# Patient Record
Sex: Male | Born: 1935 | Race: White | Hispanic: No | Smoking: Former smoker
Health system: Southern US, Community
[De-identification: ages and names within clinical notes are randomized; demographics above are authoritative.]

## PROBLEM LIST (undated history)

## (undated) DIAGNOSIS — H409 Unspecified glaucoma: Secondary | ICD-10-CM

## (undated) DIAGNOSIS — N189 Chronic kidney disease, unspecified: Secondary | ICD-10-CM

## (undated) DIAGNOSIS — E119 Type 2 diabetes mellitus without complications: Secondary | ICD-10-CM

## (undated) DIAGNOSIS — I639 Cerebral infarction, unspecified: Secondary | ICD-10-CM

## (undated) DIAGNOSIS — H5440 Blindness, one eye, unspecified eye: Secondary | ICD-10-CM

## (undated) DIAGNOSIS — G629 Polyneuropathy, unspecified: Secondary | ICD-10-CM

## (undated) DIAGNOSIS — H269 Unspecified cataract: Secondary | ICD-10-CM

## (undated) DIAGNOSIS — I10 Essential (primary) hypertension: Secondary | ICD-10-CM

## (undated) DIAGNOSIS — C642 Malignant neoplasm of left kidney, except renal pelvis: Secondary | ICD-10-CM

## (undated) DIAGNOSIS — M1990 Unspecified osteoarthritis, unspecified site: Secondary | ICD-10-CM

## (undated) DIAGNOSIS — K5900 Constipation, unspecified: Secondary | ICD-10-CM

## (undated) HISTORY — PX: THYROID SURGERY: SHX805

## (undated) HISTORY — PX: CHOLECYSTECTOMY: SHX55

## (undated) HISTORY — PX: TONSILLECTOMY: SUR1361

---

## 1981-01-27 DIAGNOSIS — C73 Malignant neoplasm of thyroid gland: Secondary | ICD-10-CM | POA: Insufficient documentation

## 1998-09-11 DIAGNOSIS — E7800 Pure hypercholesterolemia, unspecified: Secondary | ICD-10-CM | POA: Insufficient documentation

## 1999-01-28 DIAGNOSIS — K635 Polyp of colon: Secondary | ICD-10-CM | POA: Insufficient documentation

## 2003-07-19 DIAGNOSIS — E1141 Type 2 diabetes mellitus with diabetic mononeuropathy: Secondary | ICD-10-CM | POA: Insufficient documentation

## 2005-07-09 DIAGNOSIS — E291 Testicular hypofunction: Secondary | ICD-10-CM | POA: Insufficient documentation

## 2006-06-17 DIAGNOSIS — E559 Vitamin D deficiency, unspecified: Secondary | ICD-10-CM | POA: Insufficient documentation

## 2011-07-29 DIAGNOSIS — H269 Unspecified cataract: Secondary | ICD-10-CM | POA: Insufficient documentation

## 2011-07-29 DIAGNOSIS — H401190 Primary open-angle glaucoma, unspecified eye, stage unspecified: Secondary | ICD-10-CM | POA: Insufficient documentation

## 2015-07-28 DIAGNOSIS — D099 Carcinoma in situ, unspecified: Secondary | ICD-10-CM | POA: Insufficient documentation

## 2016-03-21 DIAGNOSIS — I5020 Unspecified systolic (congestive) heart failure: Secondary | ICD-10-CM | POA: Insufficient documentation

## 2018-07-22 DIAGNOSIS — N400 Enlarged prostate without lower urinary tract symptoms: Secondary | ICD-10-CM | POA: Insufficient documentation

## 2018-07-22 DIAGNOSIS — N2889 Other specified disorders of kidney and ureter: Secondary | ICD-10-CM | POA: Insufficient documentation

## 2018-07-22 DIAGNOSIS — I129 Hypertensive chronic kidney disease with stage 1 through stage 4 chronic kidney disease, or unspecified chronic kidney disease: Secondary | ICD-10-CM | POA: Insufficient documentation

## 2019-08-12 ENCOUNTER — Inpatient Hospital Stay (HOSPITAL_COMMUNITY)
Admission: EM | Admit: 2019-08-12 | Discharge: 2019-08-22 | DRG: 493 | Disposition: A | Discharge status: Skilled Nursing Facility | Payer: Medicare Other | Attending: Internal Medicine | Admitting: Internal Medicine

## 2019-08-12 ENCOUNTER — Encounter (HOSPITAL_COMMUNITY): Payer: Self-pay | Admitting: Anesthesiology

## 2019-08-12 ENCOUNTER — Emergency Department (HOSPITAL_COMMUNITY): Payer: Medicare Other

## 2019-08-12 ENCOUNTER — Encounter (HOSPITAL_COMMUNITY): Payer: Self-pay

## 2019-08-12 ENCOUNTER — Encounter (HOSPITAL_COMMUNITY)
Admission: EM | Disposition: A | Discharge status: Skilled Nursing Facility | Payer: Self-pay | Source: Home / Self Care | Attending: Internal Medicine

## 2019-08-12 ENCOUNTER — Other Ambulatory Visit: Payer: Self-pay

## 2019-08-12 ENCOUNTER — Inpatient Hospital Stay (HOSPITAL_COMMUNITY): Payer: Medicare Other

## 2019-08-12 DIAGNOSIS — E1121 Type 2 diabetes mellitus with diabetic nephropathy: Secondary | ICD-10-CM | POA: Diagnosis not present

## 2019-08-12 DIAGNOSIS — S82891A Other fracture of right lower leg, initial encounter for closed fracture: Secondary | ICD-10-CM | POA: Diagnosis not present

## 2019-08-12 DIAGNOSIS — Z85528 Personal history of other malignant neoplasm of kidney: Secondary | ICD-10-CM | POA: Diagnosis not present

## 2019-08-12 DIAGNOSIS — H409 Unspecified glaucoma: Secondary | ICD-10-CM | POA: Diagnosis present

## 2019-08-12 DIAGNOSIS — E1129 Type 2 diabetes mellitus with other diabetic kidney complication: Secondary | ICD-10-CM | POA: Diagnosis present

## 2019-08-12 DIAGNOSIS — S82841A Displaced bimalleolar fracture of right lower leg, initial encounter for closed fracture: Secondary | ICD-10-CM | POA: Diagnosis present

## 2019-08-12 DIAGNOSIS — Z833 Family history of diabetes mellitus: Secondary | ICD-10-CM | POA: Diagnosis not present

## 2019-08-12 DIAGNOSIS — Z20822 Contact with and (suspected) exposure to covid-19: Secondary | ICD-10-CM | POA: Diagnosis present

## 2019-08-12 DIAGNOSIS — Z794 Long term (current) use of insulin: Secondary | ICD-10-CM

## 2019-08-12 DIAGNOSIS — E0841 Diabetes mellitus due to underlying condition with diabetic mononeuropathy: Secondary | ICD-10-CM | POA: Diagnosis not present

## 2019-08-12 DIAGNOSIS — Z01818 Encounter for other preprocedural examination: Secondary | ICD-10-CM | POA: Diagnosis present

## 2019-08-12 DIAGNOSIS — D649 Anemia, unspecified: Secondary | ICD-10-CM | POA: Diagnosis present

## 2019-08-12 DIAGNOSIS — E1140 Type 2 diabetes mellitus with diabetic neuropathy, unspecified: Secondary | ICD-10-CM | POA: Diagnosis present

## 2019-08-12 DIAGNOSIS — I129 Hypertensive chronic kidney disease with stage 1 through stage 4 chronic kidney disease, or unspecified chronic kidney disease: Secondary | ICD-10-CM | POA: Diagnosis present

## 2019-08-12 DIAGNOSIS — E11649 Type 2 diabetes mellitus with hypoglycemia without coma: Secondary | ICD-10-CM | POA: Diagnosis not present

## 2019-08-12 DIAGNOSIS — Z87891 Personal history of nicotine dependence: Secondary | ICD-10-CM

## 2019-08-12 DIAGNOSIS — E1122 Type 2 diabetes mellitus with diabetic chronic kidney disease: Secondary | ICD-10-CM | POA: Diagnosis present

## 2019-08-12 DIAGNOSIS — Z8673 Personal history of transient ischemic attack (TIA), and cerebral infarction without residual deficits: Secondary | ICD-10-CM

## 2019-08-12 DIAGNOSIS — S82899A Other fracture of unspecified lower leg, initial encounter for closed fracture: Secondary | ICD-10-CM | POA: Diagnosis present

## 2019-08-12 DIAGNOSIS — N184 Chronic kidney disease, stage 4 (severe): Secondary | ICD-10-CM | POA: Diagnosis present

## 2019-08-12 DIAGNOSIS — I10 Essential (primary) hypertension: Secondary | ICD-10-CM | POA: Diagnosis not present

## 2019-08-12 DIAGNOSIS — R001 Bradycardia, unspecified: Secondary | ICD-10-CM | POA: Diagnosis present

## 2019-08-12 DIAGNOSIS — Y92009 Unspecified place in unspecified non-institutional (private) residence as the place of occurrence of the external cause: Secondary | ICD-10-CM | POA: Diagnosis not present

## 2019-08-12 DIAGNOSIS — W010XXA Fall on same level from slipping, tripping and stumbling without subsequent striking against object, initial encounter: Secondary | ICD-10-CM | POA: Diagnosis present

## 2019-08-12 DIAGNOSIS — E1165 Type 2 diabetes mellitus with hyperglycemia: Secondary | ICD-10-CM | POA: Diagnosis present

## 2019-08-12 DIAGNOSIS — K5900 Constipation, unspecified: Secondary | ICD-10-CM | POA: Diagnosis present

## 2019-08-12 DIAGNOSIS — Z79899 Other long term (current) drug therapy: Secondary | ICD-10-CM

## 2019-08-12 HISTORY — DX: Essential (primary) hypertension: I10

## 2019-08-12 HISTORY — DX: Unspecified glaucoma: H40.9

## 2019-08-12 HISTORY — DX: Constipation, unspecified: K59.00

## 2019-08-12 HISTORY — DX: Cerebral infarction, unspecified: I63.9

## 2019-08-12 HISTORY — DX: Blindness, one eye, unspecified eye: H54.40

## 2019-08-12 HISTORY — DX: Unspecified cataract: H26.9

## 2019-08-12 HISTORY — DX: Malignant neoplasm of left kidney, except renal pelvis: C64.2

## 2019-08-12 HISTORY — DX: Chronic kidney disease, unspecified: N18.9

## 2019-08-12 HISTORY — DX: Unspecified osteoarthritis, unspecified site: M19.90

## 2019-08-12 HISTORY — DX: Type 2 diabetes mellitus without complications: E11.9

## 2019-08-12 HISTORY — DX: Polyneuropathy, unspecified: G62.9

## 2019-08-12 LAB — SARS CORONAVIRUS 2 BY RT PCR (HOSPITAL ORDER, PERFORMED IN ~~LOC~~ HOSPITAL LAB): SARS Coronavirus 2: NEGATIVE

## 2019-08-12 LAB — BASIC METABOLIC PANEL
Anion gap: 8 (ref 5–15)
BUN: 43 mg/dL — ABNORMAL HIGH (ref 8–23)
CO2: 22 mmol/L (ref 22–32)
Calcium: 9.3 mg/dL (ref 8.9–10.3)
Chloride: 107 mmol/L (ref 98–111)
Creatinine, Ser: 1.95 mg/dL — ABNORMAL HIGH (ref 0.61–1.24)
GFR calc Af Amer: 34 mL/min — ABNORMAL LOW (ref >60)
GFR calc non Af Amer: 29 mL/min — ABNORMAL LOW (ref >60)
Glucose, Bld: 200 mg/dL — ABNORMAL HIGH (ref 70–99)
Potassium: 4.7 mmol/L (ref 3.5–5.1)
Sodium: 137 mmol/L (ref 135–145)

## 2019-08-12 LAB — CBC
HCT: 35.6 % — ABNORMAL LOW (ref 39.0–52.0)
Hemoglobin: 12.5 g/dL — ABNORMAL LOW (ref 13.0–17.0)
MCH: 32 pg (ref 26.0–34.0)
MCHC: 35.1 g/dL (ref 30.0–36.0)
MCV: 91 fL (ref 80.0–100.0)
Platelets: 189 10*3/uL (ref 150–400)
RBC: 3.91 MIL/uL — ABNORMAL LOW (ref 4.22–5.81)
RDW: 14.1 % (ref 11.5–15.5)
WBC: 11.2 10*3/uL — ABNORMAL HIGH (ref 4.0–10.5)
nRBC: 0 % (ref 0.0–0.2)

## 2019-08-12 LAB — GLUCOSE, CAPILLARY
Glucose-Capillary: 57 mg/dL — ABNORMAL LOW (ref 70–99)
Glucose-Capillary: 122 mg/dL — ABNORMAL HIGH (ref 70–99)
Glucose-Capillary: 192 mg/dL — ABNORMAL HIGH (ref 70–99)

## 2019-08-12 LAB — CBG MONITORING, ED
Glucose-Capillary: 122 mg/dL — ABNORMAL HIGH (ref 70–99)
Glucose-Capillary: 84 mg/dL (ref 70–99)

## 2019-08-12 IMAGING — DX DG CHEST 1V PORT
2 series · 2 of 2 positions shown · non-contrast
Comparison: None.

CLINICAL DATA: Preop ankle surgery

EXAM:
PORTABLE CHEST 1 VIEW

[chest ap (1 of 2)]
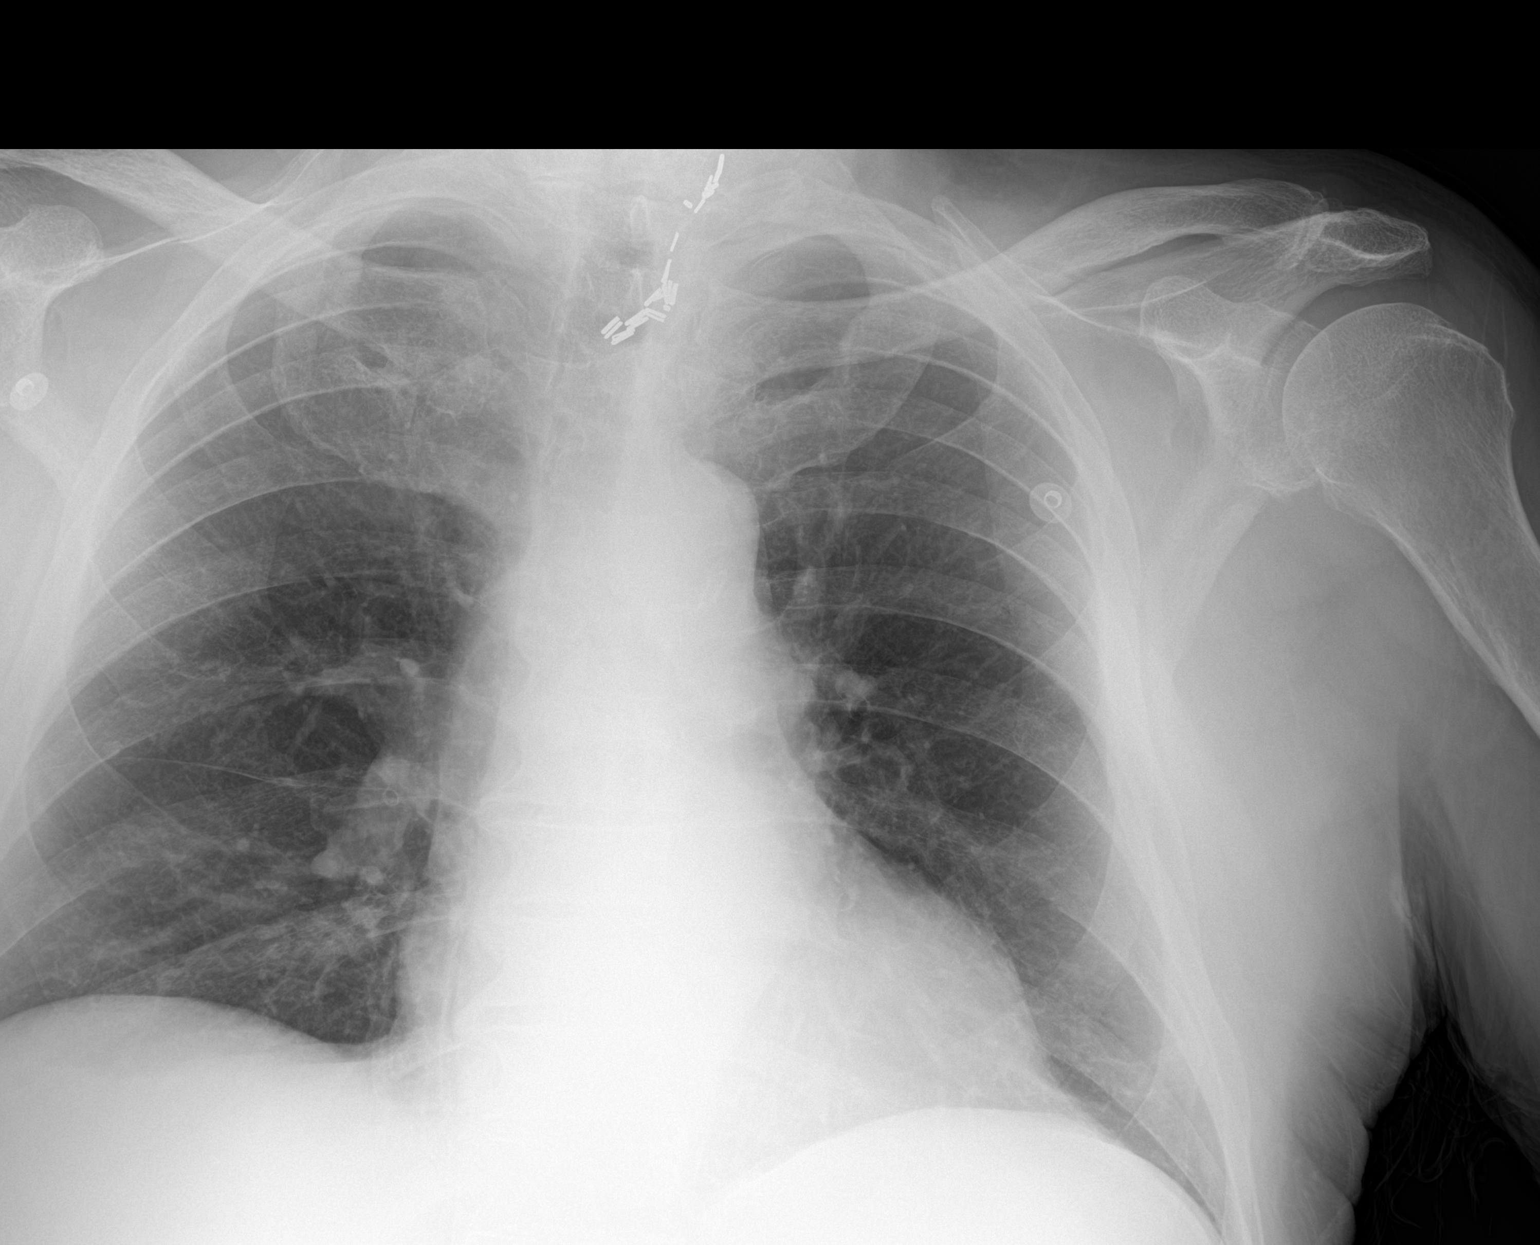

[chest ap (2 of 2)]
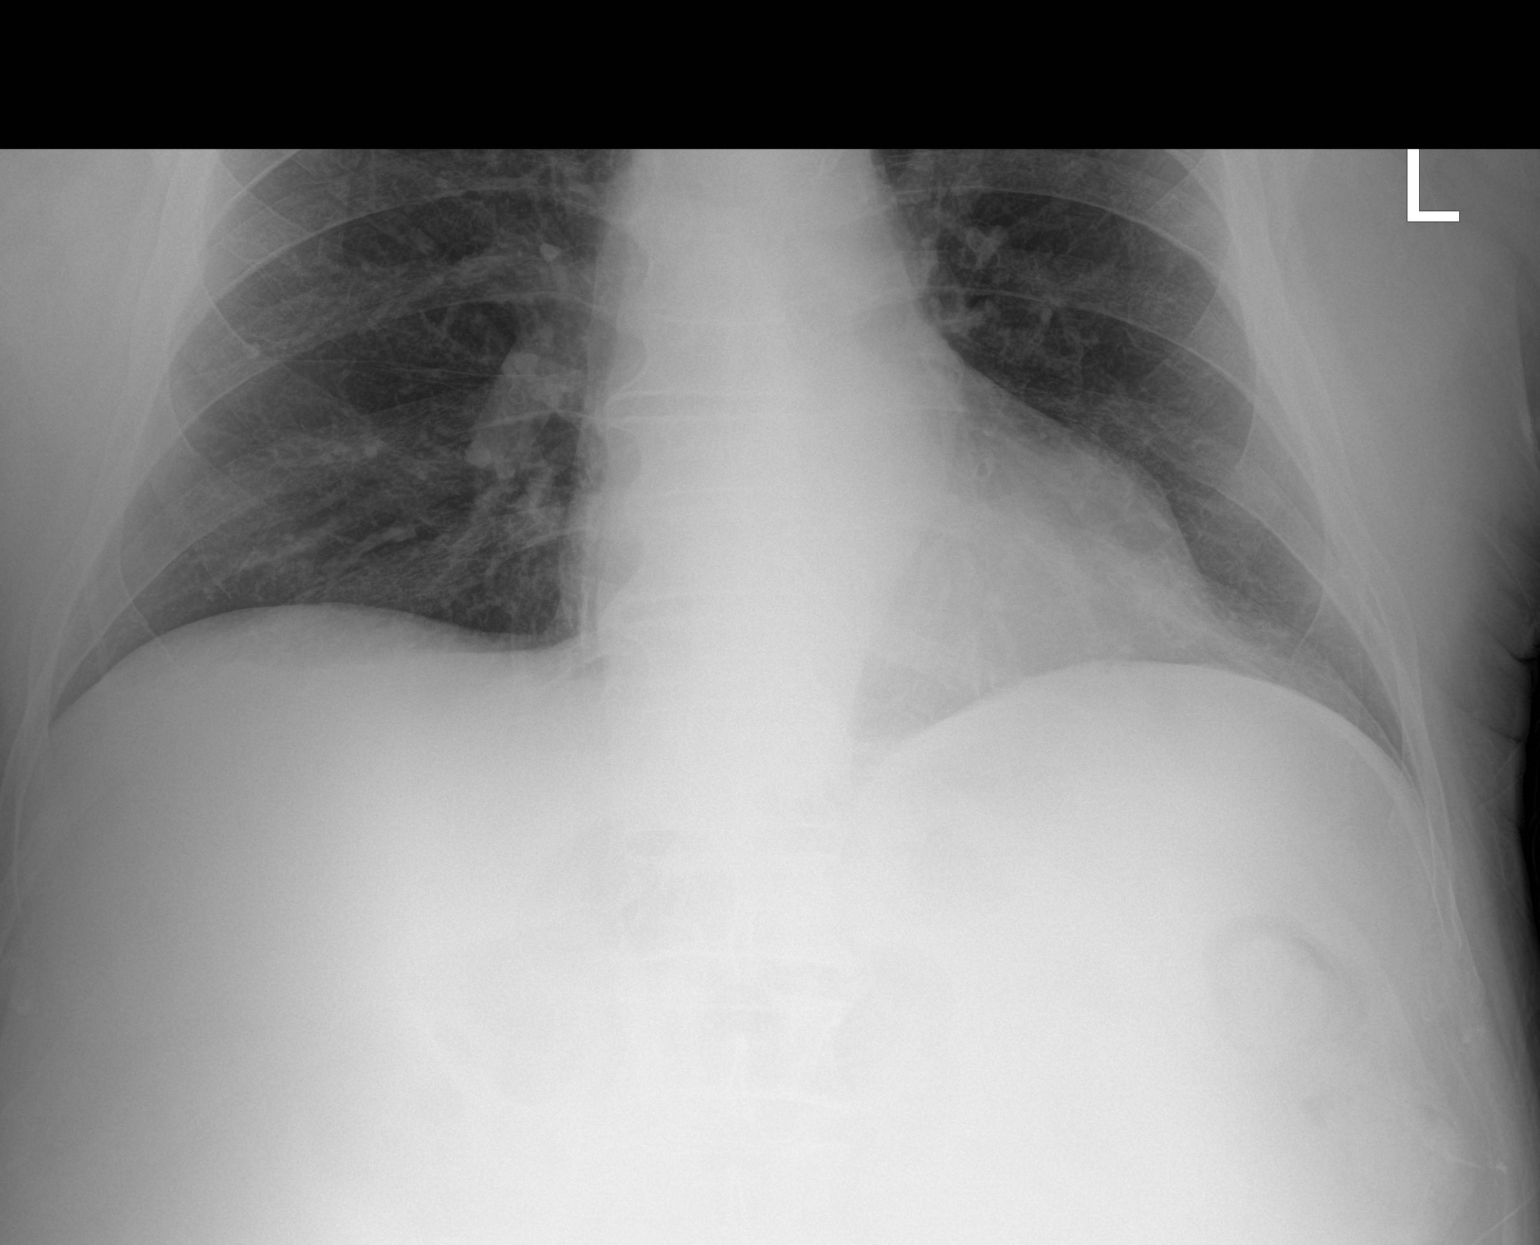

[2 of 2 positions shown; findings below may reference images not displayed]

FINDINGS: Mild left basilar scarring/atelectasis. Lungs are otherwise clear.
No pleural effusion or pneumothorax.

Heart is normal in size.

Surgical clips along the left neck.
IMPRESSION: No evidence of acute cardiopulmonary disease.

## 2019-08-12 IMAGING — CR DG ANKLE COMPLETE 3+V*R*
3 series · 3 of 3 positions shown · non-contrast
Comparison: None.

CLINICAL DATA: [AGE] male status post fall with pain.

EXAM:
RIGHT ANKLE - COMPLETE 3+ VIEW

[x ankle ap right]
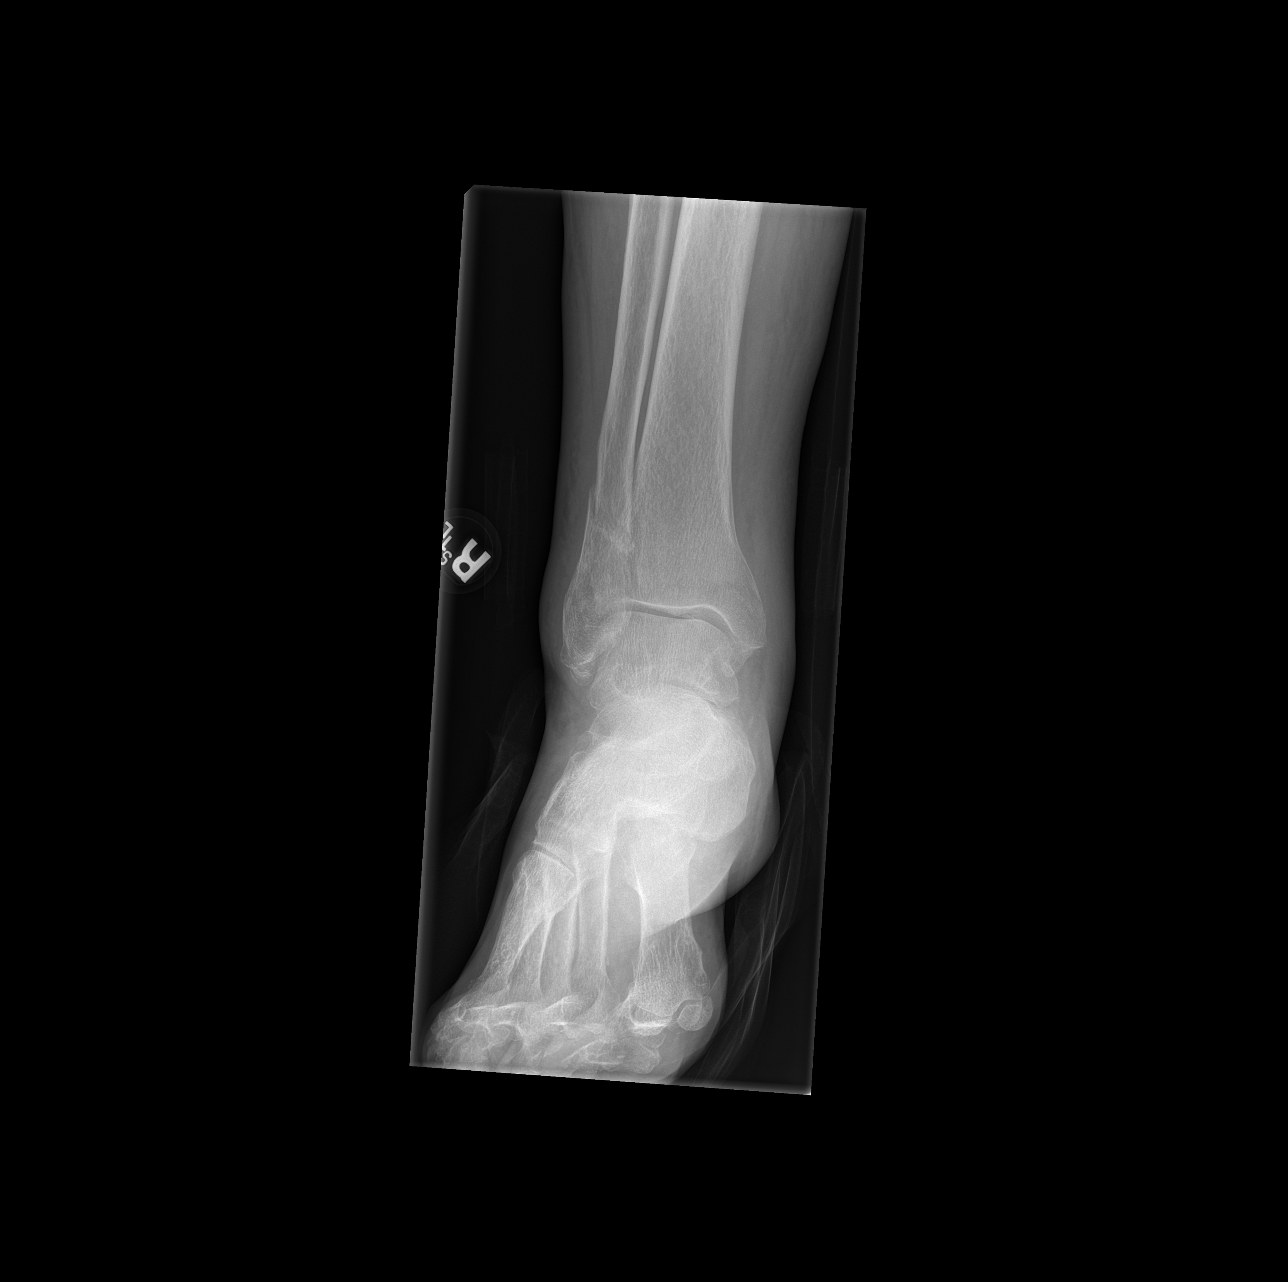

[x ankle obl right]
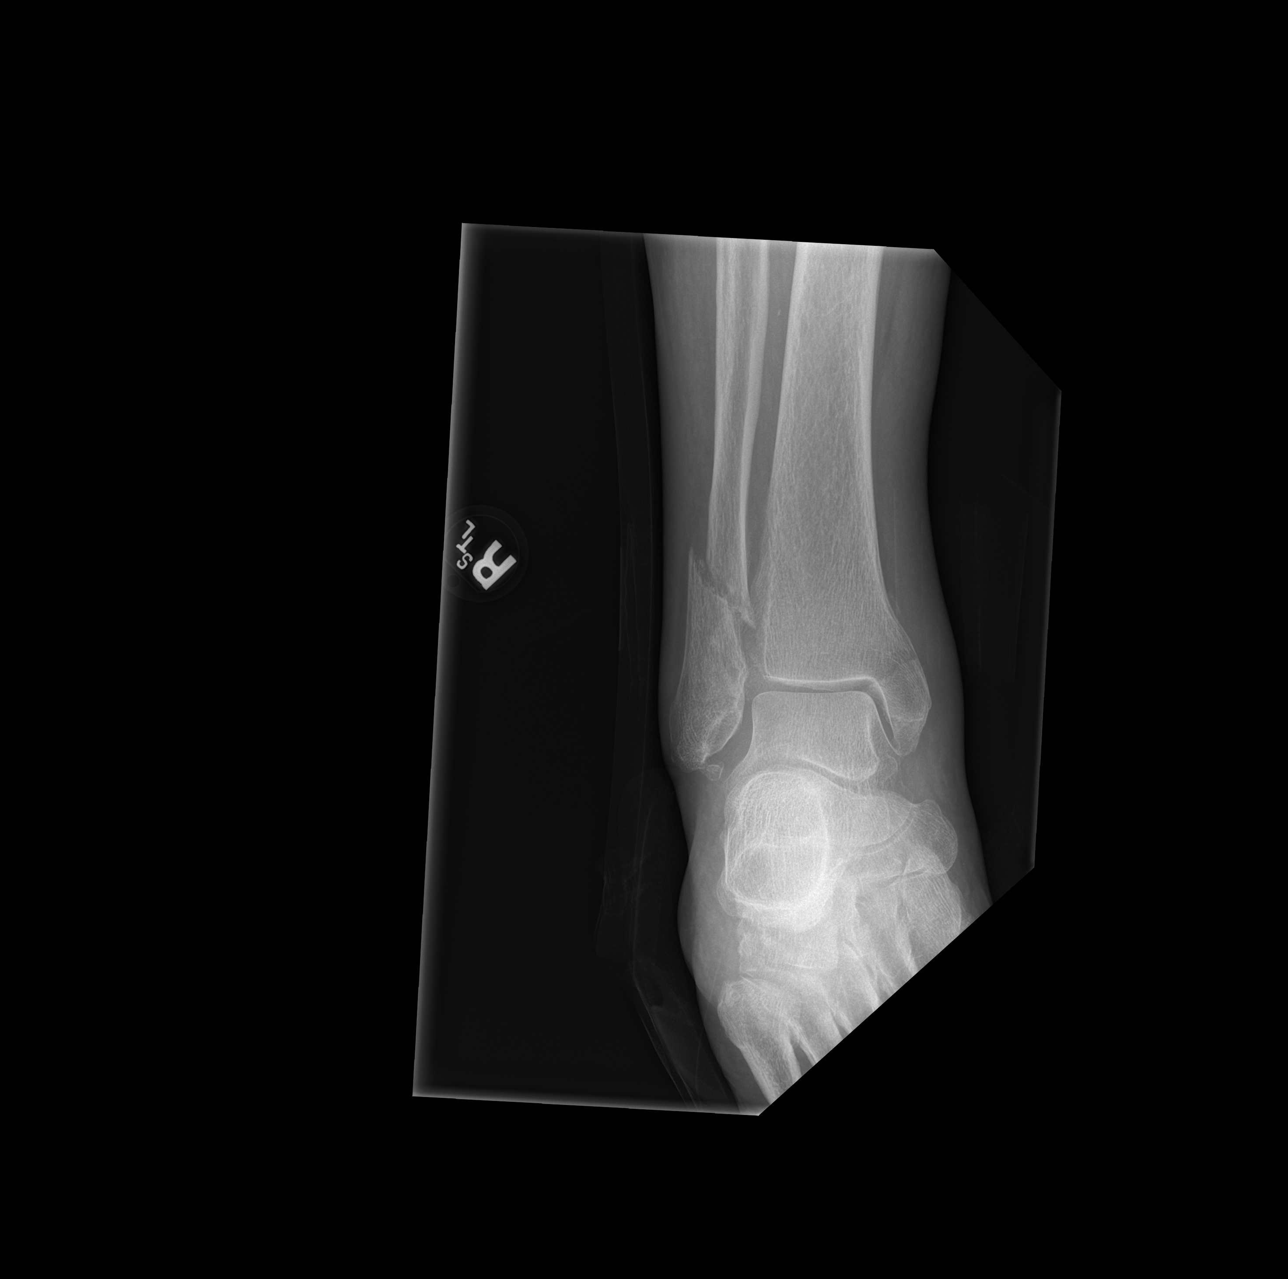

[x ankle lat right]
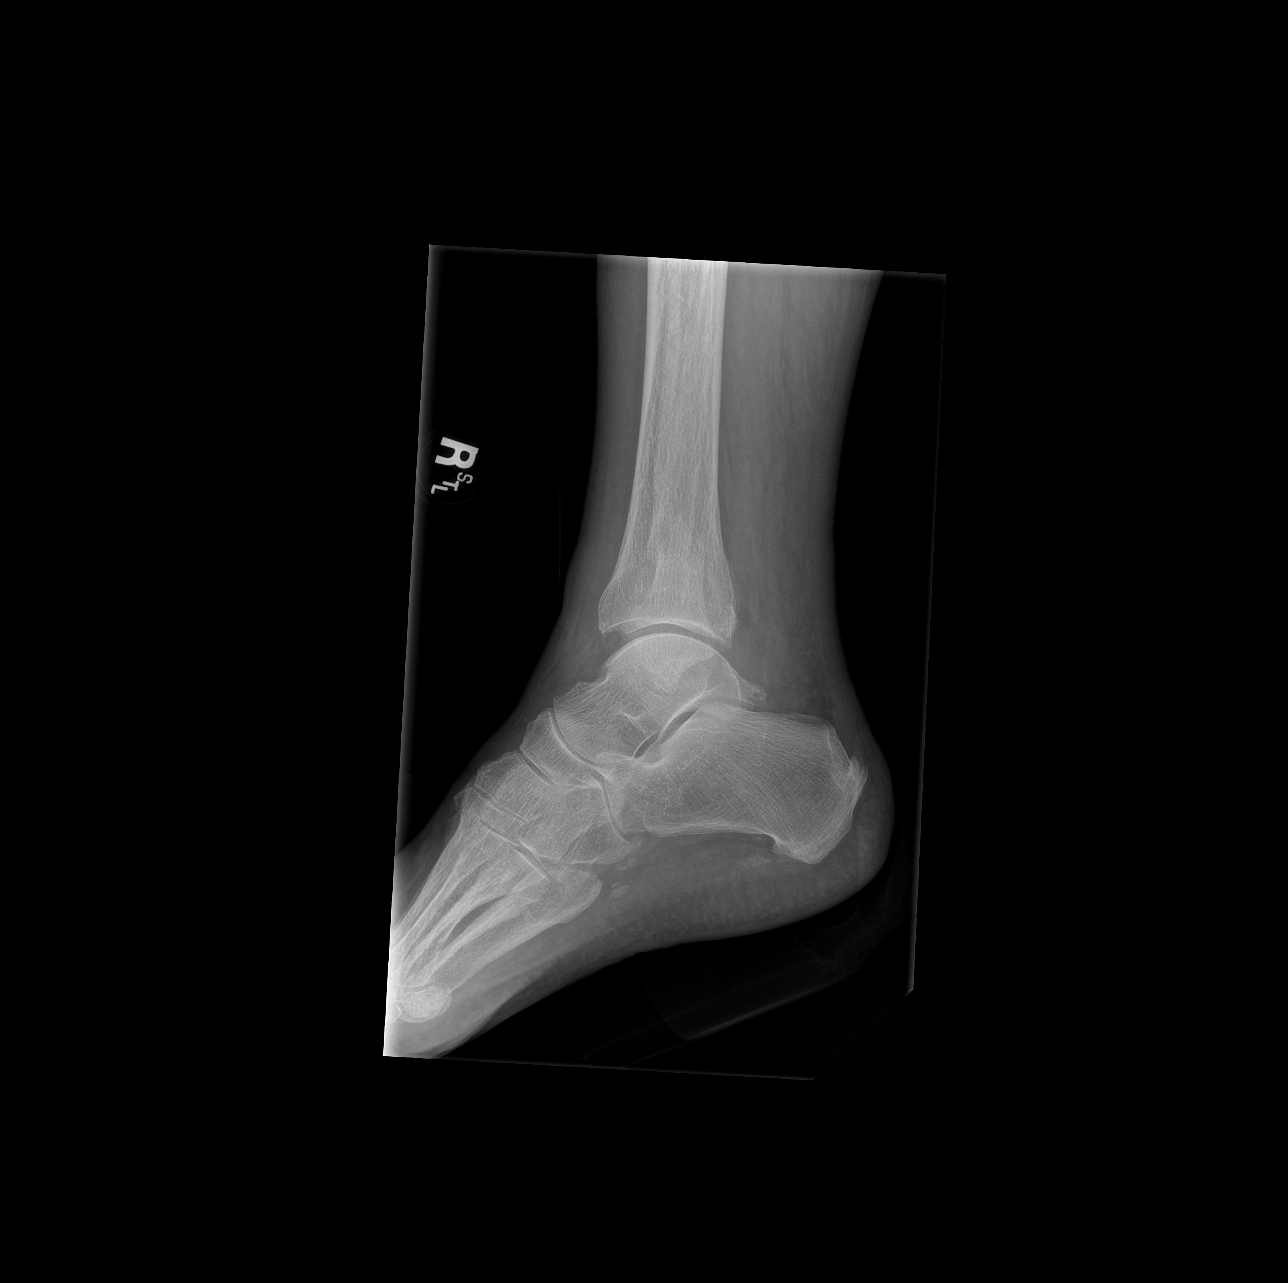

[3 of 3 positions shown; findings below may reference images not displayed]

FINDINGS: Oblique mildly comminuted fracture through the distal right fibula
metadiaphysis. Mild posterior and lateral displacement of the
fibula. Widening of the right lateral mortise joint space, as well
as the tibia fibular syndesmosis.

Superimposed more linear fracture through the medial malleolus,
nondisplaced. The posterior malleolus appears to remain intact.
Talar dome intact. Calcaneus and other visible bones of the right
foot appear intact.
IMPRESSION: 1. Mildly displaced and comminuted fracture of the distal right
fibula metadiaphysis with suspicion of injury to the tibiofibular
syndesmosis.
2. Nondisplaced fracture through the medial malleolus.

## 2019-08-12 SURGERY — OPEN REDUCTION INTERNAL FIXATION (ORIF) ANKLE FRACTURE
Anesthesia: General | Site: Ankle | Laterality: Right

## 2019-08-12 MED ORDER — OXYCODONE-ACETAMINOPHEN 5-325 MG PO TABS
1.0000 | ORAL_TABLET | Freq: Once | ORAL | Status: AC
  Administered 2019-08-12: 1 via ORAL
  Filled 2019-08-12: qty 1

## 2019-08-12 MED ORDER — ATORVASTATIN CALCIUM 80 MG PO TABS
80.0000 mg | ORAL_TABLET | Freq: Every day | ORAL | Status: DC
  Administered 2019-08-12 – 2019-08-21 (×10): 80 mg via ORAL
  Filled 2019-08-12 (×8): qty 1
  Filled 2019-08-12: qty 2
  Filled 2019-08-12: qty 1

## 2019-08-12 MED ORDER — FAMOTIDINE 20 MG PO TABS
20.0000 mg | ORAL_TABLET | Freq: Every day | ORAL | Status: DC
  Administered 2019-08-12 – 2019-08-21 (×10): 20 mg via ORAL
  Filled 2019-08-12 (×10): qty 1

## 2019-08-12 MED ORDER — SENNA 8.6 MG PO TABS
1.0000 | ORAL_TABLET | Freq: Every day | ORAL | Status: DC
  Administered 2019-08-12 – 2019-08-21 (×8): 8.6 mg via ORAL
  Filled 2019-08-12 (×10): qty 1

## 2019-08-12 MED ORDER — INSULIN ASPART 100 UNIT/ML ~~LOC~~ SOLN
0.0000 [IU] | SUBCUTANEOUS | Status: DC
  Administered 2019-08-12: 2 [IU] via SUBCUTANEOUS
  Administered 2019-08-13 (×2): 9 [IU] via SUBCUTANEOUS
  Administered 2019-08-13: 2 [IU] via SUBCUTANEOUS
  Administered 2019-08-13: 1 [IU] via SUBCUTANEOUS
  Administered 2019-08-13: 3 [IU] via SUBCUTANEOUS
  Administered 2019-08-14: 2 [IU] via SUBCUTANEOUS
  Administered 2019-08-14: 5 [IU] via SUBCUTANEOUS
  Administered 2019-08-14: 3 [IU] via SUBCUTANEOUS
  Administered 2019-08-14: 5 [IU] via SUBCUTANEOUS
  Administered 2019-08-14: 1 [IU] via SUBCUTANEOUS
  Administered 2019-08-14: 5 [IU] via SUBCUTANEOUS
  Administered 2019-08-15 (×3): 3 [IU] via SUBCUTANEOUS
  Administered 2019-08-15: 2 [IU] via SUBCUTANEOUS
  Administered 2019-08-15: 1 [IU] via SUBCUTANEOUS
  Administered 2019-08-16: 3 [IU] via SUBCUTANEOUS
  Administered 2019-08-16: 1 [IU] via SUBCUTANEOUS
  Administered 2019-08-16: 5 [IU] via SUBCUTANEOUS
  Administered 2019-08-16 – 2019-08-17 (×3): 2 [IU] via SUBCUTANEOUS
  Administered 2019-08-17: 5 [IU] via SUBCUTANEOUS
  Administered 2019-08-17: 1 [IU] via SUBCUTANEOUS
  Administered 2019-08-17 (×2): 5 [IU] via SUBCUTANEOUS
  Administered 2019-08-18: 3 [IU] via SUBCUTANEOUS
  Administered 2019-08-18: 1 [IU] via SUBCUTANEOUS
  Administered 2019-08-18 (×2): 3 [IU] via SUBCUTANEOUS
  Administered 2019-08-18: 2 [IU] via SUBCUTANEOUS
  Administered 2019-08-18: 5 [IU] via SUBCUTANEOUS
  Administered 2019-08-19: 2 [IU] via SUBCUTANEOUS
  Administered 2019-08-19: 3 [IU] via SUBCUTANEOUS
  Administered 2019-08-19: 2 [IU] via SUBCUTANEOUS
  Administered 2019-08-19: 5 [IU] via SUBCUTANEOUS
  Administered 2019-08-19: 2 [IU] via SUBCUTANEOUS
  Administered 2019-08-20: 3 [IU] via SUBCUTANEOUS
  Administered 2019-08-20: 1 [IU] via SUBCUTANEOUS
  Administered 2019-08-20 (×4): 2 [IU] via SUBCUTANEOUS
  Administered 2019-08-21 (×2): 3 [IU] via SUBCUTANEOUS
  Administered 2019-08-21: 2 [IU] via SUBCUTANEOUS
  Administered 2019-08-21 (×2): 3 [IU] via SUBCUTANEOUS
  Administered 2019-08-22 (×2): 2 [IU] via SUBCUTANEOUS
  Administered 2019-08-22: 1 [IU] via SUBCUTANEOUS
  Administered 2019-08-22: 3 [IU] via SUBCUTANEOUS
  Filled 2019-08-12: qty 0.09

## 2019-08-12 MED ORDER — ONDANSETRON HCL 4 MG/2ML IJ SOLN
4.0000 mg | Freq: Once | INTRAMUSCULAR | Status: AC
  Administered 2019-08-12: 4 mg via INTRAVENOUS
  Filled 2019-08-12: qty 2

## 2019-08-12 MED ORDER — LEVOTHYROXINE SODIUM 25 MCG PO TABS
125.0000 ug | ORAL_TABLET | Freq: Every day | ORAL | Status: DC
  Administered 2019-08-13 – 2019-08-22 (×10): 125 ug via ORAL
  Filled 2019-08-12 (×10): qty 1

## 2019-08-12 MED ORDER — TIMOLOL MALEATE 0.5 % OP SOLN
1.0000 [drp] | Freq: Two times a day (BID) | OPHTHALMIC | Status: DC
  Administered 2019-08-12 – 2019-08-22 (×19): 1 [drp] via OPHTHALMIC
  Filled 2019-08-12: qty 5

## 2019-08-12 MED ORDER — MORPHINE SULFATE (PF) 2 MG/ML IV SOLN
0.5000 mg | INTRAVENOUS | Status: DC | PRN
  Administered 2019-08-12 (×3): 0.5 mg via INTRAVENOUS
  Filled 2019-08-12 (×3): qty 1

## 2019-08-12 MED ORDER — DOCUSATE SODIUM 100 MG PO CAPS
100.0000 mg | ORAL_CAPSULE | Freq: Two times a day (BID) | ORAL | Status: DC
  Administered 2019-08-12: 100 mg via ORAL
  Filled 2019-08-12: qty 1

## 2019-08-12 MED ORDER — AMLODIPINE BESYLATE 5 MG PO TABS
5.0000 mg | ORAL_TABLET | Freq: Every day | ORAL | Status: DC
  Administered 2019-08-12 – 2019-08-14 (×3): 5 mg via ORAL
  Filled 2019-08-12 (×3): qty 1

## 2019-08-12 MED ORDER — TIMOLOL HEMIHYDRATE 0.5 % OP SOLN: 1.0000 [drp] | Freq: Two times a day (BID) | OPHTHALMIC | Status: DC

## 2019-08-12 MED ORDER — HYDRALAZINE HCL 20 MG/ML IJ SOLN: 10.0000 mg | INTRAMUSCULAR | Status: DC | PRN

## 2019-08-12 MED ORDER — SODIUM CHLORIDE 0.9 % IV SOLN: INTRAVENOUS | Status: DC

## 2019-08-12 MED ORDER — HYDROCODONE-ACETAMINOPHEN 5-325 MG PO TABS
1.0000 | ORAL_TABLET | Freq: Four times a day (QID) | ORAL | Status: DC | PRN
  Administered 2019-08-12 – 2019-08-21 (×19): 1 via ORAL
  Filled 2019-08-12 (×20): qty 1

## 2019-08-12 NOTE — ED Triage Notes (Signed)
Pt BIB [facility] for a mechanical fall where he sustained a R ankle injury. R foot and ankle splinted by EMS. R ankle swollen.They gave 50 mcg of fentanyl and he became hypotensive. Received 579mL of NS. They gave the other half of fentanyl while they were checking in. Also received 4 mg of zofran. 20g in L forearm.

## 2019-08-12 NOTE — ED Provider Notes (Signed)
TIME SEEN: 4:24 AM  CHIEF COMPLAINT: Right ankle injury  HPI: Patient is a 86 year old male with unknown past medical history who presents to the emergency department by EMS after he slipped and fell on carpeted floor just prior to arrival.  States he just moved to this area and moved into a new home and the carpet is very slick.  He states that he slipped injuring his right ankle.  He did not hit his head or lose consciousness.  Denies neck or back pain.  States he is not on blood thinners.  States he cannot remember what medical problems he has.  States he normally ambulates at home without assistance.  Does have a walker at home.   Contacted patient's daughter.  Patient has history of hypertension, diabetes, chronic kidney disease, stroke.  ROS: See HPI Constitutional: no fever  Eyes: no drainage  ENT: no runny nose   Cardiovascular:  no chest pain  Resp: no SOB  GI: no vomiting GU: no dysuria Integumentary: no rash  Allergy: no hives  Musculoskeletal: no leg swelling  Neurological: no slurred speech ROS otherwise negative  PAST MEDICAL HISTORY/PAST SURGICAL HISTORY:  Past Medical History:  Diagnosis Date  . Chronic kidney disease   . Diabetes mellitus without complication ([place])   . Hypertension   . Stroke [facility])     MEDICATIONS:  Prior to Admission medications   Not on File    ALLERGIES:  No Known Allergies  SOCIAL HISTORY:  Social History   Tobacco Use  . Smoking status: Not on file  Substance Use Topics  . Alcohol use: Not on file    FAMILY HISTORY: History reviewed. No pertinent family history.  EXAM: BP 112/63 (BP Location: Right Arm)   Pulse 63   Temp (!) 97.3 F (36.3 C) (Oral)   Resp 16   Ht 5\' 10"  (1.778 m)   Wt 100.7 kg   SpO2 96%   BMI 31.85 kg/m  CONSTITUTIONAL: Alert and oriented and responds appropriately to questions.  Appears uncomfortable.  Elderly. HEAD: Normocephalic; atraumatic EYES: Conjunctivae clear, PERRL, EOMI ENT: normal  nose; no rhinorrhea; moist mucous membranes; pharynx without lesions noted; no dental injury; no septal hematoma NECK: Supple, no meningismus, no LAD; no midline spinal tenderness, step-off or deformity; trachea midline CARD: RRR; S1 and S2 appreciated; no murmurs, no clicks, no rubs, no gallops RESP: Normal chest excursion without splinting or tachypnea; breath sounds clear and equal bilaterally; no wheezes, no rhonchi, no rales; no hypoxia or respiratory distress CHEST:  chest wall stable, no crepitus or ecchymosis or deformity, nontender to palpation; no flail chest ABD/GI: Normal bowel sounds; non-distended; soft, non-tender, no rebound, no guarding; no ecchymosis or other lesions noted PELVIS:  stable, nontender to palpation BACK:  The back appears normal and is non-tender to palpation, there is no CVA tenderness; no midline spinal tenderness, step-off or deformity EXT: Patient is tender to palpation diffusely over the right ankle with soft tissue swelling.  He is a 2+ right DP pulse.  No tenderness over the right foot or proximal tibia and fibula.  No pain over the right knee or hip.  Normal sensation throughout the right lower extremity.  Compartments in the right leg are soft. SKIN: Normal color for age and race; warm NEURO: Moves all extremities equally, no facial asymmetry, normal speech, reports normal sensation in the right lower extremity PSYCH: The patient's mood and manner are appropriate. Grooming and personal hygiene are appropriate.  MEDICAL DECISION MAKING: Patient here  after mechanical fall.  X-ray shows mildly displaced and comminuted fracture of the distal right fibula with suspicion of injury to the tibiofibular stenosis.  There is also a nondisplaced fracture through the medial malleolus.  He does not have a local orthopedist.  States he just establish care with [facility] primary care physicians.  Will discuss with orthopedics on call.  ED PROGRESS: Discussed with Dr. Griffin Basil  on-call for orthopedics.  He states patient can be admitted to the medical service and taken to the operating room in the next 1 to 2 days but would like the patient transferred to [facility].  Will discuss with hospitalist.  Patient is more comfortable with this plan then the plan to potentially go home given he would be nonweightbearing and he states he is still having significant pain and has issues with vertigo and worries about trying to get around on crutches or with a walker at home.   5:40 AM Discussed patient's case with hospitalist, Dr. Hal Hope.  I have recommended admission and patient (and family if present) agree with this plan. Admitting physician will place admission orders.   I reviewed all nursing notes, vitals, pertinent previous records and reviewed/interpreted all EKGs, lab and urine results, imaging (as available).  Labs show no significant abnormality other than creatinine of 1.95 but he has known history of chronic kidney disease.  No old for comparison.  Blood glucose here 200 but no DKA.  Patient placed in short leg posterior splint with stirrups.   SPLINT APPLICATION Date/Time: 8:82 AM Authorized by: Cyril Mourning Mahmud Keithly Consent: Verbal consent obtained. Risks and benefits: risks, benefits and alternatives were discussed Consent given by: patient Splint applied by: orthopedic technician Location details: Right lower extremity Splint type: Posterior short leg splint with stirrups Supplies used: Ortho-Glass, Ace wrap, padding Post-procedure: The splinted body part was neurovascularly unchanged following the procedure. Patient tolerance: Patient tolerated the procedure well with no immediate complications.    Aloysius Heinle Langdon was evaluated in Emergency Department on 08/12/2019 for the symptoms described in the history of present illness. He was evaluated in the context of the global COVID-19 pandemic, which necessitated consideration that the patient might be at risk for  infection with the SARS-CoV-2 virus that causes COVID-19. Institutional protocols and algorithms that pertain to the evaluation of patients at risk for COVID-19 are in a state of rapid change based on information released by regulatory bodies including the CDC and federal and state organizations. These policies and algorithms were followed during the patient's care in the ED.       Reinhold Rickey, Delice Bison, DO 08/12/19 [phone]

## 2019-08-12 NOTE — ED Notes (Signed)
[facility] contacted regarding patient transport.

## 2019-08-12 NOTE — ED Notes (Signed)
Ortho tech called from [facility].

## 2019-08-12 NOTE — H&P (Signed)
History and Physical    Martin Richardson ZSW:[id number] DOB: 01-09-1926 DOA: 08/12/2019  PCP: [place], [place]  Patient coming from: Home.  Chief Complaint: Fall.  HPI: Martin Richardson is a 86 y.o. male with history of diabetes mellitus type 2, chronic kidney disease stage IV, anemia, hypertension history of stroke had a fall at home when he was walking to the bathroom.  Patient states he tripped on a rug and fell onto the floor.  Did not hit his head or lose consciousness.  Denies any chest pain or shortness of breath.  Patient just recently moved from [place].  ED Course: In the ER x-rays revealed right ankle fracture.  On-call orthopedic surgeon Dr. Griffin Basil was consulted who requested transfer to [facility] for likely surgical management.  Labs are remarkable for creatinine of 1.9 blood glucose 200 hemoglobin 12.5 WBC 9.2.  Review of Systems: As per HPI, rest all negative.   Past Medical History:  Diagnosis Date  . Chronic kidney disease   . Diabetes mellitus without complication ([place])   . Hypertension   . Stroke [facility])     Past Surgical History:  Procedure Laterality Date  . CHOLECYSTECTOMY    . THYROID SURGERY       reports that he has quit smoking. He has quit using smokeless tobacco. He reports that he does not drink alcohol and does not use drugs.  No Known Allergies  Family History  Problem Relation Age of Onset  . Diabetes Mellitus II Mother     Prior to Admission medications   Not on File    Physical Exam: Constitutional: Moderately built and nourished. Vitals:   08/12/19 0328  BP: 112/63  Pulse: 63  Resp: 16  Temp: (!) 97.3 F (36.3 C)  TempSrc: Oral  SpO2: 96%  Weight: 100.7 kg  Height: 5\' 10"  (1.778 m)   Eyes: Anicteric no pallor. ENMT: No discharge from the ears eyes nose or mouth. Neck: No mass felt.  No neck rigidity. Respiratory: No rhonchi or crepitations. Cardiovascular: S1-S2 heard. Abdomen: Soft nontender bowel  sounds present. Musculoskeletal: Right leg is in splint. Skin: No rash. Neurologic: Alert awake oriented to time place and person.  Moves all extremities. Psychiatric: Appears normal but normal affect.   Labs on Admission: I have personally reviewed following labs and imaging studies  CBC: Recent Labs  Lab 08/12/19 0540  WBC 11.2*  HGB 12.5*  HCT 35.6*  MCV 91.0  PLT 322   Basic Metabolic Panel: Recent Labs  Lab 08/12/19 0540  NA 137  K 4.7  CL 107  CO2 22  GLUCOSE 200*  BUN 43*  CREATININE 1.95*  CALCIUM 9.3   GFR: Estimated Creatinine Clearance: 28.8 mL/min (A) (by C-G formula based on SCr of 1.95 mg/dL (H)). Liver Function Tests: No results for input(s): AST, ALT, ALKPHOS, BILITOT, PROT, ALBUMIN in the last 168 hours. No results for input(s): LIPASE, AMYLASE in the last 168 hours. No results for input(s): AMMONIA in the last 168 hours. Coagulation Profile: No results for input(s): INR, PROTIME in the last 168 hours. Cardiac Enzymes: No results for input(s): CKTOTAL, CKMB, CKMBINDEX, TROPONINI in the last 168 hours. BNP (last 3 results) No results for input(s): PROBNP in the last 8760 hours. HbA1C: No results for input(s): HGBA1C in the last 72 hours. CBG: No results for input(s): GLUCAP in the last 168 hours. Lipid Profile: No results for input(s): CHOL, HDL, LDLCALC, TRIG, CHOLHDL, LDLDIRECT in the last 72  hours. Thyroid Function Tests: No results for input(s): TSH, T4TOTAL, FREET4, T3FREE, THYROIDAB in the last 72 hours. Anemia Panel: No results for input(s): VITAMINB12, FOLATE, FERRITIN, TIBC, IRON, RETICCTPCT in the last 72 hours. Urine analysis: No results found for: COLORURINE, APPEARANCEUR, LABSPEC, PHURINE, GLUCOSEU, HGBUR, BILIRUBINUR, KETONESUR, PROTEINUR, UROBILINOGEN, NITRITE, LEUKOCYTESUR Sepsis Labs: @LABRCNTIP (procalcitonin:4,lacticidven:4) )No results found for this or any previous visit (from the past 240 hour(s)).   Radiological Exams  on Admission: DG Ankle Complete Right  Result Date: 08/12/2019 CLINICAL DATA:  86 year old male status post fall with pain. EXAM: RIGHT ANKLE - COMPLETE 3+ VIEW COMPARISON:  None. FINDINGS: Oblique mildly comminuted fracture through the distal right fibula metadiaphysis. Mild posterior and lateral displacement of the fibula. Widening of the right lateral mortise joint space, as well as the tibia fibular syndesmosis. Superimposed more linear fracture through the medial malleolus, nondisplaced. The posterior malleolus appears to remain intact. Talar dome intact. Calcaneus and other visible bones of the right foot appear intact. IMPRESSION: 1. Mildly displaced and comminuted fracture of the distal right fibula metadiaphysis with suspicion of injury to the tibiofibular syndesmosis. 2. Nondisplaced fracture through the medial malleolus. Electronically Signed   By: Genevie Ann M.D.   On: 08/12/2019 03:53     Assessment/Plan Principal Problem:   Ankle fracture Active Problems:   CKD (chronic kidney disease), stage IV (HCC)   DM (diabetes mellitus), type 2 with renal complications (HCC)   History of stroke   Essential hypertension   Closed right ankle fracture    1. Right ankle fracture status post mechanical fall -orthopedic surgeon Dr. Griffin Basil has been consulted requested transfer to [facility] for surgical management and in anticipation of which patient has been placed n.p.o.  Pain relief medications. 2. History of diabetes mellitus type 2 -patient states he takes Lantus insulin 40 units twice daily.  For now I have kept patient on sliding scale coverage and after surgery restart home medication once patient started eating. 3. Hypertension we will need to check home medications.  For now as needed IV hydralazine since patient is n.p.o. 4. Chronic kidney disease stage IV as per the patient we do not have any old labs to compare.  Patient states he does follow-up with nephrologist and was told that he may  need dialysis soon.  Follow metabolic panel.  5. History of stroke -patient states he does not take any antiplatelet agents.  No residual deficits as per the patient. 6. Anemia likely could be from renal disease.  No old labs to compare follow CBC.  Covid test EKG chest x-ray all are pending.  Since patient has right ankle fracture with multiple comorbidities planning surgical management per orthopedics will need more than 2 midnight stay in inpatient status.   DVT prophylaxis: SCDs.  Pharmacological DVT prophylaxis to be started after surgery when okay with orthopedics. Code Status: Full code. Family Communication: Discussed with patient. Disposition Plan: May need rehab. Consults called: Orthopedics. Admission status: Inpatient.   Rise Patience MD [facility] Pager [phone].  If 7PM-7AM, please contact night-coverage [email or web address] Password [facility]  08/12/2019, 6:51 AM

## 2019-08-12 NOTE — ED Notes (Signed)
[facility] arrived to transport patient.  

## 2019-08-12 NOTE — Progress Notes (Signed)
Orthopedic Tech Progress Note Patient Details:  Gael Londo [facility] [birth year] [id number]  Ortho Devices Type of Ortho Device: Short leg splint, Stirrup splint Ortho Device/Splint Location: RLE Ortho Device/Splint Interventions: Application   Post Interventions Patient Tolerated: Well Instructions Provided: Care of device   Johniece Hornbaker E Caedyn Tassinari 08/12/2019, 5:42 AM

## 2019-08-12 NOTE — Plan of Care (Signed)

## 2019-08-12 NOTE — Plan of Care (Signed)
  Problem: Education: Goal: Knowledge of General Education information will improve Description: Including pain rating scale, medication(s)/side effects and non-pharmacologic comfort measures Outcome: Progressing   Problem: Pain Managment: Goal: General experience of comfort will improve Outcome: Progressing   Problem: Safety: Goal: Ability to remain free from injury will improve Outcome: Progressing   

## 2019-08-12 NOTE — Progress Notes (Signed)
Patient seen and examined on arrival to [facility].  He denies chest pain or dyspnea.  He report ankle pain, he just received pain medication.   1-HTN; resume Norvasc.   2-DM type 2; Continue with SSI. Start lantus tomorrow after surgery.  3-CKD stage IV; low rate fluids while NPO.  4-Right ankle fracture. Discussed with Dr.  Franki Monte, surgery planned for 7/17. Will allow patient to eat.  EKG sinus bradycardia. Asymptomatic. Check mg level.   Niel Hummer, MD.

## 2019-08-13 ENCOUNTER — Inpatient Hospital Stay (HOSPITAL_COMMUNITY): Payer: Medicare Other | Admitting: Certified Registered Nurse Anesthetist

## 2019-08-13 ENCOUNTER — Encounter (HOSPITAL_COMMUNITY)
Admission: EM | Disposition: A | Discharge status: Skilled Nursing Facility | Payer: Self-pay | Source: Home / Self Care | Attending: Internal Medicine

## 2019-08-13 HISTORY — PX: ORIF ANKLE FRACTURE: SHX5408

## 2019-08-13 LAB — BASIC METABOLIC PANEL
Anion gap: 7 (ref 5–15)
BUN: 32 mg/dL — ABNORMAL HIGH (ref 8–23)
CO2: 23 mmol/L (ref 22–32)
Calcium: 9.1 mg/dL (ref 8.9–10.3)
Chloride: 105 mmol/L (ref 98–111)
Creatinine, Ser: 1.94 mg/dL — ABNORMAL HIGH (ref 0.61–1.24)
GFR calc Af Amer: 34 mL/min — ABNORMAL LOW (ref >60)
GFR calc non Af Amer: 29 mL/min — ABNORMAL LOW (ref >60)
Glucose, Bld: 233 mg/dL — ABNORMAL HIGH (ref 70–99)
Potassium: 4.9 mmol/L (ref 3.5–5.1)
Sodium: 135 mmol/L (ref 135–145)

## 2019-08-13 LAB — GLUCOSE, CAPILLARY
Glucose-Capillary: 128 mg/dL — ABNORMAL HIGH (ref 70–99)
Glucose-Capillary: 190 mg/dL — ABNORMAL HIGH (ref 70–99)
Glucose-Capillary: 207 mg/dL — ABNORMAL HIGH (ref 70–99)
Glucose-Capillary: 300 mg/dL — ABNORMAL HIGH (ref 70–99)
Glucose-Capillary: 363 mg/dL — ABNORMAL HIGH (ref 70–99)
Glucose-Capillary: 365 mg/dL — ABNORMAL HIGH (ref 70–99)
Glucose-Capillary: 74 mg/dL (ref 70–99)
Glucose-Capillary: 81 mg/dL (ref 70–99)

## 2019-08-13 LAB — CBC
HCT: 34.5 % — ABNORMAL LOW (ref 39.0–52.0)
Hemoglobin: 12.1 g/dL — ABNORMAL LOW (ref 13.0–17.0)
MCH: 31.8 pg (ref 26.0–34.0)
MCHC: 35.1 g/dL (ref 30.0–36.0)
MCV: 90.6 fL (ref 80.0–100.0)
Platelets: 173 10*3/uL (ref 150–400)
RBC: 3.81 MIL/uL — ABNORMAL LOW (ref 4.22–5.81)
RDW: 14.2 % (ref 11.5–15.5)
WBC: 8.1 10*3/uL (ref 4.0–10.5)
nRBC: 0 % (ref 0.0–0.2)

## 2019-08-13 LAB — MRSA PCR SCREENING: MRSA by PCR: NEGATIVE

## 2019-08-13 SURGERY — OPEN REDUCTION INTERNAL FIXATION (ORIF) ANKLE FRACTURE
Anesthesia: Regional | Site: Ankle | Laterality: Right

## 2019-08-13 MED ORDER — JUVEN PO PACK
1.0000 | PACK | Freq: Two times a day (BID) | ORAL | Status: DC
  Administered 2019-08-13 – 2019-08-17 (×8): 1 via ORAL
  Filled 2019-08-13 (×8): qty 1

## 2019-08-13 MED ORDER — FENTANYL CITRATE (PF) 100 MCG/2ML IJ SOLN
INTRAMUSCULAR | Status: DC | PRN
  Administered 2019-08-13: 50 ug via INTRAVENOUS

## 2019-08-13 MED ORDER — LIDOCAINE 2% (20 MG/ML) 5 ML SYRINGE
INTRAMUSCULAR | Status: AC
  Filled 2019-08-13: qty 5

## 2019-08-13 MED ORDER — ONDANSETRON HCL 4 MG PO TABS: 4.0000 mg | ORAL_TABLET | Freq: Four times a day (QID) | ORAL | Status: DC | PRN

## 2019-08-13 MED ORDER — ONDANSETRON HCL 4 MG/2ML IJ SOLN: 4.0000 mg | Freq: Four times a day (QID) | INTRAMUSCULAR | Status: DC | PRN

## 2019-08-13 MED ORDER — EPHEDRINE SULFATE-NACL 50-0.9 MG/10ML-% IV SOSY
PREFILLED_SYRINGE | INTRAVENOUS | Status: DC | PRN
  Administered 2019-08-13 (×2): 10 mg via INTRAVENOUS
  Administered 2019-08-13: 7.5 mg via INTRAVENOUS
  Administered 2019-08-13 (×2): 10 mg via INTRAVENOUS

## 2019-08-13 MED ORDER — CLONIDINE HCL (ANALGESIA) 100 MCG/ML EP SOLN: EPIDURAL | Status: DC | PRN

## 2019-08-13 MED ORDER — CEFAZOLIN SODIUM-DEXTROSE 2-4 GM/100ML-% IV SOLN
2.0000 g | Freq: Three times a day (TID) | INTRAVENOUS | Status: AC
  Administered 2019-08-13 (×2): 2 g via INTRAVENOUS
  Filled 2019-08-13 (×2): qty 100

## 2019-08-13 MED ORDER — CHLORHEXIDINE GLUCONATE 0.12 % MT SOLN
15.0000 mL | Freq: Once | OROMUCOSAL | Status: AC
  Administered 2019-08-13: 15 mL via OROMUCOSAL

## 2019-08-13 MED ORDER — ONDANSETRON HCL 4 MG/2ML IJ SOLN
INTRAMUSCULAR | Status: AC
  Filled 2019-08-13: qty 2

## 2019-08-13 MED ORDER — PROPOFOL 10 MG/ML IV BOLUS
INTRAVENOUS | Status: AC
  Filled 2019-08-13: qty 20

## 2019-08-13 MED ORDER — CLONIDINE HCL (ANALGESIA) 100 MCG/ML EP SOLN
EPIDURAL | Status: DC | PRN
  Administered 2019-08-13: 70 ug
  Administered 2019-08-13: 30 ug

## 2019-08-13 MED ORDER — CEFAZOLIN SODIUM-DEXTROSE 2-4 GM/100ML-% IV SOLN
INTRAVENOUS | Status: AC
  Filled 2019-08-13: qty 100

## 2019-08-13 MED ORDER — ACETAMINOPHEN 325 MG PO TABS
325.0000 mg | ORAL_TABLET | Freq: Four times a day (QID) | ORAL | Status: DC | PRN
  Administered 2019-08-14: 500 mg via ORAL
  Administered 2019-08-15 – 2019-08-18 (×3): 650 mg via ORAL
  Filled 2019-08-13 (×4): qty 2

## 2019-08-13 MED ORDER — PROPOFOL 500 MG/50ML IV EMUL
INTRAVENOUS | Status: DC | PRN
  Administered 2019-08-13: 30 ug/kg/min via INTRAVENOUS

## 2019-08-13 MED ORDER — BUPIVACAINE HCL (PF) 0.5 % IJ SOLN
INTRAMUSCULAR | Status: DC | PRN
Start: 2019-08-13 — End: 2019-08-13

## 2019-08-13 MED ORDER — METOCLOPRAMIDE HCL 5 MG PO TABS: 5.0000 mg | ORAL_TABLET | Freq: Three times a day (TID) | ORAL | Status: DC | PRN

## 2019-08-13 MED ORDER — DEXAMETHASONE SODIUM PHOSPHATE 4 MG/ML IJ SOLN
INTRAMUSCULAR | Status: DC | PRN
  Administered 2019-08-13: 6 mg via PERINEURAL
  Administered 2019-08-13: 3 mg via PERINEURAL

## 2019-08-13 MED ORDER — DEXAMETHASONE SODIUM PHOSPHATE 10 MG/ML IJ SOLN
INTRAMUSCULAR | Status: AC
  Filled 2019-08-13: qty 1

## 2019-08-13 MED ORDER — CEFAZOLIN SODIUM-DEXTROSE 2-4 GM/100ML-% IV SOLN
2.0000 g | Freq: Once | INTRAVENOUS | Status: AC
  Administered 2019-08-13: 2 g via INTRAVENOUS

## 2019-08-13 MED ORDER — BUPIVACAINE HCL (PF) 0.5 % IJ SOLN
INTRAMUSCULAR | Status: DC | PRN
Start: 2019-08-13 — End: 2019-08-13
  Administered 2019-08-13: 25 mL via PERINEURAL
  Administered 2019-08-13: 15 mL via PERINEURAL

## 2019-08-13 MED ORDER — 0.9 % SODIUM CHLORIDE (POUR BTL) OPTIME
TOPICAL | Status: DC | PRN
  Administered 2019-08-13: 1000 mL

## 2019-08-13 MED ORDER — BUPIVACAINE HCL (PF) 0.25 % IJ SOLN
INTRAMUSCULAR | Status: AC
  Filled 2019-08-13: qty 30

## 2019-08-13 MED ORDER — ENSURE ENLIVE PO LIQD
237.0000 mL | Freq: Two times a day (BID) | ORAL | Status: DC
  Administered 2019-08-13 – 2019-08-14 (×2): 237 mL via ORAL

## 2019-08-13 MED ORDER — FENTANYL CITRATE (PF) 250 MCG/5ML IJ SOLN
INTRAMUSCULAR | Status: AC
  Filled 2019-08-13: qty 5

## 2019-08-13 MED ORDER — POLYETHYLENE GLYCOL 3350 17 G PO PACK
17.0000 g | PACK | Freq: Two times a day (BID) | ORAL | Status: DC
  Administered 2019-08-13 – 2019-08-22 (×15): 17 g via ORAL
  Filled 2019-08-13 (×17): qty 1

## 2019-08-13 MED ORDER — DEXAMETHASONE SODIUM PHOSPHATE 4 MG/ML IJ SOLN: INTRAMUSCULAR | Status: DC | PRN

## 2019-08-13 MED ORDER — ACETAMINOPHEN 500 MG PO TABS
500.0000 mg | ORAL_TABLET | Freq: Four times a day (QID) | ORAL | Status: AC
  Administered 2019-08-13 – 2019-08-14 (×4): 500 mg via ORAL
  Filled 2019-08-13 (×4): qty 1

## 2019-08-13 MED ORDER — METOCLOPRAMIDE HCL 5 MG/ML IJ SOLN: 5.0000 mg | Freq: Three times a day (TID) | INTRAMUSCULAR | Status: DC | PRN

## 2019-08-13 MED ORDER — DOCUSATE SODIUM 100 MG PO CAPS
100.0000 mg | ORAL_CAPSULE | Freq: Two times a day (BID) | ORAL | Status: DC
  Administered 2019-08-13 – 2019-08-22 (×16): 100 mg via ORAL
  Filled 2019-08-13 (×18): qty 1

## 2019-08-13 SURGICAL SUPPLY — 75 items
BANDAGE ESMARK 6X9 LF (GAUZE/BANDAGES/DRESSINGS) ×1 IMPLANT
BIT DRILL 3.5X122MM AO FIT (BIT) ×2 IMPLANT
BIT DRILL CANN 2.7 (BIT) ×1
BIT DRILL SRG 2.7XCANN AO CPLG (BIT) ×1 IMPLANT
BIT DRL SRG 2.7XCANN AO CPLNG (BIT) ×1
BNDG ELASTIC 4X5.8 VLCR STR LF (GAUZE/BANDAGES/DRESSINGS) ×2 IMPLANT
BNDG ESMARK 6X9 LF (GAUZE/BANDAGES/DRESSINGS) ×2
CANISTER SUCT 3000ML PPV (MISCELLANEOUS) ×2 IMPLANT
COVER SURGICAL LIGHT HANDLE (MISCELLANEOUS) ×2 IMPLANT
COVER WAND RF STERILE (DRAPES) ×2 IMPLANT
CUFF TOURN SGL QUICK 34 (TOURNIQUET CUFF)
CUFF TRNQT CYL 34X4.125X (TOURNIQUET CUFF) IMPLANT
DRAPE C-ARM 42X72 X-RAY (DRAPES) IMPLANT
DRAPE C-ARMOR (DRAPES) IMPLANT
DRAPE OEC MINIVIEW 54X84 (DRAPES) ×2 IMPLANT
DRAPE ORTHO SPLIT 77X108 STRL (DRAPES)
DRAPE SURG ORHT 6 SPLT 77X108 (DRAPES) IMPLANT
DRAPE U-SHAPE 47X51 STRL (DRAPES) IMPLANT
DRILL 2.6X122MM WL AO SHAFT (BIT) ×2 IMPLANT
DRSG ADAPTIC 3X8 NADH LF (GAUZE/BANDAGES/DRESSINGS) ×2 IMPLANT
DRSG PAD ABDOMINAL 8X10 ST (GAUZE/BANDAGES/DRESSINGS) ×4 IMPLANT
DURAPREP 26ML APPLICATOR (WOUND CARE) ×2 IMPLANT
ELECT REM PT RETURN 9FT ADLT (ELECTROSURGICAL) ×2
ELECTRODE REM PT RTRN 9FT ADLT (ELECTROSURGICAL) ×1 IMPLANT
GAUZE SPONGE 4X4 12PLY STRL (GAUZE/BANDAGES/DRESSINGS) ×2 IMPLANT
GAUZE SPONGE 4X4 12PLY STRL LF (GAUZE/BANDAGES/DRESSINGS) ×2 IMPLANT
GLOVE BIO SURGEON STRL SZ7.5 (GLOVE) ×4 IMPLANT
GLOVE BIO SURGEON STRL SZ8 (GLOVE) ×2 IMPLANT
GLOVE BIOGEL PI IND STRL 8 (GLOVE) ×2 IMPLANT
GLOVE BIOGEL PI INDICATOR 8 (GLOVE) ×2
GOWN STRL REUS W/ TWL LRG LVL3 (GOWN DISPOSABLE) ×2 IMPLANT
GOWN STRL REUS W/ TWL XL LVL3 (GOWN DISPOSABLE) ×1 IMPLANT
GOWN STRL REUS W/TWL LRG LVL3 (GOWN DISPOSABLE) ×2
GOWN STRL REUS W/TWL XL LVL3 (GOWN DISPOSABLE) ×1
IMPL TIGHTROP W/DRV K-LESS (Anchor) ×1 IMPLANT
IMPLANT TIGHTROPE W/DRV K-LESS (Anchor) ×2 IMPLANT
K-WIRE ORTHOPEDIC 1.4X150L (WIRE) ×4
KIT BASIN OR (CUSTOM PROCEDURE TRAY) ×2 IMPLANT
KIT TURNOVER KIT B (KITS) ×2 IMPLANT
KWIRE ORTHOPEDIC 1.4X150L (WIRE) ×2 IMPLANT
MANIFOLD NEPTUNE II (INSTRUMENTS) ×2 IMPLANT
NEEDLE 22X1 1/2 (OR ONLY) (NEEDLE) ×2 IMPLANT
NS IRRIG 1000ML POUR BTL (IV SOLUTION) ×2 IMPLANT
PACK ORTHO EXTREMITY (CUSTOM PROCEDURE TRAY) ×2 IMPLANT
PAD ABD 8X10 STRL (GAUZE/BANDAGES/DRESSINGS) ×2 IMPLANT
PAD ARMBOARD 7.5X6 YLW CONV (MISCELLANEOUS) ×4 IMPLANT
PAD CAST 4YDX4 CTTN HI CHSV (CAST SUPPLIES) ×2 IMPLANT
PADDING CAST ABS 4INX4YD NS (CAST SUPPLIES) ×1
PADDING CAST ABS COTTON 4X4 ST (CAST SUPPLIES) ×1 IMPLANT
PADDING CAST COTTON 4X4 STRL (CAST SUPPLIES) ×2
PLATE 1/3 TUBULAR 7H (Plate) ×2 IMPLANT
SCREW CANC 2.5XFT HEX12X4X (Screw) ×1 IMPLANT
SCREW CANC FT 16X4X2.5XHEX (Screw) ×1 IMPLANT
SCREW CANCELLOUS 4.0X12MM (Screw) ×1 IMPLANT
SCREW CANCELLOUS 4.0X14 (Screw) ×2 IMPLANT
SCREW CANCELLOUS 4.0X16MM (Screw) ×1 IMPLANT
SCREW CANN ASNIS III 4.0X40MM (Screw) ×2 IMPLANT
SCREW CORTEX ST MATTA 3.5X16MM (Screw) ×6 IMPLANT
SCREW CORTEX ST MATTA 3.5X20 (Screw) ×4 IMPLANT
SPONGE LAP 18X18 RF (DISPOSABLE) ×2 IMPLANT
STRIP CLOSURE SKIN 1/2X4 (GAUZE/BANDAGES/DRESSINGS) ×2 IMPLANT
SUCTION FRAZIER HANDLE 10FR (MISCELLANEOUS) ×1
SUCTION TUBE FRAZIER 10FR DISP (MISCELLANEOUS) ×1 IMPLANT
SUT MNCRL AB 4-0 PS2 18 (SUTURE) ×2 IMPLANT
SUT MON AB 2-0 CT1 27 (SUTURE) IMPLANT
SUT VIC AB 0 CT1 27 (SUTURE)
SUT VIC AB 0 CT1 27XBRD ANBCTR (SUTURE) IMPLANT
SYR BULB IRRIG 60ML STRL (SYRINGE) ×2 IMPLANT
SYR CONTROL 10ML LL (SYRINGE) IMPLANT
TOWEL GREEN STERILE (TOWEL DISPOSABLE) ×2 IMPLANT
TOWEL GREEN STERILE FF (TOWEL DISPOSABLE) ×2 IMPLANT
TUBE CONNECTING 12X1/4 (SUCTIONS) ×2 IMPLANT
UNDERPAD 30X36 HEAVY ABSORB (UNDERPADS AND DIAPERS) ×4 IMPLANT
WATER STERILE IRR 1000ML POUR (IV SOLUTION) ×2 IMPLANT
YANKAUER SUCT BULB TIP NO VENT (SUCTIONS) IMPLANT

## 2019-08-13 NOTE — Anesthesia Preprocedure Evaluation (Addendum)
Anesthesia Evaluation  Patient identified by MRN, date of birth, ID band Patient awake    Reviewed: Allergy & Precautions, NPO status , Patient's Chart, lab work & pertinent test results  Airway Mallampati: II  TM Distance: >3 FB Neck ROM: Full    Dental  (+) Dental Advisory Given   Pulmonary neg pulmonary ROS, former smoker,    Pulmonary exam normal breath sounds clear to auscultation       Cardiovascular hypertension, Pt. on medications Normal cardiovascular exam Rhythm:Regular Rate:Normal     Neuro/Psych CVA    GI/Hepatic negative GI ROS, Neg liver ROS,   Endo/Other  negative endocrine ROSdiabetes  Renal/GU Renal disease     Musculoskeletal  (+) Arthritis ,   Abdominal (+) + obese,   Peds  Hematology negative hematology ROS (+)   Anesthesia Other Findings   Reproductive/Obstetrics                           Anesthesia Physical Anesthesia Plan  ASA: III  Anesthesia Plan: Regional   Post-op Pain Management:    Induction: Intravenous  PONV Risk Score and Plan: 1 and Propofol infusion, TIVA, Treatment may vary due to age or medical condition and Ondansetron  Airway Management Planned: Natural Airway  Additional Equipment:   Intra-op Plan:   Post-operative Plan:   Informed Consent: I have reviewed the patients History and Physical, chart, labs and discussed the procedure including the risks, benefits and alternatives for the proposed anesthesia with the patient or authorized representative who has indicated his/her understanding and acceptance.     Dental advisory given  Plan Discussed with: CRNA  Anesthesia Plan Comments:         Anesthesia Quick Evaluation

## 2019-08-13 NOTE — Transfer of Care (Signed)
Immediate Anesthesia Transfer of Care Note  Patient: Kutler Vanvranken Tomb  Procedure(s) Performed: OPEN REDUCTION INTERNAL FIXATION (ORIF) ANKLE FRACTURE (Right Ankle)  Patient Location: PACU  Anesthesia Type:Regional  Level of Consciousness: drowsy  Airway & Oxygen Therapy: Patient Spontanous Breathing  Post-op Assessment: Report given to RN and Post -op Vital signs reviewed and stable  Post vital signs: Reviewed and stable  Last Vitals:  Vitals Value Taken Time  BP 128/61 08/13/19 0932  Temp    Pulse 61 08/13/19 0933  Resp 12 08/13/19 0933  SpO2 97 % 08/13/19 0933  Vitals shown include unvalidated device data.  Last Pain:  Vitals:   08/13/19 0419  TempSrc: Oral  PainSc:       Patients Stated Pain Goal: 2 (90/90/30 1499)  Complications: No complications documented.

## 2019-08-13 NOTE — Progress Notes (Signed)
PHARMACY NOTE:  ANTIMICROBIAL RENAL DOSAGE ADJUSTMENT  Current antimicrobial regimen includes a mismatch between antimicrobial dosage and estimated renal function.  As per policy approved by the Pharmacy & Therapeutics and Medical Executive Committees, the antimicrobial dosage will be adjusted accordingly.  Current antimicrobial dosage:  Cefazolin 2 gm IV Q 6 hrs X 3 doses  Indication: Surgical prophylaxis  Renal Function:  Estimated Creatinine Clearance: 28.9 mL/min (A) (by C-G formula based on SCr of 1.94 mg/dL (H)). []      On intermittent HD, scheduled: []      On CRRT    Antimicrobial dosage has been changed to:  Cefazolin 2 gm IV Q 8 hrs X 2 doses  Thank you for allowing pharmacy to be a part of this patient's care.  Gillermina Hu, PharmD, BCPS, [facility] Clinical Pharmacist 08/13/2019 4:46 PM

## 2019-08-13 NOTE — Op Note (Signed)
08/13/2019  9:24 AM  PATIENT:  [name]    PRE-OPERATIVE DIAGNOSIS:  right ankle fracture  POST-OPERATIVE DIAGNOSIS:  Same  PROCEDURE:  OPEN REDUCTION INTERNAL FIXATION (ORIF) ANKLE FRACTURE  SURGEON:  Renette Butters, MD  ASSISTANT: Roxan Hockey, PA-C, he was present and scrubbed throughout the case, critical for completion in a timely fashion, and for retraction, instrumentation, and closure.   ANESTHESIA:   gen   PREOPERATIVE INDICATIONS:  Martin Richardson is a  86 y.o. male with a diagnosis of right ankle fracture who failed conservative measures and elected for surgical management.    The risks benefits and alternatives were discussed with the patient preoperatively including but not limited to the risks of infection, bleeding, nerve injury, cardiopulmonary complications, the need for revision surgery, among others, and the patient was willing to proceed.  OPERATIVE IMPLANTS: stryker trauma and arthrex tight rop  OPERATIVE FINDINGS: Unstable ankle fracture. Stable syndesmosis post op  BLOOD LOSS: min  COMPLICATIONS: none  TOURNIQUET TIME: 45  OPERATIVE PROCEDURE:  Patient was identified in the preoperative holding area and site was marked by me He was transported to the operating theater and placed on the table in supine position taking care to pad all bony prominences. After a preincinduction time out anesthesia was induced. The right lower extremity was prepped and draped in normal sterile fashion and a pre-incision timeout was performed. Molli Hazard Hobbs received ancef for preoperative antibiotics.   I made a lateral incision of roughly 7 cm dissection was carried down sharply to the distal fibula and then spreading dissection was used proximally to protect the superficial peroneal nerve. I sharply incised the periosteum and took care to protect the peroneal tendons. I then debrided the fracture site and performed a reduction maneuver which was held in place  with a clamp.   I placed a lag screw across the fracture  I then selected a 7-hole one third tubular plate and placed in a neutralization fashion care was taken distally so as not to penetrate the joint with the cancellus screws.  I then turned my attention medially where I created a 4 cm incision and dissected sharply down to the medial Mal fracture taking care to protect the saphenous vein. I debrided the fracture and reduced and held in place with a tenaculum. I then drilled and placed a partially threaded 45 mm cannulated screws one anterior and one posterior across the fracture.  I then stressed the syndesmosis and placed a tight rope to internally stabilize this  The wound was then thoroughly irrigated and closed using a 0 Vicryl and absorbable Monocryl sutures. He was placed in a short leg splint.   POST OPERATIVE PLAN: Non-weightbearing. DVT prophylaxis will consist of mobilization and chemical px

## 2019-08-13 NOTE — Consult Note (Signed)
ORTHOPAEDIC CONSULTATION  REQUESTING PHYSICIAN: Elmarie Shiley, MD  Chief Complaint: right ankle fracture  HPI: Story Vanvranken is a 86 y.o. male who complains of  A mechanical fall and R ankle fracture. I have reviewed and agree with below history  Keita [place] is a 86 y.o. male with history of diabetes mellitus type 2, chronic kidney disease stage IV, anemia, hypertension history of stroke had a fall at home when he was walking to the bathroom.  Patient states he tripped on a rug and fell onto the floor.  Did not hit his head or lose consciousness.  Denies any chest pain or shortness of breath.  Patient just recently moved from [place].  Past Medical History:  Diagnosis Date  . Arthritis   . Blind left eye   . Cancer of left kidney ([place])   . Cataract   . Chronic kidney disease   . Constipation   . Diabetes mellitus without complication ([place])   . Glaucoma   . Hypertension   . Neuropathy   . Stroke [facility])    Past Surgical History:  Procedure Laterality Date  . CHOLECYSTECTOMY    . THYROID SURGERY    . TONSILLECTOMY     Social History   Socioeconomic History  . Marital status: Widowed    Spouse name: Not on file  . Number of children: Not on file  . Years of education: Not on file  . Highest education level: Not on file  Occupational History  . Not on file  Tobacco Use  . Smoking status: Former Research scientist (life sciences)  . Smokeless tobacco: Former Network engineer and Sexual Activity  . Alcohol use: Never  . Drug use: Never  . Sexual activity: Not on file  Other Topics Concern  . Not on file  Social History Narrative  . Not on file   Social Determinants of Health   Financial Resource Strain:   . Difficulty of Paying Living Expenses:   Food Insecurity:   . Worried About Charity fundraiser in the Last Year:   . Arboriculturist in the Last Year:   Transportation Needs:   . Film/video editor (Medical):   Marland Kitchen Lack of Transportation (Non-Medical):     Physical Activity:   . Days of Exercise per Week:   . Minutes of Exercise per Session:   Stress:   . Feeling of Stress :   Social Connections:   . Frequency of Communication with Friends and Family:   . Frequency of Social Gatherings with Friends and Family:   . Attends Religious Services:   . Active Member of Clubs or Organizations:   . Attends Archivist Meetings:   Marland Kitchen Marital Status:    Family History  Problem Relation Age of Onset  . Diabetes Mellitus II Mother    Allergies  Allergen Reactions  . Adhesive [Tape] Rash   Prior to Admission medications   Medication Sig Start Date End Date Taking? Authorizing Provider  acetaminophen (TYLENOL) 325 MG tablet Take 650 mg by mouth every 4 (four) hours as needed for mild pain or headache.   Yes [provider]  amLODipine (NORVASC) 5 MG tablet Take 5 mg by mouth daily.   Yes [provider]  aspirin 325 MG EC tablet Take 325 mg by mouth daily.   Yes [provider]  atorvastatin (LIPITOR) 80 MG tablet Take 80 mg by mouth daily.   Yes [provider]  betamethasone valerate (VALISONE) 0.1 % cream Apply 1 application topically 3 (three) times a week. psoriasis   Yes [provider]  docusate sodium (COLACE) 100 MG capsule Take 100 mg by mouth 2 (two) times daily.   Yes [provider]  Ergocalciferol (VITAMIN D2) 50 MCG (2000 UT) TABS Take 2,000 Units by mouth daily.   Yes [provider]  famotidine (PEPCID) 20 MG tablet Take 20 mg by mouth at bedtime.   Yes [provider]  glimepiride (AMARYL) 4 MG tablet Take 4 mg by mouth 2 (two) times daily.    Yes [provider]  insulin glargine (LANTUS) 100 UNIT/ML injection Inject 40 Units into the skin 2 (two) times daily.   Yes [provider]  levothyroxine (SYNTHROID) 125 MCG tablet Take 125 mcg by mouth daily before breakfast.   Yes [provider]  polyethylene glycol (MIRALAX /  GLYCOLAX) 17 g packet Take 17 g by mouth 2 (two) times daily.   Yes [provider]  timolol (BETIMOL) 0.5 % ophthalmic solution Place 1 drop into both eyes 2 (two) times daily.   Yes [provider]   DG Ankle Complete Right  Result Date: 08/12/2019 CLINICAL DATA:  86 year old male status post fall with pain. EXAM: RIGHT ANKLE - COMPLETE 3+ VIEW COMPARISON:  None. FINDINGS: Oblique mildly comminuted fracture through the distal right fibula metadiaphysis. Mild posterior and lateral displacement of the fibula. Widening of the right lateral mortise joint space, as well as the tibia fibular syndesmosis. Superimposed more linear fracture through the medial malleolus, nondisplaced. The posterior malleolus appears to remain intact. Talar dome intact. Calcaneus and other visible bones of the right foot appear intact. IMPRESSION: 1. Mildly displaced and comminuted fracture of the distal right fibula metadiaphysis with suspicion of injury to the tibiofibular syndesmosis. 2. Nondisplaced fracture through the medial malleolus. Electronically Signed   By: Genevie Ann M.D.   On: 08/12/2019 03:53   DG CHEST PORT 1 VIEW  Result Date: 08/12/2019 CLINICAL DATA:  Preop ankle surgery EXAM: PORTABLE CHEST 1 VIEW COMPARISON:  None. FINDINGS: Mild left basilar scarring/atelectasis. Lungs are otherwise clear. No pleural effusion or pneumothorax. Heart is normal in size. Surgical clips along the left neck. IMPRESSION: No evidence of acute cardiopulmonary disease. Electronically Signed   By: Julian Hy M.D.   On: 08/12/2019 07:37    Positive ROS: All other systems have been reviewed and were otherwise negative with the exception of those mentioned in the HPI and as above.  Labs cbc Recent Labs    08/12/19 0540  WBC 11.2*  HGB 12.5*  HCT 35.6*  PLT 189    Labs inflam No results for input(s): CRP in the last 72 hours.  Invalid input(s): ESR  Labs coag No results for input(s): INR, PTT in the  last 72 hours.  Invalid input(s): PT  Recent Labs    08/12/19 0540  NA 137  K 4.7  CL 107  CO2 22  GLUCOSE 200*  BUN 43*  CREATININE 1.95*  CALCIUM 9.3    Physical Exam: Vitals:   08/13/19 0012 08/13/19 0419  BP: 137/70 140/64  Pulse: 60 (!) 55  Resp: 14 15  Temp: 97.8 F (36.6 C) 98 F (36.7 C)  SpO2: 94% 98%   General: Alert, no acute distress Cardiovascular: No pedal edema Respiratory: No cyanosis, no use of accessory musculature GI: No organomegaly, abdomen is soft and non-tender Skin: No lesions in the area of chief complaint other than  those listed below in MSK exam.  Neurologic: Sensation intact distally save for the below mentioned MSK exam Psychiatric: Patient is competent for consent with normal mood and affect Lymphatic: No axillary or cervical lymphadenopathy  MUSCULOSKELETAL:  RLE: skin benign, NVI, Compartments soft Other extremities are atraumatic with painless ROM and NVI.  Assessment: Bimal ankle fracture and syndesmosis [id number]  Plan: I discussed the risks and benefits of surgery including anesthetic risk and possible skin breakdown. Patient and family wish to proceed.  Plan for ORIF bimal ankle and syndesmosis   Renette Butters, MD    08/13/2019 7:46 AM

## 2019-08-13 NOTE — Progress Notes (Signed)
Patient has been NPO since 0000, CHG bath completed this morning, will continue to monitor

## 2019-08-13 NOTE — Progress Notes (Signed)
Initial Nutrition Assessment  DOCUMENTATION CODES:   Obesity unspecified  INTERVENTION:  -Ensure Enlive po BID, each supplement provides 350 kcal and 20 grams of protein -1 packet Juven BID, each packet provides 95 calories, 2.5 grams of protein (collagen), and 9.8 grams of carbohydrate (3 grams sugar); also contains 7 grams of L-arginine and L-glutamine, 300 mg vitamin C, 15 mg vitamin E, 1.2 mcg vitamin B-12, 9.5 mg zinc, 200 mg calcium, and 1.5 g  Calcium Beta-hydroxy-Beta-methylbutyrate to support wound healing  NUTRITION DIAGNOSIS:   Increased nutrient needs related to post-op healing as evidenced by estimated needs.   GOAL:   Patient will meet greater than or equal to 90% of their needs   MONITOR:   Labs, I & O's, Supplement acceptance, PO intake, Weight trends  REASON FOR ASSESSMENT:   Consult Assessment of nutrition requirement/status  ASSESSMENT:  RD working remotely.  86 year old male admitted for surgical management of right ankle fracture after mechanical fall at home with past medical history of DM2, CKD stage IV, anemia, HTN, and history of stroke.  Patient failed conservative measures, elected for surgical management and underwent ORIF of right ankle this morning.  Unable to reach pt via phone to obtain nutrition history. Diet advanced to CM at 1025, no documented intakes at this time.  Will order Ensure BID as well as Juven to aid with meeting increased needs to support post-op wound healing.  Current wt 221.54 lb Per chart, patient recently moved here from [place], no weight history available for review.   Medications reviewed and include: SSI, Colace, Miralax, Senokot IVF: NaCl @ 40 ml/hr  Labs: CBGs [phone] BUN 43 (H), Cr 1.95 (H) No results found for: HGBA1C  NUTRITION - FOCUSED PHYSICAL EXAM: Unable to complete at this time, RD working remotely.  Diet Order:   Diet Order            Diet Carb Modified Fluid consistency: Thin; Room  service appropriate? Yes  Diet effective now                 EDUCATION NEEDS:   No education needs have been identified at this time  Skin:  Skin Assessment: Skin Integrity Issues: Skin Integrity Issues:: Incisions Incisions: Closed; R leg  Last BM:  7/16  Height:   Ht Readings from Last 1 Encounters:  08/12/19 5\' 10"  (1.778 m)    Weight:   Wt Readings from Last 1 Encounters:  08/12/19 100.7 kg    Ideal Body Weight:  75.5 kg  BMI:  Body mass index is 31.85 kg/m.  Estimated Nutritional Needs:   Kcal:  2000-2200  Protein:  90-100  Fluid:  >/= 2 L   Lajuan Lines, RD, LDN Clinical Nutrition After Hours/Weekend Pager # in [place]

## 2019-08-13 NOTE — Anesthesia Postprocedure Evaluation (Signed)
Anesthesia Post Note  Patient: Martin Richardson  Procedure(s) Performed: OPEN REDUCTION INTERNAL FIXATION (ORIF) ANKLE FRACTURE (Right Ankle)     Patient location during evaluation: PACU Anesthesia Type: Regional Level of consciousness: awake and alert Pain management: pain level controlled Vital Signs Assessment: post-procedure vital signs reviewed and stable Respiratory status: spontaneous breathing Cardiovascular status: stable Anesthetic complications: no   No complications documented.  Last Vitals:  Vitals:   08/13/19 1403 08/13/19 2016  BP: 130/70 110/72  Pulse: 62 (!) 109  Resp: 16 18  Temp: (!) 36.4 C (!) 36.3 C  SpO2: 98% 100%    Last Pain:  Vitals:   08/13/19 2016  TempSrc: Oral  PainSc:                  Nolon Nations

## 2019-08-13 NOTE — Anesthesia Procedure Notes (Signed)
Anesthesia Regional Block: Adductor canal block   Pre-Anesthetic Checklist: ,, timeout performed, Correct Patient, Correct Site, Correct Laterality, Correct Procedure, Correct Position, site marked, Risks and benefits discussed, Surgical consent,  Pre-op evaluation,  Post-op pain management  Laterality: Right  Prep: chloraprep       Needles:  Injection technique: Single-shot  Needle Type: Stimiplex     Needle Length: 9cm  Needle Gauge: 21     Additional Needles:   Procedures:,,,, ultrasound used (permanent image in chart),,,,  Narrative:  Start time: 08/13/2019 7:41 AM End time: 08/13/2019 7:46 AM Injection made incrementally with aspirations every 5 mL.  Performed by: Personally  Anesthesiologist: Nolon Nations, MD  Additional Notes: BP cuff, EKG monitors applied. Sedation begun. Artery and nerve location verified with U/S and anesthetic injected incrementally, slowly, and after negative aspirations under direct u/s guidance. Good fascial /perineural spread. Tolerated well.

## 2019-08-13 NOTE — Anesthesia Procedure Notes (Signed)
Anesthesia Regional Block: Popliteal block   Pre-Anesthetic Checklist: ,, timeout performed, Correct Patient, Correct Site, Correct Laterality, Correct Procedure, Correct Position, site marked, Risks and benefits discussed,  Surgical consent,  Pre-op evaluation,  At surgeon's request and post-op pain management  Laterality: Right  Prep: chloraprep       Needles:  Injection technique: Single-shot  Needle Type: Stimiplex     Needle Length: 10cm  Needle Gauge: 21     Additional Needles:   Procedures:,,,, ultrasound used (permanent image in chart),,,,  Motor weakness within 5 minutes.   Nerve Stimulator or Paresthesia:  Response: 0.5 mA,   Additional Responses:   Narrative:  Start time: 08/13/2019 7:47 AM End time: 08/13/2019 7:52 AM Injection made incrementally with aspirations every 5 mL.  Performed by: Personally  Anesthesiologist: Nolon Nations, MD  Additional Notes: Nerve located and needle positioned with direct ultrasound guidance. Good perineural spread. Patient tolerated well.

## 2019-08-13 NOTE — Progress Notes (Signed)
PROGRESS NOTE    Rudolf Blizard  NWG:[id number] DOB: 12/07/1925 DOA: 08/12/2019 PCP: [name]   Brief Narrative: 86 year old with past medical history significant for diabetes type 2, CKD stage IV, anemia, hypertension, history of stroke who had a fall at home when he was walking to the bathroom.  Patient presented after mechanical fall he was found to have ankle fracture.  Patient underwent open reduction and internal fixation of right ankle by Dr. Percell Miller on 7/17.   Assessment & Plan:   Principal Problem:   Ankle fracture Active Problems:   CKD (chronic kidney disease), stage IV ([facility])   DM (diabetes mellitus), type 2 with renal complications ([facility])   History of stroke   Essential hypertension   Closed right ankle fracture  1-Right ankle fracture: Status post open reduction and internal fixation of right ankle fracture. PT per Ortho Bowel regimen ordered. Pain medications available. Incentive spirometry Awaiting recommendation from orthopedic for DVT prophylaxis.  2-Hypertension: Continue with Norvasc 3-Diabetes type 2 with hypoglycemia. CBG below 200.  Continue with a sliding scale insulin.  Plan to start Lantus if CBG started to increase 4-CKD stage IV: Creatinine stable at 1.9 5-Constipation: Resume home dose MiraLAX, continue with Senokot 6-anemia: Could be from renal disease.  Monitor hemoglobin.   Estimated body mass index is 31.85 kg/m as calculated from the following:   Height as of this encounter: 5\' 10"  (1.778 m).   Weight as of this encounter: 100.7 kg.   DVT prophylaxis: DVT per Ortho Code Status: Full code Family Communication: Care discussed with daughters who were at bedside Disposition Plan:  Status is: Inpatient  Remains inpatient appropriate because:Ongoing active pain requiring inpatient pain management   Dispo: The patient is from: Home              Anticipated d/c is to: To be determined              Anticipated  d/c date is: 2 days              Patient currently is not medically stable to d/c.        Consultants:   Percell Miller  Procedures:   open reduction and internal fixation of right ankle by Dr. Percell Miller on 7/17.    Antimicrobials:  Cefazolin prophylaxis as per surgery  Subjective: He is alert eating lunch, pain is controlled.  Daughter at bedside report patient has a history of constipation.  He takes MiraLAX daily twice a day  Objective: Vitals:   08/12/19 1604 08/12/19 1947 08/13/19 0012 08/13/19 0419  BP: 140/62 133/67 137/70 140/64  Pulse: (!) 53 62 60 (!) 55  Resp: 17 16 14 15   Temp: 98.5 F (36.9 C) (!) 97.5 F (36.4 C) 97.8 F (36.6 C) 98 F (36.7 C)  TempSrc:  Oral Oral Oral  SpO2: 100% 99% 94% 98%  Weight:      Height:        Intake/Output Summary (Last 24 hours) at 08/13/2019 0846 Last data filed at 08/13/2019 0807 Gross per 24 hour  Intake 100 ml  Output 400 ml  Net -300 ml   Filed Weights   08/12/19 0328  Weight: 100.7 kg    Examination:  General exam: Appears calm and comfortable  Respiratory system: Clear to auscultation. Respiratory effort normal. Cardiovascular system: S1 & S2 heard, RRR. No JVD, murmurs, rubs, gallops or clicks. No pedal edema. Gastrointestinal system: Abdomen is nondistended, soft and nontender. No organomegaly or masses felt.  Normal bowel sounds heard. Central nervous system: Alert and oriented. No focal neurological deficits. Extremities: Symmetric 5 x 5 power.  Right lower extremity with dressing  Data Reviewed: I have personally reviewed following labs and imaging studies  CBC: Recent Labs  Lab 08/12/19 0540  WBC 11.2*  HGB 12.5*  HCT 35.6*  MCV 91.0  PLT 268   Basic Metabolic Panel: Recent Labs  Lab 08/12/19 0540  NA 137  K 4.7  CL 107  CO2 22  GLUCOSE 200*  BUN 43*  CREATININE 1.95*  CALCIUM 9.3   GFR: Estimated Creatinine Clearance: 28.8 mL/min (A) (by C-G formula based on SCr of 1.95 mg/dL  (H)). Liver Function Tests: No results for input(s): AST, ALT, ALKPHOS, BILITOT, PROT, ALBUMIN in the last 168 hours. No results for input(s): LIPASE, AMYLASE in the last 168 hours. No results for input(s): AMMONIA in the last 168 hours. Coagulation Profile: No results for input(s): INR, PROTIME in the last 168 hours. Cardiac Enzymes: No results for input(s): CKTOTAL, CKMB, CKMBINDEX, TROPONINI in the last 168 hours. BNP (last 3 results) No results for input(s): PROBNP in the last 8760 hours. HbA1C: No results for input(s): HGBA1C in the last 72 hours. CBG: Recent Labs  Lab 08/12/19 1826 08/12/19 2030 08/13/19 0009 08/13/19 0416 08/13/19 0735  GLUCAP 122* 192* 207* 128* 81   Lipid Profile: No results for input(s): CHOL, HDL, LDLCALC, TRIG, CHOLHDL, LDLDIRECT in the last 72 hours. Thyroid Function Tests: No results for input(s): TSH, T4TOTAL, FREET4, T3FREE, THYROIDAB in the last 72 hours. Anemia Panel: No results for input(s): VITAMINB12, FOLATE, FERRITIN, TIBC, IRON, RETICCTPCT in the last 72 hours. Sepsis Labs: No results for input(s): PROCALCITON, LATICACIDVEN in the last 168 hours.  Recent Results (from the past 240 hour(s))  SARS Coronavirus 2 by RT PCR (hospital order, performed in [facility] hospital lab) Nasopharyngeal Nasopharyngeal Swab     Status: None   Collection Time: 08/12/19  5:40 AM   Specimen: Nasopharyngeal Swab  Result Value Ref Range Status   SARS Coronavirus 2 NEGATIVE NEGATIVE Final    Comment: (NOTE) SARS-CoV-2 target nucleic acids are NOT DETECTED.  The SARS-CoV-2 RNA is generally detectable in upper and lower respiratory specimens during the acute phase of infection. The lowest concentration of SARS-CoV-2 viral copies this assay can detect is 250 copies / mL. A negative result does not preclude SARS-CoV-2 infection and should not be used as the sole basis for treatment or other patient management decisions.  A negative result may occur  with improper specimen collection / handling, submission of specimen other than nasopharyngeal swab, presence of viral mutation(s) within the areas targeted by this assay, and inadequate number of viral copies (<250 copies / mL). A negative result must be combined with clinical observations, patient history, and epidemiological information.  Fact Sheet for Patients:   StrictlyIdeas.no  Fact Sheet for Healthcare Providers: BankingDealers.co.za  This test is not yet approved or  cleared by the [place] FDA and has been authorized for detection and/or diagnosis of SARS-CoV-2 by FDA under an Emergency Use Authorization (EUA).  This EUA will remain in effect (meaning this test can be used) for the duration of the COVID-19 declaration under Section 564(b)(1) of the Act, 21 U.S.C. section 360bbb-3(b)(1), unless the authorization is terminated or revoked sooner.  Performed at [facility], [place] [address] [place], [place] [address]   MRSA PCR Screening     Status: None   Collection Time: 08/13/19  5:50 AM  Specimen: Nasal Mucosa; Nasopharyngeal  Result Value Ref Range Status   MRSA by PCR NEGATIVE NEGATIVE Final    Comment:        The GeneXpert MRSA Assay (FDA approved for NASAL specimens only), is one component of a comprehensive MRSA colonization surveillance program. It is not intended to diagnose MRSA infection nor to guide or monitor treatment for MRSA infections. Performed at [facility], [place] [address], [place], [place] [address]          Radiology Studies: DG Ankle Complete Right  Result Date: 08/12/2019 CLINICAL DATA:  86 year old male status post fall with pain. EXAM: RIGHT ANKLE - COMPLETE 3+ VIEW COMPARISON:  None. FINDINGS: Oblique mildly comminuted fracture through the distal right fibula metadiaphysis. Mild posterior and lateral displacement of the fibula. Widening of the right  lateral mortise joint space, as well as the tibia fibular syndesmosis. Superimposed more linear fracture through the medial malleolus, nondisplaced. The posterior malleolus appears to remain intact. Talar dome intact. Calcaneus and other visible bones of the right foot appear intact. IMPRESSION: 1. Mildly displaced and comminuted fracture of the distal right fibula metadiaphysis with suspicion of injury to the tibiofibular syndesmosis. 2. Nondisplaced fracture through the medial malleolus. Electronically Signed   By: Genevie Ann M.D.   On: 08/12/2019 03:53   DG CHEST PORT 1 VIEW  Result Date: 08/12/2019 CLINICAL DATA:  Preop ankle surgery EXAM: PORTABLE CHEST 1 VIEW COMPARISON:  None. FINDINGS: Mild left basilar scarring/atelectasis. Lungs are otherwise clear. No pleural effusion or pneumothorax. Heart is normal in size. Surgical clips along the left neck. IMPRESSION: No evidence of acute cardiopulmonary disease. Electronically Signed   By: Julian Hy M.D.   On: 08/12/2019 07:37        Scheduled Meds: . [MAR Hold] amLODipine  5 mg Oral Daily  . [MAR Hold] atorvastatin  80 mg Oral q1800  . [MAR Hold] docusate sodium  100 mg Oral BID  . [MAR Hold] famotidine  20 mg Oral QHS  . [MAR Hold] insulin aspart  0-9 Units Subcutaneous Q4H  . [MAR Hold] levothyroxine  125 mcg Oral QAC breakfast  . [MAR Hold] senna  1 tablet Oral Daily  . [MAR Hold] timolol  1 drop Both Eyes BID   Continuous Infusions: . sodium chloride 40 mL/hr at 08/13/19 0759     LOS: 1 day    Time spent: 35 minutes.     Elmarie Shiley, MD [facility]   If 7PM-7AM, please contact night-coverage [email or web address]  08/13/2019, 8:46 AM

## 2019-08-13 NOTE — Plan of Care (Signed)
  Problem: Health Behavior/Discharge Planning: Goal: Ability to manage health-related needs will improve Outcome: Progressing   Problem: Clinical Measurements: Goal: Ability to maintain clinical measurements within normal limits will improve Outcome: Progressing Goal: Will remain free from infection Outcome: Progressing   

## 2019-08-14 LAB — URINALYSIS, ROUTINE W REFLEX MICROSCOPIC
Bacteria, UA: NONE SEEN
Bilirubin Urine: NEGATIVE
Glucose, UA: >=500 mg/dL — AB
Hgb urine dipstick: NEGATIVE
Ketones, ur: NEGATIVE mg/dL
Leukocytes,Ua: NEGATIVE
Nitrite: NEGATIVE
Protein, ur: 30 mg/dL — AB
Specific Gravity, Urine: 1.017 (ref 1.005–1.030)
pH: 5 (ref 5.0–8.0)

## 2019-08-14 LAB — BASIC METABOLIC PANEL
Anion gap: 9 (ref 5–15)
BUN: 41 mg/dL — ABNORMAL HIGH (ref 8–23)
CO2: 23 mmol/L (ref 22–32)
Calcium: 9.7 mg/dL (ref 8.9–10.3)
Chloride: 105 mmol/L (ref 98–111)
Creatinine, Ser: 2.1 mg/dL — ABNORMAL HIGH (ref 0.61–1.24)
GFR calc Af Amer: 31 mL/min — ABNORMAL LOW (ref >60)
GFR calc non Af Amer: 27 mL/min — ABNORMAL LOW (ref >60)
Glucose, Bld: 160 mg/dL — ABNORMAL HIGH (ref 70–99)
Potassium: 5.1 mmol/L (ref 3.5–5.1)
Sodium: 137 mmol/L (ref 135–145)

## 2019-08-14 LAB — CBC
HCT: 33.1 % — ABNORMAL LOW (ref 39.0–52.0)
Hemoglobin: 11.6 g/dL — ABNORMAL LOW (ref 13.0–17.0)
MCH: 31.2 pg (ref 26.0–34.0)
MCHC: 35 g/dL (ref 30.0–36.0)
MCV: 89 fL (ref 80.0–100.0)
Platelets: 183 10*3/uL (ref 150–400)
RBC: 3.72 MIL/uL — ABNORMAL LOW (ref 4.22–5.81)
RDW: 13.9 % (ref 11.5–15.5)
WBC: 9.3 10*3/uL (ref 4.0–10.5)
nRBC: 0 % (ref 0.0–0.2)

## 2019-08-14 LAB — GLUCOSE, CAPILLARY
Glucose-Capillary: 203 mg/dL — ABNORMAL HIGH (ref 70–99)
Glucose-Capillary: 134 mg/dL — ABNORMAL HIGH (ref 70–99)
Glucose-Capillary: 246 mg/dL — ABNORMAL HIGH (ref 70–99)
Glucose-Capillary: 262 mg/dL — ABNORMAL HIGH (ref 70–99)
Glucose-Capillary: 300 mg/dL — ABNORMAL HIGH (ref 70–99)

## 2019-08-14 MED ORDER — INSULIN GLARGINE 100 UNIT/ML ~~LOC~~ SOLN
20.0000 [IU] | Freq: Two times a day (BID) | SUBCUTANEOUS | Status: DC
  Administered 2019-08-14 – 2019-08-22 (×17): 20 [IU] via SUBCUTANEOUS
  Filled 2019-08-14 (×18): qty 0.2

## 2019-08-14 MED ORDER — SODIUM CHLORIDE 0.9 % IV SOLN: INTRAVENOUS | Status: DC

## 2019-08-14 MED ORDER — ASPIRIN 325 MG PO TABS
325.0000 mg | ORAL_TABLET | Freq: Two times a day (BID) | ORAL | Status: DC
  Filled 2019-08-14: qty 1

## 2019-08-14 MED ORDER — ENOXAPARIN SODIUM 30 MG/0.3ML ~~LOC~~ SOLN
30.0000 mg | SUBCUTANEOUS | Status: DC
  Administered 2019-08-14 – 2019-08-21 (×8): 30 mg via SUBCUTANEOUS
  Filled 2019-08-14 (×8): qty 0.3

## 2019-08-14 MED ORDER — GLUCERNA SHAKE PO LIQD
237.0000 mL | Freq: Two times a day (BID) | ORAL | Status: DC
  Administered 2019-08-14 – 2019-08-17 (×6): 237 mL via ORAL

## 2019-08-14 NOTE — Evaluation (Addendum)
Physical Therapy Evaluation Patient Details Name: Martin Richardson MRN: [id number] DOB: [birth year] Today's Date: 08/14/2019   History of Present Illness  Patient is a 86 y/o male who presents with right ankle fx s/p fall. Now s/p ORIF right ankle 7/17. PMH includes stroke, HTN, DM, glaucoma, CKD, left eye blindness.  Clinical Impression  Patient presents with decreased sensation RLE and post surgical deficits s/p above surgery. Pt is a highly functioning 86 y/o male who lives alone and has been Mod I for ADLs and ambulation PTA. Has support of daughter who assists with some IADLs but not as much recently. Pt still drives. Today, pt requires Mod A for standing and Min A of 2 for gait training with use of RW. Able to maintain NMB RLE with gait training. Instructed pt in there ex and the importance of elevation. There is about 100' from elevator to get to his condo door so most likely will need w/c for longer distance and household use due to fatigue with hopping during gait. Would benefit from CIR to maximize independence and mobility prior to return home. Would be a great CIR candidate, highly motivated to return to PLOF. Will follow acutely. OT consult placed.     Follow Up Recommendations CIR;Supervision for mobility/OOB    Equipment Recommendations  Rolling walker with 5" wheels;Wheelchair cushion (measurements PT);Wheelchair (measurements PT) (pending disposition)    Recommendations for Other Services Rehab consult;OT consult     Precautions / Restrictions        Mobility  Bed Mobility Overal bed mobility: Modified Independent             General bed mobility comments: HOB elevated and use of rail.  Transfers Overall transfer level: Needs assistance Equipment used: Rolling walker (2 wheeled) Transfers: Sit to/from Stand Sit to Stand: Mod assist;From elevated surface         General transfer comment: Assist to power to standing with cues for hand placement and technique.  Stood from [employer]. Difficulty maintaining NWB initially but once upright able to sustain. transferred to chair post ambulation.  Ambulation/Gait Ambulation/Gait assistance: Min assist;+2 safety/equipment Gait Distance (Feet): 10 Feet Assistive device: Rolling walker (2 wheeled) Gait Pattern/deviations:  ("hop to") Gait velocity: decreased Gait velocity interpretation: <1.31 ft/sec, indicative of household ambulator General Gait Details: "hop to" gait pattern with use of RW for support; able to maintain NWB RLE. Fatigues. Chair follow for safety.  Stairs            Wheelchair Mobility    Modified Rankin (Stroke Patients Only)       Balance Overall balance assessment: Needs assistance Sitting-balance support: Feet supported;No upper extremity supported Sitting balance-[name]: Good     Standing balance support: During functional activity Standing balance-[name]: Poor Standing balance comment: Requires UE support and external support at times.                             Pertinent Vitals/Pain Pain Assessment: No/denies pain    Home Living Family/patient expects to be discharged to:: Private residence Living Arrangements: Alone Available Help at Discharge: Family;Available PRN/intermittently Type of Home: Other(Comment) (condo) Home Access: Elevator     Home Layout: One level Home Equipment: Shower seat - built in;Toilet riser;Walker - 2 wheels;Cane - single point      Prior Function Level of Independence: Independent         Comments: Drives, does ADLs and daughter helps with some  IADLs.     Hand Dominance        Extremity/Trunk Assessment   Upper Extremity Assessment Upper Extremity Assessment: Defer to OT evaluation    Lower Extremity Assessment Lower Extremity Assessment: Overall WFL for tasks assessed;RLE deficits/detail;LLE deficits/detail RLE Deficits / Details: Able to perform SLR without difficulty. RLE Sensation:  decreased light touch;history of peripheral neuropathy LLE Sensation: history of peripheral neuropathy       Communication   Communication: No difficulties  Cognition Arousal/Alertness: Awake/alert Behavior During Therapy: WFL for tasks assessed/performed Overall Cognitive Status: Impaired/Different from baseline Area of Impairment: Memory                     Memory: Decreased short-term memory         General Comments: Daughter in room, pt does not recall conversations regarding dispo that daughter states have happened, [facility] for basic mobility tasks.      General Comments General comments (skin integrity, edema, etc.): Daughter present during session.    Exercises General Exercises - Lower Extremity Quad Sets: AROM;Both;10 reps;Seated (3-5 second hold) Hip ABduction/ADduction: AROM;Right;Seated;5 reps Straight Leg Raises: Strengthening;Right;Seated;5 reps Other Exercises Other Exercises: Instructed pt in chair push ups to offload bottom and for strengthening.   Assessment/Plan    PT Assessment Patient needs continued PT services  PT Problem List Decreased mobility;Decreased range of motion;Impaired sensation;Decreased balance;Decreased knowledge of use of DME;Decreased activity tolerance;Decreased knowledge of precautions       PT Treatment Interventions Therapeutic activities;Gait training;Therapeutic exercise;Patient/family education;Wheelchair mobility training;Balance training;Functional mobility training;DME instruction    PT Goals (Current goals can be found in the Care Plan section)  Acute Rehab PT Goals Patient Stated Goal: to get to rehab and return to PLOF PT Goal Formulation: With patient/family Time For Goal Achievement: 08/28/19 Potential to Achieve Goals: Good    Frequency Min 3X/week   Barriers to discharge Decreased caregiver support;Inaccessible home environment lives alone; 100 feet to get to door of apt when off elevator     Co-evaluation               AM-PAC PT "6 Clicks" Mobility  Outcome Measure Help needed turning from your back to your side while in a flat bed without using bedrails?: None Help needed moving from lying on your back to sitting on the side of a flat bed without using bedrails?: A Little Help needed moving to and from a bed to a chair (including a wheelchair)?: A Lot Help needed standing up from a chair using your arms (e.g., wheelchair or bedside chair)?: A Lot Help needed to walk in hospital room?: A Little Help needed climbing 3-5 steps with a railing? : Total 6 Click Score: 15    End of Session Equipment Utilized During Treatment: Gait belt Activity Tolerance: Patient tolerated treatment well Patient left: in chair;with call bell/phone within reach;with chair alarm set;with family/visitor present Nurse Communication: Mobility status PT Visit Diagnosis: Unsteadiness on feet (R26.81);Difficulty in walking, not elsewhere classified (R26.2)    Time: 0932-6712 PT Time Calculation (min) (ACUTE ONLY): 33 min   Charges:   PT Evaluation $PT Eval Moderate Complexity: 1 Mod PT Treatments $Gait Training: 8-22 mins        Marisa Severin, PT, DPT Acute Rehabilitation Services Pager [phone] Office [place] 08/14/2019, 1:11 PM

## 2019-08-14 NOTE — Plan of Care (Signed)

## 2019-08-14 NOTE — Social Work (Signed)
CSW acknowledging consult for HH/DME/SNF placement. Will follow for therapy recommendations needed to best determine disposition/for insurance authorization. Per notes active bedrest order in place.   Westley Hummer, MSW, [place] Work

## 2019-08-14 NOTE — Progress Notes (Signed)
Orthopedic Tech Progress Note Patient Details:  Martin Richardson [facility] Jun 09, 1925 [id number]  Ortho Devices Type of Ortho Device: Crutches Ortho Device/Splint Location: RLE Ortho Device/Splint Interventions: Ordered   Post Interventions Patient Tolerated: Other (comment) Instructions Provided: Care of device, Poper ambulation with device, Adjustment of device   Martin Richardson 08/14/2019, 3:35 PM

## 2019-08-14 NOTE — Progress Notes (Signed)
Subjective: 1 Day Post-Op s/p Procedure(s): OPEN REDUCTION INTERNAL FIXATION (ORIF) ANKLE FRACTURE  Unable to feel or move right foot this morning, otherwise no complaints. Patient and family are very interested in the possibility of CIR on discharge.   Objective:  PE: VITALS:   Vitals:   08/13/19 2016 08/13/19 2354 08/14/19 0414 08/14/19 0803  BP: 110/72 111/65 135/71 134/76  Pulse: (!) 109 70 62 (!) 57  Resp: 18 17 16 17   Temp: (!) 97.4 F (36.3 C) (!) 97.5 F (36.4 C) (!) 97.5 F (36.4 C) (!) 97.4 F (36.3 C)  TempSrc: Oral Oral Oral Oral  SpO2: 100% 96% 100% 100%  Weight:      Height:       Gen: Sitting up in chair, in no acute distress MSK: RLE  In splint. No sensation or motor function in toes of right foot yet. Calf compressible and non-tender. No TTP to knee.    LABS  Results for orders placed or performed during the hospital encounter of 08/12/19 (from the past 24 hour(s))  CBC     Status: Abnormal   Collection Time: 08/13/19 12:04 PM  Result Value Ref Range   WBC 8.1 4.0 - 10.5 K/uL   RBC 3.81 (L) 4.22 - 5.81 MIL/uL   Hemoglobin 12.1 (L) 13.0 - 17.0 g/dL   HCT 34.5 (L) 39 - 52 %   MCV 90.6 80.0 - 100.0 fL   MCH 31.8 26.0 - 34.0 pg   MCHC 35.1 30.0 - 36.0 g/dL   RDW 14.2 11.5 - 15.5 %   Platelets 173 150 - 400 K/uL   nRBC 0.0 0.0 - 0.2 %  Basic metabolic panel     Status: Abnormal   Collection Time: 08/13/19 12:04 PM  Result Value Ref Range   Sodium 135 135 - 145 mmol/L   Potassium 4.9 3.5 - 5.1 mmol/L   Chloride 105 98 - 111 mmol/L   CO2 23 22 - 32 mmol/L   Glucose, Bld 233 (H) 70 - 99 mg/dL   BUN 32 (H) 8 - 23 mg/dL   Creatinine, Ser 1.94 (H) 0.61 - 1.24 mg/dL   Calcium 9.1 8.9 - 10.3 mg/dL   GFR calc non Af Amer 29 (L) >60 mL/min   GFR calc Af Amer 34 (L) >60 mL/min   Anion gap 7 5 - 15  Glucose, capillary     Status: Abnormal   Collection Time: 08/13/19 12:07 PM  Result Value Ref Range   Glucose-Capillary 190 (H) 70 - 99 mg/dL    Glucose, capillary     Status: Abnormal   Collection Time: 08/13/19  4:37 PM  Result Value Ref Range   Glucose-Capillary 363 (H) 70 - 99 mg/dL  Glucose, capillary     Status: Abnormal   Collection Time: 08/13/19  8:11 PM  Result Value Ref Range   Glucose-Capillary 365 (H) 70 - 99 mg/dL   Comment 1 Notify RN   Glucose, capillary     Status: Abnormal   Collection Time: 08/13/19 11:50 PM  Result Value Ref Range   Glucose-Capillary 300 (H) 70 - 99 mg/dL  Glucose, capillary     Status: Abnormal   Collection Time: 08/14/19  4:11 AM  Result Value Ref Range   Glucose-Capillary 203 (H) 70 - 99 mg/dL  Glucose, capillary     Status: Abnormal   Collection Time: 08/14/19  8:04 AM  Result Value Ref Range   Glucose-Capillary 134 (H) 70 - 99 mg/dL  CBC     Status: Abnormal   Collection Time: 08/14/19  8:09 AM  Result Value Ref Range   WBC 9.3 4.0 - 10.5 K/uL   RBC 3.72 (L) 4.22 - 5.81 MIL/uL   Hemoglobin 11.6 (L) 13.0 - 17.0 g/dL   HCT 33.1 (L) 39 - 52 %   MCV 89.0 80.0 - 100.0 fL   MCH 31.2 26.0 - 34.0 pg   MCHC 35.0 30.0 - 36.0 g/dL   RDW 13.9 11.5 - 15.5 %   Platelets 183 150 - 400 K/uL   nRBC 0.0 0.0 - 0.2 %  Basic metabolic panel     Status: Abnormal   Collection Time: 08/14/19  8:09 AM  Result Value Ref Range   Sodium 137 135 - 145 mmol/L   Potassium 5.1 3.5 - 5.1 mmol/L   Chloride 105 98 - 111 mmol/L   CO2 23 22 - 32 mmol/L   Glucose, Bld 160 (H) 70 - 99 mg/dL   BUN 41 (H) 8 - 23 mg/dL   Creatinine, Ser 2.10 (H) 0.61 - 1.24 mg/dL   Calcium 9.7 8.9 - 10.3 mg/dL   GFR calc non Af Amer 27 (L) >60 mL/min   GFR calc Af Amer 31 (L) >60 mL/min   Anion gap 9 5 - 15    No results found.  Assessment/Plan: Principal Problem:   Ankle fracture Active Problems:   CKD (chronic kidney disease), stage IV (HCC)   DM (diabetes mellitus), type 2 with renal complications (HCC)   History of stroke   Essential hypertension   Closed right ankle fracture   Closed right ankle  fracture - 1 day post-op ORIF, no sensation or motor function today likely due block not having worn off yet, will continue to follow Weightbearing: NWB RLE, bed rest order d/c'd so patient can perform PT Insicional and dressing care: continue splint VTE prophylaxis: aspirin, SCDs Pain control: continue current regimen  Follow - up plan: Follow-up with Dr. Percell Miller 2 weeks post-op Dispo: pending PT eval, family reports discussion regarding CIR placement, bed rest order d/c'd  Contact information:   Weekdays 8-5 Merlene Pulling, [place] [phone] A fter hours and holidays please check [email or web address] for group call information for [facility]  Ventura Bruns 08/14/2019, 9:54 AM

## 2019-08-14 NOTE — Progress Notes (Signed)
PT Cancellation Note  Patient Details Name: [name] MRN: [id number] DOB: 11-23-1925   Cancelled Treatment:    Reason Eval/Treat Not Completed: Active bedrest order Awaiting increase in activity orders prior to PT evaluation. Will follow.   Marguarite Arbour A Nami Strawder 08/14/2019, 7:09 AM Marisa Severin, PT, DPT Acute Rehabilitation Services Pager [phone] Office [phone]

## 2019-08-14 NOTE — Progress Notes (Signed)
PROGRESS NOTE    Martin Richardson  GDJ:[id number] DOB: May 07, 1925 DOA: 08/12/2019 PCP: [name]   Brief Narrative: 86 year old with past medical history significant for diabetes type 2, CKD stage IV, anemia, hypertension, history of stroke who had a fall at home when he was walking to the bathroom.  Patient presented after mechanical fall he was found to have ankle fracture.  Patient underwent open reduction and internal fixation of right ankle by Dr. Percell Miller on 7/17.   Assessment & Plan:   Principal Problem:   Ankle fracture Active Problems:   CKD (chronic kidney disease), stage IV (HCC)   DM (diabetes mellitus), type 2 with renal complications (HCC)   History of stroke   Essential hypertension   Closed right ankle fracture  1-Right ankle fracture: Status post open reduction and internal fixation of right ankle fracture. PT per Ortho, recommending CIR>  Bowel regimen ordered. Had BM  Pain medications available. Incentive spirometry Discussed with Dr Percell Miller, plan for lovenox for couples of weeks for DVT prophylaxis   2-Hypertension: SBP soft, hold norvasc.   3-Diabetes type 2 with hypoglycemia. CBG increasing, start lantus 20 units BID>  SSI.   4-CKD stage IV: Creatinine stable at 1.9 No known prior baseline.  Increase to 2.  Continue with IV fluids.  Strict I and o  Request records.   5-Constipation: Resume home dose MiraLAX, continue with Senokot Had BM 6-anemia: Could be from renal disease.  Monitor hemoglobin.   Estimated body mass index is 31.85 kg/m as calculated from the following:   Height as of this encounter: 5\' 10"  (1.778 m).   Weight as of this encounter: 100.7 kg.   DVT prophylaxis: DVT per Ortho Code Status: Full code Family Communication: Care discussed with daughters who were at bedside 7/17 Disposition Plan:  Status is: Inpatient  Remains inpatient appropriate because:Ongoing active pain requiring inpatient pain  management   Dispo: The patient is from: Home              Anticipated d/c is to: To be determined              Anticipated d/c date is: 2 days              Patient currently is not medically stable to d/c.        Consultants:   Percell Miller  Procedures:   open reduction and internal fixation of right ankle by Dr. Percell Miller on 7/17.    Antimicrobials:  Cefazolin prophylaxis as per surgery  Subjective: He is sitting recliner.  Feeling well Had BM   Objective: Vitals:   08/13/19 2016 08/13/19 2354 08/14/19 0414 08/14/19 0803  BP: 110/72 111/65 135/71 134/76  Pulse: (!) 109 70 62 (!) 57  Resp: 18 17 16 17   Temp: (!) 97.4 F (36.3 C) (!) 97.5 F (36.4 C) (!) 97.5 F (36.4 C) (!) 97.4 F (36.3 C)  TempSrc: Oral Oral Oral Oral  SpO2: 100% 96% 100% 100%  Weight:      Height:        Intake/Output Summary (Last 24 hours) at 08/14/2019 1515 Last data filed at 08/14/2019 0900 Gross per 24 hour  Intake 480 ml  Output 700 ml  Net -220 ml   Filed Weights   08/12/19 0328  Weight: 100.7 kg    Examination:  General exam: NAD Respiratory system: CTA Cardiovascular system: S, 1  S 2 RRR Gastrointestinal system: BS present, soft, nt Central nervous system:  Alert  and conversant Extremities: Symmetric 5 x 5 power.  Right lower extremity with dressing  Data Reviewed: I have personally reviewed following labs and imaging studies  CBC: Recent Labs  Lab 08/12/19 0540 08/13/19 1204 08/14/19 0809  WBC 11.2* 8.1 9.3  HGB 12.5* 12.1* 11.6*  HCT 35.6* 34.5* 33.1*  MCV 91.0 90.6 89.0  PLT 189 173 500   Basic Metabolic Panel: Recent Labs  Lab 08/12/19 0540 08/13/19 1204 08/14/19 0809  NA 137 135 137  K 4.7 4.9 5.1  CL 107 105 105  CO2 22 23 23   GLUCOSE 200* 233* 160*  BUN 43* 32* 41*  CREATININE 1.95* 1.94* 2.10*  CALCIUM 9.3 9.1 9.7   GFR: Estimated Creatinine Clearance: 26.7 mL/min (A) (by C-G formula based on SCr of 2.1 mg/dL (H)). Liver Function Tests: No  results for input(s): AST, ALT, ALKPHOS, BILITOT, PROT, ALBUMIN in the last 168 hours. No results for input(s): LIPASE, AMYLASE in the last 168 hours. No results for input(s): AMMONIA in the last 168 hours. Coagulation Profile: No results for input(s): INR, PROTIME in the last 168 hours. Cardiac Enzymes: No results for input(s): CKTOTAL, CKMB, CKMBINDEX, TROPONINI in the last 168 hours. BNP (last 3 results) No results for input(s): PROBNP in the last 8760 hours. HbA1C: No results for input(s): HGBA1C in the last 72 hours. CBG: Recent Labs  Lab 08/13/19 2011 08/13/19 2350 08/14/19 0411 08/14/19 0804 08/14/19 1206  GLUCAP 365* 300* 203* 134* 246*   Lipid Profile: No results for input(s): CHOL, HDL, LDLCALC, TRIG, CHOLHDL, LDLDIRECT in the last 72 hours. Thyroid Function Tests: No results for input(s): TSH, T4TOTAL, FREET4, T3FREE, THYROIDAB in the last 72 hours. Anemia Panel: No results for input(s): VITAMINB12, FOLATE, FERRITIN, TIBC, IRON, RETICCTPCT in the last 72 hours. Sepsis Labs: No results for input(s): PROCALCITON, LATICACIDVEN in the last 168 hours.  Recent Results (from the past 240 hour(s))  SARS Coronavirus 2 by RT PCR (hospital order, performed in [facility] hospital lab) Nasopharyngeal Nasopharyngeal Swab     Status: None   Collection Time: 08/12/19  5:40 AM   Specimen: Nasopharyngeal Swab  Result Value Ref Range Status   SARS Coronavirus 2 NEGATIVE NEGATIVE Final    Comment: (NOTE) SARS-CoV-2 target nucleic acids are NOT DETECTED.  The SARS-CoV-2 RNA is generally detectable in upper and lower respiratory specimens during the acute phase of infection. The lowest concentration of SARS-CoV-2 viral copies this assay can detect is 250 copies / mL. A negative result does not preclude SARS-CoV-2 infection and should not be used as the sole basis for treatment or other patient management decisions.  A negative result may occur with improper specimen collection /  handling, submission of specimen other than nasopharyngeal swab, presence of viral mutation(s) within the areas targeted by this assay, and inadequate number of viral copies (<250 copies / mL). A negative result must be combined with clinical observations, patient history, and epidemiological information.  Fact Sheet for Patients:   StrictlyIdeas.no  Fact Sheet for Healthcare Providers: BankingDealers.co.za  This test is not yet approved or  cleared by the [place] FDA and has been authorized for detection and/or diagnosis of SARS-CoV-2 by FDA under an Emergency Use Authorization (EUA).  This EUA will remain in effect (meaning this test can be used) for the duration of the COVID-19 declaration under Section 564(b)(1) of the Act, 21 U.S.C. section 360bbb-3(b)(1), unless the authorization is terminated or revoked sooner.  Performed at [facility], [address]  [place], [place] [address]   MRSA PCR Screening     Status: None   Collection Time: 08/13/19  5:50 AM   Specimen: Nasal Mucosa; Nasopharyngeal  Result Value Ref Range Status   MRSA by PCR NEGATIVE NEGATIVE Final    Comment:        The GeneXpert MRSA Assay (FDA approved for NASAL specimens only), is one component of a comprehensive MRSA colonization surveillance program. It is not intended to diagnose MRSA infection nor to guide or monitor treatment for MRSA infections. Performed at [facility], [place] [address], [place], [place] [address]          Radiology Studies: No results found.      Scheduled Meds:  atorvastatin  80 mg Oral q1800   docusate sodium  100 mg Oral BID   enoxaparin (LOVENOX) injection  30 mg Subcutaneous Q24H   famotidine  20 mg Oral QHS   feeding supplement (GLUCERNA SHAKE)  237 mL Oral BID BM   insulin aspart  0-9 Units Subcutaneous Q4H   insulin glargine  20 Units Subcutaneous BID   levothyroxine   125 mcg Oral QAC breakfast   nutrition supplement (JUVEN)  1 packet Oral BID BM   polyethylene glycol  17 g Oral BID   senna  1 tablet Oral Daily   timolol  1 drop Both Eyes BID   Continuous Infusions:  sodium chloride 50 mL/hr at 08/14/19 1256     LOS: 2 days    Time spent: 35 minutes.     Elmarie Shiley, MD [facility]   If 7PM-7AM, please contact night-coverage [email or web address]  08/14/2019, 3:15 PM

## 2019-08-14 NOTE — Plan of Care (Signed)

## 2019-08-15 ENCOUNTER — Encounter (HOSPITAL_COMMUNITY): Payer: Self-pay | Admitting: Orthopedic Surgery

## 2019-08-15 ENCOUNTER — Other Ambulatory Visit: Payer: Self-pay

## 2019-08-15 LAB — CBC
HCT: 31.2 % — ABNORMAL LOW (ref 39.0–52.0)
Hemoglobin: 10.8 g/dL — ABNORMAL LOW (ref 13.0–17.0)
MCH: 30.9 pg (ref 26.0–34.0)
MCHC: 34.6 g/dL (ref 30.0–36.0)
MCV: 89.1 fL (ref 80.0–100.0)
Platelets: 170 10*3/uL (ref 150–400)
RBC: 3.5 MIL/uL — ABNORMAL LOW (ref 4.22–5.81)
RDW: 14 % (ref 11.5–15.5)
WBC: 5.5 10*3/uL (ref 4.0–10.5)
nRBC: 0 % (ref 0.0–0.2)

## 2019-08-15 LAB — BASIC METABOLIC PANEL
Anion gap: 5 (ref 5–15)
BUN: 48 mg/dL — ABNORMAL HIGH (ref 8–23)
CO2: 25 mmol/L (ref 22–32)
Calcium: 9.2 mg/dL (ref 8.9–10.3)
Chloride: 107 mmol/L (ref 98–111)
Creatinine, Ser: 2.12 mg/dL — ABNORMAL HIGH (ref 0.61–1.24)
GFR calc Af Amer: 30 mL/min — ABNORMAL LOW (ref >60)
GFR calc non Af Amer: 26 mL/min — ABNORMAL LOW (ref >60)
Glucose, Bld: 91 mg/dL (ref 70–99)
Potassium: 4.7 mmol/L (ref 3.5–5.1)
Sodium: 137 mmol/L (ref 135–145)

## 2019-08-15 LAB — GLUCOSE, CAPILLARY
Glucose-Capillary: 135 mg/dL — ABNORMAL HIGH (ref 70–99)
Glucose-Capillary: 151 mg/dL — ABNORMAL HIGH (ref 70–99)
Glucose-Capillary: 213 mg/dL — ABNORMAL HIGH (ref 70–99)
Glucose-Capillary: 217 mg/dL — ABNORMAL HIGH (ref 70–99)
Glucose-Capillary: 223 mg/dL — ABNORMAL HIGH (ref 70–99)
Glucose-Capillary: 86 mg/dL (ref 70–99)

## 2019-08-15 MED ORDER — SODIUM CHLORIDE 0.9 % IV SOLN: INTRAVENOUS | Status: AC

## 2019-08-15 NOTE — Progress Notes (Signed)
PROGRESS NOTE    Martin Richardson  IHK:[id number] DOB: 1925/10/24 DOA: 08/12/2019 PCP: [name]   Brief Narrative: 86 year old with past medical history significant for diabetes type 2, CKD stage IV, anemia, hypertension, history of stroke who had a fall at home when he was walking to the bathroom.  Patient presented after mechanical fall he was found to have ankle fracture.  Patient underwent open reduction and internal fixation of right ankle by Dr. Percell Miller on 7/17.   Assessment & Plan:   Principal Problem:   Ankle fracture Active Problems:   CKD (chronic kidney disease), stage IV (HCC)   DM (diabetes mellitus), type 2 with renal complications (HCC)   History of stroke   Essential hypertension   Closed right ankle fracture  1-Right ankle fracture: Status post open reduction and internal fixation of right ankle fracture. PT per Ortho, recommending CIR>  Bowel regimen ordered. Had BM  Pain medications available. Incentive spirometry Discussed with Dr Percell Miller, plan for lovenox for couples of weeks for DVT prophylaxis  PT, recommend CIR, awaiting CIR evaluation.   2-Hypertension: SBP soft, hold norvasc.   3-Diabetes type 2 with hypoglycemia. CBG increasing, start lantus 20 units BID>  SSI.   4-CKD stage IV: Creatinine stable at 1.9 No known prior baseline.  Increase to 2.  Good urine out put; 1.3 L yesterday Request records.  Continue with fluids for 4 more hours.   5-Constipation: Resume home dose MiraLAX, continue with Senokot Had BM 6-Anemia: Could be from renal disease.  Monitor hemoglobin.   Estimated body mass index is 31.85 kg/m as calculated from the following:   Height as of this encounter: 5\' 10"  (1.778 m).   Weight as of this encounter: 100.7 kg.   DVT prophylaxis: DVT per Ortho Code Status: Full code Family Communication: Care discussed with daughters who were at bedside 7/17 Disposition Plan:  Status is:  Inpatient  Remains inpatient appropriate because:Ongoing active pain requiring inpatient pain management   Dispo: The patient is from: Home              Anticipated d/c is to: To be determined              Anticipated d/c date is: 2 days              Patient currently is not medically stable to d/c.        Consultants:   Percell Miller  Procedures:   open reduction and internal fixation of right ankle by Dr. Percell Miller on 7/17.    Antimicrobials:  Cefazolin prophylaxis as per surgery  Subjective: He is feeling well, had BM yesterday.  Denies any dysuria.  He is interested in going to CIR>   Objective: Vitals:   08/14/19 1330 08/14/19 2020 08/15/19 0430 08/15/19 0829  BP: 133/75 133/65 140/68 115/61  Pulse: 65 (!) 59 (!) 57 70  Resp: 17 17 16 18   Temp: (!) 97.5 F (36.4 C) (!) 97.4 F (36.3 C) (!) 97.4 F (36.3 C) 98.5 F (36.9 C)  TempSrc: Oral Oral Oral Oral  SpO2: 99% 100% 100% 99%  Weight:      Height:        Intake/Output Summary (Last 24 hours) at 08/15/2019 1519 Last data filed at 08/15/2019 0500 Gross per 24 hour  Intake --  Output 1300 ml  Net -1300 ml   Filed Weights   08/12/19 0328  Weight: 100.7 kg    Examination:  General exam: NAD Respiratory system:  CTA Cardiovascular system: S 1, S 2 RRR Gastrointestinal system: BS present, soft, nt Central nervous system:  Alert and conversant Extremities: Symmetric 5 x 5 power.  Right lower extremity with dressing  Data Reviewed: I have personally reviewed following labs and imaging studies  CBC: Recent Labs  Lab 08/12/19 0540 08/13/19 1204 08/14/19 0809 08/15/19 0716  WBC 11.2* 8.1 9.3 5.5  HGB 12.5* 12.1* 11.6* 10.8*  HCT 35.6* 34.5* 33.1* 31.2*  MCV 91.0 90.6 89.0 89.1  PLT 189 173 183 546   Basic Metabolic Panel: Recent Labs  Lab 08/12/19 0540 08/13/19 1204 08/14/19 0809 08/15/19 0716  NA 137 135 137 137  K 4.7 4.9 5.1 4.7  CL 107 105 105 107  CO2 22 23 23 25   GLUCOSE 200* 233*  160* 91  BUN 43* 32* 41* 48*  CREATININE 1.95* 1.94* 2.10* 2.12*  CALCIUM 9.3 9.1 9.7 9.2   GFR: Estimated Creatinine Clearance: 26.4 mL/min (A) (by C-G formula based on SCr of 2.12 mg/dL (H)). Liver Function Tests: No results for input(s): AST, ALT, ALKPHOS, BILITOT, PROT, ALBUMIN in the last 168 hours. No results for input(s): LIPASE, AMYLASE in the last 168 hours. No results for input(s): AMMONIA in the last 168 hours. Coagulation Profile: No results for input(s): INR, PROTIME in the last 168 hours. Cardiac Enzymes: No results for input(s): CKTOTAL, CKMB, CKMBINDEX, TROPONINI in the last 168 hours. BNP (last 3 results) No results for input(s): PROBNP in the last 8760 hours. HbA1C: No results for input(s): HGBA1C in the last 72 hours. CBG: Recent Labs  Lab 08/14/19 2015 08/15/19 0011 08/15/19 0425 08/15/19 0757 08/15/19 1032  GLUCAP 300* 223* 135* 86 151*   Lipid Profile: No results for input(s): CHOL, HDL, LDLCALC, TRIG, CHOLHDL, LDLDIRECT in the last 72 hours. Thyroid Function Tests: No results for input(s): TSH, T4TOTAL, FREET4, T3FREE, THYROIDAB in the last 72 hours. Anemia Panel: No results for input(s): VITAMINB12, FOLATE, FERRITIN, TIBC, IRON, RETICCTPCT in the last 72 hours. Sepsis Labs: No results for input(s): PROCALCITON, LATICACIDVEN in the last 168 hours.  Recent Results (from the past 240 hour(s))  SARS Coronavirus 2 by RT PCR (hospital order, performed in [facility] hospital lab) Nasopharyngeal Nasopharyngeal Swab     Status: None   Collection Time: 08/12/19  5:40 AM   Specimen: Nasopharyngeal Swab  Result Value Ref Range Status   SARS Coronavirus 2 NEGATIVE NEGATIVE Final    Comment: (NOTE) SARS-CoV-2 target nucleic acids are NOT DETECTED.  The SARS-CoV-2 RNA is generally detectable in upper and lower respiratory specimens during the acute phase of infection. The lowest concentration of SARS-CoV-2 viral copies this assay can detect is 250 copies /  mL. A negative result does not preclude SARS-CoV-2 infection and should not be used as the sole basis for treatment or other patient management decisions.  A negative result may occur with improper specimen collection / handling, submission of specimen other than nasopharyngeal swab, presence of viral mutation(s) within the areas targeted by this assay, and inadequate number of viral copies (<250 copies / mL). A negative result must be combined with clinical observations, patient history, and epidemiological information.  Fact Sheet for Patients:   StrictlyIdeas.no  Fact Sheet for Healthcare Providers: BankingDealers.co.za  This test is not yet approved or  cleared by the [place] FDA and has been authorized for detection and/or diagnosis of SARS-CoV-2 by FDA under an Emergency Use Authorization (EUA).  This EUA will remain in effect (meaning this test can be used)  for the duration of the COVID-19 declaration under Section 564(b)(1) of the Act, 21 U.S.C. section 360bbb-3(b)(1), unless the authorization is terminated or revoked sooner.  Performed at [facility], [place] [address] [place], [place] [address]   MRSA PCR Screening     Status: None   Collection Time: 08/13/19  5:50 AM   Specimen: Nasal Mucosa; Nasopharyngeal  Result Value Ref Range Status   MRSA by PCR NEGATIVE NEGATIVE Final    Comment:        The GeneXpert MRSA Assay (FDA approved for NASAL specimens only), is one component of a comprehensive MRSA colonization surveillance program. It is not intended to diagnose MRSA infection nor to guide or monitor treatment for MRSA infections. Performed at [facility], [place] [address], [place], [place] [address]          Radiology Studies: No results found.      Scheduled Meds: . atorvastatin  80 mg Oral q1800  . docusate sodium  100 mg Oral BID  . enoxaparin (LOVENOX) injection  30  mg Subcutaneous Q24H  . famotidine  20 mg Oral QHS  . feeding supplement (GLUCERNA SHAKE)  237 mL Oral BID BM  . insulin aspart  0-9 Units Subcutaneous Q4H  . insulin glargine  20 Units Subcutaneous BID  . levothyroxine  125 mcg Oral QAC breakfast  . nutrition supplement (JUVEN)  1 packet Oral BID BM  . polyethylene glycol  17 g Oral BID  . senna  1 tablet Oral Daily  . timolol  1 drop Both Eyes BID   Continuous Infusions:    LOS: 3 days    Time spent: 35 minutes.     Elmarie Shiley, MD [facility]   If 7PM-7AM, please contact night-coverage [email or web address]  08/15/2019, 3:19 PM

## 2019-08-15 NOTE — Plan of Care (Signed)
  Problem: Education: Goal: Knowledge of General Education information will improve Description Including pain rating scale, medication(s)/side effects and non-pharmacologic comfort measures Outcome: Progressing   Problem: Health Behavior/Discharge Planning: Goal: Ability to manage health-related needs will improve Outcome: Progressing   

## 2019-08-15 NOTE — Evaluation (Signed)
Occupational Therapy Evaluation Patient Details Name: Martin Richardson MRN: [id number] DOB: 1925-09-26 Today's Date: 08/15/2019    History of Present Illness Patient is a 86 y/o male who presents with right ankle fx s/p fall. Now s/p ORIF right ankle 7/17. PMH includes stroke, HTN, DM, glaucoma, CKD, left eye blindness.   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 Present with unsteady gait with high risk for falls. He requires up to mod assist for OOB mobility and ADL. Pt likely to progress well with brief, intensive rehab. Will follow acutely.   Follow Up Recommendations  CIR    Equipment Recommendations  Wheelchair (measurements OT);Wheelchair cushion (measurements OT)    Recommendations for Other Services       Precautions / Restrictions Precautions Precautions: Fall Precaution Comments: pt with neuropathy and vertigo Restrictions Weight Bearing Restrictions: Yes RLE Weight Bearing: Non weight bearing      Mobility Bed Mobility Overal bed mobility: Modified Independent                Transfers Overall transfer level: Needs assistance Equipment used: Rolling walker (2 wheeled) Transfers: Sit to/from Stand Sit to Stand: Mod assist;From elevated surface         General transfer comment: cues for technique, assist to rise and steady    Balance Overall balance assessment: Needs assistance Sitting-balance support: Feet supported;No upper extremity supported Sitting balance-[name]: Good       Standing balance-[name]: Poor                             ADL either performed or assessed with clinical judgement   ADL Overall ADL's : Needs assistance/impaired Eating/Feeding: Independent   Grooming: Set up;Sitting;Wash/dry hands;Wash/dry face   Upper Body Bathing: Set up;Sitting   Lower Body Bathing: Moderate assistance;Sitting/lateral  leans   Upper Body Dressing : Set up;Sitting   Lower Body Dressing: Minimal assistance;Sitting/lateral leans   Toilet Transfer: Minimal assistance;Ambulation;BSC;RW   Toileting- Clothing Manipulation and Hygiene: Minimal assistance;Sitting/lateral lean       Functional mobility during ADLs: Minimal assistance;Rolling walker;Cueing for safety;Cueing for sequencing       Vision Baseline Vision/History: Legally blind;Wears glasses Wears Glasses: At all times Patient Visual Report: No change from baseline Additional Comments: blind in L eye     Perception     Praxis      Pertinent Vitals/Pain Pain Assessment: No/denies pain (nerve block still intact)     Hand Dominance Right   Extremity/Trunk Assessment Upper Extremity Assessment Upper Extremity Assessment: Overall WFL for tasks assessed (arthritic changes in hands)   Lower Extremity Assessment Lower Extremity Assessment: Defer to PT evaluation   Cervical / Trunk Assessment Cervical / Trunk Assessment: Normal   Communication Communication Communication: No difficulties   Cognition Arousal/Alertness: Awake/alert Behavior During Therapy: WFL for tasks assessed/performed Overall Cognitive Status: Impaired/Different from baseline Area of Impairment: Memory                     Memory: Decreased short-term memory             General Comments       Exercises     Shoulder Instructions      Home Living Family/patient expects to be discharged to:: Private residence Living Arrangements: Alone Available Help at Discharge: Family;Available PRN/intermittently Type of Home: Other(Comment) (condo) Home Access:  Elevator     Home Layout: One level     Bathroom Shower/Tub: Walk-in shower;Tub/shower unit;Door   [employer] Toilet: Handicapped height     Home Equipment: Shower seat - built in;Toilet riser;Walker - 2 wheels;Cane - single point          Prior Functioning/Environment Level of  Independence: Independent        Comments: Drives, does ADLs and daughter helps with some IADLs, pt was going to OPPT for tx of vertigo        OT Problem List: Decreased strength;Impaired balance (sitting and/or standing);Decreased knowledge of use of DME or AE;Decreased cognition      OT Treatment/Interventions: Self-care/ADL training;DME and/or AE instruction;Patient/family education;Therapeutic activities    OT Goals(Current goals can be found in the care plan section) Acute Rehab OT Goals Patient Stated Goal: to get to rehab and return to PLOF OT Goal Formulation: With patient Time For Goal Achievement: 08/29/19 Potential to Achieve Goals: Good ADL Goals Pt Will Perform Lower Body Bathing: with modified independence;sitting/lateral leans Pt Will Perform Lower Body Dressing: with modified independence;sitting/lateral leans Pt Will Transfer to Toilet: with modified independence;stand pivot transfer;bedside commode Pt Will Perform Toileting - Clothing Manipulation and hygiene: with modified independence;sitting/lateral leans Pt Will Perform Tub/Shower Transfer: with min assist;ambulating;shower seat;rolling walker Additional ADL Goal #1: Pt will propel w/c independently.  OT Frequency: Min 2X/week   Barriers to D/C:            Co-evaluation              AM-PAC OT "6 Clicks" Daily Activity     Outcome Measure Help from another person eating meals?: None Help from another person taking care of personal grooming?: A Little Help from another person toileting, which includes using toliet, bedpan, or urinal?: A Little Help from another person bathing (including washing, rinsing, drying)?: A Lot Help from another person to put on and taking off regular upper body clothing?: A Little Help from another person to put on and taking off regular lower body clothing?: A Little 6 Click Score: 18   End of Session Equipment Utilized During Treatment: Gait belt;Rolling  walker  Activity Tolerance: Patient tolerated treatment well Patient left: in chair;with call bell/phone within reach  OT Visit Diagnosis: Unsteadiness on feet (R26.81);Other abnormalities of gait and mobility (R26.89);Muscle weakness (generalized) (M62.81);History of falling (Z91.81)                Time: 2094-7096 OT Time Calculation (min): 37 min Charges:  OT General Charges $OT Visit: 1 Visit OT Evaluation $OT Eval Moderate Complexity: 1 Mod OT Treatments $Self Care/Home Management : 8-22 mins  Nestor Lewandowsky, OTR/L Acute Rehabilitation Services Pager: [phone] Office: [phone]  Malka So 08/15/2019, 11:51 AM

## 2019-08-15 NOTE — Progress Notes (Addendum)
Inpatient Rehab Admissions:  Inpatient Rehab Consult received.  I met with patient at the bedside for rehabilitation assessment and to discuss goals and expectations of an inpatient rehab admission.  He is extremely interested in CIR program, and states he will have good support from his daughters at discharge.  Feel he would be an excellent candidate due to independent prior level of function, support, and need to manage HTN, DM, and anemia.   I will confirm support and open insurance for prior auth today.    Signed: Shann Medal, PT, DPT Admissions Coordinator [phone] 08/15/19  4:00 PM

## 2019-08-15 NOTE — Plan of Care (Signed)

## 2019-08-15 NOTE — Progress Notes (Signed)
Subjective: 2 Days Post-Op s/p Procedure(s): OPEN REDUCTION INTERNAL FIXATION (ORIF) ANKLE FRACTURE  Patient states he is in no pain this morning, but he is starting to get feeling back in his right foot. No other complaints.   Objective:  PE: VITALS:   Vitals:   08/14/19 0414 08/14/19 0803 08/14/19 1330 08/14/19 2020  BP: 135/71 134/76 133/75 133/65  Pulse: 62 (!) 57 65 (!) 59  Resp: 16 17 17 17   Temp: (!) 97.5 F (36.4 C) (!) 97.4 F (36.3 C) (!) 97.5 F (36.4 C) (!) 97.4 F (36.3 C)  TempSrc: Oral Oral Oral Oral  SpO2: 100% 100% 99% 100%  Weight:      Height:       Gen: Sitting up in bed, In no acute distress Resp: no use of accessory musculature MSK: RLE in splint, able to move all toes of right foot. Sensation intact to all toes of right foot. Calf compressible and non-tender.    LABS  Results for orders placed or performed during the hospital encounter of 08/12/19 (from the past 24 hour(s))  Glucose, capillary     Status: Abnormal   Collection Time: 08/14/19  8:04 AM  Result Value Ref Range   Glucose-Capillary 134 (H) 70 - 99 mg/dL  CBC     Status: Abnormal   Collection Time: 08/14/19  8:09 AM  Result Value Ref Range   WBC 9.3 4.0 - 10.5 K/uL   RBC 3.72 (L) 4.22 - 5.81 MIL/uL   Hemoglobin 11.6 (L) 13.0 - 17.0 g/dL   HCT 33.1 (L) 39 - 52 %   MCV 89.0 80.0 - 100.0 fL   MCH 31.2 26.0 - 34.0 pg   MCHC 35.0 30.0 - 36.0 g/dL   RDW 13.9 11.5 - 15.5 %   Platelets 183 150 - 400 K/uL   nRBC 0.0 0.0 - 0.2 %  Basic metabolic panel     Status: Abnormal   Collection Time: 08/14/19  8:09 AM  Result Value Ref Range   Sodium 137 135 - 145 mmol/L   Potassium 5.1 3.5 - 5.1 mmol/L   Chloride 105 98 - 111 mmol/L   CO2 23 22 - 32 mmol/L   Glucose, Bld 160 (H) 70 - 99 mg/dL   BUN 41 (H) 8 - 23 mg/dL   Creatinine, Ser 2.10 (H) 0.61 - 1.24 mg/dL   Calcium 9.7 8.9 - 10.3 mg/dL   GFR calc non Af Amer 27 (L) >60 mL/min   GFR calc Af Amer 31 (L) >60 mL/min   Anion gap  9 5 - 15  Glucose, capillary     Status: Abnormal   Collection Time: 08/14/19 12:06 PM  Result Value Ref Range   Glucose-Capillary 246 (H) 70 - 99 mg/dL  Glucose, capillary     Status: Abnormal   Collection Time: 08/14/19  4:32 PM  Result Value Ref Range   Glucose-Capillary 262 (H) 70 - 99 mg/dL  Glucose, capillary     Status: Abnormal   Collection Time: 08/14/19  8:15 PM  Result Value Ref Range   Glucose-Capillary 300 (H) 70 - 99 mg/dL   Comment 1 Notify RN   Urinalysis, Routine w reflex microscopic     Status: Abnormal   Collection Time: 08/14/19  8:32 PM  Result Value Ref Range   Color, Urine YELLOW YELLOW   APPearance CLEAR CLEAR   Specific Gravity, Urine 1.017 1.005 - 1.030   pH 5.0 5.0 - 8.0   Glucose,  UA >=500 (A) NEGATIVE mg/dL   Hgb urine dipstick NEGATIVE NEGATIVE   Bilirubin Urine NEGATIVE NEGATIVE   Ketones, ur NEGATIVE NEGATIVE mg/dL   Protein, ur 30 (A) NEGATIVE mg/dL   Nitrite NEGATIVE NEGATIVE   Leukocytes,Ua NEGATIVE NEGATIVE   RBC / HPF 0-5 0 - 5 RBC/hpf   Bacteria, UA NONE SEEN NONE SEEN   Mucus PRESENT   Glucose, capillary     Status: Abnormal   Collection Time: 08/15/19 12:11 AM  Result Value Ref Range   Glucose-Capillary 223 (H) 70 - 99 mg/dL  Glucose, capillary     Status: Abnormal   Collection Time: 08/15/19  4:25 AM  Result Value Ref Range   Glucose-Capillary 135 (H) 70 - 99 mg/dL    No results found.  Assessment/Plan:  Closed right ankle fracture 2 Days Post-Op s/p Procedure(s): OPEN REDUCTION INTERNAL FIXATION (ORIF) ANKLE FRACTURE - doing well, block starting to wear off   Weightbearing: NWB RLE, up with therapy Insicional and dressing care: continue splint VTE prophylaxis: lovenox, SCD's Pain control: continue current regimen  Follow - up plan: Follow-up with Dr. Percell Miller 2 weeks post-op Dispo: PT recommending CIR, consult placed, ok to discharge to CIR from ortho standpoint when approved and bed available  Ortho will sign off,  please reach out to Dr. Percell Miller for any further questions or concerns  Contact information:   Weekdays 8-5 Merlene Pulling, PA-C [phone] A fter hours and holidays please check [email or web address] for group call information for [facility]  Ventura Bruns 08/15/2019, 7:16 AM

## 2019-08-16 LAB — GLUCOSE, CAPILLARY
Glucose-Capillary: 125 mg/dL — ABNORMAL HIGH (ref 70–99)
Glucose-Capillary: 161 mg/dL — ABNORMAL HIGH (ref 70–99)
Glucose-Capillary: 175 mg/dL — ABNORMAL HIGH (ref 70–99)
Glucose-Capillary: 246 mg/dL — ABNORMAL HIGH (ref 70–99)
Glucose-Capillary: 251 mg/dL — ABNORMAL HIGH (ref 70–99)
Glucose-Capillary: 89 mg/dL (ref 70–99)

## 2019-08-16 MED ORDER — INSULIN GLARGINE 100 UNIT/ML ~~LOC~~ SOLN: 20.0000 [IU] | Freq: Two times a day (BID) | SUBCUTANEOUS | 11 refills | Status: DC

## 2019-08-16 MED ORDER — SORBITOL 70 % SOLN: 30.0000 mL | Freq: Every day | 0 refills | Status: DC | PRN

## 2019-08-16 MED ORDER — SORBITOL 70 % SOLN: 30.0000 mL | Freq: Every day | Status: DC | PRN

## 2019-08-16 MED ORDER — SENNA 8.6 MG PO TABS: 1.0000 | ORAL_TABLET | Freq: Every day | ORAL | 0 refills | Status: DC

## 2019-08-16 MED ORDER — ENOXAPARIN SODIUM 30 MG/0.3ML ~~LOC~~ SOLN: 30.0000 mg | SUBCUTANEOUS | 0 refills | Status: DC

## 2019-08-16 NOTE — Progress Notes (Signed)
Physical Therapy Treatment Patient Details Name: Martin Richardson MRN: [id number] DOB: 10-23-1925 Today's Date: 08/16/2019    History of Present Illness Patient is a 86 y/o male who presents with right ankle fx s/p fall. Now s/p ORIF right ankle 7/17. PMH includes stroke, HTN, DM, glaucoma, CKD, left eye blindness.    PT Comments    Patient progressing well towards PT goals. Improved gait distance with Min guard assist for balance/safety and chair follow due to fatigue. Able to maintain NWB RLE with mobility; provided cues to place RLE into knee flexion during ambulation.  Continues to require min-Mod A to stand from varying surfaces however better able to assist today. Highly motivated to get to CIR. Reports nerve block has worn off so feeling more pain today but still able to tolerate session well. Continue to recommend CIR as pt is a great candidate. Will follow.   Follow Up Recommendations  CIR;Supervision for mobility/OOB     Equipment Recommendations  Rolling walker with 5" wheels    Recommendations for Other Services       Precautions / Restrictions Precautions Precautions: Fall Precaution Comments: pt with neuropathy and vertigo Restrictions Weight Bearing Restrictions: Yes RLE Weight Bearing: Non weight bearing    Mobility  Bed Mobility Overal bed mobility: Modified Independent             General bed mobility comments: HOB elevated and use of rail.  Transfers Overall transfer level: Needs assistance Equipment used: Rolling walker (2 wheeled) Transfers: Sit to/from Stand Sit to Stand: Min assist;Mod assist;From elevated surface         General transfer comment: Min A to stand from elevated bed height and Mod A of 2 from low recliner with cues for anterior translation. Cues for hand placement. Compliant with NWb.  Ambulation/Gait Ambulation/Gait assistance: Min assist;+2 safety/equipment Gait Distance (Feet): 40 Feet Assistive device: Rolling walker (2  wheeled)   Gait velocity: decreased   General Gait Details: "hop to" gait pattern with use of RW for support; able to maintain NWB RLE. Fatigues. Chair follow for safety.   Stairs             Wheelchair Mobility    Modified Rankin (Stroke Patients Only)       Balance Overall balance assessment: Needs assistance Sitting-balance support: Feet supported;No upper extremity supported Sitting balance-[name]: Good Sitting balance - Comments: assist to donn shoe   Standing balance support: During functional activity Standing balance-[name]: Poor Standing balance comment: Requires UE support and external support at times.                            Cognition Arousal/Alertness: Awake/alert Behavior During Therapy: WFL for tasks assessed/performed Overall Cognitive Status: Impaired/Different from baseline Area of Impairment: Memory                     Memory: Decreased short-term memory         General Comments: Aware of CIR coordinator coming by yesterday and that he is being worked up for [employer]      Exercises General Exercises - Lower Extremity Long Arc Quad: Right;Strengthening;15 reps;Seated Straight Leg Raises: Right;10 reps;Supine;Strengthening Hip Flexion/Marching: Strengthening;Right;15 reps;Seated Other Exercises Other Exercises: chair push ups x10 with WB through LLE    General Comments        Pertinent Vitals/Pain Pain Assessment: 0-10 Pain Score: 5  Pain Location: RLE Pain Descriptors / Indicators: Sore;Aching Pain  Intervention(s): Monitored during session;Repositioned;Premedicated before session    Home Living                      Prior Function            PT Goals (current goals can now be found in the care plan section) Progress towards PT goals: Progressing toward goals    Frequency    Min 3X/week      PT Plan Current plan remains appropriate    Co-evaluation              AM-PAC PT  "6 Clicks" Mobility   Outcome Measure  Help needed turning from your back to your side while in a flat bed without using bedrails?: None Help needed moving from lying on your back to sitting on the side of a flat bed without using bedrails?: A Little Help needed moving to and from a bed to a chair (including a wheelchair)?: A Lot Help needed standing up from a chair using your arms (e.g., wheelchair or bedside chair)?: A Lot Help needed to walk in hospital room?: A Little Help needed climbing 3-5 steps with a railing? : Total 6 Click Score: 15    End of Session Equipment Utilized During Treatment: Gait belt Activity Tolerance: Patient tolerated treatment well Patient left: in chair;with call bell/phone within reach;with chair alarm set Nurse Communication: Mobility status PT Visit Diagnosis: Unsteadiness on feet (R26.81);Difficulty in walking, not elsewhere classified (R26.2);Pain Pain - Right/Left: Right Pain - part of body: Leg     Time: 1135-1203 PT Time Calculation (min) (ACUTE ONLY): 28 min  Charges:  $Gait Training: 8-22 mins $Therapeutic Activity: 8-22 mins                     Marisa Severin, PT, DPT Acute Rehabilitation Services Pager [phone] Office [place] 08/16/2019, 12:54 PM

## 2019-08-16 NOTE — Progress Notes (Signed)
Inpatient Rehab Admissions Coordinator:   Awaiting determination from insurance regarding prior auth request for CIR.   Shann Medal, PT, DPT Admissions Coordinator [phone] 08/16/19  2:55 PM

## 2019-08-16 NOTE — Discharge Summary (Signed)
Physician Discharge Summary  Martin Richardson OVZ:[id number] DOB: 11/27/1925 DOA: 08/12/2019  PCP: [facility]  Admit date: 08/12/2019 Discharge date: 08/16/2019  Admitted From: Home  Disposition: CIR vs SNF  Recommendations for Outpatient Follow-up:  1. Follow up with PCP in 1-2 weeks 2. Please obtain BMP/CBC in one week 3. Monitor renal function.  4. Follow up with Dr Martin Richardson in 2 weeks post op.     Discharge Condition: Stable.  CODE STATUS; Full code Diet recommendation: Heart Healthy  Brief/Interim Summary: 86 year old with past medical history significant for diabetes type 2, CKD stage IV, anemia, hypertension, history of stroke who had a fall at home when he was walking to the bathroom.  Patient presented after mechanical fall he was found to have ankle fracture.  Patient underwent open reduction and internal fixation of right ankle by Dr. Percell Richardson on 7/17. Patient tolerated procedure well. He is currently awaiting insurance approval for CIR>   1-Right ankle fracture: Status post open reduction and internal fixation of right ankle fracture. PT per Ortho, recommending CIR>  Bowel regimen ordered. Had BM  Pain medications available. Incentive spirometry Discussed with Dr Martin Richardson, plan for lovenox for couples of weeks for DVT prophylaxis. PT, recommend CIR, awaiting CIR evaluation.   2-Hypertension: SBP soft, hold norvasc.   3-Diabetes type 2 with hypoglycemia. CBG increasing, continue with lantus 20 units BID>  SSI.   4-CKD stage IV: Creatinine stable at 1.9 Reviewed record from PCP in [place], prior cr range 2..  Renal function at baseline.   5-Constipation: Resume home dose MiraLAX, continue with Senokot Had BM 6-Anemia: Could be from renal disease.  Monitor hemoglobin.   Discharge Diagnoses:  Principal Problem:   Ankle fracture Active Problems:   CKD (chronic kidney disease), stage IV (HCC)   DM (diabetes mellitus), type 2 with  renal complications ([place])   History of stroke   Essential hypertension   Closed right ankle fracture    Discharge Instructions   Allergies as of 08/16/2019      Reactions   Adhesive [tape] Rash      Medication List    STOP taking these medications   amLODipine 5 MG tablet Commonly known as: NORVASC   aspirin 325 MG EC tablet   glimepiride 4 MG tablet Commonly known as: AMARYL     TAKE these medications   acetaminophen 325 MG tablet Commonly known as: TYLENOL Take 650 mg by mouth every 4 (four) hours as needed for mild pain or headache.   atorvastatin 80 MG tablet Commonly known as: LIPITOR Take 80 mg by mouth daily.   betamethasone valerate 0.1 % cream Commonly known as: VALISONE Apply 1 application topically 3 (three) times a week. psoriasis   docusate sodium 100 MG capsule Commonly known as: COLACE Take 100 mg by mouth 2 (two) times daily.   enoxaparin 30 MG/0.3ML injection Commonly known as: LOVENOX Inject 0.3 mLs (30 mg total) into the skin daily.   famotidine 20 MG tablet Commonly known as: PEPCID Take 20 mg by mouth at bedtime.   insulin glargine 100 UNIT/ML injection Commonly known as: LANTUS Inject 0.2 mLs (20 Units total) into the skin 2 (two) times daily. What changed: how much to take   levothyroxine 125 MCG tablet Commonly known as: SYNTHROID Take 125 mcg by mouth daily before breakfast.   polyethylene glycol 17 g packet Commonly known as: MIRALAX / GLYCOLAX Take 17 g by mouth 2 (two) times daily.   senna 8.6 MG  Tabs tablet Commonly known as: SENOKOT Take 1 tablet (8.6 mg total) by mouth daily. Start taking on: August 17, 2019   sorbitol 70 % Soln Take 30 mLs by mouth daily as needed for moderate constipation.   timolol 0.5 % ophthalmic solution Commonly known as: BETIMOL Place 1 drop into both eyes 2 (two) times daily.   Vitamin D2 50 MCG (2000 UT) Tabs Take 2,000 Units by mouth daily.       Allergies  Allergen Reactions   . Adhesive [Tape] Rash    Consultations:  Dr Martin Richardson    Procedures/Studies: DG Ankle Complete Right  Result Date: 08/12/2019 CLINICAL DATA:  86 year old male status post fall with pain. EXAM: RIGHT ANKLE - COMPLETE 3+ VIEW COMPARISON:  None. FINDINGS: Oblique mildly comminuted fracture through the distal right fibula metadiaphysis. Mild posterior and lateral displacement of the fibula. Widening of the right lateral mortise joint space, as well as the tibia fibular syndesmosis. Superimposed more linear fracture through the medial malleolus, nondisplaced. The posterior malleolus appears to remain intact. Talar dome intact. Calcaneus and other visible bones of the right foot appear intact. IMPRESSION: 1. Mildly displaced and comminuted fracture of the distal right fibula metadiaphysis with suspicion of injury to the tibiofibular syndesmosis. 2. Nondisplaced fracture through the medial malleolus. Electronically Signed   By: Genevie Ann M.D.   On: 08/12/2019 03:53   DG CHEST PORT 1 VIEW  Result Date: 08/12/2019 CLINICAL DATA:  Preop ankle surgery EXAM: PORTABLE CHEST 1 VIEW COMPARISON:  None. FINDINGS: Mild left basilar scarring/atelectasis. Lungs are otherwise clear. No pleural effusion or pneumothorax. Heart is normal in size. Surgical clips along the left neck. IMPRESSION: No evidence of acute cardiopulmonary disease. Electronically Signed   By: Julian Hy M.D.   On: 08/12/2019 07:37     Subjective: He is feeling well, no BM today. He had BM 2 days ago.  Pain controlled. He is looking forward going to CIR to start rehabilitation.  Discharge Exam: Vitals:   08/16/19 0440 08/16/19 0817  BP: 132/63 123/87  Pulse: (!) 57 75  Resp: 17 16  Temp: (!) 97.3 F (36.3 C) 99.4 F (37.4 C)  SpO2: 99% 98%     General: Pt is alert, awake, not in acute distress Cardiovascular: RRR, S1/S2 +, no rubs, no gallops Respiratory: CTA bilaterally, no wheezing, no rhonchi Abdominal: Soft, NT, ND,  bowel sounds + Extremities: no edema, no cyanosis    The results of significant diagnostics from this hospitalization (including imaging, microbiology, ancillary and laboratory) are listed below for reference.     Microbiology: Recent Results (from the past 240 hour(s))  SARS Coronavirus 2 by RT PCR (hospital order, performed in [facility] hospital lab) Nasopharyngeal Nasopharyngeal Swab     Status: None   Collection Time: 08/12/19  5:40 AM   Specimen: Nasopharyngeal Swab  Result Value Ref Range Status   SARS Coronavirus 2 NEGATIVE NEGATIVE Final    Comment: (NOTE) SARS-CoV-2 target nucleic acids are NOT DETECTED.  The SARS-CoV-2 RNA is generally detectable in upper and lower respiratory specimens during the acute phase of infection. The lowest concentration of SARS-CoV-2 viral copies this assay can detect is 250 copies / mL. A negative result does not preclude SARS-CoV-2 infection and should not be used as the sole basis for treatment or other patient management decisions.  A negative result may occur with improper specimen collection / handling, submission of specimen other than nasopharyngeal swab, presence of viral mutation(s) within the areas targeted by  this assay, and inadequate number of viral copies (<250 copies / mL). A negative result must be combined with clinical observations, patient history, and epidemiological information.  Fact Sheet for Patients:   StrictlyIdeas.no  Fact Sheet for Healthcare Providers: BankingDealers.co.za  This test is not yet approved or  cleared by the [place] FDA and has been authorized for detection and/or diagnosis of SARS-CoV-2 by FDA under an Emergency Use Authorization (EUA).  This EUA will remain in effect (meaning this test can be used) for the duration of the COVID-19 declaration under Section 564(b)(1) of the Act, 21 U.S.C. section 360bbb-3(b)(1), unless the authorization is  terminated or revoked sooner.  Performed at [facility], [place] [address] [place], [place] [address]   MRSA PCR Screening     Status: None   Collection Time: 08/13/19  5:50 AM   Specimen: Nasal Mucosa; Nasopharyngeal  Result Value Ref Range Status   MRSA by PCR NEGATIVE NEGATIVE Final    Comment:        The GeneXpert MRSA Assay (FDA approved for NASAL specimens only), is one component of a comprehensive MRSA colonization surveillance program. It is not intended to diagnose MRSA infection nor to guide or monitor treatment for MRSA infections. Performed at [facility], [place] [address], [place], [place] [address]      Labs: BNP (last 3 results) No results for input(s): BNP in the last 8760 hours. Basic Metabolic Panel: Recent Labs  Lab 08/12/19 0540 08/13/19 1204 08/14/19 0809 08/15/19 0716  NA 137 135 137 137  K 4.7 4.9 5.1 4.7  CL 107 105 105 107  CO2 22 23 23 25   GLUCOSE 200* 233* 160* 91  BUN 43* 32* 41* 48*  CREATININE 1.95* 1.94* 2.10* 2.12*  CALCIUM 9.3 9.1 9.7 9.2   Liver Function Tests: No results for input(s): AST, ALT, ALKPHOS, BILITOT, PROT, ALBUMIN in the last 168 hours. No results for input(s): LIPASE, AMYLASE in the last 168 hours. No results for input(s): AMMONIA in the last 168 hours. CBC: Recent Labs  Lab 08/12/19 0540 08/13/19 1204 08/14/19 0809 08/15/19 0716  WBC 11.2* 8.1 9.3 5.5  HGB 12.5* 12.1* 11.6* 10.8*  HCT 35.6* 34.5* 33.1* 31.2*  MCV 91.0 90.6 89.0 89.1  PLT 189 173 183 170   Cardiac Enzymes: No results for input(s): CKTOTAL, CKMB, CKMBINDEX, TROPONINI in the last 168 hours. BNP: Invalid input(s): POCBNP CBG: Recent Labs  Lab 08/15/19 1548 08/15/19 2027 08/15/19 2352 08/16/19 0436 08/16/19 0806  GLUCAP 217* 213* 161* 125* 89   D-Dimer No results for input(s): DDIMER in the last 72 hours. Hgb A1c No results for input(s): HGBA1C in the last 72 hours. Lipid Profile No results for input(s):  CHOL, HDL, LDLCALC, TRIG, CHOLHDL, LDLDIRECT in the last 72 hours. Thyroid function studies No results for input(s): TSH, T4TOTAL, T3FREE, THYROIDAB in the last 72 hours.  Invalid input(s): FREET3 Anemia work up No results for input(s): VITAMINB12, FOLATE, FERRITIN, TIBC, IRON, RETICCTPCT in the last 72 hours. Urinalysis    Component Value Date/Time   COLORURINE YELLOW 08/14/2019 2032   APPEARANCEUR CLEAR 08/14/2019 2032   LABSPEC 1.017 08/14/2019 2032   PHURINE 5.0 08/14/2019 2032   GLUCOSEU >=500 (A) 08/14/2019 2032   HGBUR NEGATIVE 08/14/2019 2032   BILIRUBINUR NEGATIVE 08/14/2019 2032   Grandfather NEGATIVE 08/14/2019 2032   PROTEINUR 30 (A) 08/14/2019 2032   NITRITE NEGATIVE 08/14/2019 2032   LEUKOCYTESUR NEGATIVE 08/14/2019 2032   Sepsis Labs Invalid input(s): PROCALCITONIN,  WBC,  LACTICIDVEN Microbiology Recent Results (from the past 240 hour(s))  SARS Coronavirus 2 by RT PCR (hospital order, performed in [facility] hospital lab) Nasopharyngeal Nasopharyngeal Swab     Status: None   Collection Time: 08/12/19  5:40 AM   Specimen: Nasopharyngeal Swab  Result Value Ref Range Status   SARS Coronavirus 2 NEGATIVE NEGATIVE Final    Comment: (NOTE) SARS-CoV-2 target nucleic acids are NOT DETECTED.  The SARS-CoV-2 RNA is generally detectable in upper and lower respiratory specimens during the acute phase of infection. The lowest concentration of SARS-CoV-2 viral copies this assay can detect is 250 copies / mL. A negative result does not preclude SARS-CoV-2 infection and should not be used as the sole basis for treatment or other patient management decisions.  A negative result may occur with improper specimen collection / handling, submission of specimen other than nasopharyngeal swab, presence of viral mutation(s) within the areas targeted by this assay, and inadequate number of viral copies (<250 copies / mL). A negative result must be combined with clinical observations,  patient history, and epidemiological information.  Fact Sheet for Patients:   StrictlyIdeas.no  Fact Sheet for Healthcare Providers: BankingDealers.co.za  This test is not yet approved or  cleared by the [place] FDA and has been authorized for detection and/or diagnosis of SARS-CoV-2 by FDA under an Emergency Use Authorization (EUA).  This EUA will remain in effect (meaning this test can be used) for the duration of the COVID-19 declaration under Section 564(b)(1) of the Act, 21 U.S.C. section 360bbb-3(b)(1), unless the authorization is terminated or revoked sooner.  Performed at [facility], [place] [address], [place], [place] [address]   MRSA PCR Screening     Status: None   Collection Time: 08/13/19  5:50 AM   Specimen: Nasal Mucosa; Nasopharyngeal  Result Value Ref Range Status   MRSA by PCR NEGATIVE NEGATIVE Final    Comment:        The GeneXpert MRSA Assay (FDA approved for NASAL specimens only), is one component of a comprehensive MRSA colonization surveillance program. It is not intended to diagnose MRSA infection nor to guide or monitor treatment for MRSA infections. Performed at [facility], [place] [address] [place], [place] [address]      Time coordinating discharge: 40 minutes  SIGNED:   Elmarie Shiley, MD  [facility]

## 2019-08-16 NOTE — Plan of Care (Signed)

## 2019-08-16 NOTE — Plan of Care (Signed)
  Problem: Activity: Goal: Risk for activity intolerance will decrease Outcome: Progressing   Problem: Pain Managment: Goal: General experience of comfort will improve Outcome: Progressing   Problem: Safety: Goal: Ability to remain free from injury will improve Outcome: Progressing   

## 2019-08-16 NOTE — TOC CAGE-AID Note (Signed)
Transition of Care [facility]) - CAGE-AID Screening   Patient Details  Name: Martin Richardson MRN: [id number] Date of Birth: 03/09/1925  Transition of Care [facility]) CM/SW Contact:    Emeterio Reeve, [place] Phone Number: 08/16/2019, 12:44 PM   Clinical Narrative:  CSW met with pt at bedside. CSW introduced self and explained her role at the hospital.  Pt denied alocohl use and substance use. No other resources are needed.   CAGE-AID Screening:    Have You Ever Felt You Ought to Cut Down on Your Drinking or Drug Use?: No Have People Annoyed You By Critizing Your Drinking Or Drug Use?: No Have You Felt Bad Or Guilty About Your Drinking Or Drug Use?: No Have You Ever Had a Drink or Used Drugs First Thing In The Morning to Steady Your Nerves or to Get Rid of a Hangover?: No CAGE-AID Score: 0  Substance Abuse Education Offered: Yes      Blima Ledger, [place] Social Worker [phone]

## 2019-08-17 DIAGNOSIS — Z8673 Personal history of transient ischemic attack (TIA), and cerebral infarction without residual deficits: Secondary | ICD-10-CM

## 2019-08-17 DIAGNOSIS — N184 Chronic kidney disease, stage 4 (severe): Secondary | ICD-10-CM

## 2019-08-17 LAB — CBC WITH DIFFERENTIAL/PLATELET
Abs Immature Granulocytes: 0.11 10*3/uL — ABNORMAL HIGH (ref 0.00–0.07)
Basophils Absolute: 0 10*3/uL (ref 0.0–0.1)
Basophils Relative: 1 %
Eosinophils Absolute: 0.9 10*3/uL — ABNORMAL HIGH (ref 0.0–0.5)
Eosinophils Relative: 12 %
HCT: 34.5 % — ABNORMAL LOW (ref 39.0–52.0)
Hemoglobin: 12 g/dL — ABNORMAL LOW (ref 13.0–17.0)
Immature Granulocytes: 2 %
Lymphocytes Relative: 26 %
Lymphs Abs: 1.9 10*3/uL (ref 0.7–4.0)
MCH: 30.8 pg (ref 26.0–34.0)
MCHC: 34.8 g/dL (ref 30.0–36.0)
MCV: 88.7 fL (ref 80.0–100.0)
Monocytes Absolute: 1 10*3/uL (ref 0.1–1.0)
Monocytes Relative: 14 %
Neutro Abs: 3.4 10*3/uL (ref 1.7–7.7)
Neutrophils Relative %: 45 %
Platelets: 230 10*3/uL (ref 150–400)
RBC: 3.89 MIL/uL — ABNORMAL LOW (ref 4.22–5.81)
RDW: 13.8 % (ref 11.5–15.5)
WBC: 7.4 10*3/uL (ref 4.0–10.5)
nRBC: 0 % (ref 0.0–0.2)

## 2019-08-17 LAB — COMPREHENSIVE METABOLIC PANEL
ALT: 9 U/L (ref 0–44)
AST: 15 U/L (ref 15–41)
Albumin: 3.2 g/dL — ABNORMAL LOW (ref 3.5–5.0)
Alkaline Phosphatase: 54 U/L (ref 38–126)
Anion gap: 7 (ref 5–15)
BUN: 52 mg/dL — ABNORMAL HIGH (ref 8–23)
CO2: 25 mmol/L (ref 22–32)
Calcium: 9.7 mg/dL (ref 8.9–10.3)
Chloride: 104 mmol/L (ref 98–111)
Creatinine, Ser: 2.01 mg/dL — ABNORMAL HIGH (ref 0.61–1.24)
GFR calc Af Amer: 32 mL/min — ABNORMAL LOW (ref >60)
GFR calc non Af Amer: 28 mL/min — ABNORMAL LOW (ref >60)
Glucose, Bld: 145 mg/dL — ABNORMAL HIGH (ref 70–99)
Potassium: 4.5 mmol/L (ref 3.5–5.1)
Sodium: 136 mmol/L (ref 135–145)
Total Bilirubin: 1.1 mg/dL (ref 0.3–1.2)
Total Protein: 6.6 g/dL (ref 6.5–8.1)

## 2019-08-17 LAB — GLUCOSE, CAPILLARY
Glucose-Capillary: 174 mg/dL — ABNORMAL HIGH (ref 70–99)
Glucose-Capillary: 130 mg/dL — ABNORMAL HIGH (ref 70–99)
Glucose-Capillary: 255 mg/dL — ABNORMAL HIGH (ref 70–99)
Glucose-Capillary: 253 mg/dL — ABNORMAL HIGH (ref 70–99)
Glucose-Capillary: 252 mg/dL — ABNORMAL HIGH (ref 70–99)

## 2019-08-17 LAB — MAGNESIUM: Magnesium: 2.1 mg/dL (ref 1.7–2.4)

## 2019-08-17 LAB — PHOSPHORUS: Phosphorus: 3.1 mg/dL (ref 2.5–4.6)

## 2019-08-17 NOTE — TOC Initial Note (Addendum)
Transition of Care [facility]) - Initial/Assessment Note    Patient Details  Name: Martin Richardson MRN: [id number] Date of Birth: 04-Jan-1926  Transition of Care [facility]) CM/SW Contact:    Sharin Mons, RN Phone Number: [phone] 08/17/2019, 9:07 AM  Clinical Narrative:   Pt s/p ORIF right ankle 7/17. PMH includes stroke, HTN, DM, glaucoma, CKD, left eye blindness.  NCM received consult: Peer to Peer for CIR denied. Needs SNF.              NCM spoke with patient and daughter Martin Richardson [phone]) regarding peer to peer denial for CIR and the need for SNF placement. Patient reported that he lives alone and is currently unable to care for self  given his current physical needs and fall risk. Patient expressed understanding of the need for SNF placement and is agreeable. Patient without preference for SNF. NCM discussed insurance authorization process and provided Medicare SNF ratings list. Patient expressed being hopeful for rehab and to feel better soon. No further questions reported at this time. NCM to continue to follow and assist with discharge planning needs.  7/22  Accepted SNF beds shared with pt and daughter Martin Richardson [phone]). [facility] selected and bed offer extended to pt. Pt accepted. INSURANCE AUTH. pending   Expected Discharge Plan: [place] Barriers to Discharge: Insurance Authorization, No SNF bed   Patient Goals and CMS Choice Patient states their goals for this hospitalization and ongoing recovery are:: To get better CMS Medicare.gov Compare Post Acute Care list provided to:: Patient    Expected Discharge Plan and Services Expected Discharge Plan: [place]                                              Prior Living Arrangements/Services                       Activities of Daily Living Home Assistive Devices/Equipment: Eyeglasses, Kasandra Knudsen (specify quad or straight), CBG Meter (single point cane) ADL  Screening (condition at time of admission) Patient's cognitive ability adequate to safely complete daily activities?: Yes Is the patient deaf or have difficulty hearing?: No Does the patient have difficulty seeing, even when wearing glasses/contacts?: No Does the patient have difficulty concentrating, remembering, or making decisions?: No Patient able to express need for assistance with ADLs?: Yes Does the patient have difficulty dressing or bathing?: Yes Independently performs ADLs?: No (Has problems with vertigo at times) Communication: Independent Dressing (OT): Needs assistance Is this a change from baseline?: Change from baseline, expected to last >3 days Grooming: Needs assistance Is this a change from baseline?: Change from baseline, expected to last >3 days Feeding: Needs assistance Is this a change from baseline?: Change from baseline, expected to last >3 days Bathing: Needs assistance Is this a change from baseline?: Change from baseline, expected to last >3 days Toileting: Dependent Is this a change from baseline?: Change from baseline, expected to last >3days In/Out Bed: Dependent Is this a change from baseline?: Change from baseline, expected to last >3 days Walks in Home: Dependent Is this a change from baseline?: Change from baseline, expected to last >3 days Does the patient have difficulty walking or climbing stairs?: Yes Weakness of Legs: Right Weakness of Arms/Hands: None  Permission Sought/Granted  Emotional Assessment              Admission diagnosis:  Preop examination [Z01.818] Ankle fracture [S82.899A] Closed right ankle fracture [S82.891A] Closed fracture of right ankle, initial encounter [S82.891A] Patient Active Problem List   Diagnosis Date Noted  . Ankle fracture 08/12/2019  . CKD (chronic kidney disease), stage IV ([place]) 08/12/2019  . DM (diabetes mellitus), type 2 with renal complications ([place]) 74/16/3845  . History of  stroke 08/12/2019  . Essential hypertension 08/12/2019  . Closed right ankle fracture 08/12/2019   PCP:  [place], [place]:   [place], [place] [address] Phone: [phone] Fax: [phone]     Social Determinants of Health (SDOH) Interventions    Readmission Risk Interventions No flowsheet data found.

## 2019-08-17 NOTE — Progress Notes (Signed)
Physical Therapy Treatment Patient Details Name: Martin Richardson MRN: [id number] DOB: [birth year] Today's Date: 08/17/2019    History of Present Illness Patient is a 86 y/o male who presents with right ankle fx s/p fall. Now s/p ORIF right ankle 7/17. PMH includes stroke, HTN, DM, glaucoma, CKD, left eye blindness.    PT Comments    Session focused on seated therapeutic exercises for strengthening and transfer/gait training. Pt continuing to require moderate assist for transfers, ambulating 25 ft then 2 ft with a walker and chair follow. Displays decreased endurance and strength. Recommend post acute rehab at discharge to maximize functional independence.    Follow Up Recommendations  SNF     Equipment Recommendations  Rolling walker with 5" wheels;Wheelchair (measurements PT);Wheelchair cushion (measurements PT)    Recommendations for Other Services       Precautions / Restrictions Precautions Precautions: Fall Restrictions Weight Bearing Restrictions: Yes RLE Weight Bearing: Non weight bearing    Mobility  Bed Mobility               General bed mobility comments: OOB in chair  Transfers Overall transfer level: Needs assistance Equipment used: Rolling walker (2 wheeled) Transfers: Sit to/from Stand Sit to Stand: Mod assist         General transfer comment: ModA to rise from chair x 2, cues for scooting to edge of chair, placing RLE anteriorly, transition of hands to walker  Ambulation/Gait Ambulation/Gait assistance: Min assist;+2 safety/equipment Gait Distance (Feet): 27 Feet (25", 2") Assistive device: Rolling walker (2 wheeled) Gait Pattern/deviations: Step-to pattern Gait velocity: decreased   General Gait Details: Hop to pattern, decreased foot clearance, minA for stability. Chair follow utilized   Marine scientist Rankin (Stroke Patients Only)       Balance Overall balance assessment: Needs  assistance   Sitting balance-[name]: Good       Standing balance-[name]: Poor Standing balance comment: Requires UE support and external support at times.                            Cognition Arousal/Alertness: Awake/alert Behavior During Therapy: WFL for tasks assessed/performed Overall Cognitive Status: Within Functional Limits for tasks assessed                                        Exercises General Exercises - Lower Extremity Quad Sets: Both;10 reps;Seated Long Arc Quad: Both;10 reps;Seated Hip Flexion/Marching: Both;10 reps;Seated    General Comments        Pertinent Vitals/Pain Pain Assessment: No/denies pain    Home Living                      Prior Function            PT Goals (current goals can now be found in the care plan section) Acute Rehab PT Goals Patient Stated Goal: to get to rehab and return to PLOF Potential to Achieve Goals: Good Progress towards PT goals: Progressing toward goals    Frequency    Min 3X/week      PT Plan Discharge plan needs to be updated    Co-evaluation              AM-PAC PT "6 Clicks" Mobility  Outcome Measure  Help needed turning from your back to your side while in a flat bed without using bedrails?: None Help needed moving from lying on your back to sitting on the side of a flat bed without using bedrails?: A Little Help needed moving to and from a bed to a chair (including a wheelchair)?: A Little Help needed standing up from a chair using your arms (e.g., wheelchair or bedside chair)?: A Lot Help needed to walk in hospital room?: A Little Help needed climbing 3-5 steps with a railing? : Total 6 Click Score: 16    End of Session Equipment Utilized During Treatment: Gait belt Activity Tolerance: Patient tolerated treatment well Patient left: in chair;with call bell/phone within reach;with family/visitor present Nurse Communication: Mobility status PT  Visit Diagnosis: Unsteadiness on feet (R26.81);Difficulty in walking, not elsewhere classified (R26.2);Pain Pain - Right/Left: Right Pain - part of body: Leg     Time: 1518-3437 PT Time Calculation (min) (ACUTE ONLY): 17 min  Charges:  $Gait Training: 8-22 mins                       Wyona Almas, PT, DPT Acute Rehabilitation Services Pager [phone] Office [phone]    Martin Richardson 08/17/2019, 4:30 PM

## 2019-08-17 NOTE — Progress Notes (Signed)
Occupational Therapy Treatment Patient Details Name: Martin Richardson MRN: [id number] DOB: 07/05/1925 Today's Date: 08/17/2019    History of present illness Patient is a 86 y/o male who presents with right ankle fx s/p fall. Now s/p ORIF right ankle 7/17. PMH includes stroke, HTN, DM, glaucoma, CKD, left eye blindness.   OT comments  Focus of session on ADL training seated on 3 in 1 at sink. Pt requiring min assist for thoroughness, leaning side to side for pericare and to don briefs. Updated d/c to SNF as pt is not a candidate for CIR per insurance.   Follow Up Recommendations  SNF;Supervision/Assistance - 24 hour    Equipment Recommendations  Wheelchair (measurements OT);Wheelchair cushion (measurements OT)    Recommendations for Other Services      Precautions / Restrictions Precautions Precautions: Fall Precaution Comments: pt with neuropathy and vertigo Restrictions Weight Bearing Restrictions: Yes RLE Weight Bearing: Non weight bearing       Mobility Bed Mobility Overal bed mobility: Modified Independent             General bed mobility comments: increased time  Transfers Overall transfer level: Needs assistance Equipment used: Rolling walker (2 wheeled) Transfers: Sit to/from Stand Sit to Stand: Min assist;Mod assist;From elevated surface         General transfer comment: mod to rise from bed, min from 3 in 1    Balance Overall balance assessment: Needs assistance   Sitting balance-[name]: Good       Standing balance-[name]: Poor Standing balance comment: Requires UE support and external support at times.                           ADL either performed or assessed with clinical judgement   ADL Overall ADL's : Needs assistance/impaired     Grooming: Wash/dry hands;Wash/dry face;Oral care;Brushing hair;Sitting;Set up   Upper Body Bathing: Set up;Sitting   Lower Body Bathing: Minimal assistance;Sit to/from stand   Upper  Body Dressing : Set up;Sitting   Lower Body Dressing: Minimal assistance;Sitting/lateral leans   Toilet Transfer: Minimal assistance;Ambulation;RW   Toileting- Clothing Manipulation and Hygiene: Minimal assistance;Sit to/from stand;Sitting/lateral lean Toileting - Clothing Manipulation Details (indicate cue type and reason): assisted for thoroughness     Functional mobility during ADLs: Minimal assistance;Rolling walker General ADL Comments: performed ADL at sink seated on 3 in 1     Vision       Perception     Praxis      Cognition Arousal/Alertness: Awake/alert Behavior During Therapy: WFL for tasks assessed/performed Overall Cognitive Status: Within Functional Limits for tasks assessed                                          Exercises     Shoulder Instructions       General Comments      Pertinent Vitals/ Pain       Pain Assessment: No/denies pain  Home Living                                          Prior Functioning/Environment              Frequency  Min 2X/week        Progress Toward  Goals  OT Goals(current goals can now be found in the care plan section)  Progress towards OT goals: Progressing toward goals  Acute Rehab OT Goals Patient Stated Goal: to get to rehab and return to PLOF OT Goal Formulation: With patient Time For Goal Achievement: 08/29/19 Potential to Achieve Goals: Good  Plan Discharge plan needs to be updated    Co-evaluation                 AM-PAC OT "6 Clicks" Daily Activity     Outcome Measure   Help from another person eating meals?: None Help from another person taking care of personal grooming?: None Help from another person toileting, which includes using toliet, bedpan, or urinal?: A Little Help from another person bathing (including washing, rinsing, drying)?: A Little Help from another person to put on and taking off regular upper body clothing?: None Help from  another person to put on and taking off regular lower body clothing?: A Little 6 Click Score: 21    End of Session Equipment Utilized During Treatment: Gait belt;Rolling walker  OT Visit Diagnosis: Unsteadiness on feet (R26.81);Other abnormalities of gait and mobility (R26.89);Muscle weakness (generalized) (M62.81);History of falling (Z91.81)   Activity Tolerance Patient tolerated treatment well   Patient Left in chair;with call bell/phone within reach;with chair alarm set   Nurse Communication          Time: 0981-1914 OT Time Calculation (min): 38 min  Charges: OT General Charges $OT Visit: 1 Visit OT Treatments $Self Care/Home Management : 38-52 mins  Martin Richardson, OTR/L Acute Rehabilitation Services Pager: [phone] Office: [phone]   Malka So 08/17/2019, 12:28 PM

## 2019-08-17 NOTE — Progress Notes (Signed)
Inpatient Rehab Admissions Coordinator:   Notified by insurance that request for CIR authorization has been denied, despite peer to peer review.  Pt and CM aware, and pt in agreement for SNF for short term rehab.  Will sign off for CIR at this time.   Shann Medal, PT, DPT Admissions Coordinator [phone] 08/17/19  10:39 AM

## 2019-08-17 NOTE — NC FL2 (Signed)
[place] LEVEL OF CARE SCREENING TOOL     IDENTIFICATION  Patient Name: Martin Richardson Birthdate: September 23, 1925 Sex: male Admission Date (Current Location): 08/12/2019  [facility] and [place] Number:  Herbalist and Address:  [facility], [place] [address], [place], [place] [address]      Provider Number: [id number]  Attending Physician Name and Address:  Allie Bossier, MD  Relative Name and Phone Number:       Current Level of Care: Hospital Recommended Level of Care: [place] Prior Approval Number:    Date Approved/Denied:   PASRR Number: [id number]  Discharge Plan: SNF    Current Diagnoses: Patient Active Problem List   Diagnosis Date Noted  . Ankle fracture 08/12/2019  . CKD (chronic kidney disease), stage IV ([place]) 08/12/2019  . DM (diabetes mellitus), type 2 with renal complications ([place]) 65/46/5035  . History of stroke 08/12/2019  . Essential hypertension 08/12/2019  . Closed right ankle fracture 08/12/2019    Orientation RESPIRATION BLADDER Height & Weight     Self, Time, Situation, Place  Normal Continent Weight: 100.7 kg Height:  5\' 10"  (177.8 cm)  BEHAVIORAL SYMPTOMS/MOOD NEUROLOGICAL BOWEL NUTRITION STATUS      Continent Diet (refer to d/c summary)  AMBULATORY STATUS COMMUNICATION OF NEEDS Skin   Extensive Assist Verbally Normal, Surgical wounds                       Personal Care Assistance Level of Assistance  Bathing, Dressing, Feeding Bathing Assistance: Limited assistance Feeding assistance: Independent Dressing Assistance: Limited assistance     Functional Limitations Info  Sight, Hearing, Speech Sight Info: Adequate Hearing Info: Impaired (L eye with blindness) Speech Info: Adequate    SPECIAL CARE FACTORS FREQUENCY  PT (By licensed PT), OT (By licensed OT)     PT Frequency: 5x/week, evaluate and treat OT Frequency: 5x/week, evaluate and treat             Contractures Contractures Info: Not present    Additional Factors Info  Code Status, Allergies Code Status Info: Full Code Allergies Info: Adhesive Tape           Current Medications (08/17/2019):  This is the current hospital active medication list Current Facility-Administered Medications  Medication Dose Route Frequency Provider Last Rate Last Admin  . acetaminophen (TYLENOL) tablet 325-650 mg  325-650 mg Oral Q6H PRN Renette Butters, MD   650 mg at 08/16/19 1116  . atorvastatin (LIPITOR) tablet 80 mg  80 mg Oral q1800 Renette Butters, MD   80 mg at 08/16/19 1714  . docusate sodium (COLACE) capsule 100 mg  100 mg Oral BID Renette Butters, MD   100 mg at 08/17/19 0945  . enoxaparin (LOVENOX) injection 30 mg  30 mg Subcutaneous Q24H Regalado, Belkys A, MD   30 mg at 08/16/19 1714  . famotidine (PEPCID) tablet 20 mg  20 mg Oral QHS Renette Butters, MD   20 mg at 08/16/19 2131  . feeding supplement (GLUCERNA SHAKE) (GLUCERNA SHAKE) liquid 237 mL  237 mL Oral BID BM Regalado, Belkys A, MD   237 mL at 08/16/19 1300  . hydrALAZINE (APRESOLINE) injection 10 mg  10 mg Intravenous Q4H PRN Renette Butters, MD      . HYDROcodone-acetaminophen (NORCO/VICODIN) 5-325 MG per tablet 1 tablet  1 tablet Oral Q6H PRN Renette Butters, MD   1 tablet at 08/17/19 0620  .  insulin aspart (novoLOG) injection 0-9 Units  0-9 Units Subcutaneous Q4H Renette Butters, MD   1 Units at 08/17/19 [phone]  . insulin glargine (LANTUS) injection 20 Units  20 Units Subcutaneous BID Regalado, Belkys A, MD   20 Units at 08/17/19 0946  . levothyroxine (SYNTHROID) tablet 125 mcg  125 mcg Oral QAC breakfast Renette Butters, MD   125 mcg at 08/17/19 [phone]  . metoCLOPramide (REGLAN) tablet 5 mg  5 mg Oral Q8H PRN Renette Butters, MD       Or  . metoCLOPramide (REGLAN) injection 5 mg  5 mg Intravenous Q8H PRN Edmonia Lynch D, MD      . morphine 2 MG/ML injection 0.5 mg  0.5 mg Intravenous Q2H PRN Renette Butters, MD   0.5 mg at 08/12/19 1530  . nutrition supplement (JUVEN) (JUVEN) powder packet 1 packet  1 packet Oral BID BM Renette Butters, MD   1 packet at 08/17/19 0945  . ondansetron (ZOFRAN) tablet 4 mg  4 mg Oral Q6H PRN Renette Butters, MD       Or  . ondansetron [facility]) injection 4 mg  4 mg Intravenous Q6H PRN Renette Butters, MD      . polyethylene glycol (MIRALAX / GLYCOLAX) packet 17 g  17 g Oral BID Renette Butters, MD   17 g at 08/17/19 0945  . senna (SENOKOT) tablet 8.6 mg  1 tablet Oral Daily Renette Butters, MD   8.6 mg at 08/17/19 0946  . sorbitol 70 % solution 30 mL  30 mL Oral Daily PRN Regalado, Belkys A, MD      . timolol (TIMOPTIC) 0.5 % ophthalmic solution 1 drop  1 drop Both Eyes BID Renette Butters, MD   1 drop at 08/17/19 6283     Discharge Medications: Please see discharge summary for a list of discharge medications.  Relevant Imaging Results:  Relevant Lab Results:   Additional Information SS# [id number]  Sharin Mons, RN

## 2019-08-17 NOTE — Progress Notes (Signed)
PROGRESS NOTE    Martin Richardson  QJJ:[id number] DOB: 03-01-1925 DOA: 08/12/2019 PCP: [name]     Brief Narrative:  86 y.o. WM PMHx CVA, DM type II uncontrolled with complication, CKD stage IV, Anemia, HTN,   Had a fall at home when he was walking to the bathroom.  Patient states he tripped on a rug and fell onto the floor.  Did not hit his head or lose consciousness.  Denies any chest pain or shortness of breath.  Patient just recently moved from [place].  ED Course: In the ER x-rays revealed right ankle fracture.  On-call orthopedic surgeon Dr. Griffin Basil was consulted who requested transfer to [facility] for likely surgical management.  Labs are remarkable for creatinine of 1.9 blood glucose 200 hemoglobin 12.5 WBC 9.2   Subjective: Afebrile last 24 hours A/O x4, negative CP, negative S OB, negative N/V.  Positive RIGHT lower extremity pain has been working with PT and ambulated to bathroom and back.   Assessment & Plan: Covid vaccination; positive vaccination   Principal Problem:   Ankle fracture Active Problems:   CKD (chronic kidney disease), stage IV ([facility])   DM (diabetes mellitus), type 2 with renal complications ([place])   History of stroke   Essential hypertension   Closed right ankle fracture   1. Right ankle fracture status post mechanical fall -orthopedic surgeon Dr. Griffin Basil has been consulted requested transfer to [facility] for surgical management and in anticipation of which patient has been placed n.p.o.  Pain relief medications. 2. History of diabetes mellitus type 2 -patient states he takes Lantus insulin 40 units twice daily.  For now I have kept patient on sliding scale coverage and after surgery restart home medication once patient started eating. 3. Hypertension we will need to check home medications.  For now as needed IV hydralazine since patient is n.p.o. 4. Chronic kidney disease stage IV as per the patient we do not have any old  labs to compare.  Patient states he does follow-up with nephrologist and was told that he may need dialysis soon.  Follow metabolic panel.  5. History of stroke -patient states he does not take any antiplatelet agents.  No residual deficits as per the patient. 6. Anemia likely could be from renal disease.  No old labs to compare follow CBC.   Goals of care -7/21 spoke with NCM Whit[facility] Hero patient has been denied CIR.  We will begin work-up for SNF   DVT prophylaxis: Lovenox Code Status: Full Family Communication:  Status is: Inpatient    Dispo: The patient is from: Home              Anticipated d/c is to: SNF              Anticipated d/c date is: 8/5              Patient currently unstable      Consultants:  Orthopedic surgery  Procedures/Significant Events:  7/17 RIGHT ankle OPEN REDUCTION INTERNAL FIXATION (ORIF) ANKLE FRACTURE   I have personally reviewed and interpreted all radiology studies and my findings are as above.  VENTILATOR SETTINGS:    Cultures   Antimicrobials:    Devices    LINES / TUBES:      Continuous Infusions:   Objective: Vitals:   08/16/19 0817 08/16/19 1931 08/17/19 0302 08/17/19 0814  BP: 123/87 (!) 146/69 (!) 142/68 133/60  Pulse: 75 73 68 65  Resp: 16 17  16 17  Temp: 99.4 F (37.4 C) 98 F (36.7 C) 98.1 F (36.7 C) 98.3 F (36.8 C)  TempSrc: Oral Oral Oral Oral  SpO2: 98% 98% 96% 93%  Weight:      Height:        Intake/Output Summary (Last 24 hours) at 08/17/2019 0855 Last data filed at 08/17/2019 0300 Gross per 24 hour  Intake 730 ml  Output 200 ml  Net 530 ml   Filed Weights   08/12/19 0328  Weight: 100.7 kg    Examination:  General: A/O x4, No acute respiratory distress Eyes: negative scleral hemorrhage, negative anisocoria, negative icterus ENT: Negative Runny nose, negative gingival bleeding, Neck:  Negative scars, masses, torticollis, lymphadenopathy, JVD Lungs: Clear to auscultation bilaterally  without wheezes or crackles Cardiovascular: Regular rate and rhythm without murmur gallop or rub normal S1 and S2 Abdomen: negative abdominal pain, nondistended, positive soft, bowel sounds, no rebound, no ascites, no appreciable mass Extremities: RIGHT lower extremity in cast, able to wiggle toes, warm to touch. Skin: Negative rashes, lesions, ulcers Psychiatric:  Negative depression, negative anxiety, negative fatigue, negative mania  Central nervous system:  Cranial nerves II through XII intact, tongue/uvula midline, all extremities muscle strength 5/5, sensation intact throughout, negative dysarthria, negative expressive aphasia, negative receptive aphasia.  .     Data Reviewed: Care during the described time interval was provided by me .  I have reviewed this patient's available data, including medical history, events of note, physical examination, and all test results as part of my evaluation.  CBC: Recent Labs  Lab 08/12/19 0540 08/13/19 1204 08/14/19 0809 08/15/19 0716  WBC 11.2* 8.1 9.3 5.5  HGB 12.5* 12.1* 11.6* 10.8*  HCT 35.6* 34.5* 33.1* 31.2*  MCV 91.0 90.6 89.0 89.1  PLT 189 173 183 644   Basic Metabolic Panel: Recent Labs  Lab 08/12/19 0540 08/13/19 1204 08/14/19 0809 08/15/19 0716  NA 137 135 137 137  K 4.7 4.9 5.1 4.7  CL 107 105 105 107  CO2 22 23 23 25   GLUCOSE 200* 233* 160* 91  BUN 43* 32* 41* 48*  CREATININE 1.95* 1.94* 2.10* 2.12*  CALCIUM 9.3 9.1 9.7 9.2   GFR: Estimated Creatinine Clearance: 26.4 mL/min (A) (by C-G formula based on SCr of 2.12 mg/dL (H)). Liver Function Tests: No results for input(s): AST, ALT, ALKPHOS, BILITOT, PROT, ALBUMIN in the last 168 hours. No results for input(s): LIPASE, AMYLASE in the last 168 hours. No results for input(s): AMMONIA in the last 168 hours. Coagulation Profile: No results for input(s): INR, PROTIME in the last 168 hours. Cardiac Enzymes: No results for input(s): CKTOTAL, CKMB, CKMBINDEX, TROPONINI  in the last 168 hours. BNP (last 3 results) No results for input(s): PROBNP in the last 8760 hours. HbA1C: No results for input(s): HGBA1C in the last 72 hours. CBG: Recent Labs  Lab 08/16/19 1217 08/16/19 1619 08/16/19 1931 08/17/19 0310 08/17/19 0815  GLUCAP 175* 246* 251* 174* 130*   Lipid Profile: No results for input(s): CHOL, HDL, LDLCALC, TRIG, CHOLHDL, LDLDIRECT in the last 72 hours. Thyroid Function Tests: No results for input(s): TSH, T4TOTAL, FREET4, T3FREE, THYROIDAB in the last 72 hours. Anemia Panel: No results for input(s): VITAMINB12, FOLATE, FERRITIN, TIBC, IRON, RETICCTPCT in the last 72 hours. Sepsis Labs: No results for input(s): PROCALCITON, LATICACIDVEN in the last 168 hours.  Recent Results (from the past 240 hour(s))  SARS Coronavirus 2 by RT PCR (hospital order, performed in [facility] hospital lab) Nasopharyngeal Nasopharyngeal Swab  Status: None   Collection Time: 08/12/19  5:40 AM   Specimen: Nasopharyngeal Swab  Result Value Ref Range Status   SARS Coronavirus 2 NEGATIVE NEGATIVE Final    Comment: (NOTE) SARS-CoV-2 target nucleic acids are NOT DETECTED.  The SARS-CoV-2 RNA is generally detectable in upper and lower respiratory specimens during the acute phase of infection. The lowest concentration of SARS-CoV-2 viral copies this assay can detect is 250 copies / mL. A negative result does not preclude SARS-CoV-2 infection and should not be used as the sole basis for treatment or other patient management decisions.  A negative result may occur with improper specimen collection / handling, submission of specimen other than nasopharyngeal swab, presence of viral mutation(s) within the areas targeted by this assay, and inadequate number of viral copies (<250 copies / mL). A negative result must be combined with clinical observations, patient history, and epidemiological information.  Fact Sheet for Patients:     StrictlyIdeas.no  Fact Sheet for Healthcare Providers: BankingDealers.co.za  This test is not yet approved or  cleared by the [place] FDA and has been authorized for detection and/or diagnosis of SARS-CoV-2 by FDA under an Emergency Use Authorization (EUA).  This EUA will remain in effect (meaning this test can be used) for the duration of the COVID-19 declaration under Section 564(b)(1) of the Act, 21 U.S.C. section 360bbb-3(b)(1), unless the authorization is terminated or revoked sooner.  Performed at [facility], [place] [address], [place], [place] [address]   MRSA PCR Screening     Status: None   Collection Time: 08/13/19  5:50 AM   Specimen: Nasal Mucosa; Nasopharyngeal  Result Value Ref Range Status   MRSA by PCR NEGATIVE NEGATIVE Final    Comment:        The GeneXpert MRSA Assay (FDA approved for NASAL specimens only), is one component of a comprehensive MRSA colonization surveillance program. It is not intended to diagnose MRSA infection nor to guide or monitor treatment for MRSA infections. Performed at [facility], [place] [address], [place], [place] [address]          Radiology Studies: No results found.      Scheduled Meds: . atorvastatin  80 mg Oral q1800  . docusate sodium  100 mg Oral BID  . enoxaparin (LOVENOX) injection  30 mg Subcutaneous Q24H  . famotidine  20 mg Oral QHS  . feeding supplement (GLUCERNA SHAKE)  237 mL Oral BID BM  . insulin aspart  0-9 Units Subcutaneous Q4H  . insulin glargine  20 Units Subcutaneous BID  . levothyroxine  125 mcg Oral QAC breakfast  . nutrition supplement (JUVEN)  1 packet Oral BID BM  . polyethylene glycol  17 g Oral BID  . senna  1 tablet Oral Daily  . timolol  1 drop Both Eyes BID   Continuous Infusions:   LOS: 5 days    Time spent:40 min    Shakara Tweedy, Geraldo Docker, MD [facility] Pager [phone]  If 7PM-7AM,  please contact night-coverage [email or web address] Password [facility] 08/17/2019, 8:55 AM

## 2019-08-17 NOTE — Plan of Care (Signed)

## 2019-08-17 NOTE — Progress Notes (Addendum)
Nutrition Follow-up  DOCUMENTATION CODES:   Obesity unspecified  INTERVENTION:   -D/c Glucerna -D/c Juven -Magic cup BID with meals, each supplement provides 290 kcal and 9 grams of protein  NUTRITION DIAGNOSIS:   Increased nutrient needs related to post-op healing as evidenced by estimated needs.  Ongoing  GOAL:   Patient will meet greater than or equal to 90% of their needs  Progressing   MONITOR:   Labs, I & O's, Supplement acceptance, PO intake, Weight trends  REASON FOR ASSESSMENT:   Consult Assessment of nutrition requirement/status  ASSESSMENT:   86 year old male admitted for surgical management of right ankle fracture after mechanical fall at home with past medical history of DM2, CKD stage IV, anemia, HTN, and history of stroke.  7/17- s/p ORIF of rt ankle  Reviewed I/O's: +530 ml x 24 hours and -2.3 L since admission  UOP: 200 ml x 24 hours  Pt sleeping soundly at time of visit and did not respond to voice.   Pt with great appetite, consuming 100% of meals. Noted all of his breakfast tray was consumed.   Per TOC notes, pt with awaiting SNF placement.   Labs reviewed: CBGS: 89-251 (inpatient orders for glycemic control are 0-9 units insulin aspart every 4 hours and 20 units insulin glargine daily).   NUTRITION - FOCUSED PHYSICAL EXAM:    Most Recent Value  Orbital Region No depletion  Upper Arm Region No depletion  Thoracic and Lumbar Region No depletion  Buccal Region No depletion  Temple Region No depletion  Clavicle Bone Region No depletion  Clavicle and Acromion Bone Region No depletion  Scapular Bone Region No depletion  Dorsal Hand No depletion  Patellar Region No depletion  Anterior Thigh Region No depletion  Posterior Calf Region No depletion  Edema (RD Assessment) Mild  Hair Reviewed  Eyes Reviewed  Mouth Reviewed  Skin Reviewed  Nails Reviewed       Diet Order:   Diet Order            Diet renal/carb modified with fluid  restriction Diet-HS Snack? Nothing; Room service appropriate? Yes; Fluid consistency: Thin  Diet effective now                 EDUCATION NEEDS:   No education needs have been identified at this time  Skin:  Skin Assessment: Skin Integrity Issues: Skin Integrity Issues:: Incisions Incisions: Closed; R leg  Last BM:  7/16  Height:   Ht Readings from Last 1 Encounters:  08/12/19 5\' 10"  (1.778 m)    Weight:   Wt Readings from Last 1 Encounters:  08/12/19 100.7 kg    Ideal Body Weight:  75.5 kg  BMI:  Body mass index is 31.85 kg/m.  Estimated Nutritional Needs:   Kcal:  2000-2200  Protein:  90-100  Fluid:  >/= 2 L    Loistine Chance, RD, LDN, [place] Registered Dietitian II Certified Diabetes Care and Education Specialist Please refer to [facility] for RD and/or RD on-call/weekend/after hours pager

## 2019-08-17 NOTE — Plan of Care (Signed)
  Problem: Health Behavior/Discharge Planning: Goal: Ability to manage health-related needs will improve Outcome: Progressing   Problem: Activity: Goal: Risk for activity intolerance will decrease Outcome: Progressing   Problem: Elimination: Goal: Will not experience complications related to urinary retention Outcome: Completed/Met

## 2019-08-18 DIAGNOSIS — E0841 Diabetes mellitus due to underlying condition with diabetic mononeuropathy: Secondary | ICD-10-CM

## 2019-08-18 DIAGNOSIS — E1140 Type 2 diabetes mellitus with diabetic neuropathy, unspecified: Secondary | ICD-10-CM | POA: Diagnosis present

## 2019-08-18 LAB — CBC WITH DIFFERENTIAL/PLATELET
Abs Immature Granulocytes: 0.09 10*3/uL — ABNORMAL HIGH (ref 0.00–0.07)
Basophils Absolute: 0 10*3/uL (ref 0.0–0.1)
Basophils Relative: 1 %
Eosinophils Absolute: 0.8 10*3/uL — ABNORMAL HIGH (ref 0.0–0.5)
Eosinophils Relative: 12 %
HCT: 30.8 % — ABNORMAL LOW (ref 39.0–52.0)
Hemoglobin: 10.8 g/dL — ABNORMAL LOW (ref 13.0–17.0)
Immature Granulocytes: 1 %
Lymphocytes Relative: 27 %
Lymphs Abs: 1.8 10*3/uL (ref 0.7–4.0)
MCH: 30.9 pg (ref 26.0–34.0)
MCHC: 35.1 g/dL (ref 30.0–36.0)
MCV: 88.3 fL (ref 80.0–100.0)
Monocytes Absolute: 1.1 10*3/uL — ABNORMAL HIGH (ref 0.1–1.0)
Monocytes Relative: 16 %
Neutro Abs: 2.9 10*3/uL (ref 1.7–7.7)
Neutrophils Relative %: 43 %
Platelets: 219 10*3/uL (ref 150–400)
RBC: 3.49 MIL/uL — ABNORMAL LOW (ref 4.22–5.81)
RDW: 13.9 % (ref 11.5–15.5)
WBC: 6.6 10*3/uL (ref 4.0–10.5)
nRBC: 0 % (ref 0.0–0.2)

## 2019-08-18 LAB — GLUCOSE, CAPILLARY
Glucose-Capillary: 145 mg/dL — ABNORMAL HIGH (ref 70–99)
Glucose-Capillary: 192 mg/dL — ABNORMAL HIGH (ref 70–99)
Glucose-Capillary: 200 mg/dL — ABNORMAL HIGH (ref 70–99)
Glucose-Capillary: 205 mg/dL — ABNORMAL HIGH (ref 70–99)
Glucose-Capillary: 226 mg/dL — ABNORMAL HIGH (ref 70–99)
Glucose-Capillary: 228 mg/dL — ABNORMAL HIGH (ref 70–99)
Glucose-Capillary: 271 mg/dL — ABNORMAL HIGH (ref 70–99)

## 2019-08-18 LAB — COMPREHENSIVE METABOLIC PANEL
ALT: 11 U/L (ref 0–44)
AST: 18 U/L (ref 15–41)
Albumin: 3 g/dL — ABNORMAL LOW (ref 3.5–5.0)
Alkaline Phosphatase: 51 U/L (ref 38–126)
Anion gap: 7 (ref 5–15)
BUN: 60 mg/dL — ABNORMAL HIGH (ref 8–23)
CO2: 22 mmol/L (ref 22–32)
Calcium: 9.2 mg/dL (ref 8.9–10.3)
Chloride: 104 mmol/L (ref 98–111)
Creatinine, Ser: 2.09 mg/dL — ABNORMAL HIGH (ref 0.61–1.24)
GFR calc Af Amer: 31 mL/min — ABNORMAL LOW (ref >60)
GFR calc non Af Amer: 27 mL/min — ABNORMAL LOW (ref >60)
Glucose, Bld: 229 mg/dL — ABNORMAL HIGH (ref 70–99)
Potassium: 4.4 mmol/L (ref 3.5–5.1)
Sodium: 133 mmol/L — ABNORMAL LOW (ref 135–145)
Total Bilirubin: 0.8 mg/dL (ref 0.3–1.2)
Total Protein: 5.7 g/dL — ABNORMAL LOW (ref 6.5–8.1)

## 2019-08-18 LAB — HEMOGLOBIN A1C
Hgb A1c MFr Bld: 7.7 % — ABNORMAL HIGH (ref 4.8–5.6)
Mean Plasma Glucose: 174.29 mg/dL

## 2019-08-18 LAB — MAGNESIUM: Magnesium: 2.1 mg/dL (ref 1.7–2.4)

## 2019-08-18 LAB — PHOSPHORUS: Phosphorus: 2.8 mg/dL (ref 2.5–4.6)

## 2019-08-18 LAB — LIPID PANEL
Cholesterol: 70 mg/dL (ref 0–200)
HDL: 24 mg/dL — ABNORMAL LOW (ref >40)
LDL Cholesterol: 17 mg/dL (ref 0–99)
Total CHOL/HDL Ratio: 2.9 RATIO
Triglycerides: 145 mg/dL (ref <150)
VLDL: 29 mg/dL (ref 0–40)

## 2019-08-18 MED ORDER — INSULIN ASPART 100 UNIT/ML ~~LOC~~ SOLN
8.0000 [IU] | Freq: Three times a day (TID) | SUBCUTANEOUS | Status: DC
  Administered 2019-08-19 (×2): 8 [IU] via SUBCUTANEOUS

## 2019-08-18 MED ORDER — GABAPENTIN 100 MG PO CAPS
100.0000 mg | ORAL_CAPSULE | Freq: Every day | ORAL | Status: DC
  Administered 2019-08-18: 100 mg via ORAL
  Filled 2019-08-18: qty 1

## 2019-08-18 NOTE — Progress Notes (Signed)
PROGRESS NOTE    Martin Richardson  KWI:[id number] DOB: 1925/07/01 DOA: 08/12/2019 PCP: [name]     Brief Narrative:  86 y.o. WM PMHx CVA, DM type II uncontrolled with complication, CKD stage IV, Anemia, HTN,   Had a fall at home when he was walking to the bathroom.  Patient states he tripped on a rug and fell onto the floor.  Did not hit his head or lose consciousness.  Denies any chest pain or shortness of breath.  Patient just recently moved from [place].  ED Course: In the ER x-rays revealed right ankle fracture.  On-call orthopedic surgeon Dr. Griffin Basil was consulted who requested transfer to [facility] for likely surgical management.  Labs are remarkable for creatinine of 1.9 blood glucose 200 hemoglobin 12.5 WBC 9.2   Subjective: 7/22 A/O x4, negative CP, negative S OB, negative N/V. Positive RIGHT lower extremity pain (appropriate)   Assessment & Plan: Covid vaccination; positive vaccination   Principal Problem:   Ankle fracture Active Problems:   CKD (chronic kidney disease), stage IV (HCC)   DM (diabetes mellitus), type 2 with renal complications ([place])   History of stroke   Essential hypertension   Closed right ankle fracture   DM neuropathy, painful ([place])  RIGHT ankle fracture -S/p mechanical fall -Dr. Griffin Basil requested transfer to [facility] for definitive management -7/17 s/p ORIF  DM type II uncontrolled with complication -2/42 hemoglobin A1c= 7.7 -Lantus 20 units BID -7/22 NovoLog 8 units qac -Sensitive SSI  DM neuropathy -7/22 gabapentin 100 mg qhs  Essential HTN -Hydralazine PRN  CKD stage IV (baseline Cr 1.94) -Hold all nephrotoxic medication -Monitor closely Recent Labs    08/17/19 0910 08/18/19 0139  CREATININE 2.01* 2.09*    Hx CVA -Does not take antiplatelet medication -Negative residual deficits -7/22 PT consult; work with patient daily ROM, strengthening     Goals of care -7/21 spoke with [facility] patient has been denied CIR.  We will begin work-up for SNF   DVT prophylaxis: Lovenox Code Status: Full Family Communication:  Status is: Inpatient    Dispo: The patient is from: Home              Anticipated d/c is to: SNF              Anticipated d/c date is: 8/5              Patient currently unstable      Consultants:  Orthopedic surgery  Procedures/Significant Events:  7/17 RIGHT ankle OPEN REDUCTION INTERNAL FIXATION (ORIF) ANKLE FRACTURE   I have personally reviewed and interpreted all radiology studies and my findings are as above.  VENTILATOR SETTINGS:    Cultures   Antimicrobials:    Devices    LINES / TUBES:      Continuous Infusions:   Objective: Vitals:   08/17/19 1916 08/18/19 0344 08/18/19 0728 08/18/19 1514  BP: 127/62 122/63 132/66 (!) 144/62  Pulse: 70 65 63 72  Resp: 18 18 18 18   Temp: 97.9 F (36.6 C) 98.2 F (36.8 C) 97.8 F (36.6 C) 98 F (36.7 C)  TempSrc: Oral Oral  Oral  SpO2: 97% 94% 95% 98%  Weight:      Height:        Intake/Output Summary (Last 24 hours) at 08/18/2019 2011 Last data filed at 08/18/2019 1712 Gross per 24 hour  Intake --  Output 450 ml  Net -450 ml   Filed  Weights   08/12/19 0328  Weight: 100.7 kg    Examination:  General: A/O x4, No acute respiratory distress Eyes: negative scleral hemorrhage, negative anisocoria, negative icterus ENT: Negative Runny nose, negative gingival bleeding, Neck:  Negative scars, masses, torticollis, lymphadenopathy, JVD Lungs: Clear to auscultation bilaterally without wheezes or crackles Cardiovascular: Regular rate and rhythm without murmur gallop or rub normal S1 and S2 Abdomen: negative abdominal pain, nondistended, positive soft, bowel sounds, no rebound, no ascites, no appreciable mass Extremities: RIGHT lower extremity in cast, able to wiggle toes, warm to touch. Skin: Negative rashes, lesions, ulcers Psychiatric:  Negative depression, negative  anxiety, negative fatigue, negative mania  Central nervous system:  Cranial nerves II through XII intact, tongue/uvula midline, all extremities muscle strength 5/5, sensation intact throughout, negative dysarthria, negative expressive aphasia, negative receptive aphasia.  .     Data Reviewed: Care during the described time interval was provided by me .  I have reviewed this patient's available data, including medical history, events of note, physical examination, and all test results as part of my evaluation.  CBC: Recent Labs  Lab 08/13/19 1204 08/14/19 0809 08/15/19 0716 08/17/19 0910 08/18/19 0139  WBC 8.1 9.3 5.5 7.4 6.6  NEUTROABS  --   --   --  3.4 2.9  HGB 12.1* 11.6* 10.8* 12.0* 10.8*  HCT 34.5* 33.1* 31.2* 34.5* 30.8*  MCV 90.6 89.0 89.1 88.7 88.3  PLT 173 183 170 230 542   Basic Metabolic Panel: Recent Labs  Lab 08/13/19 1204 08/14/19 0809 08/15/19 0716 08/17/19 0910 08/18/19 0139  NA 135 137 137 136 133*  K 4.9 5.1 4.7 4.5 4.4  CL 105 105 107 104 104  CO2 23 23 25 25 22   GLUCOSE 233* 160* 91 145* 229*  BUN 32* 41* 48* 52* 60*  CREATININE 1.94* 2.10* 2.12* 2.01* 2.09*  CALCIUM 9.1 9.7 9.2 9.7 9.2  MG  --   --   --  2.1 2.1  PHOS  --   --   --  3.1 2.8   GFR: Estimated Creatinine Clearance: 26.8 mL/min (A) (by C-G formula based on SCr of 2.09 mg/dL (H)). Liver Function Tests: Recent Labs  Lab 08/17/19 0910 08/18/19 0139  AST 15 18  ALT 9 11  ALKPHOS 54 51  BILITOT 1.1 0.8  PROT 6.6 5.7*  ALBUMIN 3.2* 3.0*   No results for input(s): LIPASE, AMYLASE in the last 168 hours. No results for input(s): AMMONIA in the last 168 hours. Coagulation Profile: No results for input(s): INR, PROTIME in the last 168 hours. Cardiac Enzymes: No results for input(s): CKTOTAL, CKMB, CKMBINDEX, TROPONINI in the last 168 hours. BNP (last 3 results) No results for input(s): PROBNP in the last 8760 hours. HbA1C: Recent Labs    08/18/19 0139  HGBA1C 7.7*    CBG: Recent Labs  Lab 08/18/19 0344 08/18/19 0418 08/18/19 0730 08/18/19 1218 08/18/19 1639  GLUCAP 200* 192* 145* 205* 228*   Lipid Profile: Recent Labs    08/18/19 0139  CHOL 70  HDL 24*  LDLCALC 17  TRIG 145  CHOLHDL 2.9   Thyroid Function Tests: No results for input(s): TSH, T4TOTAL, FREET4, T3FREE, THYROIDAB in the last 72 hours. Anemia Panel: No results for input(s): VITAMINB12, FOLATE, FERRITIN, TIBC, IRON, RETICCTPCT in the last 72 hours. Sepsis Labs: No results for input(s): PROCALCITON, LATICACIDVEN in the last 168 hours.  Recent Results (from the past 240 hour(s))  SARS Coronavirus 2 by RT PCR (hospital order, performed in [facility] hospital lab) Nasopharyngeal Nasopharyngeal Swab     Status: None   Collection Time: 08/12/19  5:40 AM   Specimen: Nasopharyngeal Swab  Result Value Ref Range Status   SARS Coronavirus 2 NEGATIVE NEGATIVE Final    Comment: (NOTE) SARS-CoV-2 target nucleic acids are NOT DETECTED.  The SARS-CoV-2 RNA is generally detectable in upper and lower respiratory specimens during the acute phase of infection. The lowest concentration of SARS-CoV-2 viral copies this assay can detect is 250 copies / mL. A negative result does not preclude SARS-CoV-2 infection and should not be used as the sole basis for treatment or other patient management decisions.  A negative result may occur with improper specimen collection / handling, submission of specimen other than nasopharyngeal swab, presence of viral mutation(s) within the areas targeted by this assay, and inadequate number of viral copies (<250 copies / mL). A negative result must be combined with clinical observations, patient history, and epidemiological information.  Fact Sheet for Patients:   StrictlyIdeas.no  Fact Sheet for Healthcare Providers: BankingDealers.co.za  This test is not yet approved or  cleared by the [place] FDA  and has been authorized for detection and/or diagnosis of SARS-CoV-2 by FDA under an Emergency Use Authorization (EUA).  This EUA will remain in effect (meaning this test can be used) for the duration of the COVID-19 declaration under Section 564(b)(1) of the Act, 21 U.S.C. section 360bbb-3(b)(1), unless the authorization is terminated or revoked sooner.  Performed at [facility], [place] [address] [place], [place] [address]   MRSA PCR Screening     Status: None   Collection Time: 08/13/19  5:50 AM   Specimen: Nasal Mucosa; Nasopharyngeal  Result Value Ref Range Status   MRSA by PCR NEGATIVE NEGATIVE Final    Comment:        The GeneXpert MRSA Assay (FDA approved for NASAL specimens only), is one component of a comprehensive MRSA colonization surveillance program. It is not intended to diagnose MRSA infection nor to guide or monitor treatment for MRSA infections. Performed at [facility], [place] [address], [place],  [address]          Radiology Studies: No results found.      Scheduled Meds: . atorvastatin  80 mg Oral q1800  . docusate sodium  100 mg Oral BID  . enoxaparin (LOVENOX) injection  30 mg Subcutaneous Q24H  . famotidine  20 mg Oral QHS  . insulin aspart  0-9 Units Subcutaneous Q4H  . [START ON 08/19/2019] insulin aspart  8 Units Subcutaneous TID WC  . insulin glargine  20 Units Subcutaneous BID  . levothyroxine  125 mcg Oral QAC breakfast  . polyethylene glycol  17 g Oral BID  . senna  1 tablet Oral Daily  . timolol  1 drop Both Eyes BID   Continuous Infusions:   LOS: 6 days    Time spent:40 min    Daequan Kozma, Geraldo Docker, MD [facility] Pager [phone]  If 7PM-7AM, please contact night-coverage [email or web address] Password [facility] 08/18/2019, 8:11 PM

## 2019-08-18 NOTE — Progress Notes (Addendum)
Inpatient Diabetes Program Recommendations  AACE/ADA: New Consensus Statement on Inpatient Glycemic Control (2015)  Target Ranges:  Prepandial:   less than 140 mg/dL      Peak postprandial:   less than 180 mg/dL (1-2 hours)      Critically ill patients:  140 - 180 mg/dL   Lab Results  Component Value Date   GLUCAP 205 (H) 08/18/2019   HGBA1C 7.7 (H) 08/18/2019    Review of Glycemic Control Results for ACETON, KINNEAR (MRN [id number]) as of 08/18/2019 13:49  Ref. Range 08/18/2019 03:44 08/18/2019 04:18 08/18/2019 07:30 08/18/2019 12:18  Glucose-Capillary Latest Ref Range: 70 - 99 mg/dL 200 (H) 192 (H) 145 (H) 205 (H)   Diabetes history: DM 2 Outpatient Diabetes medications:  Lantus 40 units bid Amaryl 4 mg bid Current orders for Inpatient glycemic control:  Novolog sensitive q 4 hours Lantus 20 units bid Inpatient Diabetes Program Recommendations:    Please consider reducing frequency of Novolog correction to tid with meals and HS.  Also consider adding Novolog 3 units tid with  Meals (hold if patient eats less than 50%).   Thanks,  Adah Perl, RN, BC-ADM Inpatient Diabetes Coordinator Pager [phone] (8a-5p)

## 2019-08-18 NOTE — Plan of Care (Signed)
  Problem: Health Behavior/Discharge Planning: Goal: Ability to manage health-related needs will improve Outcome: Progressing   Problem: Activity: Goal: Risk for activity intolerance will decrease Outcome: Progressing   Problem: Pain Managment: Goal: General experience of comfort will improve Outcome: Progressing   

## 2019-08-19 LAB — GLUCOSE, CAPILLARY
Glucose-Capillary: 118 mg/dL — ABNORMAL HIGH (ref 70–99)
Glucose-Capillary: 177 mg/dL — ABNORMAL HIGH (ref 70–99)
Glucose-Capillary: 190 mg/dL — ABNORMAL HIGH (ref 70–99)
Glucose-Capillary: 198 mg/dL — ABNORMAL HIGH (ref 70–99)
Glucose-Capillary: 207 mg/dL — ABNORMAL HIGH (ref 70–99)
Glucose-Capillary: 254 mg/dL — ABNORMAL HIGH (ref 70–99)

## 2019-08-19 LAB — COMPREHENSIVE METABOLIC PANEL
ALT: 14 U/L (ref 0–44)
AST: 15 U/L (ref 15–41)
Albumin: 2.8 g/dL — ABNORMAL LOW (ref 3.5–5.0)
Alkaline Phosphatase: 46 U/L (ref 38–126)
Anion gap: 8 (ref 5–15)
BUN: 60 mg/dL — ABNORMAL HIGH (ref 8–23)
CO2: 22 mmol/L (ref 22–32)
Calcium: 9.2 mg/dL (ref 8.9–10.3)
Chloride: 106 mmol/L (ref 98–111)
Creatinine, Ser: 2.18 mg/dL — ABNORMAL HIGH (ref 0.61–1.24)
GFR calc Af Amer: 29 mL/min — ABNORMAL LOW (ref >60)
GFR calc non Af Amer: 25 mL/min — ABNORMAL LOW (ref >60)
Glucose, Bld: 228 mg/dL — ABNORMAL HIGH (ref 70–99)
Potassium: 4.3 mmol/L (ref 3.5–5.1)
Sodium: 136 mmol/L (ref 135–145)
Total Bilirubin: 0.2 mg/dL — ABNORMAL LOW (ref 0.3–1.2)
Total Protein: 5.4 g/dL — ABNORMAL LOW (ref 6.5–8.1)

## 2019-08-19 LAB — CBC WITH DIFFERENTIAL/PLATELET
Abs Immature Granulocytes: 0.1 10*3/uL — ABNORMAL HIGH (ref 0.00–0.07)
Basophils Absolute: 0 10*3/uL (ref 0.0–0.1)
Basophils Relative: 1 %
Eosinophils Absolute: 0.7 10*3/uL — ABNORMAL HIGH (ref 0.0–0.5)
Eosinophils Relative: 12 %
HCT: 29.4 % — ABNORMAL LOW (ref 39.0–52.0)
Hemoglobin: 10.2 g/dL — ABNORMAL LOW (ref 13.0–17.0)
Immature Granulocytes: 2 %
Lymphocytes Relative: 32 %
Lymphs Abs: 1.9 10*3/uL (ref 0.7–4.0)
MCH: 30.7 pg (ref 26.0–34.0)
MCHC: 34.7 g/dL (ref 30.0–36.0)
MCV: 88.6 fL (ref 80.0–100.0)
Monocytes Absolute: 1 10*3/uL (ref 0.1–1.0)
Monocytes Relative: 17 %
Neutro Abs: 2.1 10*3/uL (ref 1.7–7.7)
Neutrophils Relative %: 36 %
Platelets: 221 10*3/uL (ref 150–400)
RBC: 3.32 MIL/uL — ABNORMAL LOW (ref 4.22–5.81)
RDW: 13.8 % (ref 11.5–15.5)
WBC: 5.8 10*3/uL (ref 4.0–10.5)
nRBC: 0 % (ref 0.0–0.2)

## 2019-08-19 LAB — MAGNESIUM: Magnesium: 2.2 mg/dL (ref 1.7–2.4)

## 2019-08-19 LAB — PHOSPHORUS: Phosphorus: 3.1 mg/dL (ref 2.5–4.6)

## 2019-08-19 LAB — SARS CORONAVIRUS 2 (TAT 6-24 HRS): SARS Coronavirus 2: NEGATIVE

## 2019-08-19 MED ORDER — SODIUM CHLORIDE 0.9 % IV SOLN: INTRAVENOUS | Status: DC

## 2019-08-19 MED ORDER — INSULIN ASPART 100 UNIT/ML ~~LOC~~ SOLN
12.0000 [IU] | Freq: Three times a day (TID) | SUBCUTANEOUS | Status: DC
  Administered 2019-08-19 – 2019-08-20 (×4): 12 [IU] via SUBCUTANEOUS

## 2019-08-19 MED ORDER — GABAPENTIN 100 MG PO CAPS
200.0000 mg | ORAL_CAPSULE | Freq: Every day | ORAL | Status: DC
  Administered 2019-08-19 – 2019-08-21 (×3): 200 mg via ORAL
  Filled 2019-08-19 (×3): qty 2

## 2019-08-19 NOTE — Plan of Care (Signed)
  Problem: Elimination: Goal: Will not experience complications related to bowel motility Outcome: Progressing   Problem: Pain Managment: Goal: General experience of comfort will improve Outcome: Progressing   Problem: Safety: Goal: Ability to remain free from injury will improve Outcome: Progressing   Problem: Skin Integrity: Goal: Risk for impaired skin integrity will decrease Outcome: Progressing   Problem: Skin Integrity: Goal: Risk for impaired skin integrity will decrease Outcome: Progressing   Problem: Activity: Goal: Risk for activity intolerance will decrease Outcome: Progressing

## 2019-08-19 NOTE — TOC Progression Note (Signed)
Transition of Care [facility]) - Progression Note    Patient Details  Name: Martin Richardson MRN: [id number] Date of Birth: Nov 22, 1925  Transition of Care [facility]) CM/SW [place], [place] Phone Number: 08/19/2019, 11:55 AM  Clinical Narrative:    CSW started insurance auth for SNF placement at [facility].  Reference # [id number].  TOC Team will continue to assist for disposition planning.   Expected Discharge Plan: Skilled Nursing Facility Barriers to Discharge: Insurance Authorization, No SNF bed  Expected Discharge Plan and Services Expected Discharge Plan: [place]                                               Social Determinants of Health (SDOH) Interventions    Readmission Risk Interventions No flowsheet data found.

## 2019-08-19 NOTE — Progress Notes (Signed)
PROGRESS NOTE    Martin Richardson  HER:[id number] DOB: [birth year] DOA: 08/12/2019 PCP: [name]     Brief Narrative:  86 y.o. WM PMHx CVA, DM type II uncontrolled with complication, CKD stage IV, Anemia, HTN,   Had a fall at home when he was walking to the bathroom.  Patient states he tripped on a rug and fell onto the floor.  Did not hit his head or lose consciousness.  Denies any chest pain or shortness of breath.  Patient just recently moved from [place].  ED Course: In the ER x-rays revealed right ankle fracture.  On-call orthopedic surgeon Dr. Griffin Basil was consulted who requested transfer to [facility] for likely surgical management.  Labs are remarkable for creatinine of 1.9 blood glucose 200 hemoglobin 12.5 WBC 9.2   Subjective: 7/23 afebrile overnight.  A/O x4, negative CP, negative S OB, negative N/V  Assessment & Plan: Covid vaccination; positive vaccination   Principal Problem:   Ankle fracture Active Problems:   CKD (chronic kidney disease), stage IV (HCC)   DM (diabetes mellitus), type 2 with renal complications ([place])   History of stroke   Essential hypertension   Closed right ankle fracture   DM neuropathy, painful ([place])  RIGHT ankle fracture -S/p mechanical fall -Dr. Griffin Basil requested transfer to [facility] for definitive management -7/17 s/p ORIF  DM type II uncontrolled with complication -8/56 hemoglobin A1c= 7.7 -Lantus 20 units BID -7/23 increase NovoLog 12 units qac -Sensitive SSI  DM neuropathy -7/23 increase Gabapentin 200 mg qhs  Essential HTN -Hydralazine PRN  CKD stage IV (baseline Cr 1.94) -Hold all nephrotoxic medication -Monitor closely Recent Labs    08/17/19 0910 08/18/19 0139 08/19/19 0342  CREATININE 2.01* 2.09* 2.18*  -7/23 believe patient is becoming slightly dehydrated; normal saline 34ml/hr  Hx CVA -Does not take antiplatelet medication -Negative residual deficits -7/22 PT consult; work with  patient daily ROM, strengthening     Goals of care -7/21 spoke with [facility] patient has been denied CIR.  We will begin work-up for SNF   DVT prophylaxis: Lovenox Code Status: Full Family Communication: 7/23 1 daughter present at bedside/1 daughter on the phone discussed plan of care answered all questions Status is: Inpatient    Dispo: The patient is from: Home              Anticipated d/c is to: SNF              Anticipated d/c date is: 8/5              Patient currently unstable      Consultants:  Orthopedic surgery  Procedures/Significant Events:  7/17 RIGHT ankle OPEN REDUCTION INTERNAL FIXATION (ORIF) ANKLE FRACTURE   I have personally reviewed and interpreted all radiology studies and my findings are as above.  VENTILATOR SETTINGS:    Cultures   Antimicrobials:    Devices    LINES / TUBES:      Continuous Infusions:   Objective: Vitals:   08/18/19 0728 08/18/19 1514 08/19/19 0405 08/19/19 0826  BP: 132/66 (!) 144/62 (!) 138/65 (!) 142/57  Pulse: 63 72 62 69  Resp: 18 18 16    Temp: 97.8 F (36.6 C) 98 F (36.7 C) 98.3 F (36.8 C) 98.2 F (36.8 C)  TempSrc:  Oral Oral Oral  SpO2: 95% 98% 97% 100%  Weight:      Height:        Intake/Output Summary (Last 24  hours) at 08/19/2019 1512 Last data filed at 08/19/2019 0730 Gross per 24 hour  Intake --  Output 950 ml  Net -950 ml   Filed Weights   08/12/19 0328  Weight: 100.7 kg    Examination:  General: A/O x4, No acute respiratory distress Eyes: negative scleral hemorrhage, negative anisocoria, negative icterus ENT: Negative Runny nose, negative gingival bleeding, Neck:  Negative scars, masses, torticollis, lymphadenopathy, JVD Lungs: Clear to auscultation bilaterally without wheezes or crackles Cardiovascular: Regular rate and rhythm without murmur gallop or rub normal S1 and S2 Abdomen: negative abdominal pain, nondistended, positive soft, bowel sounds, no rebound, no  ascites, no appreciable mass Extremities: RIGHT lower extremity in cast, able to wiggle toes, warm to touch. Skin: Negative rashes, lesions, ulcers Psychiatric:  Negative depression, negative anxiety, negative fatigue, negative mania  Central nervous system:  Cranial nerves II through XII intact, tongue/uvula midline, all extremities muscle strength 5/5, sensation intact throughout, negative dysarthria, negative expressive aphasia, negative receptive aphasia.  .     Data Reviewed: Care during the described time interval was provided by me .  I have reviewed this patient's available data, including medical history, events of note, physical examination, and all test results as part of my evaluation.  CBC: Recent Labs  Lab 08/14/19 0809 08/15/19 0716 08/17/19 0910 08/18/19 0139 08/19/19 0342  WBC 9.3 5.5 7.4 6.6 5.8  NEUTROABS  --   --  3.4 2.9 2.1  HGB 11.6* 10.8* 12.0* 10.8* 10.2*  HCT 33.1* 31.2* 34.5* 30.8* 29.4*  MCV 89.0 89.1 88.7 88.3 88.6  PLT 183 170 230 219 884   Basic Metabolic Panel: Recent Labs  Lab 08/14/19 0809 08/15/19 0716 08/17/19 0910 08/18/19 0139 08/19/19 0342  NA 137 137 136 133* 136  K 5.1 4.7 4.5 4.4 4.3  CL 105 107 104 104 106  CO2 23 25 25 22 22   GLUCOSE 160* 91 145* 229* 228*  BUN 41* 48* 52* 60* 60*  CREATININE 2.10* 2.12* 2.01* 2.09* 2.18*  CALCIUM 9.7 9.2 9.7 9.2 9.2  MG  --   --  2.1 2.1 2.2  PHOS  --   --  3.1 2.8 3.1   GFR: Estimated Creatinine Clearance: 25.7 mL/min (A) (by C-G formula based on SCr of 2.18 mg/dL (H)). Liver Function Tests: Recent Labs  Lab 08/17/19 0910 08/18/19 0139 08/19/19 0342  AST 15 18 15   ALT 9 11 14   ALKPHOS 54 51 46  BILITOT 1.1 0.8 0.2*  PROT 6.6 5.7* 5.4*  ALBUMIN 3.2* 3.0* 2.8*   No results for input(s): LIPASE, AMYLASE in the last 168 hours. No results for input(s): AMMONIA in the last 168 hours. Coagulation Profile: No results for input(s): INR, PROTIME in the last 168 hours. Cardiac  Enzymes: No results for input(s): CKTOTAL, CKMB, CKMBINDEX, TROPONINI in the last 168 hours. BNP (last 3 results) No results for input(s): PROBNP in the last 8760 hours. HbA1C: Recent Labs    08/18/19 0139  HGBA1C 7.7*   CBG: Recent Labs  Lab 08/18/19 2051 08/19/19 0024 08/19/19 0429 08/19/19 0848 08/19/19 1200  GLUCAP 271* 254* 190* 207* 198*   Lipid Profile: Recent Labs    08/18/19 0139  CHOL 70  HDL 24*  LDLCALC 17  TRIG 145  CHOLHDL 2.9   Thyroid Function Tests: No results for input(s): TSH, T4TOTAL, FREET4, T3FREE, THYROIDAB in the last 72 hours. Anemia Panel: No results for input(s): VITAMINB12, FOLATE, FERRITIN, TIBC, IRON, RETICCTPCT in the last 72 hours. Sepsis Labs: No  results for input(s): PROCALCITON, LATICACIDVEN in the last 168 hours.  Recent Results (from the past 240 hour(s))  SARS Coronavirus 2 by RT PCR (hospital order, performed in [facility] hospital lab) Nasopharyngeal Nasopharyngeal Swab     Status: None   Collection Time: 08/12/19  5:40 AM   Specimen: Nasopharyngeal Swab  Result Value Ref Range Status   SARS Coronavirus 2 NEGATIVE NEGATIVE Final    Comment: (NOTE) SARS-CoV-2 target nucleic acids are NOT DETECTED.  The SARS-CoV-2 RNA is generally detectable in upper and lower respiratory specimens during the acute phase of infection. The lowest concentration of SARS-CoV-2 viral copies this assay can detect is 250 copies / mL. A negative result does not preclude SARS-CoV-2 infection and should not be used as the sole basis for treatment or other patient management decisions.  A negative result may occur with improper specimen collection / handling, submission of specimen other than nasopharyngeal swab, presence of viral mutation(s) within the areas targeted by this assay, and inadequate number of viral copies (<250 copies / mL). A negative result must be combined with clinical observations, patient history, and epidemiological  information.  Fact Sheet for Patients:   StrictlyIdeas.no  Fact Sheet for Healthcare Providers: BankingDealers.co.za  This test is not yet approved or  cleared by the [place] FDA and has been authorized for detection and/or diagnosis of SARS-CoV-2 by FDA under an Emergency Use Authorization (EUA).  This EUA will remain in effect (meaning this test can be used) for the duration of the COVID-19 declaration under Section 564(b)(1) of the Act, 21 U.S.C. section 360bbb-3(b)(1), unless the authorization is terminated or revoked sooner.  Performed at [facility], [place] [address], [place], [place] [address]   MRSA PCR Screening     Status: None   Collection Time: 08/13/19  5:50 AM   Specimen: Nasal Mucosa; Nasopharyngeal  Result Value Ref Range Status   MRSA by PCR NEGATIVE NEGATIVE Final    Comment:        The GeneXpert MRSA Assay (FDA approved for NASAL specimens only), is one component of a comprehensive MRSA colonization surveillance program. It is not intended to diagnose MRSA infection nor to guide or monitor treatment for MRSA infections. Performed at [facility], [place] [address], [place], [place] [address]          Radiology Studies: No results found.      Scheduled Meds:  atorvastatin  80 mg Oral q1800   docusate sodium  100 mg Oral BID   enoxaparin (LOVENOX) injection  30 mg Subcutaneous Q24H   famotidine  20 mg Oral QHS   gabapentin  100 mg Oral QHS   insulin aspart  0-9 Units Subcutaneous Q4H   insulin aspart  8 Units Subcutaneous TID WC   insulin glargine  20 Units Subcutaneous BID   levothyroxine  125 mcg Oral QAC breakfast   polyethylene glycol  17 g Oral BID   senna  1 tablet Oral Daily   timolol  1 drop Both Eyes BID   Continuous Infusions:   LOS: 7 days    Time spent:40 min    Virgina Deakins, Geraldo Docker, MD [facility] Pager [phone]  If  7PM-7AM, please contact night-coverage [email or web address] Password [email or web address] 08/19/2019, 3:12 PM

## 2019-08-19 NOTE — Plan of Care (Signed)

## 2019-08-19 NOTE — Progress Notes (Signed)
Physical Therapy Treatment Patient Details Name: Martin Richardson MRN: [id number] DOB: 1925/11/19 Today's Date: 08/19/2019    History of Present Illness Patient is a 86 y/o male who presents with right ankle fx s/p fall. Now s/p ORIF right ankle 7/17. PMH includes stroke, HTN, DM, glaucoma, CKD, left eye blindness.    PT Comments    Pt progressing well towards his physical therapy goals, demonstrating improved activity tolerance and ambulation distance. Initiated session with seated exercises. Pt requiring mod assist for transfers and ambulating 40 ft then 20 ft with a walker and close chair follow. Will continue to address transfers/gait training. Continue to recommend SNF for ongoing Physical Therapy.      Follow Up Recommendations  SNF     Equipment Recommendations  Rolling walker with 5" wheels;Wheelchair (measurements PT);Wheelchair cushion (measurements PT)    Recommendations for Other Services       Precautions / Restrictions Precautions Precautions: Fall Restrictions Weight Bearing Restrictions: Yes RLE Weight Bearing: Non weight bearing    Mobility  Bed Mobility               General bed mobility comments: OOB in chair  Transfers Overall transfer level: Needs assistance Equipment used: Rolling walker (2 wheeled) Transfers: Sit to/from Stand Sit to Stand: Mod assist         General transfer comment: ModA to rise from chair x 2, cues for scooting to edge of chair, placing RLE anteriorly, transition of hands to walker  Ambulation/Gait Ambulation/Gait assistance: Min assist Gait Distance (Feet): 60 Feet (40", 20") Assistive device: Rolling walker (2 wheeled) Gait Pattern/deviations: Step-to pattern Gait velocity: decreased   General Gait Details: Hop to pattern, decreased foot clearance, minA for stability. Chair follow utilized   Marine scientist [place] (Stroke Patients Only)       Balance Overall  balance assessment: Needs assistance   Sitting balance-[name]: Good       Standing balance-[name]: Poor Standing balance comment: Requires UE support and external support at times.                            Cognition Arousal/Alertness: Awake/alert Behavior During Therapy: WFL for tasks assessed/performed Overall Cognitive Status: Within Functional Limits for tasks assessed                                        Exercises General Exercises - Lower Extremity Long Arc Quad: Both;10 reps;Seated Hip Flexion/Marching: Both;10 reps;Seated    General Comments        Pertinent Vitals/Pain Pain Assessment: Faces Faces Pain Scale: Hurts a little bit Pain Location: RLE Pain Descriptors / Indicators: Sore;Aching Pain Intervention(s): Limited activity within patient's tolerance;Monitored during session    Home Living                      Prior Function            PT Goals (current goals can now be found in the care plan section) Acute Rehab PT Goals Patient Stated Goal: to get to rehab and return to PLOF Potential to Achieve Goals: Good Progress towards PT goals: Progressing toward goals    Frequency    Min 3X/week      PT Plan Current  plan remains appropriate    Co-evaluation              AM-PAC PT "6 Clicks" Mobility   Outcome Measure  Help needed turning from your back to your side while in a flat bed without using bedrails?: None Help needed moving from lying on your back to sitting on the side of a flat bed without using bedrails?: A Little Help needed moving to and from a bed to a chair (including a wheelchair)?: A Little Help needed standing up from a chair using your arms (e.g., wheelchair or bedside chair)?: A Lot Help needed to walk in hospital room?: A Little Help needed climbing 3-5 steps with a railing? : Total 6 Click Score: 16    End of Session Equipment Utilized During Treatment: Gait  belt Activity Tolerance: Patient tolerated treatment well Patient left: in chair;with call bell/phone within reach;with family/visitor present Nurse Communication: Mobility status PT Visit Diagnosis: Unsteadiness on feet (R26.81);Difficulty in walking, not elsewhere classified (R26.2);Pain Pain - Right/Left: Right Pain - part of body: Leg     Time: 1240-1300 PT Time Calculation (min) (ACUTE ONLY): 20 min  Charges:  $Gait Training: 8-22 mins                       Wyona Almas, PT, DPT Acute Rehabilitation Services Pager [phone] Office [phone]    Deno Etienne 08/19/2019, 1:57 PM

## 2019-08-20 LAB — CBC WITH DIFFERENTIAL/PLATELET
Abs Immature Granulocytes: 0.08 10*3/uL — ABNORMAL HIGH (ref 0.00–0.07)
Basophils Absolute: 0 10*3/uL (ref 0.0–0.1)
Basophils Relative: 1 %
Eosinophils Absolute: 0.7 10*3/uL — ABNORMAL HIGH (ref 0.0–0.5)
Eosinophils Relative: 11 %
HCT: 28.8 % — ABNORMAL LOW (ref 39.0–52.0)
Hemoglobin: 10.3 g/dL — ABNORMAL LOW (ref 13.0–17.0)
Immature Granulocytes: 1 %
Lymphocytes Relative: 32 %
Lymphs Abs: 2 10*3/uL (ref 0.7–4.0)
MCH: 31.8 pg (ref 26.0–34.0)
MCHC: 35.8 g/dL (ref 30.0–36.0)
MCV: 88.9 fL (ref 80.0–100.0)
Monocytes Absolute: 1 10*3/uL (ref 0.1–1.0)
Monocytes Relative: 16 %
Neutro Abs: 2.5 10*3/uL (ref 1.7–7.7)
Neutrophils Relative %: 39 %
Platelets: 238 10*3/uL (ref 150–400)
RBC: 3.24 MIL/uL — ABNORMAL LOW (ref 4.22–5.81)
RDW: 13.8 % (ref 11.5–15.5)
WBC: 6.2 10*3/uL (ref 4.0–10.5)
nRBC: 0 % (ref 0.0–0.2)

## 2019-08-20 LAB — COMPREHENSIVE METABOLIC PANEL
ALT: 17 U/L (ref 0–44)
AST: 16 U/L (ref 15–41)
Albumin: 2.7 g/dL — ABNORMAL LOW (ref 3.5–5.0)
Alkaline Phosphatase: 48 U/L (ref 38–126)
Anion gap: 8 (ref 5–15)
BUN: 55 mg/dL — ABNORMAL HIGH (ref 8–23)
CO2: 22 mmol/L (ref 22–32)
Calcium: 9.2 mg/dL (ref 8.9–10.3)
Chloride: 106 mmol/L (ref 98–111)
Creatinine, Ser: 1.94 mg/dL — ABNORMAL HIGH (ref 0.61–1.24)
GFR calc Af Amer: 34 mL/min — ABNORMAL LOW (ref >60)
GFR calc non Af Amer: 29 mL/min — ABNORMAL LOW (ref >60)
Glucose, Bld: 182 mg/dL — ABNORMAL HIGH (ref 70–99)
Potassium: 4.4 mmol/L (ref 3.5–5.1)
Sodium: 136 mmol/L (ref 135–145)
Total Bilirubin: 0.5 mg/dL (ref 0.3–1.2)
Total Protein: 5.3 g/dL — ABNORMAL LOW (ref 6.5–8.1)

## 2019-08-20 LAB — PHOSPHORUS: Phosphorus: 3.4 mg/dL (ref 2.5–4.6)

## 2019-08-20 LAB — GLUCOSE, CAPILLARY
Glucose-Capillary: 133 mg/dL — ABNORMAL HIGH (ref 70–99)
Glucose-Capillary: 165 mg/dL — ABNORMAL HIGH (ref 70–99)
Glucose-Capillary: 170 mg/dL — ABNORMAL HIGH (ref 70–99)
Glucose-Capillary: 189 mg/dL — ABNORMAL HIGH (ref 70–99)
Glucose-Capillary: 190 mg/dL — ABNORMAL HIGH (ref 70–99)
Glucose-Capillary: 201 mg/dL — ABNORMAL HIGH (ref 70–99)
Glucose-Capillary: 206 mg/dL — ABNORMAL HIGH (ref 70–99)

## 2019-08-20 LAB — MAGNESIUM: Magnesium: 2.2 mg/dL (ref 1.7–2.4)

## 2019-08-20 MED ORDER — INSULIN ASPART 100 UNIT/ML ~~LOC~~ SOLN
15.0000 [IU] | Freq: Three times a day (TID) | SUBCUTANEOUS | Status: DC
  Administered 2019-08-21 – 2019-08-22 (×4): 15 [IU] via SUBCUTANEOUS

## 2019-08-20 NOTE — Progress Notes (Signed)
PROGRESS NOTE    Martin Richardson  IWL:[id number] DOB: Mar 22, 1925 DOA: 08/12/2019 PCP: [name]     Brief Narrative:  86 y.o. WM PMHx CVA, DM type II uncontrolled with complication, CKD stage IV, Anemia, HTN,   Had a fall at home when he was walking to the bathroom.  Patient states he tripped on a rug and fell onto the floor.  Did not hit his head or lose consciousness.  Denies any chest pain or shortness of breath.  Patient just recently moved from [place].  ED Course: In the ER x-rays revealed right ankle fracture.  On-call orthopedic surgeon Dr. Griffin Basil was consulted who requested transfer to [facility] for likely surgical management.  Labs are remarkable for creatinine of 1.9 blood glucose 200 hemoglobin 12.5 WBC 9.2   Subjective: 7/24 afebrile overnight A/O x4, negative CP, negative S OB, negative N/V.  Positive RLE, s/p PT   Assessment & Plan: Covid vaccination; positive vaccination   Principal Problem:   Ankle fracture Active Problems:   CKD (chronic kidney disease), stage IV (HCC)   DM (diabetes mellitus), type 2 with renal complications ([place])   History of stroke   Essential hypertension   Closed right ankle fracture   DM neuropathy, painful ([place])  RIGHT ankle fracture -S/p mechanical fall -Dr. Griffin Basil requested transfer to [facility] for definitive management -7/17 s/p ORIF  DM type II uncontrolled with complication -1/74 hemoglobin A1c= 7.7 -Lantus 20 units BID -7/24 increase NovoLog 15 units qac -Sensitive SSI  DM neuropathy -7/23 increase Gabapentin 200 mg qhs  Essential HTN -Hydralazine PRN  CKD stage IV (baseline Cr 1.94) -Hold all nephrotoxic medication -Monitor closely Recent Labs    08/18/19 0139 08/19/19 0342 08/20/19 0337  CREATININE 2.09* 2.18* 1.94*  -7/24 at baseline with gentle hydration, continue normal saline 68ml/hr  Hx CVA -Does not take antiplatelet medication -Negative residual deficits -7/22 PT  consult; work with patient daily ROM, strengthening     Goals of care -7/21 spoke with N[facility] Whitman Hero patient has been denied CIR.  We will begin work-up for SNF   DVT prophylaxis: Lovenox Code Status: Full Family Communication: 7/23 1 daughter present at bedside/1 daughter on the phone discussed plan of care answered all questions Status is: Inpatient    Dispo: The patient is from: Home              Anticipated d/c is to: SNF              Anticipated d/c date is: 8/5              Patient currently unstable      Consultants:  Orthopedic surgery  Procedures/Significant Events:  7/17 RIGHT ankle OPEN REDUCTION INTERNAL FIXATION (ORIF) ANKLE FRACTURE   I have personally reviewed and interpreted all radiology studies and my findings are as above.  VENTILATOR SETTINGS:    Cultures   Antimicrobials:    Devices    LINES / TUBES:      Continuous Infusions: . sodium chloride 75 mL/hr at 08/19/19 1700     Objective: Vitals:   08/19/19 1930 08/20/19 0417 08/20/19 0500 08/20/19 0750  BP: (!) 143/65 (!) 141/63  (!) 133/72  Pulse: 68 61  68  Resp: 16 16  16   Temp: 97.8 F (36.6 C) 97.8 F (36.6 C)  98 F (36.7 C)  TempSrc: Oral Oral  Oral  SpO2: 98% 97%  (!) 79%  Weight:   (!) 100.7  kg   Height:        Intake/Output Summary (Last 24 hours) at 08/20/2019 1524 Last data filed at 08/20/2019 1300 Gross per 24 hour  Intake 1475.98 ml  Output 900 ml  Net 575.98 ml   Filed Weights   08/12/19 0328 08/20/19 0500  Weight: 100.7 kg (!) 100.7 kg    Examination:  General: A/O x4, No acute respiratory distress Eyes: negative scleral hemorrhage, negative anisocoria, negative icterus ENT: Negative Runny nose, negative gingival bleeding, Neck:  Negative scars, masses, torticollis, lymphadenopathy, JVD Lungs: Clear to auscultation bilaterally without wheezes or crackles Cardiovascular: Regular rate and rhythm without murmur gallop or rub normal S1 and  S2 Abdomen: negative abdominal pain, nondistended, positive soft, bowel sounds, no rebound, no ascites, no appreciable mass Extremities: RIGHT lower extremity in cast, able to wiggle toes, warm to touch. Skin: Negative rashes, lesions, ulcers Psychiatric:  Negative depression, negative anxiety, negative fatigue, negative mania  Central nervous system:  Cranial nerves II through XII intact, tongue/uvula midline, all extremities muscle strength 5/5, sensation intact throughout, negative dysarthria, negative expressive aphasia, negative receptive aphasia.  .     Data Reviewed: Care during the described time interval was provided by me .  I have reviewed this patient's available data, including medical history, events of note, physical examination, and all test results as part of my evaluation.  CBC: Recent Labs  Lab 08/15/19 0716 08/17/19 0910 08/18/19 0139 08/19/19 0342 08/20/19 0337  WBC 5.5 7.4 6.6 5.8 6.2  NEUTROABS  --  3.4 2.9 2.1 2.5  HGB 10.8* 12.0* 10.8* 10.2* 10.3*  HCT 31.2* 34.5* 30.8* 29.4* 28.8*  MCV 89.1 88.7 88.3 88.6 88.9  PLT 170 230 219 221 295   Basic Metabolic Panel: Recent Labs  Lab 08/15/19 0716 08/17/19 0910 08/18/19 0139 08/19/19 0342 08/20/19 0337  NA 137 136 133* 136 136  K 4.7 4.5 4.4 4.3 4.4  CL 107 104 104 106 106  CO2 25 25 22 22 22   GLUCOSE 91 145* 229* 228* 182*  BUN 48* 52* 60* 60* 55*  CREATININE 2.12* 2.01* 2.09* 2.18* 1.94*  CALCIUM 9.2 9.7 9.2 9.2 9.2  MG  --  2.1 2.1 2.2 2.2  PHOS  --  3.1 2.8 3.1 3.4   GFR: Estimated Creatinine Clearance: 28.9 mL/min (A) (by C-G formula based on SCr of 1.94 mg/dL (H)). Liver Function Tests: Recent Labs  Lab 08/17/19 0910 08/18/19 0139 08/19/19 0342 08/20/19 0337  AST 15 18 15 16   ALT 9 11 14 17   ALKPHOS 54 51 46 48  BILITOT 1.1 0.8 0.2* 0.5  PROT 6.6 5.7* 5.4* 5.3*  ALBUMIN 3.2* 3.0* 2.8* 2.7*   No results for input(s): LIPASE, AMYLASE in the last 168 hours. No results for input(s):  AMMONIA in the last 168 hours. Coagulation Profile: No results for input(s): INR, PROTIME in the last 168 hours. Cardiac Enzymes: No results for input(s): CKTOTAL, CKMB, CKMBINDEX, TROPONINI in the last 168 hours. BNP (last 3 results) No results for input(s): PROBNP in the last 8760 hours. HbA1C: Recent Labs    08/18/19 0139  HGBA1C 7.7*   CBG: Recent Labs  Lab 08/19/19 2015 08/20/19 0009 08/20/19 0418 08/20/19 0747 08/20/19 1155  GLUCAP 177* 190* 165* 133* 201*   Lipid Profile: Recent Labs    08/18/19 0139  CHOL 70  HDL 24*  LDLCALC 17  TRIG 145  CHOLHDL 2.9   Thyroid Function Tests: No results for input(s): TSH, T4TOTAL, FREET4, T3FREE, THYROIDAB in the  last 72 hours. Anemia Panel: No results for input(s): VITAMINB12, FOLATE, FERRITIN, TIBC, IRON, RETICCTPCT in the last 72 hours. Sepsis Labs: No results for input(s): PROCALCITON, LATICACIDVEN in the last 168 hours.  Recent Results (from the past 240 hour(s))  SARS Coronavirus 2 by RT PCR (hospital order, performed in [facility] hospital lab) Nasopharyngeal Nasopharyngeal Swab     Status: None   Collection Time: 08/12/19  5:40 AM   Specimen: Nasopharyngeal Swab  Result Value Ref Range Status   SARS Coronavirus 2 NEGATIVE NEGATIVE Final    Comment: (NOTE) SARS-CoV-2 target nucleic acids are NOT DETECTED.  The SARS-CoV-2 RNA is generally detectable in upper and lower respiratory specimens during the acute phase of infection. The lowest concentration of SARS-CoV-2 viral copies this assay can detect is 250 copies / mL. A negative result does not preclude SARS-CoV-2 infection and should not be used as the sole basis for treatment or other patient management decisions.  A negative result may occur with improper specimen collection / handling, submission of specimen other than nasopharyngeal swab, presence of viral mutation(s) within the areas targeted by this assay, and inadequate number of viral copies (<250  copies / mL). A negative result must be combined with clinical observations, patient history, and epidemiological information.  Fact Sheet for Patients:   StrictlyIdeas.no  Fact Sheet for Healthcare Providers: BankingDealers.co.za  This test is not yet approved or  cleared by the [place] FDA and has been authorized for detection and/or diagnosis of SARS-CoV-2 by FDA under an Emergency Use Authorization (EUA).  This EUA will remain in effect (meaning this test can be used) for the duration of the COVID-19 declaration under Section 564(b)(1) of the Act, 21 U.S.C. section 360bbb-3(b)(1), unless the authorization is terminated or revoked sooner.  Performed at [facility], [place] [address] [place], [place] [address]   MRSA PCR Screening     Status: None   Collection Time: 08/13/19  5:50 AM   Specimen: Nasal Mucosa; Nasopharyngeal  Result Value Ref Range Status   MRSA by PCR NEGATIVE NEGATIVE Final    Comment:        The GeneXpert MRSA Assay (FDA approved for NASAL specimens only), is one component of a comprehensive MRSA colonization surveillance program. It is not intended to diagnose MRSA infection nor to guide or monitor treatment for MRSA infections. Performed at [facility], [place] [address] [place], [place] [address]   SARS CORONAVIRUS 2 (TAT 6-24 HRS) Nasopharyngeal Nasopharyngeal Swab     Status: None   Collection Time: 08/19/19 10:09 AM   Specimen: Nasopharyngeal Swab  Result Value Ref Range Status   SARS Coronavirus 2 NEGATIVE NEGATIVE Final    Comment: (NOTE) SARS-CoV-2 target nucleic acids are NOT DETECTED.  The SARS-CoV-2 RNA is generally detectable in upper and lower respiratory specimens during the acute phase of infection. Negative results do not preclude SARS-CoV-2 infection, do not rule out co-infections with other pathogens, and should not be used as the sole basis for treatment  or other patient management decisions. Negative results must be combined with clinical observations, patient history, and epidemiological information. The expected result is Negative.  Fact Sheet for Patients: SugarRoll.be  Fact Sheet for Healthcare Providers: https://www.-mathews.com/  This test is not yet approved or cleared by the [place] FDA and  has been authorized for detection and/or diagnosis of SARS-CoV-2 by FDA under an Emergency Use Authorization (EUA). This EUA will remain  in effect (meaning this test can be  used) for the duration of the COVID-19 declaration under Se ction 564(b)(1) of the Act, 21 U.S.C. section 360bbb-3(b)(1), unless the authorization is terminated or revoked sooner.  Performed at [facility], [place] [address] [place], [place] [address]          Radiology Studies: No results found.      Scheduled Meds: . atorvastatin  80 mg Oral q1800  . docusate sodium  100 mg Oral BID  . enoxaparin (LOVENOX) injection  30 mg Subcutaneous Q24H  . famotidine  20 mg Oral QHS  . gabapentin  200 mg Oral QHS  . insulin aspart  0-9 Units Subcutaneous Q4H  . insulin aspart  12 Units Subcutaneous TID WC  . insulin glargine  20 Units Subcutaneous BID  . levothyroxine  125 mcg Oral QAC breakfast  . polyethylene glycol  17 g Oral BID  . senna  1 tablet Oral Daily  . timolol  1 drop Both Eyes BID   Continuous Infusions: . sodium chloride 75 mL/hr at 08/19/19 1700     LOS: 8 days    Time spent:40 min    Tramayne Sebesta, Geraldo Docker, MD [facility] Pager [phone]  If 7PM-7AM, please contact night-coverage [email or web address] Password [facility] 08/20/2019, 3:24 PM

## 2019-08-20 NOTE — Plan of Care (Signed)

## 2019-08-20 NOTE — TOC Progression Note (Signed)
Transition of Ca re [facility]) - Progression Note    Patient Details  Name: Martin Richardson MRN: [id number] Date of Birth: 03/25/1925  Transition of Care [facility]) CM/SW Contact  Doria Fern [place], [place] Phone Number: 08/20/2019, 4:20 PM  Clinical Narrative:    Phone call to [employer] to check insurance authorization-authorization is pending-remains under review.  Social Work to continue to check status of insurance [place], [place]      Expected Discharge Plan: [place] Barriers to Discharge: Insurance Authorization, No SNF bed  Expected Discharge Plan and Services Expected Discharge Plan: [place]                                               Social Determinants of Health (SDOH) Interventions    Readmission Risk Interventions No flowsheet data found.

## 2019-08-21 LAB — CBC WITH DIFFERENTIAL/PLATELET
Abs Immature Granulocytes: 0.08 10*3/uL — ABNORMAL HIGH (ref 0.00–0.07)
Basophils Absolute: 0 10*3/uL (ref 0.0–0.1)
Basophils Relative: 1 %
Eosinophils Absolute: 0.5 10*3/uL (ref 0.0–0.5)
Eosinophils Relative: 9 %
HCT: 27.9 % — ABNORMAL LOW (ref 39.0–52.0)
Hemoglobin: 9.6 g/dL — ABNORMAL LOW (ref 13.0–17.0)
Immature Granulocytes: 2 %
Lymphocytes Relative: 34 %
Lymphs Abs: 1.9 10*3/uL (ref 0.7–4.0)
MCH: 30.8 pg (ref 26.0–34.0)
MCHC: 34.4 g/dL (ref 30.0–36.0)
MCV: 89.4 fL (ref 80.0–100.0)
Monocytes Absolute: 0.9 10*3/uL (ref 0.1–1.0)
Monocytes Relative: 16 %
Neutro Abs: 2.1 10*3/uL (ref 1.7–7.7)
Neutrophils Relative %: 38 %
Platelets: 244 10*3/uL (ref 150–400)
RBC: 3.12 MIL/uL — ABNORMAL LOW (ref 4.22–5.81)
RDW: 13.7 % (ref 11.5–15.5)
WBC: 5.5 10*3/uL (ref 4.0–10.5)
nRBC: 0 % (ref 0.0–0.2)

## 2019-08-21 LAB — GLUCOSE, CAPILLARY
Glucose-Capillary: 157 mg/dL — ABNORMAL HIGH (ref 70–99)
Glucose-Capillary: 173 mg/dL — ABNORMAL HIGH (ref 70–99)
Glucose-Capillary: 214 mg/dL — ABNORMAL HIGH (ref 70–99)
Glucose-Capillary: 215 mg/dL — ABNORMAL HIGH (ref 70–99)
Glucose-Capillary: 234 mg/dL — ABNORMAL HIGH (ref 70–99)
Glucose-Capillary: 64 mg/dL — ABNORMAL LOW (ref 70–99)

## 2019-08-21 LAB — COMPREHENSIVE METABOLIC PANEL
ALT: 14 U/L (ref 0–44)
AST: 17 U/L (ref 15–41)
Albumin: 2.6 g/dL — ABNORMAL LOW (ref 3.5–5.0)
Alkaline Phosphatase: 51 U/L (ref 38–126)
Anion gap: 6 (ref 5–15)
BUN: 51 mg/dL — ABNORMAL HIGH (ref 8–23)
CO2: 23 mmol/L (ref 22–32)
Calcium: 9 mg/dL (ref 8.9–10.3)
Chloride: 107 mmol/L (ref 98–111)
Creatinine, Ser: 1.96 mg/dL — ABNORMAL HIGH (ref 0.61–1.24)
GFR calc Af Amer: 33 mL/min — ABNORMAL LOW (ref >60)
GFR calc non Af Amer: 29 mL/min — ABNORMAL LOW (ref >60)
Glucose, Bld: 249 mg/dL — ABNORMAL HIGH (ref 70–99)
Potassium: 4.6 mmol/L (ref 3.5–5.1)
Sodium: 136 mmol/L (ref 135–145)
Total Bilirubin: 0.6 mg/dL (ref 0.3–1.2)
Total Protein: 5 g/dL — ABNORMAL LOW (ref 6.5–8.1)

## 2019-08-21 LAB — MAGNESIUM: Magnesium: 2 mg/dL (ref 1.7–2.4)

## 2019-08-21 LAB — PHOSPHORUS: Phosphorus: 3.2 mg/dL (ref 2.5–4.6)

## 2019-08-21 NOTE — TOC Progression Note (Signed)
Transition of Care [facility]) - Progression Note    Patient Details  Name: Martin Richardson MRN: [id number] Date of Birth: January 19, 1926  Transition of Care [facility]) CM/SW Contact  Elliot Gurney [place], [place] Phone Number: [phone] 08/21/2019, 1:25 PM  Clinical Narrative:    Authorization received from [facility]. Plan authorization identification number is [id number] Patient approved 08/21/19-08/24/19. Next review date will be 08/24/19-[facility] [place]. Continued stay clinicals to be faxed to [phone]. Contact numbner [phone].  Phone call to [facility], spoke with Elnita Maxwell who stated that they have a cut off time of 12 pm for admissions. Patient will have a bed available for him in the morning.   [address], LCSW Transition of Car [phone]     Expected Discharge Plan: Skilled Nursing Facility Barriers to Discharge: Insurance Authorization, No SNF bed  Expected Discharge Plan and Services Expected Discharge Plan: [place]                                               Social Determinants of Health (SDOH) Interventions    Readmission Risk Interventions No flowsheet data found.

## 2019-08-21 NOTE — Plan of Care (Signed)
  Problem: Education: Goal: Knowledge of General Education information will improve Description: Including pain rating scale, medication(s)/side effects and non-pharmacologic comfort measures Outcome: Adequate for Discharge   Problem: Health Behavior/Discharge Planning: Goal: Ability to manage health-related needs will improve Outcome: Adequate for Discharge   Problem: Clinical Measurements: Goal: Ability to maintain clinical measurements within normal limits will improve Outcome: Adequate for Discharge   Problem: Activity: Goal: Risk for activity intolerance will decrease Outcome: Adequate for Discharge   Problem: Nutrition: Goal: Adequate nutrition will be maintained Outcome: Adequate for Discharge   Problem: Coping: Goal: Level of anxiety will decrease Outcome: Adequate for Discharge   Problem: Pain Managment: Goal: General experience of comfort will improve Outcome: Adequate for Discharge   Problem: Safety: Goal: Ability to remain free from injury will improve Outcome: Adequate for Discharge   Problem: Skin Integrity: Goal: Risk for impaired skin integrity will decrease Outcome: Adequate for Discharge

## 2019-08-21 NOTE — Plan of Care (Signed)

## 2019-08-21 NOTE — Progress Notes (Signed)
PROGRESS NOTE    Martin Richardson  WJX:[id number] DOB: [birth year] DOA: 08/12/2019 PCP: [name]     Brief Narrative:  86 y.o. WM PMHx CVA, DM type II uncontrolled with complication, CKD stage IV, Anemia, HTN,   Had a fall at home when he was walking to the bathroom.  Patient states he tripped on a rug and fell onto the floor.  Did not hit his head or lose consciousness.  Denies any chest pain or shortness of breath.  Patient just recently moved from [place].  ED Course: In the ER x-rays revealed right ankle fracture.  On-call orthopedic surgeon Dr. Griffin Basil was consulted who requested transfer to [facility] for likely surgical management.  Labs are remarkable for creatinine of 1.9 blood glucose 200 hemoglobin 12.5 WBC 9.2   Subjective: 7/25 afebrile overnight A/O x4, negative CP, negative S OB,  Assessment & Plan: Covid vaccination; positive vaccination   Principal Problem:   Ankle fracture Active Problems:   CKD (chronic kidney disease), stage IV (HCC)   DM (diabetes mellitus), type 2 with renal complications ([place])   History of stroke   Essential hypertension   Closed right ankle fracture   DM neuropathy, painful ([place])  RIGHT ankle fracture -S/p mechanical fall -Dr. Griffin Basil requested transfer to [facility] for definitive management -7/17 s/p ORIF  DM type II uncontrolled with complication -2/13 hemoglobin A1c= 7.7 -Lantus 20 units BID -7/24 increase NovoLog 15 units qac -Sensitive SSI  DM neuropathy -7/23 increase Gabapentin 200 mg qhs  Essential HTN -Hydralazine PRN  CKD stage IV (baseline Cr 1.94) -Hold all nephrotoxic medication -Monitor closely Recent Labs    08/19/19 0342 08/20/19 0337 08/21/19 0234  CREATININE 2.18* 1.94* 1.96*  -7/25 at baseline DC normal saline  Hx CVA -Does not take antiplatelet medication -Negative residual deficits -7/22 PT consult; work with patient daily ROM, strengthening     Goals of  care -7/21 spoke with NCM [facility] Hero patient has been denied CIR.  We will begin work-up for SNF   DVT prophylaxis: Lovenox Code Status: Full Family Communication: 7/23 1 daughter present at bedside/1 daughter on the phone discussed plan of care answered all questions Status is: Inpatient    Dispo: The patient is from: Home              Anticipated d/c is to: SNF              Anticipated d/c date is: 8/5              Patient currently unstable      Consultants:  Orthopedic surgery  Procedures/Significant Events:  7/17 RIGHT ankle OPEN REDUCTION INTERNAL FIXATION (ORIF) ANKLE FRACTURE   I have personally reviewed and interpreted all radiology studies and my findings are as above.  VENTILATOR SETTINGS:    Cultures   Antimicrobials:    Devices    LINES / TUBES:      Continuous Infusions: . sodium chloride 75 mL/hr at 08/21/19 1141     Objective: Vitals:   08/20/19 1944 08/21/19 0402 08/21/19 0614 08/21/19 0720  BP: (!) 121/91 (!) 140/65  (!) 135/80  Pulse: 69 55  65  Resp: 16 15  16   Temp: 98.5 F (36.9 C) 98.1 F (36.7 C)  98.2 F (36.8 C)  TempSrc: Oral Oral  Oral  SpO2: 95% 97%  96%  Weight:   (!) 105.4 kg   Height:        Intake/Output Summary (  Last 24 hours) at 08/21/2019 1533 Last data filed at 08/21/2019 0900 Gross per 24 hour  Intake 960 ml  Output 1100 ml  Net -140 ml   Filed Weights   08/12/19 0328 08/20/19 0500 08/21/19 2426  Weight: 100.7 kg (!) 100.7 kg (!) 105.4 kg    Examination:  General: A/O x4, No acute respiratory distress Eyes: negative scleral hemorrhage, negative anisocoria, negative icterus ENT: Negative Runny nose, negative gingival bleeding, Neck:  Negative scars, masses, torticollis, lymphadenopathy, JVD Lungs: Clear to auscultation bilaterally without wheezes or crackles Cardiovascular: Regular rate and rhythm without murmur gallop or rub normal S1 and S2 Abdomen: negative abdominal pain, nondistended,  positive soft, bowel sounds, no rebound, no ascites, no appreciable mass Extremities: RIGHT lower extremity in cast, able to wiggle toes, warm to touch. Skin: Negative rashes, lesions, ulcers Psychiatric:  Negative depression, negative anxiety, negative fatigue, negative mania  Central nervous system:  Cranial nerves II through XII intact, tongue/uvula midline, all extremities muscle strength 5/5, sensation intact throughout, negative dysarthria, negative expressive aphasia, negative receptive aphasia.  .     Data Reviewed: Care during the described time interval was provided by me .  I have reviewed this patient's available data, including medical history, events of note, physical examination, and all test results as part of my evaluation.  CBC: Recent Labs  Lab 08/17/19 0910 08/18/19 0139 08/19/19 0342 08/20/19 0337 08/21/19 0234  WBC 7.4 6.6 5.8 6.2 5.5  NEUTROABS 3.4 2.9 2.1 2.5 2.1  HGB 12.0* 10.8* 10.2* 10.3* 9.6*  HCT 34.5* 30.8* 29.4* 28.8* 27.9*  MCV 88.7 88.3 88.6 88.9 89.4  PLT 230 219 221 238 834   Basic Metabolic Panel: Recent Labs  Lab 08/17/19 0910 08/18/19 0139 08/19/19 0342 08/20/19 0337 08/21/19 0234  NA 136 133* 136 136 136  K 4.5 4.4 4.3 4.4 4.6  CL 104 104 106 106 107  CO2 25 22 22 22 23   GLUCOSE 145* 229* 228* 182* 249*  BUN 52* 60* 60* 55* 51*  CREATININE 2.01* 2.09* 2.18* 1.94* 1.96*  CALCIUM 9.7 9.2 9.2 9.2 9.0  MG 2.1 2.1 2.2 2.2 2.0  PHOS 3.1 2.8 3.1 3.4 3.2   GFR: Estimated Creatinine Clearance: 29.3 mL/min (A) (by C-G formula based on SCr of 1.96 mg/dL (H)). Liver Function Tests: Recent Labs  Lab 08/17/19 0910 08/18/19 0139 08/19/19 0342 08/20/19 0337 08/21/19 0234  AST 15 18 15 16 17   ALT 9 11 14 17 14   ALKPHOS 54 51 46 48 51  BILITOT 1.1 0.8 0.2* 0.5 0.6  PROT 6.6 5.7* 5.4* 5.3* 5.0*  ALBUMIN 3.2* 3.0* 2.8* 2.7* 2.6*   No results for input(s): LIPASE, AMYLASE in the last 168 hours. No results for input(s): AMMONIA in the  last 168 hours. Coagulation Profile: No results for input(s): INR, PROTIME in the last 168 hours. Cardiac Enzymes: No results for input(s): CKTOTAL, CKMB, CKMBINDEX, TROPONINI in the last 168 hours. BNP (last 3 results) No results for input(s): PROBNP in the last 8760 hours. HbA1C: No results for input(s): HGBA1C in the last 72 hours. CBG: Recent Labs  Lab 08/20/19 1942 08/20/19 2352 08/21/19 0403 08/21/19 0809 08/21/19 1148  GLUCAP 189* 206* 214* 157* 64*   Lipid Profile: No results for input(s): CHOL, HDL, LDLCALC, TRIG, CHOLHDL, LDLDIRECT in the last 72 hours. Thyroid Function Tests: No results for input(s): TSH, T4TOTAL, FREET4, T3FREE, THYROIDAB in the last 72 hours. Anemia Panel: No results for input(s): VITAMINB12, FOLATE, FERRITIN, TIBC, IRON, RETICCTPCT in the  last 72 hours. Sepsis Labs: No results for input(s): PROCALCITON, LATICACIDVEN in the last 168 hours.  Recent Results (from the past 240 hour(s))  SARS Coronavirus 2 by RT PCR (hospital order, performed in [facility] hospital lab) Nasopharyngeal Nasopharyngeal Swab     Status: None   Collection Time: 08/12/19  5:40 AM   Specimen: Nasopharyngeal Swab  Result Value Ref Range Status   SARS Coronavirus 2 NEGATIVE NEGATIVE Final    Comment: (NOTE) SARS-CoV-2 target nucleic acids are NOT DETECTED.  The SARS-CoV-2 RNA is generally detectable in upper and lower respiratory specimens during the acute phase of infection. The lowest concentration of SARS-CoV-2 viral copies this assay can detect is 250 copies / mL. A negative result does not preclude SARS-CoV-2 infection and should not be used as the sole basis for treatment or other patient management decisions.  A negative result may occur with improper specimen collection / handling, submission of specimen other than nasopharyngeal swab, presence of viral mutation(s) within the areas targeted by this assay, and inadequate number of viral copies (<250 copies / mL).  A negative result must be combined with clinical observations, patient history, and epidemiological information.  Fact Sheet for Patients:   StrictlyIdeas.no  Fact Sheet for Healthcare Providers: BankingDealers.co.za  This test is not yet approved or  cleared by the [place] FDA and has been authorized for detection and/or diagnosis of SARS-CoV-2 by FDA under an Emergency Use Authorization (EUA).  This EUA will remain in effect (meaning this test can be used) for the duration of the COVID-19 declaration under Section 564(b)(1) of the Act, 21 U.S.C. section 360bbb-3(b)(1), unless the authorization is terminated or revoked sooner.  Performed at [facility], [place] [address] [place], [place] [address]   MRSA PCR Screening     Status: None   Collection Time: 08/13/19  5:50 AM   Specimen: Nasal Mucosa; Nasopharyngeal  Result Value Ref Range Status   MRSA by PCR NEGATIVE NEGATIVE Final    Comment:        The GeneXpert MRSA Assay (FDA approved for NASAL specimens only), is one component of a comprehensive MRSA colonization surveillance program. It is not intended to diagnose MRSA infection nor to guide or monitor treatment for MRSA infections. Performed at [facility], [place] [address], [place], [place] [address]   SARS CORONAVIRUS 2 (TAT 6-24 HRS) Nasopharyngeal Nasopharyngeal Swab     Status: None   Collection Time: 08/19/19 10:09 AM   Specimen: Nasopharyngeal Swab  Result Value Ref Range Status   SARS Coronavirus 2 NEGATIVE NEGATIVE Final    Comment: (NOTE) SARS-CoV-2 target nucleic acids are NOT DETECTED.  The SARS-CoV-2 RNA is generally detectable in upper and lower respiratory specimens during the acute phase of infection. Negative results do not preclude SARS-CoV-2 infection, do not rule out co-infections with other pathogens, and should not be used as the sole basis for treatment or other  patient management decisions. Negative results must be combined with clinical observations, patient history, and epidemiological information. The expected result is Negative.  Fact Sheet for Patients: SugarRoll.be  Fact Sheet for Healthcare Providers: https://www.-mathews.com/  This test is not yet approved or cleared by the [place] FDA and  has been authorized for detection and/or diagnosis of SARS-CoV-2 by FDA under an Emergency Use Authorization (EUA). This EUA will remain  in effect (meaning this test can be used) for the duration of the COVID-19 declaration under Se ction 564(b)(1) of the Act, 21 U.S.C.  section 360bbb-3(b)(1), unless the authorization is terminated or revoked sooner.  Performed at [facility], [place] [address] [place], [place] [address]          Radiology Studies: No results found.      Scheduled Meds: . atorvastatin  80 mg Oral q1800  . docusate sodium  100 mg Oral BID  . enoxaparin (LOVENOX) injection  30 mg Subcutaneous Q24H  . famotidine  20 mg Oral QHS  . gabapentin  200 mg Oral QHS  . insulin aspart  0-9 Units Subcutaneous Q4H  . insulin aspart  15 Units Subcutaneous TID WC  . insulin glargine  20 Units Subcutaneous BID  . levothyroxine  125 mcg Oral QAC breakfast  . polyethylene glycol  17 g Oral BID  . senna  1 tablet Oral Daily  . timolol  1 drop Both Eyes BID   Continuous Infusions: . sodium chloride 75 mL/hr at 08/21/19 1141     LOS: 9 days    Time spent:40 min    Ethelene Closser, Geraldo Docker, MD [facility] Pager [phone]  If 7PM-7AM, please contact night-coverage [email or web address] Password [facility] 08/21/2019, 3:33 PM

## 2019-08-21 NOTE — TOC Progression Note (Signed)
Transition of Care [facility]) - Progression Note    Patient Details  Name: Martin Richardson MRN: [id number] Date of Birth: [birth year]  Transition of Care [facility]) CM/SW Contact  Elliot Gurney [place], [place] Phone Number: [phone] 08/21/2019, 10:31 AM  Clinical Narrative:    Follow up call to [facility] to check status of insurance authorization for SNF placement-authorization still pending- authorization request sent to the escalation team to work on the authorization as soon as possible.   [address] LCSW Transition of Care [phone]    Expected Discharge Plan: Skilled Nursing Facility Barriers to Discharge: Insurance Authorization, No SNF bed  Expected Discharge Plan and Services Expected Discharge Plan: [place]                                               Social Determinants of Health (SDOH) Interventions    Readmission Risk Interventions No flowsheet data found.

## 2019-08-21 NOTE — Plan of Care (Signed)
  Problem: Activity: Goal: Risk for activity intolerance will decrease Outcome: Progressing   Problem: Nutrition: Goal: Adequate nutrition will be maintained Outcome: Progressing   Problem: Coping: Goal: Level of anxiety will decrease Outcome: Progressing   Problem: Elimination: Goal: Will not experience complications related to bowel motility Outcome: Progressing   Problem: Pain Managment: Goal: General experience of comfort will improve Outcome: Progressing   

## 2019-08-21 NOTE — Progress Notes (Signed)
Blood glucose 64- without symptoms.  The patient was given orange juice

## 2019-08-22 LAB — CBC WITH DIFFERENTIAL/PLATELET
Abs Immature Granulocytes: 0.08 10*3/uL — ABNORMAL HIGH (ref 0.00–0.07)
Basophils Absolute: 0 10*3/uL (ref 0.0–0.1)
Basophils Relative: 1 %
Eosinophils Absolute: 0.7 10*3/uL — ABNORMAL HIGH (ref 0.0–0.5)
Eosinophils Relative: 12 %
HCT: 29.7 % — ABNORMAL LOW (ref 39.0–52.0)
Hemoglobin: 10.5 g/dL — ABNORMAL LOW (ref 13.0–17.0)
Immature Granulocytes: 1 %
Lymphocytes Relative: 29 %
Lymphs Abs: 1.8 10*3/uL (ref 0.7–4.0)
MCH: 31.4 pg (ref 26.0–34.0)
MCHC: 35.4 g/dL (ref 30.0–36.0)
MCV: 88.9 fL (ref 80.0–100.0)
Monocytes Absolute: 1 10*3/uL (ref 0.1–1.0)
Monocytes Relative: 16 %
Neutro Abs: 2.6 10*3/uL (ref 1.7–7.7)
Neutrophils Relative %: 41 %
Platelets: 266 10*3/uL (ref 150–400)
RBC: 3.34 MIL/uL — ABNORMAL LOW (ref 4.22–5.81)
RDW: 13.7 % (ref 11.5–15.5)
WBC: 6.2 10*3/uL (ref 4.0–10.5)
nRBC: 0 % (ref 0.0–0.2)

## 2019-08-22 LAB — COMPREHENSIVE METABOLIC PANEL
ALT: 16 U/L (ref 0–44)
AST: 14 U/L — ABNORMAL LOW (ref 15–41)
Albumin: 2.7 g/dL — ABNORMAL LOW (ref 3.5–5.0)
Alkaline Phosphatase: 56 U/L (ref 38–126)
Anion gap: 6 (ref 5–15)
BUN: 41 mg/dL — ABNORMAL HIGH (ref 8–23)
CO2: 24 mmol/L (ref 22–32)
Calcium: 9.2 mg/dL (ref 8.9–10.3)
Chloride: 108 mmol/L (ref 98–111)
Creatinine, Ser: 1.85 mg/dL — ABNORMAL HIGH (ref 0.61–1.24)
GFR calc Af Amer: 36 mL/min — ABNORMAL LOW (ref >60)
GFR calc non Af Amer: 31 mL/min — ABNORMAL LOW (ref >60)
Glucose, Bld: 141 mg/dL — ABNORMAL HIGH (ref 70–99)
Potassium: 4.3 mmol/L (ref 3.5–5.1)
Sodium: 138 mmol/L (ref 135–145)
Total Bilirubin: 0.6 mg/dL (ref 0.3–1.2)
Total Protein: 5.3 g/dL — ABNORMAL LOW (ref 6.5–8.1)

## 2019-08-22 LAB — GLUCOSE, CAPILLARY
Glucose-Capillary: 145 mg/dL — ABNORMAL HIGH (ref 70–99)
Glucose-Capillary: 161 mg/dL — ABNORMAL HIGH (ref 70–99)
Glucose-Capillary: 184 mg/dL — ABNORMAL HIGH (ref 70–99)

## 2019-08-22 LAB — MAGNESIUM: Magnesium: 2.1 mg/dL (ref 1.7–2.4)

## 2019-08-22 LAB — PHOSPHORUS: Phosphorus: 3.5 mg/dL (ref 2.5–4.6)

## 2019-08-22 MED ORDER — INSULIN PEN NEEDLE 29G X 12MM MISC: 0 refills | Status: AC

## 2019-08-22 MED ORDER — INSULIN ASPART 100 UNIT/ML FLEXPEN: 15.0000 [IU] | PEN_INJECTOR | Freq: Three times a day (TID) | SUBCUTANEOUS | 0 refills | Status: DC

## 2019-08-22 MED ORDER — GABAPENTIN 100 MG PO CAPS: 200.0000 mg | ORAL_CAPSULE | Freq: Every day | ORAL | 0 refills | Status: AC

## 2019-08-22 NOTE — TOC Transition Note (Signed)
Transition of Care [facility]) - CM/SW Discharge Note   Patient Details  Name: Martin Richardson MRN: [id number] Date of Birth: 06/14/1925  Transition of Care [facility]) CM/SW Contact:  Sable Feil, LCSW Phone Number: 08/22/2019, 12:10 PM   Clinical Narrative:  Patient medically stable and discharging to [facility] today. Discharge clinicals transmitted to facility, communicated with nurse and call made to daughter [name] [phone]) regarding discharge. Patient will be transported by non-emergency ambulance transport.     Final next level of care: Skilled Nursing Facility Barriers to Discharge: Barriers Resolved (Received insurance auth)   Patient Goals and CMS Choice Patient states their goals for this hospitalization and ongoing recovery are:: To get better CMS Medicare.gov Compare Post Acute Care list provided to:: Patient Choice offered to / list presented to : Patient, Adult Children  Discharge Placement PASRR number recieved: 08/17/19            Patient chooses bed at: [facility] Patient to be transferred to facility by: Non-emergency ambulance transport Name of family member notified: Daughter Rosalee Kaufman [phone] Patient and family notified of of transfer: 08/22/19  Discharge Plan and Services                                   Social Determinants of Health (SDOH) Interventions  No SDOH interventions requested or needed at discharge   Readmission Risk Interventions No flowsheet data found.

## 2019-08-22 NOTE — Progress Notes (Signed)
Contacted Dr. Sherral Hammers to notify of patient complaint of dizziness x1 during night shift.  Patient states this passed after "Short time" and that he feels well now.  No further orders received and MD states patient may discharge to SNF as ordered.

## 2019-08-22 NOTE — Progress Notes (Signed)
Physical Therapy Treatment Patient Details Name: Martin Richardson MRN: [id number] DOB: 09/08/1925 Today's Date: 08/22/2019    History of Present Illness Patient is a 86 y/o male who presents with right ankle fx s/p fall. Now s/p ORIF right ankle 7/17. PMH includes stroke, HTN, DM, glaucoma, CKD, left eye blindness.    PT Comments    Pt had an episode of vertigo this AM while laying in bed, reported that it lasted about 10 mins. Monitored closely during activity but pt did not experience while up. Worked on safety with mobility today as pt tends to rush and take small, ineffective hops when he becomes fatigued. Had him stop and take a standing rest break when this occurred and then continue with slower, larger hops with better control. Pt ambulated 30' with RW and min A. Min A needed to rise from recliner to RW while keeping RLE NWB. PT will continue to follow.    Follow Up Recommendations  SNF     Equipment Recommendations  Rolling walker with 5" wheels;Wheelchair (measurements PT);Wheelchair cushion (measurements PT)    Recommendations for Other Services       Precautions / Restrictions Precautions Precautions: Fall Precaution Comments: pt with neuropathy and vertigo Restrictions Weight Bearing Restrictions: Yes RLE Weight Bearing: Non weight bearing    Mobility  Bed Mobility Overal bed mobility: Modified Independent             General bed mobility comments: OOB in chair  Transfers Overall transfer level: Needs assistance Equipment used: Rolling walker (2 wheeled) Transfers: Sit to/from Stand Sit to Stand: Min assist         General transfer comment: min A for power up from recliner and fwd wt shift. Pt effectively keeping RLE NWB with transfers  Ambulation/Gait Ambulation/Gait assistance: Min assist Gait Distance (Feet): 30 Feet Assistive device: Rolling walker (2 wheeled) Gait Pattern/deviations: Step-to pattern Gait velocity: decreased Gait velocity  interpretation: <1.31 ft/sec, indicative of household ambulator General Gait Details: min A to steady. Worked on turning. Pt very fatigued after 30'. When pt gets tired tends to taks short, quick hop steps with decreased safety, vc's to take a break and then move slower with more effective hops    Stairs             Wheelchair Mobility    Modified Rankin (Stroke Patients Only)       Balance Overall balance assessment: Needs assistance Sitting-balance support: Feet supported;No upper extremity supported Sitting balance-[name]: Good     Standing balance support: During functional activity Standing balance-[name]: Poor Standing balance comment: Requires UE support and external support at times.                            Cognition Arousal/Alertness: Awake/alert Behavior During Therapy: WFL for tasks assessed/performed Overall Cognitive Status: Impaired/Different from baseline Area of Impairment: Memory                     Memory: Decreased short-term memory                Exercises General Exercises - Lower Extremity Quad Sets: Both;10 reps;Seated Long Arc Quad: Both;10 reps;Seated Hip ABduction/ADduction: AROM;Right;Seated;10 reps Hip Flexion/Marching: Both;10 reps;Seated    General Comments        Pertinent Vitals/Pain Pain Assessment: No/denies pain    Home Living  Prior Function            PT Goals (current goals can now be found in the care plan section) Acute Rehab PT Goals Patient Stated Goal: to get to rehab and return to PLOF PT Goal Formulation: With patient/family Time For Goal Achievement: 08/28/19 Potential to Achieve Goals: Good Progress towards PT goals: Progressing toward goals    Frequency    Min 3X/week      PT Plan Current plan remains appropriate    Co-evaluation              AM-PAC PT "6 Clicks" Mobility   Outcome Measure  Help needed turning from  your back to your side while in a flat bed without using bedrails?: None Help needed moving from lying on your back to sitting on the side of a flat bed without using bedrails?: A Little Help needed moving to and from a bed to a chair (including a wheelchair)?: A Little Help needed standing up from a chair using your arms (e.g., wheelchair or bedside chair)?: A Lot Help needed to walk in hospital room?: A Little Help needed climbing 3-5 steps with a railing? : Total 6 Click Score: 16    End of Session Equipment Utilized During Treatment: Gait belt Activity Tolerance: Patient tolerated treatment well Patient left: with call bell/phone within reach;in bed;with bed alarm set Nurse Communication: Mobility status PT Visit Diagnosis: Unsteadiness on feet (R26.81);Difficulty in walking, not elsewhere classified (R26.2);Pain Pain - Right/Left: Right Pain - part of body: Leg     Time: 0762-2633 PT Time Calculation (min) (ACUTE ONLY): 15 min  Charges:  $Gait Training: 8-22 mins                     [place]  Pager [phone] Office [place] 08/22/2019, 12:48 PM

## 2019-08-22 NOTE — Plan of Care (Signed)
  Problem: Education: Goal: Knowledge of General Education information will improve Description: Including pain rating scale, medication(s)/side effects and non-pharmacologic comfort measures Outcome: Completed/Met   Problem: Health Behavior/Discharge Planning: Goal: Ability to manage health-related needs will improve Outcome: Completed/Met   Problem: Clinical Measurements: Goal: Ability to maintain clinical measurements within normal limits will improve Outcome: Completed/Met Goal: Will remain free from infection Outcome: Completed/Met Goal: Diagnostic test results will improve Outcome: Completed/Met Goal: Respiratory complications will improve Outcome: Completed/Met Goal: Cardiovascular complication will be avoided Outcome: Completed/Met   Problem: Activity: Goal: Risk for activity intolerance will decrease Outcome: Completed/Met   Problem: Nutrition: Goal: Adequate nutrition will be maintained Outcome: Completed/Met   Problem: Coping: Goal: Level of anxiety will decrease Outcome: Completed/Met   Problem: Elimination: Goal: Will not experience complications related to bowel motility Outcome: Completed/Met   Problem: Pain Managment: Goal: General experience of comfort will improve Outcome: Completed/Met   Problem: Safety: Goal: Ability to remain free from injury will improve Outcome: Completed/Met   Problem: Skin Integrity: Goal: Risk for impaired skin integrity will decrease Outcome: Completed/Met

## 2019-08-22 NOTE — Discharge Summary (Signed)
Physician Discharge Summary  Prather Failla Reinhardt EZM:[id number] DOB: 1925-09-28 DOA: 08/12/2019  PCP: [name]  Admit date: 08/12/2019 Discharge date: 08/22/2019  Time spent:30 minutes  Recommendations for Outpatient Follow-up:   RIGHT ankle fracture -S/p mechanical fall -Dr. Griffin Basil requested transfer to [facility] for definitive management -7/17 s/p ORIF  DM type II uncontrolled with complication -5/03 hemoglobin A1c= 7.7 -Lantus 20 units BID -7/24 increase NovoLog 15 units qac  DM neuropathy -7/23 increase Gabapentin 200 mg qhs  Essential HTN -Controlled  CKD stage IV (baseline Cr 1.94) -Hold all nephrotoxic medication -Monitor closely Recent Labs  Lab 08/18/19 0139 08/19/19 0342 08/20/19 0337 08/21/19 0234 08/22/19 0456  CREATININE 2.09* 2.18* 1.94* 1.96* 1.85*  -Better than baseline  Hx CVA -Does not take antiplatelet medication -Negative residual deficits -7/22 PT consult; work with patient daily ROM, strengthening   Discharge Diagnoses:  Principal Problem:   Ankle fracture Active Problems:   CKD (chronic kidney disease), stage IV (HCC)   DM (diabetes mellitus), type 2 with renal complications ([place])   History of stroke   Essential hypertension   Closed right ankle fracture   DM neuropathy, painful ([place])   Discharge Condition: Stable  Diet recommendation: Renal/carb modified  Filed Weights   08/20/19 0500 08/21/19 0614 08/22/19 0500  Weight: (!) 100.7 kg (!) 105.4 kg (!) 100.9 kg    History of present illness:  86 y.o.WM PMHx CVA, DM type II uncontrolled with complication, CKD stage IV, Anemia, HTN,   Had a fall at home when he was walking to the bathroom. Patient states he tripped on a rug and fell onto the floor. Did not hit his head or lose consciousness. Denies any chest pain or shortness of breath. Patient just recently moved from [place].  ED Course:In the ER x-rays revealed right ankle fracture.  On-call orthopedic surgeon Dr. Griffin Basil was consulted who requested transfer to [facility] for likely surgical management. Labs are remarkable for creatinine of 1.9 blood glucose 200 hemoglobin 12.5 WBC 9.2  Hospital Course:  See above   Procedures: 7/17 RIGHT ankle OPEN REDUCTION INTERNAL FIXATION (ORIF) ANKLE FRACTURE   Consultations: Orthopedic surgery    Discharge Exam: Vitals:   08/21/19 1955 08/22/19 0318 08/22/19 0500 08/22/19 0749  BP: (!) 148/67 (!) 135/66  (!) 134/68  Pulse: 77 73  75  Resp: 16 16  17   Temp: 98.3 F (36.8 C) 98.6 F (37 C)  98.6 F (37 C)  TempSrc: Oral Oral  Oral  SpO2: 98% 93%  94%  Weight:   (!) 100.9 kg   Height:        General: A/O x4, No acute respiratory distress Eyes: negative scleral hemorrhage, negative anisocoria, negative icterus ENT: Negative Runny nose, negative gingival bleeding, Neck:  Negative scars, masses, torticollis, lymphadenopathy, JVD Lungs: Clear to auscultation bilaterally without wheezes or crackles Cardiovascular: Regular rate and rhythm without murmur gallop or rub normal S1 and S2   Discharge Instructions   Allergies as of 08/22/2019      Reactions   Adhesive [tape] Rash      Medication List    STOP taking these medications   amLODipine 5 MG tablet Commonly known as: NORVASC   aspirin 325 MG EC tablet   glimepiride 4 MG tablet Commonly known as: AMARYL     TAKE these medications   acetaminophen 325 MG tablet Commonly known as: TYLENOL Take 650 mg by mouth every 4 (four) hours as needed for mild pain or  headache.   atorvastatin 80 MG tablet Commonly known as: LIPITOR Take 80 mg by mouth daily.   betamethasone valerate 0.1 % cream Commonly known as: VALISONE Apply 1 application topically 3 (three) times a week. psoriasis   docusate sodium 100 MG capsule Commonly known as: COLACE Take 100 mg by mouth 2 (two) times daily.   enoxaparin 30 MG/0.3ML injection Commonly known as:  LOVENOX Inject 0.3 mLs (30 mg total) into the skin daily.   famotidine 20 MG tablet Commonly known as: PEPCID Take 20 mg by mouth at bedtime.   gabapentin 100 MG capsule Commonly known as: NEURONTIN Take 2 capsules (200 mg total) by mouth at bedtime.   insulin aspart 100 UNIT/ML FlexPen Commonly known as: NOVOLOG Inject 15 Units into the skin 3 (three) times daily with meals.   insulin glargine 100 UNIT/ML injection Commonly known as: LANTUS Inject 0.2 mLs (20 Units total) into the skin 2 (two) times daily. What changed: how much to take   Insulin Pen Needle 29G X 12MM Misc Use as directed   levothyroxine 125 MCG tablet Commonly known as: SYNTHROID Take 125 mcg by mouth daily before breakfast.   polyethylene glycol 17 g packet Commonly known as: MIRALAX / GLYCOLAX Take 17 g by mouth 2 (two) times daily.   senna 8.6 MG Tabs tablet Commonly known as: SENOKOT Take 1 tablet (8.6 mg total) by mouth daily.   sorbitol 70 % Soln Take 30 mLs by mouth daily as needed for moderate constipation.   timolol 0.5 % ophthalmic solution Commonly known as: BETIMOL Place 1 drop into both eyes 2 (two) times daily.   Vitamin D2 50 MCG (2000 UT) Tabs Take 2,000 Units by mouth daily.      Allergies  Allergen Reactions  . Adhesive [Tape] Rash    Contact information for after-discharge care    Destination    [facility] .   Service: Skilled Nursing Contact information: [place] [phone]                   The results of significant diagnostics from this hospitalization (including imaging, microbiology, ancillary and laboratory) are listed below for reference.    Significant Diagnostic Studies: DG Ankle Complete Right  Result Date: 08/12/2019 CLINICAL DATA:  86 year old male status post fall with pain. EXAM: RIGHT ANKLE - COMPLETE 3+ VIEW COMPARISON:  None. FINDINGS: Oblique mildly comminuted fracture through  the distal right fibula metadiaphysis. Mild posterior and lateral displacement of the fibula. Widening of the right lateral mortise joint space, as well as the tibia fibular syndesmosis. Superimposed more linear fracture through the medial malleolus, nondisplaced. The posterior malleolus appears to remain intact. Talar dome intact. Calcaneus and other visible bones of the right foot appear intact. IMPRESSION: 1. Mildly displaced and comminuted fracture of the distal right fibula metadiaphysis with suspicion of injury to the tibiofibular syndesmosis. 2. Nondisplaced fracture through the medial malleolus. Electronically Signed   By: Genevie Ann M.D.   On: 08/12/2019 03:53   DG CHEST PORT 1 VIEW  Result Date: 08/12/2019 CLINICAL DATA:  Preop ankle surgery EXAM: PORTABLE CHEST 1 VIEW COMPARISON:  None. FINDINGS: Mild left basilar scarring/atelectasis. Lungs are otherwise clear. No pleural effusion or pneumothorax. Heart is normal in size. Surgical clips along the left neck. IMPRESSION: No evidence of acute cardiopulmonary disease. Electronically Signed   By: Julian Hy M.D.   On: 08/12/2019 07:37    Microbiology: Recent Results (from the  past 240 hour(s))  MRSA PCR Screening     Status: None   Collection Time: 08/13/19  5:50 AM   Specimen: Nasal Mucosa; Nasopharyngeal  Result Value Ref Range Status   MRSA by PCR NEGATIVE NEGATIVE Final    Comment:        The GeneXpert MRSA Assay (FDA approved for NASAL specimens only), is one component of a comprehensive MRSA colonization surveillance program. It is not intended to diagnose MRSA infection nor to guide or monitor treatment for MRSA infections. Performed at [facility], [place] [address] [place], [place] [address]   SARS CORONAVIRUS 2 (TAT 6-24 HRS) Nasopharyngeal Nasopharyngeal Swab     Status: None   Collection Time: 08/19/19 10:09 AM   Specimen: Nasopharyngeal Swab  Result Value Ref Range Status   SARS Coronavirus 2 NEGATIVE  NEGATIVE Final    Comment: (NOTE) SARS-CoV-2 target nucleic acids are NOT DETECTED.  The SARS-CoV-2 RNA is generally detectable in upper and lower respiratory specimens during the acute phase of infection. Negative results do not preclude SARS-CoV-2 infection, do not rule out co-infections with other pathogens, and should not be used as the sole basis for treatment or other patient management decisions. Negative results must be combined with clinical observations, patient history, and epidemiological information. The expected result is Negative.  Fact Sheet for Patients: SugarRoll.be  Fact Sheet for Healthcare Providers: https://www.-mathews.com/  This test is not yet approved or cleared by the [place] FDA and  has been authorized for detection and/or diagnosis of SARS-CoV-2 by FDA under an Emergency Use Authorization (EUA). This EUA will remain  in effect (meaning this test can be used) for the duration of the COVID-19 declaration under Se ction 564(b)(1) of the Act, 21 U.S.C. section 360bbb-3(b)(1), unless the authorization is terminated or revoked sooner.  Performed at [facility], [place] [address] [place], [place] [address]      Labs: Basic Metabolic Panel: Recent Labs  Lab 08/18/19 0139 08/19/19 0342 08/20/19 0337 08/21/19 0234 08/22/19 0456  NA 133* 136 136 136 138  K 4.4 4.3 4.4 4.6 4.3  CL 104 106 106 107 108  CO2 22 22 22 23 24   GLUCOSE 229* 228* 182* 249* 141*  BUN 60* 60* 55* 51* 41*  CREATININE 2.09* 2.18* 1.94* 1.96* 1.85*  CALCIUM 9.2 9.2 9.2 9.0 9.2  MG 2.1 2.2 2.2 2.0 2.1  PHOS 2.8 3.1 3.4 3.2 3.5   Liver Function Tests: Recent Labs  Lab 08/18/19 0139 08/19/19 0342 08/20/19 0337 08/21/19 0234 08/22/19 0456  AST 18 15 16 17  14*  ALT 11 14 17 14 16   ALKPHOS 51 46 48 51 56  BILITOT 0.8 0.2* 0.5 0.6 0.6  PROT 5.7* 5.4* 5.3* 5.0* 5.3*  ALBUMIN 3.0* 2.8* 2.7* 2.6* 2.7*   No results  for input(s): LIPASE, AMYLASE in the last 168 hours. No results for input(s): AMMONIA in the last 168 hours. CBC: Recent Labs  Lab 08/18/19 0139 08/19/19 0342 08/20/19 0337 08/21/19 0234 08/22/19 0456  WBC 6.6 5.8 6.2 5.5 6.2  NEUTROABS 2.9 2.1 2.5 2.1 2.6  HGB 10.8* 10.2* 10.3* 9.6* 10.5*  HCT 30.8* 29.4* 28.8* 27.9* 29.7*  MCV 88.3 88.6 88.9 89.4 88.9  PLT 219 221 238 244 266   Cardiac Enzymes: No results for input(s): CKTOTAL, CKMB, CKMBINDEX, TROPONINI in the last 168 hours. BNP: BNP (last 3 results) No results for input(s): BNP in the last 8760 hours.  ProBNP (last 3 results) No results for  input(s): PROBNP in the last 8760 hours.  CBG: Recent Labs  Lab 08/21/19 1559 08/21/19 1955 08/21/19 2353 08/22/19 0319 08/22/19 0746  GLUCAP 173* 234* 215* 145* 161*       Signed:  Dia Crawford, MD [facility] [phone] pager

## 2019-08-22 NOTE — Progress Notes (Signed)
Verbal report called to [place] at [place].  All questions answered and call back number provided.  Patient discharged via [facility] at 1235 on 08/22/19 with all belongings.

## 2019-08-22 NOTE — Progress Notes (Signed)
Occupational Therapy Treatment Patient Details Name: Martin Richardson MRN: [id number] DOB: 01-23-1926 Today's Date: 08/22/2019    History of present illness Patient is a 86 y/o male who presents with right ankle fx s/p fall. Now s/p ORIF right ankle 7/17. PMH includes stroke, HTN, DM, glaucoma, CKD, left eye blindness.   OT comments  Pt reports participating with nursing staff with sponge bathing and dressing seated on 3 in 1 at sink earlier today. Pt able to recall compensatory strategies for LB ADL adhering to NWB on R LE. Focus of session today on toileting and one grooming task in standing. Pt to discharge to SNF for further rehab later today. He is eager to get stronger and return home.   Follow Up Recommendations  SNF;Supervision/Assistance - 24 hour    Equipment Recommendations  Wheelchair (measurements OT);Wheelchair cushion (measurements OT)    Recommendations for Other Services      Precautions / Restrictions Precautions Precautions: Fall Precaution Comments: pt with neuropathy and vertigo Restrictions Weight Bearing Restrictions: Yes RLE Weight Bearing: Non weight bearing       Mobility Bed Mobility               General bed mobility comments: OOB in chair  Transfers Overall transfer level: Needs assistance Equipment used: Rolling walker (2 wheeled) Transfers: Sit to/from Stand Sit to Stand: Mod assist         General transfer comment: cues for sequencing and assist to rise and steady as he moved hands from chair to walker    Balance Overall balance assessment: Needs assistance   Sitting balance-[name]: Good     Standing balance support: During functional activity Standing balance-[name]: Poor Standing balance comment: Requires UE support and external support at times.                           ADL either performed or assessed with clinical judgement   ADL Overall ADL's : Needs assistance/impaired     Grooming:  Wash/dry hands;Standing;Min guard                   Toilet Transfer: Minimal assistance;Ambulation;RW;BSC   Toileting- Clothing Manipulation and Hygiene: Sitting/lateral lean;Minimal assistance Toileting - Clothing Manipulation Details (indicate cue type and reason): assisted to pull up shorts, pt performed pericare leaning side to side     Functional mobility during ADLs: Minimal assistance;Rolling walker       Vision       Perception     Praxis      Cognition Arousal/Alertness: Awake/alert Behavior During Therapy: WFL for tasks assessed/performed Overall Cognitive Status: Impaired/Different from baseline Area of Impairment: Memory                     Memory: Decreased short-term memory                  Exercises     Shoulder Instructions       General Comments      Pertinent Vitals/ Pain       Pain Assessment: No/denies pain  Home Living                                          Prior Functioning/Environment              Frequency  Min 2X/week  Progress Toward Goals  OT Goals(current goals can now be found in the care plan section)  Progress towards OT goals: Progressing toward goals  Acute Rehab OT Goals Patient Stated Goal: to get to rehab and return to PLOF OT Goal Formulation: With patient Time For Goal Achievement: 08/29/19 Potential to Achieve Goals: Good  Plan Discharge plan remains appropriate    Co-evaluation                 AM-PAC OT "6 Clicks" Daily Activity     Outcome Measure   Help from another person eating meals?: None Help from another person taking care of personal grooming?: A Little Help from another person toileting, which includes using toliet, bedpan, or urinal?: A Lot Help from another person bathing (including washing, rinsing, drying)?: A Lot Help from another person to put on and taking off regular upper body clothing?: None   6 Click Score: 15    End of  Session Equipment Utilized During Treatment: Gait belt;Rolling walker  OT Visit Diagnosis: Unsteadiness on feet (R26.81);Other abnormalities of gait and mobility (R26.89);Muscle weakness (generalized) (M62.81);History of falling (Z91.81)   Activity Tolerance Patient tolerated treatment well   Patient Left in chair;with call bell/phone within reach;with chair alarm set   Nurse Communication          Time: [phone] OT Time Calculation (min): 17 min  Charges: OT General Charges $OT Visit: 1 Visit OT Treatments $Self Care/Home Management : 8-22 mins  Nestor Lewandowsky, OTR/L Acute Rehabilitation Services Pager: [phone] Office: [phone]   Malka So 08/22/2019, 9:57 AM

## 2019-10-25 ENCOUNTER — Encounter: Payer: Self-pay | Admitting: Podiatry

## 2019-10-25 ENCOUNTER — Other Ambulatory Visit: Payer: Self-pay

## 2019-10-25 ENCOUNTER — Ambulatory Visit: Payer: Medicare Other | Admitting: Podiatry

## 2019-10-25 DIAGNOSIS — M79676 Pain in unspecified toe(s): Secondary | ICD-10-CM

## 2019-10-25 DIAGNOSIS — B351 Tinea unguium: Secondary | ICD-10-CM | POA: Diagnosis not present

## 2019-10-26 ENCOUNTER — Ambulatory Visit: Payer: Medicare Other | Admitting: Dietician

## 2019-10-26 NOTE — Progress Notes (Signed)
Subjective:  Patient ID: Martin Richardson, male    DOB: Apr 14, 1925,  MRN: [id number] HPI Chief Complaint  Patient presents with  . Diabetes    Diabetic - last a1c was 7.7 - requesting nail care, haven't been trimmed since early 2021, broken leg and was in a nursing home for rehab, just moved from [place] in May  . New Patient (Initial Visit)    86 y.o. male presents with the above complaint.   ROS: Denies fever chills nausea vomiting muscle aches pains calf pain back pain chest pain shortness of breath.  Past Medical History:  Diagnosis Date  . Arthritis   . Blind left eye   . Cancer of left kidney ([place])   . Cataract   . Chronic kidney disease   . Constipation   . Diabetes mellitus without complication ([place])   . Glaucoma   . Hypertension   . Neuropathy   . Stroke [facility])    Past Surgical History:  Procedure Laterality Date  . CHOLECYSTECTOMY    . ORIF ANKLE FRACTURE Right 08/13/2019   Procedure: OPEN REDUCTION INTERNAL FIXATION (ORIF) ANKLE FRACTURE;  Surgeon: Renette Butters, MD;  Location: [place];  Service: Orthopedics;  Laterality: Right;  . THYROID SURGERY    . TONSILLECTOMY      Current Outpatient Medications:  .  acetaminophen (TYLENOL) 325 MG tablet, Take 650 mg by mouth every 4 (four) hours as needed for mild pain or headache., Disp: , Rfl:  .  atorvastatin (LIPITOR) 80 MG tablet, Take 80 mg by mouth daily., Disp: , Rfl:  .  betamethasone valerate (VALISONE) 0.1 % cream, Apply 1 application topically 3 (three) times a week. psoriasis, Disp: , Rfl:  .  docusate sodium (COLACE) 100 MG capsule, Take 100 mg by mouth 2 (two) times daily., Disp: , Rfl:  .  enoxaparin (LOVENOX) 30 MG/0.3ML injection, Inject 0.3 mLs (30 mg total) into the skin daily., Disp: 0.3 mL, Rfl: 0 .  Ergocalciferol (VITAMIN D2) 50 MCG (2000 UT) TABS, Take 2,000 Units by mouth daily., Disp: , Rfl:  .  famotidine (PEPCID) 20 MG tablet, Take 20 mg by mouth at bedtime., Disp: , Rfl:  .  gabapentin  (NEURONTIN) 100 MG capsule, Take 2 capsules (200 mg total) by mouth at bedtime., Disp: 60 capsule, Rfl: 0 .  insulin aspart (NOVOLOG) 100 UNIT/ML FlexPen, Inject 15 Units into the skin 3 (three) times daily with meals., Disp: 15 mL, Rfl: 0 .  insulin glargine (LANTUS) 100 UNIT/ML injection, Inject 0.2 mLs (20 Units total) into the skin 2 (two) times daily., Disp: 10 mL, Rfl: 11 .  Insulin Pen Needle 29G X 12MM MISC, Use as directed, Disp: 200 each, Rfl: 0 .  levothyroxine (SYNTHROID) 125 MCG tablet, Take 125 mcg by mouth daily before breakfast., Disp: , Rfl:  .  polyethylene glycol (MIRALAX / GLYCOLAX) 17 g packet, Take 17 g by mouth 2 (two) times daily., Disp: , Rfl:  .  senna (SENOKOT) 8.6 MG TABS tablet, Take 1 tablet (8.6 mg total) by mouth daily., Disp: 120 tablet, Rfl: 0 .  sorbitol 70 % SOLN, Take 30 mLs by mouth daily as needed for moderate constipation., Disp: 30 mL, Rfl: 0 .  timolol (BETIMOL) 0.5 % ophthalmic solution, Place 1 drop into both eyes 2 (two) times daily., Disp: , Rfl:   Allergies  Allergen Reactions  . Adhesive [Tape] Rash   Review of Systems Objective:  There were no vitals filed for this visit.  General:  Well developed, nourished, in no acute distress, alert and oriented x3   Dermatological: Skin is warm, dry and supple bilateral. Nails x 10 are thick yellow dystrophic-like mycotic sharply incurvated and painful on palpation; remaining integument appears unremarkable at this time. There are no open sores, no preulcerative lesions, no rash or signs of infection present.  Vascular: Dorsalis Pedis artery and Posterior Tibial artery pedal pulses are 2/4 bilateral with immedate capillary fill time. Pedal hair growth present. No varicosities and no lower extremity edema present bilateral.   Neruologic: Grossly intact via light touch bilateral. Vibratory intact via tuning fork bilateral. Protective threshold with Semmes Wienstein monofilament diminished to all pedal sites  bilateral. Patellar and Achilles deep tendon reflexes 2+ bilateral. No Babinski or clonus noted bilateral.   Musculoskeletal: No gross boney pedal deformities bilateral. No pain, crepitus, or limitation noted with foot and ankle range of motion bilateral. Muscular strength 5/5 in all groups tested bilateral.  Gait: Unassisted, Nonantalgic.    Radiographs:  None taken  Assessment & Plan:   Assessment: Pain in limb secondary to onychomycosis and diabetic peripheral neuropathy  Plan: Discussed etiology pathology conservative versus surgical therapies.  We left his gabapentin 100 mg stating that it does really well.  Debrided his toenails 1 through 5 bilateral.     Lachandra Dettmann T. [place], [place]

## 2019-12-14 ENCOUNTER — Observation Stay (HOSPITAL_COMMUNITY)
Admission: EM | Admit: 2019-12-14 | Discharge: 2019-12-17 | Disposition: A | Discharge status: Home / Self Care | Payer: Medicare Other | Attending: Emergency Medicine | Admitting: Emergency Medicine

## 2019-12-14 ENCOUNTER — Emergency Department (HOSPITAL_COMMUNITY): Payer: Medicare Other

## 2019-12-14 ENCOUNTER — Encounter (HOSPITAL_COMMUNITY): Payer: Self-pay | Admitting: *Deleted

## 2019-12-14 ENCOUNTER — Other Ambulatory Visit: Payer: Self-pay

## 2019-12-14 DIAGNOSIS — Z85528 Personal history of other malignant neoplasm of kidney: Secondary | ICD-10-CM | POA: Insufficient documentation

## 2019-12-14 DIAGNOSIS — R0789 Other chest pain: Secondary | ICD-10-CM | POA: Diagnosis present

## 2019-12-14 DIAGNOSIS — I200 Unstable angina: Principal | ICD-10-CM | POA: Insufficient documentation

## 2019-12-14 DIAGNOSIS — Z794 Long term (current) use of insulin: Secondary | ICD-10-CM | POA: Diagnosis not present

## 2019-12-14 DIAGNOSIS — Z20822 Contact with and (suspected) exposure to covid-19: Secondary | ICD-10-CM | POA: Diagnosis not present

## 2019-12-14 DIAGNOSIS — I129 Hypertensive chronic kidney disease with stage 1 through stage 4 chronic kidney disease, or unspecified chronic kidney disease: Secondary | ICD-10-CM | POA: Diagnosis not present

## 2019-12-14 DIAGNOSIS — Z79899 Other long term (current) drug therapy: Secondary | ICD-10-CM | POA: Insufficient documentation

## 2019-12-14 DIAGNOSIS — Z87891 Personal history of nicotine dependence: Secondary | ICD-10-CM | POA: Diagnosis not present

## 2019-12-14 DIAGNOSIS — R2689 Other abnormalities of gait and mobility: Secondary | ICD-10-CM | POA: Diagnosis not present

## 2019-12-14 DIAGNOSIS — R079 Chest pain, unspecified: Secondary | ICD-10-CM | POA: Diagnosis present

## 2019-12-14 DIAGNOSIS — Z23 Encounter for immunization: Secondary | ICD-10-CM | POA: Diagnosis not present

## 2019-12-14 DIAGNOSIS — E1122 Type 2 diabetes mellitus with diabetic chronic kidney disease: Secondary | ICD-10-CM | POA: Diagnosis not present

## 2019-12-14 DIAGNOSIS — N1832 Chronic kidney disease, stage 3b: Secondary | ICD-10-CM | POA: Insufficient documentation

## 2019-12-14 LAB — CBC
HCT: 37.6 % — ABNORMAL LOW (ref 39.0–52.0)
Hemoglobin: 12.9 g/dL — ABNORMAL LOW (ref 13.0–17.0)
MCH: 29.9 pg (ref 26.0–34.0)
MCHC: 34.3 g/dL (ref 30.0–36.0)
MCV: 87 fL (ref 80.0–100.0)
Platelets: 185 10*3/uL (ref 150–400)
RBC: 4.32 MIL/uL (ref 4.22–5.81)
RDW: 13 % (ref 11.5–15.5)
WBC: 6.3 10*3/uL (ref 4.0–10.5)
nRBC: 0 % (ref 0.0–0.2)

## 2019-12-14 LAB — BASIC METABOLIC PANEL
Anion gap: 9 (ref 5–15)
BUN: 30 mg/dL — ABNORMAL HIGH (ref 8–23)
CO2: 19 mmol/L — ABNORMAL LOW (ref 22–32)
Calcium: 9.6 mg/dL (ref 8.9–10.3)
Chloride: 108 mmol/L (ref 98–111)
Creatinine, Ser: 1.97 mg/dL — ABNORMAL HIGH (ref 0.61–1.24)
GFR, Estimated: 31 mL/min — ABNORMAL LOW (ref >60)
Glucose, Bld: 278 mg/dL — ABNORMAL HIGH (ref 70–99)
Potassium: 4.2 mmol/L (ref 3.5–5.1)
Sodium: 136 mmol/L (ref 135–145)

## 2019-12-14 LAB — RESPIRATORY PANEL BY RT PCR (FLU A&B, COVID)
Influenza A by PCR: NEGATIVE
Influenza B by PCR: NEGATIVE
SARS Coronavirus 2 by RT PCR: NEGATIVE

## 2019-12-14 LAB — TROPONIN I (HIGH SENSITIVITY): Troponin I (High Sensitivity): 25 ng/L — ABNORMAL HIGH (ref <18)

## 2019-12-14 IMAGING — CR DG CHEST 1V PORT
1 series · 1 of 1 positions shown · non-contrast
Comparison: Radiographs [DATE]

CLINICAL DATA: Chest pain and shortness of breath.  COVID exposure.

EXAM:
PORTABLE CHEST 1 VIEW

[AP]
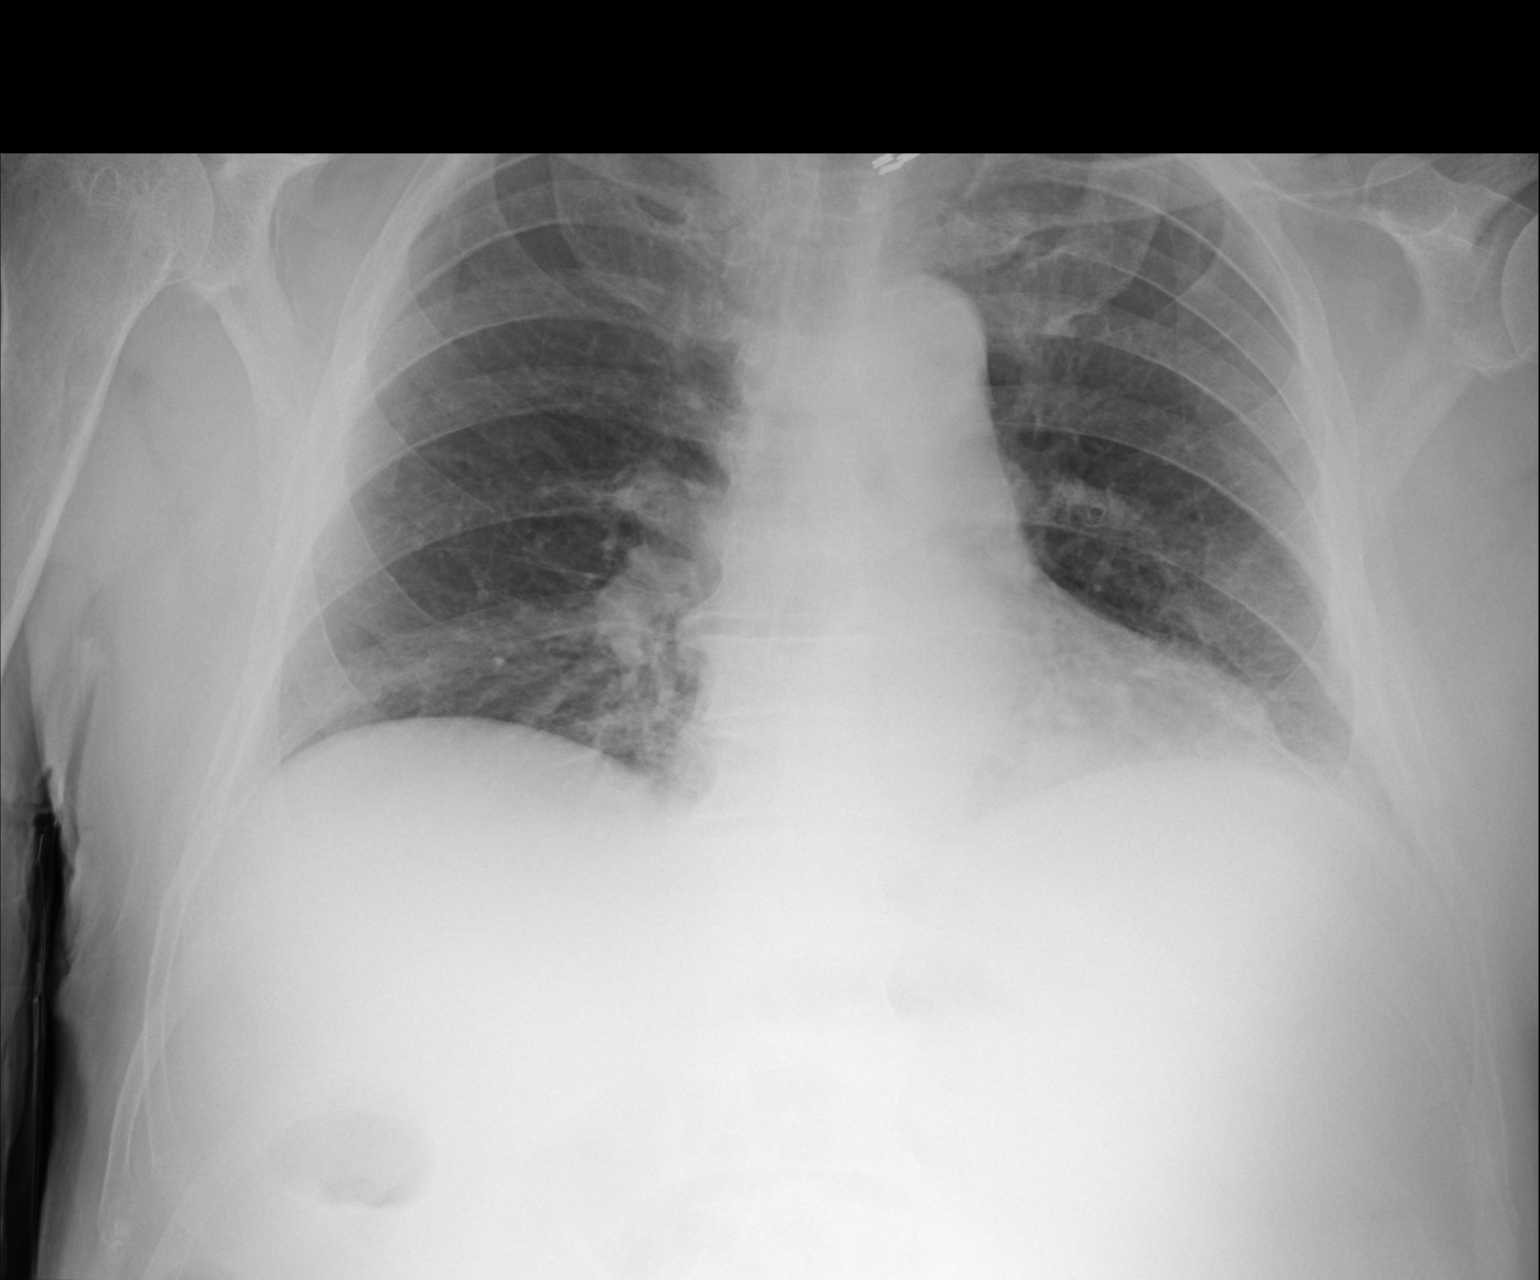

[1 of 1 positions shown; findings below may reference images not displayed]

FINDINGS: [5C] hours. The heart size and mediastinal contours are stable.
Patchy bibasilar opacities are most consistent with atelectasis.
There is no confluent airspace opacity, pleural effusion or
pneumothorax. Surgical clips are noted at the thoracic inlet on the
left. No acute osseous findings.
IMPRESSION: Probable bibasilar atelectasis. No consolidation to suggest
pneumonia.

## 2019-12-14 NOTE — ED Triage Notes (Signed)
Pt says that he "feels funny" in his central chest area, some shortness of breath. Reports BP was elevated when he went to his doctor yesterday. 1 day of symptoms. Pt daughter is COVID +

## 2019-12-15 ENCOUNTER — Other Ambulatory Visit (HOSPITAL_COMMUNITY): Payer: Medicare Other

## 2019-12-15 ENCOUNTER — Observation Stay (HOSPITAL_BASED_OUTPATIENT_CLINIC_OR_DEPARTMENT_OTHER): Payer: Medicare Other

## 2019-12-15 DIAGNOSIS — I214 Non-ST elevation (NSTEMI) myocardial infarction: Secondary | ICD-10-CM | POA: Diagnosis not present

## 2019-12-15 DIAGNOSIS — R079 Chest pain, unspecified: Secondary | ICD-10-CM

## 2019-12-15 DIAGNOSIS — N1832 Chronic kidney disease, stage 3b: Secondary | ICD-10-CM

## 2019-12-15 LAB — COMPREHENSIVE METABOLIC PANEL
ALT: 14 U/L (ref 0–44)
AST: 13 U/L — ABNORMAL LOW (ref 15–41)
Albumin: 3.3 g/dL — ABNORMAL LOW (ref 3.5–5.0)
Alkaline Phosphatase: 60 U/L (ref 38–126)
Anion gap: 7 (ref 5–15)
BUN: 28 mg/dL — ABNORMAL HIGH (ref 8–23)
CO2: 23 mmol/L (ref 22–32)
Calcium: 9.5 mg/dL (ref 8.9–10.3)
Chloride: 110 mmol/L (ref 98–111)
Creatinine, Ser: 1.91 mg/dL — ABNORMAL HIGH (ref 0.61–1.24)
GFR, Estimated: 32 mL/min — ABNORMAL LOW (ref >60)
Glucose, Bld: 153 mg/dL — ABNORMAL HIGH (ref 70–99)
Potassium: 4.2 mmol/L (ref 3.5–5.1)
Sodium: 140 mmol/L (ref 135–145)
Total Bilirubin: 0.6 mg/dL (ref 0.3–1.2)
Total Protein: 5.5 g/dL — ABNORMAL LOW (ref 6.5–8.1)

## 2019-12-15 LAB — CBG MONITORING, ED: Glucose-Capillary: 138 mg/dL — ABNORMAL HIGH (ref 70–99)

## 2019-12-15 LAB — ECHOCARDIOGRAM COMPLETE
AR max vel: 3.69 cm2
AV Area VTI: 3.95 cm2
AV Area mean vel: 3.56 cm2
AV Mean grad: 3 mmHg
AV Peak grad: 4.3 mmHg
Ao pk vel: 1.04 m/s
Area-P 1/2: 2.83 cm2
Calc EF: 60.2 %
Height: 71 in
S' Lateral: 3.2 cm
Single Plane A2C EF: 57.4 %
Single Plane A4C EF: 64.7 %
Weight: 3552 oz

## 2019-12-15 LAB — CBC WITH DIFFERENTIAL/PLATELET
Abs Immature Granulocytes: 0.05 10*3/uL (ref 0.00–0.07)
Basophils Absolute: 0.1 10*3/uL (ref 0.0–0.1)
Basophils Relative: 1 %
Eosinophils Absolute: 0.9 10*3/uL — ABNORMAL HIGH (ref 0.0–0.5)
Eosinophils Relative: 17 %
HCT: 36 % — ABNORMAL LOW (ref 39.0–52.0)
Hemoglobin: 12.4 g/dL — ABNORMAL LOW (ref 13.0–17.0)
Immature Granulocytes: 1 %
Lymphocytes Relative: 29 %
Lymphs Abs: 1.5 10*3/uL (ref 0.7–4.0)
MCH: 30.6 pg (ref 26.0–34.0)
MCHC: 34.4 g/dL (ref 30.0–36.0)
MCV: 88.9 fL (ref 80.0–100.0)
Monocytes Absolute: 0.7 10*3/uL (ref 0.1–1.0)
Monocytes Relative: 13 %
Neutro Abs: 2 10*3/uL (ref 1.7–7.7)
Neutrophils Relative %: 39 %
Platelets: 166 10*3/uL (ref 150–400)
RBC: 4.05 MIL/uL — ABNORMAL LOW (ref 4.22–5.81)
RDW: 13 % (ref 11.5–15.5)
WBC: 5.2 10*3/uL (ref 4.0–10.5)
nRBC: 0 % (ref 0.0–0.2)

## 2019-12-15 LAB — HEMOGLOBIN A1C
Hgb A1c MFr Bld: 7.3 % — ABNORMAL HIGH (ref 4.8–5.6)
Mean Plasma Glucose: 162.81 mg/dL

## 2019-12-15 LAB — GLUCOSE, CAPILLARY
Glucose-Capillary: 141 mg/dL — ABNORMAL HIGH (ref 70–99)
Glucose-Capillary: 204 mg/dL — ABNORMAL HIGH (ref 70–99)
Glucose-Capillary: 155 mg/dL — ABNORMAL HIGH (ref 70–99)

## 2019-12-15 LAB — TROPONIN I (HIGH SENSITIVITY)
Troponin I (High Sensitivity): 112 ng/L (ref <18)
Troponin I (High Sensitivity): 158 ng/L (ref <18)
Troponin I (High Sensitivity): 33 ng/L — ABNORMAL HIGH (ref <18)

## 2019-12-15 LAB — MAGNESIUM: Magnesium: 1.9 mg/dL (ref 1.7–2.4)

## 2019-12-15 LAB — TSH: TSH: 1.45 u[IU]/mL (ref 0.350–4.500)

## 2019-12-15 LAB — BRAIN NATRIURETIC PEPTIDE: B Natriuretic Peptide: 83.1 pg/mL (ref 0.0–100.0)

## 2019-12-15 MED ORDER — ASPIRIN 81 MG PO CHEW
324.0000 mg | CHEWABLE_TABLET | Freq: Once | ORAL | Status: AC
  Administered 2019-12-15: 324 mg via ORAL
  Filled 2019-12-15: qty 4

## 2019-12-15 MED ORDER — PERFLUTREN LIPID MICROSPHERE
1.0000 mL | INTRAVENOUS | Status: AC | PRN
  Administered 2019-12-15: 5 mL via INTRAVENOUS
  Filled 2019-12-15: qty 10

## 2019-12-15 MED ORDER — LACTATED RINGERS IV SOLN: INTRAVENOUS | Status: DC

## 2019-12-15 MED ORDER — FAMOTIDINE 20 MG PO TABS
20.0000 mg | ORAL_TABLET | Freq: Every day | ORAL | Status: DC
  Administered 2019-12-15 – 2019-12-16 (×2): 20 mg via ORAL
  Filled 2019-12-15 (×2): qty 1

## 2019-12-15 MED ORDER — ATORVASTATIN CALCIUM 80 MG PO TABS
80.0000 mg | ORAL_TABLET | Freq: Every day | ORAL | Status: DC
  Administered 2019-12-15 – 2019-12-17 (×3): 80 mg via ORAL
  Filled 2019-12-15 (×3): qty 1

## 2019-12-15 MED ORDER — SODIUM CHLORIDE 0.9% FLUSH: 3.0000 mL | INTRAVENOUS | Status: DC | PRN

## 2019-12-15 MED ORDER — HEPARIN SODIUM (PORCINE) 5000 UNIT/ML IJ SOLN
5000.0000 [IU] | Freq: Three times a day (TID) | INTRAMUSCULAR | Status: DC
  Administered 2019-12-15 – 2019-12-16 (×3): 5000 [IU] via SUBCUTANEOUS
  Filled 2019-12-15 (×3): qty 1

## 2019-12-15 MED ORDER — INFLUENZA VAC A&B SA ADJ QUAD 0.5 ML IM PRSY
0.5000 mL | PREFILLED_SYRINGE | INTRAMUSCULAR | Status: AC
  Administered 2019-12-17: 0.5 mL via INTRAMUSCULAR
  Filled 2019-12-15: qty 0.5

## 2019-12-15 MED ORDER — LEVOTHYROXINE SODIUM 25 MCG PO TABS
125.0000 ug | ORAL_TABLET | Freq: Every day | ORAL | Status: DC
  Administered 2019-12-15 – 2019-12-17 (×3): 125 ug via ORAL
  Filled 2019-12-15 (×3): qty 1

## 2019-12-15 MED ORDER — SODIUM CHLORIDE 0.9% FLUSH
3.0000 mL | Freq: Two times a day (BID) | INTRAVENOUS | Status: DC
  Administered 2019-12-16: 3 mL via INTRAVENOUS

## 2019-12-15 MED ORDER — INSULIN ASPART 100 UNIT/ML ~~LOC~~ SOLN
0.0000 [IU] | SUBCUTANEOUS | Status: DC
  Administered 2019-12-15 (×2): 1 [IU] via SUBCUTANEOUS

## 2019-12-15 MED ORDER — VITAMIN D 25 MCG (1000 UNIT) PO TABS
2000.0000 [IU] | ORAL_TABLET | Freq: Every day | ORAL | Status: DC
  Administered 2019-12-15 – 2019-12-17 (×3): 2000 [IU] via ORAL
  Filled 2019-12-15 (×3): qty 2

## 2019-12-15 MED ORDER — TIMOLOL MALEATE 0.5 % OP SOLN
1.0000 [drp] | Freq: Two times a day (BID) | OPHTHALMIC | Status: DC
  Administered 2019-12-15 – 2019-12-16 (×3): 1 [drp] via OPHTHALMIC
  Filled 2019-12-15 (×2): qty 5

## 2019-12-15 MED ORDER — GABAPENTIN 100 MG PO CAPS
200.0000 mg | ORAL_CAPSULE | Freq: Every day | ORAL | Status: DC
  Administered 2019-12-15 – 2019-12-16 (×2): 200 mg via ORAL
  Filled 2019-12-15 (×2): qty 2

## 2019-12-15 MED ORDER — ASPIRIN 81 MG PO CHEW
81.0000 mg | CHEWABLE_TABLET | Freq: Every day | ORAL | Status: DC
  Administered 2019-12-16 – 2019-12-17 (×2): 81 mg via ORAL
  Filled 2019-12-15 (×2): qty 1

## 2019-12-15 MED ORDER — INSULIN ASPART 100 UNIT/ML ~~LOC~~ SOLN
0.0000 [IU] | Freq: Three times a day (TID) | SUBCUTANEOUS | Status: DC
  Administered 2019-12-15: 3 [IU] via SUBCUTANEOUS
  Administered 2019-12-16: 2 [IU] via SUBCUTANEOUS
  Administered 2019-12-16 – 2019-12-17 (×2): 3 [IU] via SUBCUTANEOUS

## 2019-12-15 MED ORDER — AMLODIPINE BESYLATE 5 MG PO TABS
5.0000 mg | ORAL_TABLET | Freq: Every day | ORAL | Status: DC
  Administered 2019-12-15 – 2019-12-17 (×3): 5 mg via ORAL
  Filled 2019-12-15 (×3): qty 1

## 2019-12-15 MED ORDER — SODIUM CHLORIDE 0.9 % IV SOLN: INTRAVENOUS | Status: DC

## 2019-12-15 MED ORDER — SODIUM CHLORIDE 0.9 % IV SOLN: 250.0000 mL | INTRAVENOUS | Status: DC | PRN

## 2019-12-15 NOTE — ED Notes (Signed)
Pt continues to deny any chest pain, SOB or nausea

## 2019-12-15 NOTE — Consult Note (Addendum)
The patient has been seen in conjunction with Sande Rives, PA-C. All aspects of care have been considered and discussed. The patient has been personally interviewed, examined, and all clinical data has been reviewed.   86 year old gentleman who is physically active and appears younger than stated age.  He walks on the treadmill and uses a stationary bicycle several times per week.  Had prolonged chest discomfort lasting greater than an hour yesterday.  Discomfort was precordial and radiated into arms, associated with diaphoresis.  He gradually resolved.  Current data demonstrates a bump in troponin I.  He has stage III CKD.  EKG demonstrates old inferior infarct.  Contemplated myocardial perfusion imaging however felt that with marked prolongation of PR interval none pharmacologic stress with not work.  States that he would not be able to stay on treadmill longer than a minute or 2.  Considering appearance of EKG and marked evidence of ischemia with compatible history, and chosen to go with limited contrast coronary angiography without left ventriculography.  Discussed the risks involved which will include acute kidney injury.  Benefits of cath would be definitive diagnosis to exclude high risk subset.  After discussion, we made a shared decision to proceed with coronary angiography.  The patient was counseled to undergo left heart catheterization, coronary angiography, and possible percutaneous coronary intervention with stent implantation. The procedural risks and benefits were discussed in detail. The risks discussed included death, stroke, myocardial infarction, life-threatening bleeding, limb ischemia, kidney injury, allergy, and possible emergency cardiac surgery. The risk of these significant complications were estimated to occur less than 1% of the time. After discussion, the patient has agreed to proceed.   Cardiology Consultation:   Patient ID: Martin Richardson MRN: [id number];  DOB: Jul 01, 1925  Admit date: 12/14/2019 Date of Consult: 12/15/2019  Primary Care Provider: [name] Richardson Cardiologist: New (Dr. [name]) [place] Electrophysiologist:  None    Patient Profile:   Martin Richardson is a 86 y.o. male with a history of CVA, hypertension, hyperlipidemia, type 2 diabetes mellitus, hypothyroidism, CKD stage III-IV, and anemia who is being seen today for the evaluation of chest pain at the request of Dr. [place] Richardson.  History of Present Illness:   [name] is a 86 year old male with the above history.  Patient has no known cardiac history. He states he has never seen a Film/video editor. He does report that his PCP In [place] before moving ordered a heart monitor for further evaluation of palpitations but he is not sure what this ended up showing.  No other prior cardiac work-up.  Remote smoking history but quit in 1983.  He denies any known street heart disease in his immediate family but states multiple maternal uncles had heart problems.  Patient presented to the ED yesterday for further evaluation of chest discomfort.  Patient states he was in his usual state of health until yesterday when he was walking to his car to deliver something to church and developed diffuse tightness across his chest with "aching" in bilateral shoulder and "heaviness" in bilateral arms as well as some associated shortness of breath.  No diaphoresis, nausea, vomiting, palpitations.  He does note mild dizziness at that time but no syncope. Symptoms lasted for over an hour. Patient states he did have a similar episode of chest pain with activity earlier this year. His daughter recently tested positive for Covid.  For patient decided to go to his PCPs office for Covid test. There  he was advised to come to ED for work-up given his age.  He denies any recent fevers or illnesses.  Does report sleeping about 30 to 45 degree angle but states he has done this for several  months this is not new.  He denies any PND.  He does note some mild swelling in his right leg since he fractured his ankle this summer.  No abnormal bleeding in urine or stools but he does note constipation.  In the ED, patient hypertensive but vitals normal. EKG showed normal sinus rhythm, rate 84 bpm, with 1st degree AV block with very prolonged PR interval, LVH, Q waves in inferior leads, and isolated T wave inversion in lead III. Q waves and T wave inversion new compared to last tracing in 07/2019. High-sensitivity troponin 25 >> 33. Chest x-ray showed probably bibasilar atelectasis but no consolidation to suggest pneumonia. BNP normal. WBC 6.3, Hgb 12.9, Plts 185. Na 136, 4.2, Cl 108, Glcos 278, BUN 30, Cr 1.97 (baseline 1.9 to 2.1). Respiratory panel negative for COVID and influenza.  Pain had resolved at time he got to the ED.  Patient was admitted for ACS rule out.  At the time of this evaluation, patient resting comfortably in bed chest pain-free. Patient states he recently moved here from [place] a few months ago. He is very functional and lives alone.   Past Medical History:  Diagnosis Date  . Arthritis   . Blind left eye   . Cancer of left kidney ([place])   . Cataract   . Chronic kidney disease   . Constipation   . Diabetes mellitus without complication ([place])   . Glaucoma   . Hypertension   . Neuropathy   . Stroke [facility])     Past Surgical History:  Procedure Laterality Date  . CHOLECYSTECTOMY    . ORIF ANKLE FRACTURE Right 08/13/2019   Procedure: OPEN REDUCTION INTERNAL FIXATION (ORIF) ANKLE FRACTURE;  Surgeon: Renette Butters, MD;  Location: [facility];  Service: Orthopedics;  Laterality: Right;  . THYROID SURGERY    . TONSILLECTOMY       Home Medications:  Prior to Admission medications   Medication Sig Start Date End Date Taking? Authorizing Provider  acetaminophen (TYLENOL) 325 MG tablet Take 650 mg by mouth every 4 (four) hours as needed for mild pain or headache.   Yes  [provider]  atorvastatin (LIPITOR) 80 MG tablet Take 80 mg by mouth daily.   Yes [provider]  betamethasone valerate (VALISONE) 0.1 % cream Apply 1 application topically every Monday, Wednesday, and Friday. For psoriasis   Yes [provider]  docusate sodium (COLACE) 100 MG capsule Take 100 mg by mouth 2 (two) times daily.   Yes [provider]  Ergocalciferol (VITAMIN D2 PO) Take 1,000 Units by mouth daily.    Yes [provider]  famotidine (PEPCID) 20 MG tablet Take 20 mg by mouth at bedtime.   Yes [provider]  gabapentin (NEURONTIN) 100 MG capsule Take 2 capsules (200 mg total) by mouth at bedtime. Patient taking differently: Take 100 mg by mouth at bedtime.  08/22/19  Yes Allie Bossier, MD  glimepiride (AMARYL) 4 MG tablet Take 4 mg by mouth daily with breakfast.   Yes [provider]  insulin glargine (LANTUS) 100 UNIT/ML injection Inject 0.2 mLs (20 Units total) into the skin 2 (two) times daily. Patient taking differently: Inject 25 Units into the skin daily.  08/16/19  Yes Regalado, [employer]  A, MD  levothyroxine (SYNTHROID) 125 MCG tablet Take 125 mcg by mouth daily before breakfast.   Yes [provider]  polyethylene glycol (MIRALAX / GLYCOLAX) 17 g packet Take 17 g by mouth 2 (two) times daily.   Yes [provider]  senna (SENOKOT) 8.6 MG TABS tablet Take 1 tablet (8.6 mg total) by mouth daily. Patient taking differently: Take 1 tablet by mouth at bedtime as needed for mild constipation.  08/17/19  Yes Regalado, Belkys A, MD  timolol (BETIMOL) 0.5 % ophthalmic solution Place 1 drop into both eyes 2 (two) times daily.   Yes [provider]  enoxaparin (LOVENOX) 30 MG/0.3ML injection Inject 0.3 mLs (30 mg total) into the skin daily. Patient not taking: Reported on 12/15/2019 08/16/19   Regalado, Jerald Kief A, MD  insulin aspart (NOVOLOG) 100 UNIT/ML FlexPen Inject 15 Units into the skin 3  (three) times daily with meals. Patient not taking: Reported on 12/15/2019 08/22/19   Allie Bossier, MD  Insulin Pen Needle 29G X 12MM MISC Use as directed 08/22/19   Allie Bossier, MD  sorbitol 70 % SOLN Take 30 mLs by mouth daily as needed for moderate constipation. Patient not taking: Reported on 12/15/2019 08/16/19   Niel Hummer A, MD    Inpatient Medications: Scheduled Meds: . amLODipine  5 mg Oral Daily  . [START ON 12/16/2019] aspirin  81 mg Oral Once  . atorvastatin  80 mg Oral Daily  . cholecalciferol  2,000 Units Oral Daily  . famotidine  20 mg Oral QHS  . gabapentin  200 mg Oral QHS  . heparin injection (subcutaneous)  5,000 Units Subcutaneous Q8H  . insulin aspart  0-9 Units Subcutaneous Q4H  . levothyroxine  125 mcg Oral QAC breakfast  . timolol  1 drop Both Eyes BID   Continuous Infusions: . lactated ringers 50 mL/hr at 12/15/19 0656   PRN Meds:   Allergies:    Allergies  Allergen Reactions  . Adhesive [Tape] Rash    Social History:   Social History   Socioeconomic History  . Marital status: Widowed    Spouse name: Not on file  . Number of children: Not on file  . Years of education: Not on file  . Highest education level: Not on file  Occupational History  . Not on file  Tobacco Use  . Smoking status: Former Research scientist (life sciences)  . Smokeless tobacco: Former Network engineer and Sexual Activity  . Alcohol use: Never  . Drug use: Never  . Sexual activity: Not on file  Other Topics Concern  . Not on file  Social History Narrative  . Not on file   Social Determinants of Health   Financial Resource Strain:   . Difficulty of Paying Living Expenses: Not on file  Food Insecurity:   . Worried About Charity fundraiser in the Last Year: Not on file  . Ran Out of Food in the Last Year: Not on file  Transportation Needs:   . Lack of Transportation (Medical): Not on file  . Lack of Transportation (Non-Medical): Not on file  Physical Activity:   . Days of  Exercise per Week: Not on file  . Minutes of Exercise per Session: Not on file  Stress:   . Feeling of Stress : Not on file  Social Connections:   . Frequency of Communication with Friends and Family: Not on file  . Frequency of Social Gatherings with Friends and Family: Not on file  . Attends  Religious Services: Not on file  . Active Member of Clubs or Organizations: Not on file  . Attends Archivist Meetings: Not on file  . Marital Status: Not on file  Intimate Partner Violence:   . Fear of Current or Ex-Partner: Not on file  . Emotionally Abused: Not on file  . Physically Abused: Not on file  . Sexually Abused: Not on file    Family History:    Family History  Problem Relation Age of Onset  . Diabetes Mellitus II Mother      ROS:  Please see the history of present illness.  Review of Systems  Constitutional: Negative for chills, diaphoresis and fever.  HENT: Negative for congestion.   Respiratory: Positive for sputum production. Negative for cough.   Cardiovascular: Positive for chest pain, palpitations and leg swelling (chronic right leg swelling since fracture). Negative for orthopnea and PND.  Gastrointestinal: Positive for constipation. Negative for blood in stool, melena, nausea and vomiting.  Genitourinary: Negative for hematuria.  Musculoskeletal: Positive for joint pain (bilateral should "aching").  Neurological: Positive for dizziness (mild). Negative for loss of consciousness.  Endo/Heme/Allergies: Does not bruise/bleed easily.  Psychiatric/Behavioral: Negative for substance abuse.   Physical Exam/Data:   Vitals:   12/15/19 0851 12/15/19 0945 12/15/19 1107 12/15/19 1117  BP: (!) 165/98 (!) 155/73  (!) 174/82  Pulse: (!) 51 (!) 57  (!) 53  Resp: 10 19  16   Temp:    98.2 F (36.8 C)  TempSrc:    Oral  SpO2: 99% 100%  100%  Weight:   100.7 kg   Height:   5\' 11"  (1.803 m)    No intake or output data in the 24 hours ending 12/15/19 1401 Last 3  Weights 12/15/2019 12/15/2019 12/14/2019  Weight (lbs) 222 lb 222 lb 222 lb  Weight (kg) 100.699 kg 100.699 kg 100.699 kg     Body mass index is 30.96 kg/m.  General: 86 y.o. male resting comfortably in no acute distress.  HEENT: Normocephalic and atraumatic. Sclera clear. EOMs i Neck: Supple. No carotid bruits. No JVD. Heart: RRR. Distinct S1 and S2. No murmurs, gallops, or rubs. Radial pulses 2+ and equal bilaterally. Lungs: No increased work of breathing. Clear to ausculation bilaterally. No wheezes, rhonchi, or rales.  Abdomen: Soft, non-distended, and mild tenderness in epigastric area. Bowel sounds present in all 4 quadrants.  MSK: Normal strength and tone for age. Extremities: Mild edema of right lower extremity (chronic since he fracture right ankle).   Skin: Warm and dry. Neuro: Alert and oriented x3. No focal deficits. Psych: Normal affect. Responds appropriately.   EKG:  The EKG was personally reviewed and demonstrates:  Normal sinus rhythm, rate 84 bpm, with significant 1st degree AV block, LVH, Q waves in inferior leads, and T wave inversion in lead III. Q waves and T wave inversion new compared to prior tracing in 07/2019.   Telemetry:  Telemetry was personally reviewed and demonstrates: Sinus bradycardia/ normal sinus rhythm with rates in the 50's to 60's with occasional PVC. One drop beat noted.  Relevant CV Studies:  Echocardiogram 12/15/2019: Impressions: 1. Left ventricular ejection fraction, by estimation, is 60 to 65%. The  left ventricle has normal function. The left ventricle has no regional  wall motion abnormalities. There is severe asymmetric left ventricular  hypertrophy of the basal-septal  segment. Left ventricular diastolic parameters are indeterminate.  2. Right ventricular systolic function is mildly reduced. The right  ventricular size is normal.  3.  Left atrial size was moderately dilated.  4. The mitral valve is normal in structure. No evidence  of mitral valve  regurgitation. No evidence of mitral stenosis.  5. The aortic valve was not well visualized. Aortic valve regurgitation  is trivial. Mild aortic valve sclerosis is present, with no evidence of  aortic valve stenosis.  6. Aortic dilatation noted. There is mild dilatation of the aortic root,  measuring 42 mm. There is dilatation of the ascending aorta, measuring 43  mm.   Laboratory Data:  High Sensitivity Troponin:   Recent Labs  Lab 12/14/19 2148 12/14/19 2352 12/15/19 0636 12/15/19 0837  TROPONINIHS 25* 33* 112* 158*     Chemistry Recent Labs  Lab 12/14/19 2148 12/15/19 0636  NA 136 140  K 4.2 4.2  CL 108 110  CO2 19* 23  GLUCOSE 278* 153*  BUN 30* 28*  CREATININE 1.97* 1.91*  CALCIUM 9.6 9.5  GFRNONAA 31* 32*  ANIONGAP 9 7    Recent Labs  Lab 12/15/19 0636  PROT 5.5*  ALBUMIN 3.3*  AST 13*  ALT 14  ALKPHOS 60  BILITOT 0.6   Hematology Recent Labs  Lab 12/14/19 2148 12/15/19 0636  WBC 6.3 5.2  RBC 4.32 4.05*  HGB 12.9* 12.4*  HCT 37.6* 36.0*  MCV 87.0 88.9  MCH 29.9 30.6  MCHC 34.3 34.4  RDW 13.0 13.0  PLT 185 166   BNP Recent Labs  Lab 12/15/19 0124  BNP 83.1    DDimer No results for input(s): DDIMER in the last 168 hours.   Radiology/Studies:  DG Chest Portable 1 View  Result Date: 12/14/2019 CLINICAL DATA:  Chest pain and shortness of breath.  COVID exposure. EXAM: PORTABLE CHEST 1 VIEW COMPARISON:  Radiographs 08/12/2019 FINDINGS: 2155 hours. The heart size and mediastinal contours are stable. Patchy bibasilar opacities are most consistent with atelectasis. There is no confluent airspace opacity, pleural effusion or pneumothorax. Surgical clips are noted at the thoracic inlet on the left. No acute osseous findings. IMPRESSION: Probable bibasilar atelectasis. No consolidation to suggest pneumonia. Electronically Signed   By: Richardean Sale M.D.   On: 12/14/2019 22:07   ECHOCARDIOGRAM COMPLETE  Result Date:  12/15/2019    ECHOCARDIOGRAM REPORT   Patient Name:   Martin Richardson Jain Date of Exam: 12/15/2019 Medical Rec #:  [id number]         Height:       71.0 in Accession #:    [id number]        Weight:       222.0 lb Date of Birth:  06/08/1925         BSA:          2.204 m Patient Age:    79 years          BP:           168/82 mmHg Patient Gender: M                 HR:           57 bpm. Exam Location:  Inpatient Procedure: 2D Echo, Cardiac Doppler, Color Doppler and Intracardiac            Opacification Agent Indications:    Elevated troponin  History:        Patient has no prior history of Echocardiogram examinations.                 Risk Factors:Diabetes and Hypertension. H/O CVA. CKD. DOE.  Sonographer:  Clayton Lefort RDCS (AE) Referring Phys: [id number] [facility]  Sonographer Comments: Suboptimal subcostal window and patient is morbidly obese. IMPRESSIONS  1. Left ventricular ejection fraction, by estimation, is 60 to 65%. The left ventricle has normal function. The left ventricle has no regional wall motion abnormalities. There is severe asymmetric left ventricular hypertrophy of the basal-septal segment. Left ventricular diastolic parameters are indeterminate.  2. Right ventricular systolic function is mildly reduced. The right ventricular size is normal.  3. Left atrial size was moderately dilated.  4. The mitral valve is normal in structure. No evidence of mitral valve regurgitation. No evidence of mitral stenosis.  5. The aortic valve was not well visualized. Aortic valve regurgitation is trivial. Mild aortic valve sclerosis is present, with no evidence of aortic valve stenosis.  6. Aortic dilatation noted. There is mild dilatation of the aortic root, measuring 42 mm. 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 FINDINGS  Left Ventricle: Left ventricular ejection fraction, by estimation, is 60 to 65%. The left ventricle has normal function. The left ventricle has no regional wall motion  abnormalities. Definity contrast agent was given IV to delineate the left ventricular  endocardial borders. The left ventricular internal cavity size was normal in size. There is severe asymmetric left ventricular hypertrophy of the basal-septal segment. Left ventricular diastolic parameters are indeterminate. Right Ventricle: The right ventricular size is normal. Right vetricular wall thickness was not assessed. Right ventricular systolic function is mildly reduced. Left Atrium: Left atrial size was moderately dilated. Right Atrium: Right atrial size was normal in size. Pericardium: There is no evidence of pericardial effusion. Mitral Valve: The mitral valve is normal in structure. No evidence of mitral valve regurgitation. No evidence of mitral valve stenosis. Tricuspid Valve: The tricuspid valve is normal in structure. Tricuspid valve regurgitation is trivial. Aortic Valve: The aortic valve was not well visualized. Aortic valve regurgitation is trivial. Mild aortic valve sclerosis is present, with no evidence of aortic valve stenosis. Aortic valve mean gradient measures 3.0 mmHg. Aortic valve peak gradient measures 4.3 mmHg. Aortic valve area, by VTI measures 3.95 cm. Pulmonic Valve: The pulmonic valve was not well visualized. Pulmonic valve regurgitation is trivial. Aorta: Aortic dilatation noted. There is mild dilatation of the aortic root, measuring 42 mm. 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 IAS/Shunts: The interatrial septum was not well visualized.  LEFT VENTRICLE PLAX 2D LVIDd:         4.50 cm      Diastology LVIDs:         3.20 cm      LV e' medial:    5.55 cm/s LV PW:         1.50 cm      LV E/e' medial:  14.2 LV IVS:        1.80 cm      LV e' lateral:   7.83 cm/s LVOT diam:     2.40 cm      LV E/e' lateral: 10.1 LV SV:         114 LV SV Index:   52 LVOT Area:     4.52 cm  LV Volumes (MOD) LV vol d, MOD A2C: 123.0 ml LV vol d, MOD A4C: 133.0 ml LV vol s, MOD A2C: 52.4 ml LV vol  s, MOD A4C: 46.9 ml LV SV MOD A2C:     70.6 ml LV SV MOD A4C:     133.0 ml LV SV MOD  BP:      77.3 ml RIGHT VENTRICLE RV Basal diam:  3.00 cm RV S prime:     8.81 cm/s TAPSE (M-mode): 2.3 cm LEFT ATRIUM             Index       RIGHT ATRIUM           Index LA diam:        3.80 cm 1.72 cm/m  RA Area:     17.50 cm LA Vol (A2C):   82.1 ml 37.25 ml/m RA Volume:   43.90 ml  19.92 ml/m LA Vol (A4C):   88.8 ml 40.29 ml/m LA Biplane Vol: 91.8 ml 41.65 ml/m  AORTIC VALVE AV Area (Vmax):    3.69 cm AV Area (Vmean):   3.56 cm AV Area (VTI):     3.95 cm AV Vmax:           104.00 cm/s AV Vmean:          78.200 cm/s AV VTI:            0.290 m AV Peak Grad:      4.3 mmHg AV Mean Grad:      3.0 mmHg LVOT Vmax:         84.90 cm/s LVOT Vmean:        61.600 cm/s LVOT VTI:          0.253 m LVOT/AV VTI ratio: 0.87  AORTA Ao Root diam: 4.20 cm Ao Asc diam:  4.30 cm MITRAL VALVE               TRICUSPID VALVE MV Area (PHT): 2.83 cm    TR Peak grad:   24.0 mmHg MV Decel Time: 268 msec    TR Vmax:        245.00 cm/s MV E velocity: 78.80 cm/s MV A velocity: 74.10 cm/s  SHUNTS MV E/A ratio:  1.06        Systemic VTI:  0.25 m                            Systemic Diam: 2.40 cm Oswaldo Milian MD Electronically signed by Oswaldo Milian MD Signature Date/Time: 12/15/2019/10:13:51 AM    Final      Assessment and Plan:   NSTEMI - Patient presented with chest tightness with associated arm pain and shortness of breath while walking. - EKG showed normal sinus rhythm with new Q waves in inferior leads and T wave inversions in lead III. - High-sensitivity troponin elevated at 25 >> 33 >> 112 >> 158.  - Echo showed LVEF of 60-65% with normal wall motion but severe asymmetric LVH of the basal-septal segment. RV normal with mildly reduced systolic function. Also noted mild dilation of aortic root measuring 42 mm and ascending aorta measuring 43 mm. - Currently chest pain free.  - Will start Aspirin 81mg  daily.  - Continue  home high-intensity statin.  - Will hold off on beta-blocker given low baseline bradycardia.  - Discussed ischemic options with Dr. [name]. Initially talked about [facility]; however, patient has significant 1st degree AV block with PR interval of 314 ms so concern for development of high grade AV block with Lexiscan. Given patient is very function, will plan for left cardiac catheterization tomorrow to make sure he does not have significant left main disease. Will need to minimize use of contrast given baseline creatinine of 1.9. Will start IV fluids tonight at  8pm.   The patient understands that risks include but are not limited to stroke (1 in 1000), death (1 in 30), kidney failure [usually temporary] (1 in 500), bleeding (1 in 200), allergic reaction [possibly serious] (1 in 200), and agrees to proceed.    Hypertension - History of hypertension listed in chart but not on any antihypertensives at home. - BP elevated here. - Will start Amlodipine 5mg  daily.  Hyperlipidemia - Continue home Lipitor 80mg  daily. - Will check fasting lipid panel tomorrow morning.  Type 2 Diabetes Mellitus  - Hemoglobin A1c 7.3. - Management per primary team.  CKD Stage III-IV - Creatine 1.97 on admission and 1.91 today. Baseline 1.9 to 2.1. - Continue to monitor closely.  Hypothyroidism - Continue Synthroid per primary team.  TIMI Risk Score for Unstable Angina or Non-ST Elevation MI:   The patient's TIMI risk score is 4, which indicates a 20% risk of all cause mortality, new or recurrent myocardial infarction or need for urgent revascularization in the next 14 days.   For questions or updates, please contact [place] Please consult [email or web address] for contact info under    Signed, Darreld Mclean, PA-C  12/15/2019 2:01 PM

## 2019-12-15 NOTE — Care Plan (Signed)
This 86 yrs old male with medical history significant for type 2 diabetes, hypertension, CVA, CKD stage IIIb presents in the ED with sudden onset of chest tightness while [place].Marland KitchenHe is admitted for chest pain rule out ACS.  EKG no evidence of Acute ischemia.  Troponins trending up.  Cardiology consulted,  advised to continue aspirin, statins.  Plan: [place] 11/19.  Patient seen and examined at bedside.  Patient denies any chest pain at this time.

## 2019-12-15 NOTE — ED Notes (Signed)
Rn informed of pt elevated Bp

## 2019-12-15 NOTE — Progress Notes (Signed)
  Echocardiogram 2D Echocardiogram has been performed.  Martin Richardson 12/15/2019, 9:31 AM

## 2019-12-15 NOTE — ED Notes (Signed)
Report called to [place], [place]

## 2019-12-15 NOTE — H&P (Signed)
History and Physical  Martin Richardson YBO:[id number] DOB: 02-Jun-1925 DOA: 12/14/2019  Referring physician:  Charlyne Quale, PA, [facility] PCP: Pa, [place]  Outpatient Specialists: None Patient coming from: Home.    Chief Complaint: Chest pain.  HPI: Martin Richardson is a 86 y.o. male with medical history significant for type 2 diabetes, essential hypertension, CVA, CKD 3B who presented to [facility] ED with sudden onset of chest tightness while sitting in his car at his church parking lot.  Onset later in the afternoon.  Initially started with discomfort in his shoulders bilaterally and about an hour later after he drove himself to church, started experiencing chest tightness.  Associated with dyspnea at rest.  States he has never felt anything like that before.  In the ED, heart score was 5.  First set of troponin mildly elevated peaked at 33, his chest pain had resolved at the time of this visit.  EDP asked to admit for chest pain rule out ACS.  ED Course: Elevated BP.  Lab studies remarkable for troponin S 21 and 33.  Creatinine 1.97 with GFR 31, close to baseline.  No evidence of acute ischemia on twelve-lead EKG.  Review of Systems: Review of systems as noted in the HPI. All other systems reviewed and are negative.   Past Medical History:  Diagnosis Date   Arthritis    Blind left eye    Cancer of left kidney ([place])    Cataract    Chronic kidney disease    Constipation    Diabetes mellitus without complication ([place])    Glaucoma    Hypertension    Neuropathy    Stroke [facility])    Past Surgical History:  Procedure Laterality Date   CHOLECYSTECTOMY     ORIF ANKLE FRACTURE Right 08/13/2019   Procedure: OPEN REDUCTION INTERNAL FIXATION (ORIF) ANKLE FRACTURE;  Surgeon: Renette Butters, MD;  Location: [place];  Service: Orthopedics;  Laterality: Right;   THYROID SURGERY     TONSILLECTOMY      Social History:  reports that he has quit smoking. He has quit  using smokeless tobacco. He reports that he does not drink alcohol and does not use drugs.   Allergies  Allergen Reactions   Adhesive [Tape] Rash    Family History  Problem Relation Age of Onset   Diabetes Mellitus II Mother       Prior to Admission medications   Medication Sig Start Date End Date Taking? Authorizing Provider  acetaminophen (TYLENOL) 325 MG tablet Take 650 mg by mouth every 4 (four) hours as needed for mild pain or headache.    [provider]  atorvastatin (LIPITOR) 80 MG tablet Take 80 mg by mouth daily.    [provider]  betamethasone valerate (VALISONE) 0.1 % cream Apply 1 application topically 3 (three) times a week. psoriasis    [provider]  docusate sodium (COLACE) 100 MG capsule Take 100 mg by mouth 2 (two) times daily.    [provider]  enoxaparin (LOVENOX) 30 MG/0.3ML injection Inject 0.3 mLs (30 mg total) into the skin daily. 08/16/19   Regalado, Belkys A, MD  Ergocalciferol (VITAMIN D2) 50 MCG (2000 UT) TABS Take 2,000 Units by mouth daily.    [provider]  famotidine (PEPCID) 20 MG tablet Take 20 mg by mouth at bedtime.    [provider]  gabapentin (NEURONTIN) 100 MG capsule Take 2 capsules (200 mg total) by mouth at bedtime. 08/22/19   Sherral Hammers,  Geraldo Docker, MD  insulin aspart (NOVOLOG) 100 UNIT/ML FlexPen Inject 15 Units into the skin 3 (three) times daily with meals. 08/22/19   Allie Bossier, MD  insulin glargine (LANTUS) 100 UNIT/ML injection Inject 0.2 mLs (20 Units total) into the skin 2 (two) times daily. 08/16/19   Regalado, Jerald Kief A, MD  Insulin Pen Needle 29G X 12MM MISC Use as directed 08/22/19   Allie Bossier, MD  levothyroxine (SYNTHROID) 125 MCG tablet Take 125 mcg by mouth daily before breakfast.    [provider]  polyethylene glycol (MIRALAX / GLYCOLAX) 17 g packet Take 17 g by mouth 2 (two) times daily.    [provider]  senna (SENOKOT) 8.6 MG TABS tablet  Take 1 tablet (8.6 mg total) by mouth daily. 08/17/19   Regalado, Belkys A, MD  sorbitol 70 % SOLN Take 30 mLs by mouth daily as needed for moderate constipation. 08/16/19   Regalado, Belkys A, MD  timolol (BETIMOL) 0.5 % ophthalmic solution Place 1 drop into both eyes 2 (two) times daily.    [provider]    Physical Exam: BP (!) 158/90    Pulse 60    Temp 98 F (36.7 C)    Resp 13    Ht 5\' 11"  (1.803 m)    Wt 100.7 kg    SpO2 99%    BMI 30.96 kg/m    General: 86 y.o. year-old male well developed well nourished in no acute distress.  Alert and oriented x3.  Cardiovascular: Regular rate and rhythm with no rubs or gallops.  No thyromegaly or JVD noted.  No lower extremity edema. 2/4 pulses in all 4 extremities.  Respiratory: Clear to auscultation with no wheezes or rales. Good inspiratory effort.  Abdomen: Soft nontender nondistended with normal bowel sounds x4 quadrants.  Muskuloskeletal: No cyanosis, clubbing or edema noted bilaterally  Neuro: CN II-XII intact, strength, sensation, reflexes  Skin: No ulcerative lesions noted or rashes  Psychiatry: Judgement and insight appear normal. Mood is appropriate for condition and setting          Labs on Admission:  Basic Metabolic Panel: Recent Labs  Lab 12/14/19 2148  NA 136  K 4.2  CL 108  CO2 19*  GLUCOSE 278*  BUN 30*  CREATININE 1.97*  CALCIUM 9.6   Liver Function Tests: No results for input(s): AST, ALT, ALKPHOS, BILITOT, PROT, ALBUMIN in the last 168 hours. No results for input(s): LIPASE, AMYLASE in the last 168 hours. No results for input(s): AMMONIA in the last 168 hours. CBC: Recent Labs  Lab 12/14/19 2148  WBC 6.3  HGB 12.9*  HCT 37.6*  MCV 87.0  PLT 185   Cardiac Enzymes: No results for input(s): CKTOTAL, CKMB, CKMBINDEX, TROPONINI in the last 168 hours.  BNP (last 3 results) Recent Labs    12/15/19 0124  BNP 83.1    ProBNP (last 3 results) No results for input(s): PROBNP in the last  8760 hours.  CBG: No results for input(s): GLUCAP in the last 168 hours.  Radiological Exams on Admission: DG Chest Portable 1 View  Result Date: 12/14/2019 CLINICAL DATA:  Chest pain and shortness of breath.  COVID exposure. EXAM: PORTABLE CHEST 1 VIEW COMPARISON:  Radiographs 08/12/2019 FINDINGS: 2155 hours. The heart size and mediastinal contours are stable. Patchy bibasilar opacities are most consistent with atelectasis. There is no confluent airspace opacity, pleural effusion or pneumothorax. Surgical clips are noted at the thoracic inlet on the left. No acute osseous  findings. IMPRESSION: Probable bibasilar atelectasis. No consolidation to suggest pneumonia. Electronically Signed   By: Richardean Sale M.D.   On: 12/14/2019 22:07    EKG: I independently viewed the EKG done and my findings are as followed: Sinus bradycardia rate of 56.  Nonspecific ST-T changes.  Assessment/Plan Present on Admission:  Chest pain  Active Problems:   Chest pain  Atypical chest pain rule out ACS Presented with sudden onset chest tightness while at rest, associated with nonexertional dyspnea. Chest pain resolved by time of this visit Initial troponin S 25 uptrending to 33 No evidence of acute ischemia on twelve-lead EKG Repeat troponin and obtain 2D echo. Monitor on telemetry Consult cardiology in the morning  Type 2 diabetes with hyperglycemia Obtain A1c Start insulin sliding scale Hold off home oral hypoglycemics  Sinus bradycardia Heart rate in the 72s Obtain TSH Continue to monitor  Hypothyroidism Obtain TSH Resume home levothyroxine  Essential hypertension BP is uncontrolled Resume home oral antihypertensive  History of prior CVA Continue home statin  CKD 3B Appears to be at baseline Avoid nephrotoxins Gentle IV fluid hydration normal saline at 50 cc/h x 2 days Monitor urine output Repeat renal panel in the morning   DVT prophylaxis: Subcu heparin 3 times daily  Code  Status: Full code as stable patient himself  Family Communication: None at bedside  Disposition Plan: Admit to telemetry cardiac  Consults called: None, consult cardiology in the morning  Admission status: Observation status   Status is: Observation    Dispo:  Patient From: Home  Planned Disposition: Home  Expected discharge date: 12/16/19  Medically stable for discharge: No, ongoing management of chest pain rule out ACS.   Kayleen Memos MD [facility] Pager [phone]  If 7PM-7AM, please contact night-coverage [email or web address] Password [facility]  12/15/2019, 5:57 AM

## 2019-12-15 NOTE — Progress Notes (Signed)
PT Cancellation Note  Patient Details Name: Martin Richardson MRN: [id number] DOB: 1925-02-18   Cancelled Treatment:    Reason Eval/Treat Not Completed: Medical issues which prohibited therapy.   Troponin's still trending up per later afternoon cardiology note, will hold today and check on pt 11/19. 12/15/2019  Martin Carne., PT Acute Rehabilitation Services [phone]  (pager) [phone]  (office)   Martin Richardson 12/15/2019, 5:25 PM

## 2019-12-15 NOTE — ED Provider Notes (Signed)
[facility] EMERGENCY DEPARTMENT Provider Note   CSN: [id number] Arrival date & time: 12/14/19  2108     History Chief Complaint  Patient presents with  . Chest Pain    Martin Richardson is a 86 y.o. male  with a history of DM, hypertension, stroke, & CKD who presents to the ED with complaints of chest pain that began @ 18:30 this evening and is fairly resolved @ present. Patient states he was getting ready to go to a church function moving about the house when he developed a funny feeling in his chest described as a discomfort/fatigue to the chest and to the bilateral shoulders with associated dyspnea. This was constant for about 1 hour and ultimately has eased off. He is currently pain free and feels back to baseline. No specific alleviating/aggravating factors. He does not recall change with activity, rest, or deep breathing. He was recently in contact with one of his daughters whom tested positive for covid so he wanted to be tested for this as well. He denies nausea, vomiting, diaphoresis, dizziness, lightheadedness, syncope, hemoptysis, recent surgery/trauma within the past 3 months, recent long travel, hormone use, personal hx of cancer, or hx of DVT/PE. He does note LLE swelling which has been chronic since June of this year S/p ankle injury, no acute change. NO prior hx of CAD that he is aware of, has never seen a cardiologist.   HPI     Past Medical History:  Diagnosis Date  . Arthritis   . Blind left eye   . Cancer of left kidney ([place])   . Cataract   . Chronic kidney disease   . Constipation   . Diabetes mellitus without complication ([place])   . Glaucoma   . Hypertension   . Neuropathy   . Stroke [facility])     Patient Active Problem List   Diagnosis Date Noted  . 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 Ankle fracture 08/12/2019  . CKD (chronic kidney disease), stage IV ([place]) 08/12/2019  . DM (diabetes mellitus), type 2 with renal complications ([place])  58/09/9831  . History of stroke 08/12/2019  . Essential hypertension 08/12/2019  . Closed right ankle fracture 08/12/2019  . Chronic angle-closure glaucoma, bilateral 01/02/2012  . Nuclear sclerotic cataract of left eye 01/02/2012  . Amblyopia 07/29/2011  . Cataract 07/29/2011  . POAG (primary open-angle glaucoma) 07/29/2011    Past Surgical History:  Procedure Laterality Date  . CHOLECYSTECTOMY    . ORIF ANKLE FRACTURE Right 08/13/2019   Procedure: OPEN REDUCTION INTERNAL FIXATION (ORIF) ANKLE FRACTURE;  Surgeon: Renette Butters, MD;  Location: [place];  Service: Orthopedics;  Laterality: Right;  . THYROID SURGERY    . TONSILLECTOMY         Family History  Problem Relation Age of Onset  . Diabetes Mellitus II Mother     Social History   Tobacco Use  . Smoking status: Former Research scientist (life sciences)  . Smokeless tobacco: Former Network engineer Use Topics  . Alcohol use: Never  . Drug use: Never    Home Medications Prior to Admission medications   Medication Sig Start Date End Date Taking? Authorizing Provider  acetaminophen (TYLENOL) 325 MG tablet Take 650 mg by mouth every 4 (four) hours as needed for mild pain or headache.    [provider]  atorvastatin (LIPITOR) 80 MG tablet Take 80 mg by mouth daily.    [provider]  betamethasone valerate (VALISONE) 0.1 % cream Apply 1 application  topically 3 (three) times a week. psoriasis    [provider]  docusate sodium (COLACE) 100 MG capsule Take 100 mg by mouth 2 (two) times daily.    [provider]  enoxaparin (LOVENOX) 30 MG/0.3ML injection Inject 0.3 mLs (30 mg total) into the skin daily. 08/16/19   Regalado, Belkys A, MD  Ergocalciferol (VITAMIN D2) 50 MCG (2000 UT) TABS Take 2,000 Units by mouth daily.    [provider]  famotidine (PEPCID) 20 MG tablet Take 20 mg by mouth at bedtime.    [provider]  gabapentin (NEURONTIN) 100 MG capsule Take 2 capsules (200 mg total) by  mouth at bedtime. 08/22/19   Allie Bossier, MD  insulin aspart (NOVOLOG) 100 UNIT/ML FlexPen Inject 15 Units into the skin 3 (three) times daily with meals. 08/22/19   Allie Bossier, MD  insulin glargine (LANTUS) 100 UNIT/ML injection Inject 0.2 mLs (20 Units total) into the skin 2 (two) times daily. 08/16/19   Regalado, Jerald Kief A, MD  Insulin Pen Needle 29G X 12MM MISC Use as directed 08/22/19   Allie Bossier, MD  levothyroxine (SYNTHROID) 125 MCG tablet Take 125 mcg by mouth daily before breakfast.    [provider]  polyethylene glycol (MIRALAX / GLYCOLAX) 17 g packet Take 17 g by mouth 2 (two) times daily.    [provider]  senna (SENOKOT) 8.6 MG TABS tablet Take 1 tablet (8.6 mg total) by mouth daily. 08/17/19   Regalado, Belkys A, MD  sorbitol 70 % SOLN Take 30 mLs by mouth daily as needed for moderate constipation. 08/16/19   Regalado, Belkys A, MD  timolol (BETIMOL) 0.5 % ophthalmic solution Place 1 drop into both eyes 2 (two) times daily.    [provider]    Allergies    Adhesive [tape]  Review of Systems   Review of Systems Constitutional: Negative for chills and fever.  HENT: Negative for congestion, ear pain and sore throat.   Respiratory: Positive for shortness of breath.   Cardiovascular: Positive for chest pain. Negative for leg swelling (acute).  Gastrointestinal: Negative for abdominal pain, diarrhea, nausea and vomiting.  Neurological: Negative for dizziness, syncope, weakness, numbness and headaches.  All other systems reviewed and are negative.  Physical Exam Updated Vital Signs BP (!) 139/92 (BP Location: Right Arm)   Pulse 67   Temp 97.8 F (36.6 C) (Oral)   Resp (!) 22   Ht 5\' 11"  (1.803 m)   Wt 100.7 kg   SpO2 99%   BMI 30.96 kg/m   Physical Exam Vitals and nursing note reviewed.  Constitutional:      General: He is not in acute distress.    Appearance: He is well-developed. He is not toxic-appearing.  HENT:     Head:  Normocephalic and atraumatic.  Eyes:     General:        Right eye: No discharge.        Left eye: No discharge.     Conjunctiva/sclera: Conjunctivae normal.  Cardiovascular:     Rate and Rhythm: Normal rate and regular rhythm.     Pulses:          Radial pulses are 2+ on the right side and 2+ on the left side.  Pulmonary:     Effort: Pulmonary effort is normal. No respiratory distress.     Breath sounds: Normal breath sounds. No wheezing, rhonchi or rales.  Abdominal:     General: There is no  distension.     Palpations: Abdomen is soft.     Tenderness: There is no abdominal tenderness.  Musculoskeletal:     Cervical back: Neck supple.     Comments: Mild trace symmetric edema to lower legs.   No calf tenderness to palpation.  Skin:    General: Skin is warm and dry.     Findings: No rash.  Neurological:     Mental Status: He is alert.     Comments: Clear speech.   Psychiatric:        Behavior: Behavior normal.   ED Results / Procedures / Treatments   Labs (all labs ordered are listed, but only abnormal results are displayed) Labs Reviewed  BASIC METABOLIC PANEL - Abnormal; Notable for the following components:      Result Value   CO2 19 (*)    Glucose, Bld 278 (*)    BUN 30 (*)    Creatinine, Ser 1.97 (*)    GFR, Estimated 31 (*)    All other components within normal limits  CBC - Abnormal; Notable for the following components:   Hemoglobin 12.9 (*)    HCT 37.6 (*)    All other components within normal limits  TROPONIN I (HIGH SENSITIVITY) - Abnormal; Notable for the following components:   Troponin I (High Sensitivity) 25 (*)    All other components within normal limits  TROPONIN I (HIGH SENSITIVITY) - Abnormal; Notable for the following components:   Troponin I (High Sensitivity) 33 (*)    All other components within normal limits  RESPIRATORY PANEL BY RT PCR (FLU A&B, COVID)  BRAIN NATRIURETIC PEPTIDE    EKG EKG Interpretation  Date/Time:  Wednesday  December 14 2019 21:22:41 EST Ventricular Rate:  84 PR Interval:  314 QRS Duration: 90 QT Interval:  354 QTC Calculation: 418 R Axis:   -11 Text Interpretation: Sinus rhythm with 1st degree A-V block Minimal voltage criteria for LVH, may be normal variant ( R in aVL ) Inferior infarct , age undetermined Possible Anterior infarct , age undetermined Abnormal ECG Confirmed by Orpah Greek [phone]) on 12/15/2019 12:50:46 AM   Radiology DG Chest Portable 1 View  Result Date: 12/14/2019 CLINICAL DATA:  Chest pain and shortness of breath.  COVID exposure. EXAM: PORTABLE CHEST 1 VIEW COMPARISON:  Radiographs 08/12/2019 FINDINGS: 2155 hours. The heart size and mediastinal contours are stable. Patchy bibasilar opacities are most consistent with atelectasis. There is no confluent airspace opacity, pleural effusion or pneumothorax. Surgical clips are noted at the thoracic inlet on the left. No acute osseous findings. IMPRESSION: Probable bibasilar atelectasis. No consolidation to suggest pneumonia. Electronically Signed   By: Richardean Sale M.D.   On: 12/14/2019 22:07    Procedures Procedures (including critical care time)  Medications Ordered in ED Medications  aspirin chewable tablet 324 mg (has no administration in time range)    ED Course  I have reviewed the triage vital signs and the nursing notes.  Pertinent labs & imaging results that were available during my care of the patient were reviewed by me and considered in my medical decision making (see chart for details).    MDM Rules/Calculators/A&P                         Patient presents to the ED with complaints of chest discomfort that is resolved at present.  Nontoxic, vitals without significant abnormality.  Exam fairly benign, trace symmetric LE edema.   Additional  history obtained:  Additional history obtained from chart review & nursing note review. Previous records obtained and reviewed.   DDX: ACS, PE, dissection,  pneumonia, viral illness, GERD, MSK, anxiety, pneumothorax, CHF.   EKG: No STEMI.   Lab Tests:  I reviewed and interpreted labs, which included:  CBC: Anemia improved from prior.  BMP: fairly similar to prior, poor renal function.  Troponins: Mildly up trending 25--> 33.  COVID/influenza testing: Negative  Imaging Studies ordered:  CXR ordered by triage, I independently visualized and interpreted imaging which showed Probable bibasilar atelectasis. No consolidation to suggest pneumonia  Patient w/ elevated heart pathway score at 5, mild increase with delta troponin.  Currently chest pain free, will give aspirin and discuss with hospitalist service for admission. Patient in agreement.   Findings and plan of care discussed with supervising physician Dr. Betsey Holiday who is in agreement.   01:23: CONSULT: Discussed with hospitalist Dr. Nevada Crane- accepts admission.   Portions of this note were generated with Lobbyist. Dictation errors may occur despite best attempts at proofreading.  Final Clinical Impression(s) / ED Diagnoses Final diagnoses:  Chest pain, unspecified type    Rx / DC Orders ED Discharge Orders    None       Amaryllis Dyke, PA-C 12/15/19 0135    Orpah Greek, MD 12/15/19 [phone]

## 2019-12-15 NOTE — ED Notes (Signed)
Attempted to call report. Nurse "passing meds", will call back

## 2019-12-15 NOTE — Progress Notes (Deleted)
Progress Note  Patient Name: Martin Richardson Date of Encounter: 12/16/2019  [facility] Cardiologist: No primary care provider on file.   Subjective   No chest discomfort overnight.  Early a.m. creatinine 1.8.  Slept well.  Inpatient Medications    Scheduled Meds: . amLODipine  5 mg Oral Daily  . aspirin  81 mg Oral Daily  . atorvastatin  80 mg Oral Daily  . cholecalciferol  2,000 Units Oral Daily  . famotidine  20 mg Oral QHS  . gabapentin  200 mg Oral QHS  . heparin injection (subcutaneous)  5,000 Units Subcutaneous Q8H  . influenza vaccine adjuvanted  0.5 mL Intramuscular Tomorrow-1000  . insulin aspart  0-9 Units Subcutaneous TID WC  . levothyroxine  125 mcg Oral QAC breakfast  . sodium chloride flush  3 mL Intravenous Q12H  . timolol  1 drop Both Eyes BID   Continuous Infusions: . sodium chloride    . sodium chloride 75 mL/hr at 12/16/19 0325   PRN Meds: sodium chloride, sodium chloride flush   Vital Signs    Vitals:   12/15/19 1519 12/15/19 1636 12/15/19 2017 12/16/19 0325  BP: (!) 144/76 (!) 172/80 (!) 154/85 (!) 145/73  Pulse:  62 67 62  Resp:  20 18 18   Temp:  97.7 F (36.5 C) 97.9 F (36.6 C) 97.7 F (36.5 C)  TempSrc:  Oral Rectal Oral  SpO2:  100% 95% 94%  Weight:    98.9 kg  Height:        Intake/Output Summary (Last 24 hours) at 12/16/2019 0705 Last data filed at 12/16/2019 8242 Gross per 24 hour  Intake 1584.79 ml  Output 1450 ml  Net 134.79 ml   Last 3 Weights 12/16/2019 12/15/2019 12/15/2019  Weight (lbs) 218 lb 0.6 oz 222 lb 222 lb  Weight (kg) 98.9 kg 100.699 kg 100.699 kg      Telemetry    Sinus rhythm and sinus bradycardia overnight- Personally Reviewed  ECG    No new data- Personally Reviewed  Physical Exam  Looks well this morning.  Lying flat. GEN: No acute distress.   Neck: No JVD Cardiac: RRR, 2/6 right upper sternal and left mid sternal systolic murmur, without rubs, or gallops.  Radial pulses are 2+ and  symmetric bilateral Respiratory: Clear to auscultation bilaterally. GI: Soft, nontender, non-distended  MS: No edema; No deformity. Neuro:  Nonfocal  Psych: Normal affect   Labs    High Sensitivity Troponin:   Recent Labs  Lab 12/14/19 2148 12/14/19 2352 12/15/19 0636 12/15/19 0837  TROPONINIHS 25* 33* 112* 158*      Chemistry Recent Labs  Lab 12/14/19 2148 12/15/19 0636 12/16/19 0256  NA 136 140 138  K 4.2 4.2 4.2  CL 108 110 110  CO2 19* 23 23  GLUCOSE 278* 153* 141*  BUN 30* 28* 29*  CREATININE 1.97* 1.91* 1.84*  CALCIUM 9.6 9.5 9.4  PROT  --  5.5*  --   ALBUMIN  --  3.3*  --   AST  --  13*  --   ALT  --  14  --   ALKPHOS  --  60  --   BILITOT  --  0.6  --   GFRNONAA 31* 32* 34*  ANIONGAP 9 7 5      Hematology Recent Labs  Lab 12/14/19 2148 12/15/19 0636 12/16/19 0256  WBC 6.3 5.2 6.6  RBC 4.32 4.05* 4.14*  HGB 12.9* 12.4* 12.5*  HCT 37.6* 36.0* 36.2*  MCV 87.0 88.9 87.4  MCH 29.9 30.6 30.2  MCHC 34.3 34.4 34.5  RDW 13.0 13.0 13.1  PLT 185 166 185    BNP Recent Labs  Lab 12/15/19 0124  BNP 83.1     DDimer No results for input(s): DDIMER in the last 168 hours.   Radiology    DG Chest Portable 1 View  Result Date: 12/14/2019 CLINICAL DATA:  Chest pain and shortness of breath.  COVID exposure. EXAM: PORTABLE CHEST 1 VIEW COMPARISON:  Radiographs 08/12/2019 FINDINGS: 2155 hours. The heart size and mediastinal contours are stable. Patchy bibasilar opacities are most consistent with atelectasis. There is no confluent airspace opacity, pleural effusion or pneumothorax. Surgical clips are noted at the thoracic inlet on the left. No acute osseous findings. IMPRESSION: Probable bibasilar atelectasis. No consolidation to suggest pneumonia. Electronically Signed   By: Richardean Sale M.D.   On: 12/14/2019 22:07   ECHOCARDIOGRAM COMPLETE  Result Date: 12/15/2019    ECHOCARDIOGRAM REPORT   Patient Name:   Martin Richardson Date of Exam: 12/15/2019  Medical Rec #:  [id number]         Height:       71.0 in Accession #:    [id number]        Weight:       222.0 lb Date of Birth:  [birth year]         BSA:          2.204 m Patient Age:    86 years          BP:           168/82 mmHg Patient Gender: M                 HR:           57 bpm. Exam Location:  Inpatient Procedure: 2D Echo, Cardiac Doppler, Color Doppler and Intracardiac            Opacification Agent Indications:    Elevated troponin  History:        Patient has no prior history of Echocardiogram examinations.                 Risk Factors:Diabetes and Hypertension. H/O CVA. CKD. DOE.  Sonographer:    Clayton Lefort RDCS (AE) Referring Phys: [id number] [facility]  Sonographer Comments: Suboptimal subcostal window and patient is morbidly obese. IMPRESSIONS  1. Left ventricular ejection fraction, by estimation, is 60 to 65%. The left ventricle has normal function. The left ventricle has no regional wall motion abnormalities. There is severe asymmetric left ventricular hypertrophy of the basal-septal segment. Left ventricular diastolic parameters are indeterminate.  2. Right ventricular systolic function is mildly reduced. The right ventricular size is normal.  3. Left atrial size was moderately dilated.  4. The mitral valve is normal in structure. No evidence of mitral valve regurgitation. No evidence of mitral stenosis.  5. The aortic valve was not well visualized. Aortic valve regurgitation is trivial. Mild aortic valve sclerosis is present, with no evidence of aortic valve stenosis.  6. Aortic dilatation noted. There is mild dilatation of the aortic root, measuring 42 mm. 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 FINDINGS  Left Ventricle: Left ventricular ejection fraction, by estimation, is 60 to 65%. The left ventricle has normal function. The left ventricle has no regional wall motion abnormalities. Definity contrast agent was given IV to delineate the left ventricular  endocardial borders.  The left ventricular internal  cavity size was normal in size. There is severe asymmetric left ventricular hypertrophy of the basal-septal segment. Left ventricular diastolic parameters are indeterminate. Right Ventricle: The right ventricular size is normal. Right vetricular wall thickness was not assessed. Right ventricular systolic function is mildly reduced. Left Atrium: Left atrial size was moderately dilated. Right Atrium: Right atrial size was normal in size. Pericardium: There is no evidence of pericardial effusion. Mitral Valve: The mitral valve is normal in structure. No evidence of mitral valve regurgitation. No evidence of mitral valve stenosis. Tricuspid Valve: The tricuspid valve is normal in structure. Tricuspid valve regurgitation is trivial. Aortic Valve: The aortic valve was not well visualized. Aortic valve regurgitation is trivial. Mild aortic valve sclerosis is present, with no evidence of aortic valve stenosis. Aortic valve mean gradient measures 3.0 mmHg. Aortic valve peak gradient measures 4.3 mmHg. Aortic valve area, by VTI measures 3.95 cm. Pulmonic Valve: The pulmonic valve was not well visualized. Pulmonic valve regurgitation is trivial. Aorta: Aortic dilatation noted. There is mild dilatation of the aortic root, measuring 42 mm. 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 IAS/Shunts: The interatrial septum was not well visualized.  LEFT VENTRICLE PLAX 2D LVIDd:         4.50 cm      Diastology LVIDs:         3.20 cm      LV e' medial:    5.55 cm/s LV PW:         1.50 cm      LV E/e' medial:  14.2 LV IVS:        1.80 cm      LV e' lateral:   7.83 cm/s LVOT diam:     2.40 cm      LV E/e' lateral: 10.1 LV SV:         114 LV SV Index:   52 LVOT Area:     4.52 cm  LV Volumes (MOD) LV vol d, MOD A2C: 123.0 ml LV vol d, MOD A4C: 133.0 ml LV vol s, MOD A2C: 52.4 ml LV vol s, MOD A4C: 46.9 ml LV SV MOD A2C:     70.6 ml LV SV MOD A4C:     133.0 ml LV SV MOD BP:      77.3 ml RIGHT  VENTRICLE RV Basal diam:  3.00 cm RV S prime:     8.81 cm/s TAPSE (M-mode): 2.3 cm LEFT ATRIUM             Index       RIGHT ATRIUM           Index LA diam:        3.80 cm 1.72 cm/m  RA Area:     17.50 cm LA Vol (A2C):   82.1 ml 37.25 ml/m RA Volume:   43.90 ml  19.92 ml/m LA Vol (A4C):   88.8 ml 40.29 ml/m LA Biplane Vol: 91.8 ml 41.65 ml/m  AORTIC VALVE AV Area (Vmax):    3.69 cm AV Area (Vmean):   3.56 cm AV Area (VTI):     3.95 cm AV Vmax:           104.00 cm/s AV Vmean:          78.200 cm/s AV VTI:            0.290 m AV Peak Grad:      4.3 mmHg AV Mean Grad:      3.0 mmHg LVOT Vmax:  84.90 cm/s LVOT Vmean:        61.600 cm/s LVOT VTI:          0.253 m LVOT/AV VTI ratio: 0.87  AORTA Ao Root diam: 4.20 cm Ao Asc diam:  4.30 cm MITRAL VALVE               TRICUSPID VALVE MV Area (PHT): 2.83 cm    TR Peak grad:   24.0 mmHg MV Decel Time: 268 msec    TR Vmax:        245.00 cm/s MV E velocity: 78.80 cm/s MV A velocity: 74.10 cm/s  SHUNTS MV E/A ratio:  1.06        Systemic VTI:  0.25 m                            Systemic Diam: 2.40 cm Oswaldo Milian MD Electronically signed by Oswaldo Milian MD Signature Date/Time: 12/15/2019/10:13:51 AM    Final     Cardiac Studies   2D Doppler echocardiogram 12/15/2019: Normal LV function, mild aortic stenosis, dilated ascending aorta, nd severe asymmetric septal hypertrophy.  Patient Profile     86 y.o. male with a history of CVA, hypertension, hyperlipidemia, type 2 diabetes mellitus, hypothyroidism, CKD stage III-IV, and anemia who is being seen today for the evaluation of chest pain  Assessment & Plan    1. Acute coronary syndrome with probable unstable angina: Considering diagnostic possibilities, coronary angiography was recommended.  Did not feel he could exercise and very prolonged first-degree AV block with limited ability to perform Lexiscan Myoview.  Procedure and risks were discussed with the patient.  Angiography should be with  as little dye possible.  LV gram is not needed. 2. Asymmetric septal hypertrophy: I do not believe this is clinically relevant 3. Stage III/IV CKD: We will need to be monitored post procedure depending upon contrast volume.     For questions or updates, please contact [place] Please consult [email or web address] for contact info under        Signed, Sinclair Grooms, MD  12/16/2019, 7:05 AM

## 2019-12-16 ENCOUNTER — Encounter (HOSPITAL_COMMUNITY)
Admission: EM | Disposition: A | Discharge status: Home / Self Care | Payer: Self-pay | Source: Home / Self Care | Attending: Emergency Medicine

## 2019-12-16 ENCOUNTER — Encounter (HOSPITAL_COMMUNITY): Payer: Self-pay | Admitting: Cardiology

## 2019-12-16 DIAGNOSIS — I200 Unstable angina: Secondary | ICD-10-CM

## 2019-12-16 DIAGNOSIS — R079 Chest pain, unspecified: Secondary | ICD-10-CM | POA: Diagnosis not present

## 2019-12-16 DIAGNOSIS — I25110 Atherosclerotic heart disease of native coronary artery with unstable angina pectoris: Secondary | ICD-10-CM | POA: Diagnosis not present

## 2019-12-16 DIAGNOSIS — N1832 Chronic kidney disease, stage 3b: Secondary | ICD-10-CM

## 2019-12-16 HISTORY — PX: LEFT HEART CATH AND CORONARY ANGIOGRAPHY: CATH118249

## 2019-12-16 LAB — LIPID PANEL
Cholesterol: 93 mg/dL (ref 0–200)
HDL: 25 mg/dL — ABNORMAL LOW (ref >40)
LDL Cholesterol: 28 mg/dL (ref 0–99)
Total CHOL/HDL Ratio: 3.7 RATIO
Triglycerides: 199 mg/dL — ABNORMAL HIGH (ref <150)
VLDL: 40 mg/dL (ref 0–40)

## 2019-12-16 LAB — BASIC METABOLIC PANEL
Anion gap: 5 (ref 5–15)
BUN: 29 mg/dL — ABNORMAL HIGH (ref 8–23)
CO2: 23 mmol/L (ref 22–32)
Calcium: 9.4 mg/dL (ref 8.9–10.3)
Chloride: 110 mmol/L (ref 98–111)
Creatinine, Ser: 1.84 mg/dL — ABNORMAL HIGH (ref 0.61–1.24)
GFR, Estimated: 34 mL/min — ABNORMAL LOW (ref >60)
Glucose, Bld: 141 mg/dL — ABNORMAL HIGH (ref 70–99)
Potassium: 4.2 mmol/L (ref 3.5–5.1)
Sodium: 138 mmol/L (ref 135–145)

## 2019-12-16 LAB — CBC
HCT: 36.2 % — ABNORMAL LOW (ref 39.0–52.0)
Hemoglobin: 12.5 g/dL — ABNORMAL LOW (ref 13.0–17.0)
MCH: 30.2 pg (ref 26.0–34.0)
MCHC: 34.5 g/dL (ref 30.0–36.0)
MCV: 87.4 fL (ref 80.0–100.0)
Platelets: 185 10*3/uL (ref 150–400)
RBC: 4.14 MIL/uL — ABNORMAL LOW (ref 4.22–5.81)
RDW: 13.1 % (ref 11.5–15.5)
WBC: 6.6 10*3/uL (ref 4.0–10.5)
nRBC: 0 % (ref 0.0–0.2)

## 2019-12-16 LAB — GLUCOSE, CAPILLARY
Glucose-Capillary: 134 mg/dL — ABNORMAL HIGH (ref 70–99)
Glucose-Capillary: 194 mg/dL — ABNORMAL HIGH (ref 70–99)
Glucose-Capillary: 218 mg/dL — ABNORMAL HIGH (ref 70–99)
Glucose-Capillary: 203 mg/dL — ABNORMAL HIGH (ref 70–99)

## 2019-12-16 LAB — PHOSPHORUS: Phosphorus: 3 mg/dL (ref 2.5–4.6)

## 2019-12-16 LAB — MAGNESIUM: Magnesium: 1.9 mg/dL (ref 1.7–2.4)

## 2019-12-16 SURGERY — LEFT HEART CATH AND CORONARY ANGIOGRAPHY
Anesthesia: LOCAL

## 2019-12-16 MED ORDER — HEPARIN SODIUM (PORCINE) 1000 UNIT/ML IJ SOLN
INTRAMUSCULAR | Status: DC | PRN
  Administered 2019-12-16: 5000 [IU] via INTRAVENOUS

## 2019-12-16 MED ORDER — HEPARIN SODIUM (PORCINE) 5000 UNIT/ML IJ SOLN
5000.0000 [IU] | Freq: Three times a day (TID) | INTRAMUSCULAR | Status: DC
  Administered 2019-12-16 – 2019-12-17 (×2): 5000 [IU] via SUBCUTANEOUS
  Filled 2019-12-16 (×2): qty 1

## 2019-12-16 MED ORDER — SODIUM CHLORIDE 0.9% FLUSH
3.0000 mL | Freq: Two times a day (BID) | INTRAVENOUS | Status: DC
  Administered 2019-12-16 – 2019-12-17 (×2): 3 mL via INTRAVENOUS

## 2019-12-16 MED ORDER — SODIUM CHLORIDE 0.9 % IV SOLN: 250.0000 mL | INTRAVENOUS | Status: DC | PRN

## 2019-12-16 MED ORDER — ISOSORBIDE MONONITRATE ER 30 MG PO TB24
30.0000 mg | ORAL_TABLET | Freq: Every day | ORAL | Status: DC
  Administered 2019-12-16 – 2019-12-17 (×2): 30 mg via ORAL
  Filled 2019-12-16 (×2): qty 1

## 2019-12-16 MED ORDER — HEPARIN (PORCINE) IN NACL 1000-0.9 UT/500ML-% IV SOLN
INTRAVENOUS | Status: DC | PRN
  Administered 2019-12-16 (×2): 500 mL

## 2019-12-16 MED ORDER — VERAPAMIL HCL 2.5 MG/ML IV SOLN
INTRAVENOUS | Status: DC | PRN
  Administered 2019-12-16: 10 mL via INTRA_ARTERIAL

## 2019-12-16 MED ORDER — VERAPAMIL HCL 2.5 MG/ML IV SOLN
INTRAVENOUS | Status: AC
  Filled 2019-12-16: qty 2

## 2019-12-16 MED ORDER — SODIUM CHLORIDE 0.9% FLUSH: 3.0000 mL | INTRAVENOUS | Status: DC | PRN

## 2019-12-16 MED ORDER — LIDOCAINE HCL (PF) 1 % IJ SOLN
INTRAMUSCULAR | Status: AC
  Filled 2019-12-16: qty 30

## 2019-12-16 MED ORDER — HEPARIN SODIUM (PORCINE) 1000 UNIT/ML IJ SOLN
INTRAMUSCULAR | Status: AC
  Filled 2019-12-16: qty 1

## 2019-12-16 MED ORDER — HEPARIN (PORCINE) IN NACL 1000-0.9 UT/500ML-% IV SOLN
INTRAVENOUS | Status: AC
  Filled 2019-12-16: qty 1000

## 2019-12-16 MED ORDER — LIDOCAINE HCL (PF) 1 % IJ SOLN
INTRAMUSCULAR | Status: DC | PRN
  Administered 2019-12-16: 2 mL

## 2019-12-16 MED ORDER — SODIUM CHLORIDE 0.9 % WEIGHT BASED INFUSION
1.0000 mL/kg/h | INTRAVENOUS | Status: AC
  Administered 2019-12-16: 1 mL/kg/h via INTRAVENOUS

## 2019-12-16 MED ORDER — IOHEXOL 350 MG/ML SOLN
INTRAVENOUS | Status: DC | PRN
  Administered 2019-12-16: 70 mL

## 2019-12-16 SURGICAL SUPPLY — 11 items
CATH 5FR JL3.5 JR4 ANG PIG MP (CATHETERS) ×1 IMPLANT
CATH LAUNCHER 5F RADR (CATHETERS) IMPLANT
CATHETER LAUNCHER 5F RADR (CATHETERS) ×2
DEVICE RAD COMP TR BAND LRG (VASCULAR PRODUCTS) ×1 IMPLANT
GLIDESHEATH SLEND SS 6F .021 (SHEATH) ×1 IMPLANT
GUIDEWIRE INQWIRE 1.5J.035X260 (WIRE) IMPLANT
INQWIRE 1.5J .035X260CM (WIRE) ×2
KIT HEART LEFT (KITS) ×2 IMPLANT
PACK CARDIAC CATHETERIZATION (CUSTOM PROCEDURE TRAY) ×2 IMPLANT
TRANSDUCER W/STOPCOCK (MISCELLANEOUS) ×2 IMPLANT
TUBING CIL FLEX 10 FLL-RA (TUBING) ×2 IMPLANT

## 2019-12-16 NOTE — Progress Notes (Addendum)
PROGRESS NOTE    Martin Richardson  YTK:[id number] DOB: [birth year] DOA: 12/14/2019 PCP: [name]   Brief Narrative:  This 86 yrs old male with medical history significant for type 2 diabetes, hypertension, CVA, CKD stage IIIb presents in the ED with sudden onset of chest tightness [place].Marland KitchenHe is admitted for chest pain rule out ACS.  EKG no evidence of Acute ischemia. Troponins trending up.  Cardiology consulted,  advised to continue aspirin, statins.    Patient underwent left heart cath on 12/16/19. 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  cardiology recommend medical management.    Assessment & Plan:   Active Problems:   Chest pain   Stage 3b chronic kidney disease (HCC)   Unstable angina (HCC)  Atypical chest pain rule out ACS Presented with sudden onset chest tightness while at rest, associated with nonexertional dyspnea. Chest pain resolved by time of this visit. Initial troponin S 25 uptrending to 3>> 112>> 158. No evidence of acute ischemia on twelve-lead EKG Troponin trending up. Monitor on telemetry Cardiology consulted, thinks patient is having unstable angina. Recommended left heart cath Patient underwent left heart cath found to have two-vessel obstructive CAD without critical stenosis. Cardiology recommended medical management.  Imdur added 30 mg daily  Type 2 diabetes with hyperglycemia Hemoglobin A1c 7.3 Start insulin sliding scale Hold off home oral hypoglycemics  Sinus bradycardia Heart rate in the 50s. TSH normal Continue to monitor  Hypothyroidism TSH normal Resume home levothyroxine  Essential hypertension BP is uncontrolled Resume home oral antihypertensive  History of prior CVA Continue home statin  CKD 3B Appears to be at baseline Avoid nephrotoxins Gentle IV fluid hydration normal saline at 50 cc/h x 2 days Monitor  urine output Repeat renal panel in the morning    DVT prophylaxis: Heparin subcu Code Status: Full code Family Communication: Daughter was at bedside Disposition Plan:    Status is: Observation  The patient remains OBS appropriate and will d/c before 2 midnights.  Dispo:  Patient From: Home  Planned Disposition: Home  Expected discharge date: 12/17/19  Medically stable for discharge: No   Consultants:   Cardiology  Procedures: LHC  Antimicrobials:   Anti-infectives (From admission, onward)   None     Subjective: Patient was seen and examined at bedside.  Overnight events noted patient is a s/p left heart cath found to have two-vessel nonobstructive disease.  Patient cleared to be discharged from cardiology.  Patient feels weak and tired, patient's daughter doesn't want him to go home by self.   Objective: Vitals:   12/16/19 0808 12/16/19 0823 12/16/19 0854 12/16/19 0908  BP: (!) 143/71 (!) 147/67 133/77 (!) 148/123  Pulse: 60 (!) 56 (!) 53 (!) 56  Resp: 10 13    Temp:  97.7 F (36.5 C)    TempSrc:  Oral    SpO2: 99% 95% 97% 96%  Weight:      Height:        Intake/Output Summary (Last 24 hours) at 12/16/2019 1555 Last data filed at 12/16/2019 1500 Gross per 24 hour  Intake 1090 ml  Output 1450 ml  Net -360 ml   Filed Weights   12/15/19 0035 12/15/19 1107 12/16/19 0325  Weight: 100.7 kg 100.7 kg 98.9 kg    Examination:  General exam: Appears calm and comfortable  Respiratory system: Clear to auscultation. Respiratory effort normal. Cardiovascular system: S1 &  S2 heard, RRR. No JVD, murmurs, rubs, gallops or clicks. No pedal edema. Gastrointestinal system: Abdomen is nondistended, soft and nontender. No organomegaly or masses felt. Normal bowel sounds heard. Central nervous system: Alert and oriented. No focal neurological deficits. Extremities: No edema, no cyanosis, no clubbing Skin: No rashes, lesions or ulcers Psychiatry: Judgement and insight  appear normal. Mood & affect appropriate.     Data Reviewed: I have personally reviewed following labs and imaging studies  CBC: Recent Labs  Lab 12/14/19 2148 12/15/19 0636 12/16/19 0256  WBC 6.3 5.2 6.6  NEUTROABS  --  2.0  --   HGB 12.9* 12.4* 12.5*  HCT 37.6* 36.0* 36.2*  MCV 87.0 88.9 87.4  PLT 185 166 867   Basic Metabolic Panel: Recent Labs  Lab 12/14/19 2148 12/15/19 0636 12/16/19 0256  NA 136 140 138  K 4.2 4.2 4.2  CL 108 110 110  CO2 19* 23 23  GLUCOSE 278* 153* 141*  BUN 30* 28* 29*  CREATININE 1.97* 1.91* 1.84*  CALCIUM 9.6 9.5 9.4  MG  --  1.9 1.9  PHOS  --   --  3.0   GFR: Estimated Creatinine Clearance: 30.7 mL/min (A) (by C-G formula based on SCr of 1.84 mg/dL (H)). Liver Function Tests: Recent Labs  Lab 12/15/19 0636  AST 13*  ALT 14  ALKPHOS 60  BILITOT 0.6  PROT 5.5*  ALBUMIN 3.3*   No results for input(s): LIPASE, AMYLASE in the last 168 hours. No results for input(s): AMMONIA in the last 168 hours. Coagulation Profile: No results for input(s): INR, PROTIME in the last 168 hours. Cardiac Enzymes: No results for input(s): CKTOTAL, CKMB, CKMBINDEX, TROPONINI in the last 168 hours. BNP (last 3 results) No results for input(s): PROBNP in the last 8760 hours. HbA1C: Recent Labs    12/15/19 0638  HGBA1C 7.3*   CBG: Recent Labs  Lab 12/15/19 1211 12/15/19 1630 12/15/19 2119 12/16/19 1020 12/16/19 1224  GLUCAP 141* 204* 155* 134* 194*   Lipid Profile: Recent Labs    12/16/19 0256  CHOL 93  HDL 25*  LDLCALC 28  TRIG 199*  CHOLHDL 3.7   Thyroid Function Tests: Recent Labs    12/15/19 1203  TSH 1.450   Anemia Panel: No results for input(s): VITAMINB12, FOLATE, FERRITIN, TIBC, IRON, RETICCTPCT in the last 72 hours. Sepsis Labs: No results for input(s): PROCALCITON, LATICACIDVEN in the last 168 hours.  Recent Results (from the past 240 hour(s))  Respiratory Panel by RT PCR (Flu A&B, Covid) - Nasopharyngeal Swab      Status: None   Collection Time: 12/14/19 10:27 PM   Specimen: Nasopharyngeal Swab  Result Value Ref Range Status   SARS Coronavirus 2 by RT PCR NEGATIVE NEGATIVE Final    Comment: (NOTE) SARS-CoV-2 target nucleic acids are NOT DETECTED.  The SARS-CoV-2 RNA is generally detectable in upper respiratoy specimens during the acute phase of infection. The lowest concentration of SARS-CoV-2 viral copies this assay can detect is 131 copies/mL. A negative result does not preclude SARS-Cov-2 infection and should not be used as the sole basis for treatment or other patient management decisions. A negative result may occur with  improper specimen collection/handling, submission of specimen other than nasopharyngeal swab, presence of viral mutation(s) within the areas targeted by this assay, and inadequate number of viral copies (<131 copies/mL). A negative result must be combined with clinical observations, patient history, and epidemiological information. The expected result is Negative.  Fact Sheet for Patients:  PinkCheek.be  Fact Sheet for Healthcare Providers:  GravelBags.it  This test is no t yet approved or cleared by the [place] FDA and  has been authorized for detection and/or diagnosis of SARS-CoV-2 by FDA under an Emergency Use Authorization (EUA). This EUA will remain  in effect (meaning this test can be used) for the duration of the COVID-19 declaration under Section 564(b)(1) of the Act, 21 U.S.C. section 360bbb-3(b)(1), unless the authorization is terminated or revoked sooner.     Influenza A by PCR NEGATIVE NEGATIVE Final   Influenza B by PCR NEGATIVE NEGATIVE Final    Comment: (NOTE) The Xpert Xpress SARS-CoV-2/FLU/RSV assay is intended as an aid in  the diagnosis of influenza from Nasopharyngeal swab specimens and  should not be used as a sole basis for treatment. Nasal washings and  aspirates are  unacceptable for Xpert Xpress SARS-CoV-2/FLU/RSV  testing.  Fact Sheet for Patients: PinkCheek.be  Fact Sheet for Healthcare Providers: GravelBags.it  This test is not yet approved or cleared by the [place] FDA and  has been authorized for detection and/or diagnosis of SARS-CoV-2 by  FDA under an Emergency Use Authorization (EUA). This EUA will remain  in effect (meaning this test can be used) for the duration of the  Covid-19 declaration under Section 564(b)(1) of the Act, 21  U.S.C. section 360bbb-3(b)(1), unless the authorization is  terminated or revoked. Performed at [facility], [place] [address], [place], [place] [address]     Radiology Studies: CARDIAC CATHETERIZATION  Result Date: 12/16/2019  Mid LM to Dist LM lesion is 20% stenosed.  Mid LAD lesion is 50% stenosed.  Dist LAD lesion is 80% stenosed.  2nd Diag lesion is 85% stenosed.  Mid Cx lesion is 70% stenosed.  LV end diastolic pressure is normal.  1. 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 Normal LVEDP Plan: recommend medical management.   DG Chest Portable 1 View  Result Date: 12/14/2019 CLINICAL DATA:  Chest pain and shortness of breath.  COVID exposure. EXAM: PORTABLE CHEST 1 VIEW COMPARISON:  Radiographs 08/12/2019 FINDINGS: 2155 hours. The heart size and mediastinal contours are stable. Patchy bibasilar opacities are most consistent with atelectasis. There is no confluent airspace opacity, pleural effusion or pneumothorax. Surgical clips are noted at the thoracic inlet on the left. No acute osseous findings. IMPRESSION: Probable bibasilar atelectasis. No consolidation to suggest pneumonia. Electronically Signed   By: Richardean Sale M.D.   On: 12/14/2019 22:07   ECHOCARDIOGRAM COMPLETE  Result Date: 12/15/2019    ECHOCARDIOGRAM REPORT   Patient Name:   Martin Richardson Date of Exam: 12/15/2019 Medical Rec #:  [id number]          Height:       71.0 in Accession #:    [id number]        Weight:       222.0 lb Date of Birth:  27-Jul-1925         BSA:          2.204 m Patient Age:    76 years          BP:           168/82 mmHg Patient Gender: M                 HR:           57 bpm. Exam Location:  Inpatient Procedure: 2D Echo, Cardiac Doppler, Color Doppler and Intracardiac  Opacification Agent Indications:    Elevated troponin  History:        Patient has no prior history of Echocardiogram examinations.                 Risk Factors:Diabetes and Hypertension. H/O CVA. CKD. DOE.  Sonographer:    Clayton Lefort RDCS (AE) Referring Phys: [id number] [facility]  Sonographer Comments: Suboptimal subcostal window and patient is morbidly obese. IMPRESSIONS  1. Left ventricular ejection fraction, by estimation, is 60 to 65%. The left ventricle has normal function. The left ventricle has no regional wall motion abnormalities. There is severe asymmetric left ventricular hypertrophy of the basal-septal segment. Left ventricular diastolic parameters are indeterminate.  2. Right ventricular systolic function is mildly reduced. The right ventricular size is normal.  3. Left atrial size was moderately dilated.  4. The mitral valve is normal in structure. No evidence of mitral valve regurgitation. No evidence of mitral stenosis.  5. The aortic valve was not well visualized. Aortic valve regurgitation is trivial. Mild aortic valve sclerosis is present, with no evidence of aortic valve stenosis.  6. Aortic dilatation noted. There is mild dilatation of the aortic root, measuring 42 mm. 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 FINDINGS  Left Ventricle: Left ventricular ejection fraction, by estimation, is 60 to 65%. The left ventricle has normal function. The left ventricle has no regional wall motion abnormalities. Definity contrast agent was given IV to delineate the left ventricular  endocardial borders. The left ventricular internal  cavity size was normal in size. There is severe asymmetric left ventricular hypertrophy of the basal-septal segment. Left ventricular diastolic parameters are indeterminate. Right Ventricle: The right ventricular size is normal. Right vetricular wall thickness was not assessed. Right ventricular systolic function is mildly reduced. Left Atrium: Left atrial size was moderately dilated. Right Atrium: Right atrial size was normal in size. Pericardium: There is no evidence of pericardial effusion. Mitral Valve: The mitral valve is normal in structure. No evidence of mitral valve regurgitation. No evidence of mitral valve stenosis. Tricuspid Valve: The tricuspid valve is normal in structure. Tricuspid valve regurgitation is trivial. Aortic Valve: The aortic valve was not well visualized. Aortic valve regurgitation is trivial. Mild aortic valve sclerosis is present, with no evidence of aortic valve stenosis. Aortic valve mean gradient measures 3.0 mmHg. Aortic valve peak gradient measures 4.3 mmHg. Aortic valve area, by VTI measures 3.95 cm. Pulmonic Valve: The pulmonic valve was not well visualized. Pulmonic valve regurgitation is trivial. Aorta: Aortic dilatation noted. There is mild dilatation of the aortic root, measuring 42 mm. 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 IAS/Shunts: The interatrial septum was not well visualized.  LEFT VENTRICLE PLAX 2D LVIDd:         4.50 cm      Diastology LVIDs:         3.20 cm      LV e' medial:    5.55 cm/s LV PW:         1.50 cm      LV E/e' medial:  14.2 LV IVS:        1.80 cm      LV e' lateral:   7.83 cm/s LVOT diam:     2.40 cm      LV E/e' lateral: 10.1 LV SV:         114 LV SV Index:   52 LVOT Area:     4.52 cm  LV  Volumes (MOD) LV vol d, MOD A2C: 123.0 ml LV vol d, MOD A4C: 133.0 ml LV vol s, MOD A2C: 52.4 ml LV vol s, MOD A4C: 46.9 ml LV SV MOD A2C:     70.6 ml LV SV MOD A4C:     133.0 ml LV SV MOD BP:      77.3 ml RIGHT VENTRICLE RV Basal diam:  3.00  cm RV S prime:     8.81 cm/s TAPSE (M-mode): 2.3 cm LEFT ATRIUM             Index       RIGHT ATRIUM           Index LA diam:        3.80 cm 1.72 cm/m  RA Area:     17.50 cm LA Vol (A2C):   82.1 ml 37.25 ml/m RA Volume:   43.90 ml  19.92 ml/m LA Vol (A4C):   88.8 ml 40.29 ml/m LA Biplane Vol: 91.8 ml 41.65 ml/m  AORTIC VALVE AV Area (Vmax):    3.69 cm AV Area (Vmean):   3.56 cm AV Area (VTI):     3.95 cm AV Vmax:           104.00 cm/s AV Vmean:          78.200 cm/s AV VTI:            0.290 m AV Peak Grad:      4.3 mmHg AV Mean Grad:      3.0 mmHg LVOT Vmax:         84.90 cm/s LVOT Vmean:        61.600 cm/s LVOT VTI:          0.253 m LVOT/AV VTI ratio: 0.87  AORTA Ao Root diam: 4.20 cm Ao Asc diam:  4.30 cm MITRAL VALVE               TRICUSPID VALVE MV Area (PHT): 2.83 cm    TR Peak grad:   24.0 mmHg MV Decel Time: 268 msec    TR Vmax:        245.00 cm/s MV E velocity: 78.80 cm/s MV A velocity: 74.10 cm/s  SHUNTS MV E/A ratio:  1.06        Systemic VTI:  0.25 m                            Systemic Diam: 2.40 cm Oswaldo Milian MD Electronically signed by Oswaldo Milian MD Signature Date/Time: 12/15/2019/10:13:51 AM    Final     Scheduled Meds: . amLODipine  5 mg Oral Daily  . aspirin  81 mg Oral Daily  . atorvastatin  80 mg Oral Daily  . cholecalciferol  2,000 Units Oral Daily  . famotidine  20 mg Oral QHS  . gabapentin  200 mg Oral QHS  . heparin  5,000 Units Subcutaneous Q8H  . influenza vaccine adjuvanted  0.5 mL Intramuscular Tomorrow-1000  . insulin aspart  0-9 Units Subcutaneous TID WC  . isosorbide mononitrate  30 mg Oral Daily  . levothyroxine  125 mcg Oral QAC breakfast  . sodium chloride flush  3 mL Intravenous Q12H  . timolol  1 drop Both Eyes BID   Continuous Infusions: . sodium chloride    . sodium chloride 1 mL/kg/hr (12/16/19 1015)     LOS: 0 days    Time spent: 25 mins    Shawna Clamp, MD [facility]   If 7PM-7AM, please contact  night-coverage

## 2019-12-16 NOTE — Progress Notes (Signed)
PT Cancellation Note  Patient Details Name: Martin Richardson MRN: [id number] DOB: 17-Jan-1926   Cancelled Treatment:    Reason Eval/Treat Not Completed: Other (comment). Pt had cath this AM. I am waiting for TR band removal +30 minutes per protocol before mobilizing. Will return shortly to eval.   Shary Decamp [facility] 12/16/2019, 3:35 PM [place] Pager [phone] Office [phone]

## 2019-12-16 NOTE — Care Management Obs Status (Signed)
B and E NOTIFICATION   Patient Details  Name: Martin Richardson MRN: [id number] Date of Birth: 04-01-1925   Medicare Observation Status Notification Given:  Yes    Bethena Roys, RN 12/16/2019, 12:20 PM

## 2019-12-16 NOTE — Care Management (Addendum)
1221 12-16-19 Case Manager spoke with patient and daughter [name] is at the bedside. Patient lives in a [place] alone on the 2nd floor- he has elevators to use. Patient uses a rolling walker, has a cane, bedside commode, and wheelchair in the home. Patient's daughter [name] lives 5 miles away and checks in on patient. Patient usually gets his medications Mail Order and his local pharmacy is [facility]. Physical Therapy to work with patient and Case Manager will follow for transition of care needs. Bethena Roys, RN,BSN Case Manager    [phone] 12-16-19 Case Manager called daughter [name] to make her aware of the Physical Therapy Recommendations for intermittent supervision. Family can provide that for the patient.  Staff RN to call [name] when the patient has a discharge order and she will provide transportation home. Bethena Roys , RN,BSN Case Manager

## 2019-12-16 NOTE — H&P (View-Only) (Signed)
Progress Note  Patient Name: Martin Richardson Date of Encounter: 12/16/2019  [facility] Cardiologist: No primary care provider on file.   Subjective   No chest pain overnight.  Rested well.  Inpatient Medications    Scheduled Meds: . amLODipine  5 mg Oral Daily  . aspirin  81 mg Oral Daily  . atorvastatin  80 mg Oral Daily  . cholecalciferol  2,000 Units Oral Daily  . famotidine  20 mg Oral QHS  . gabapentin  200 mg Oral QHS  . heparin injection (subcutaneous)  5,000 Units Subcutaneous Q8H  . influenza vaccine adjuvanted  0.5 mL Intramuscular Tomorrow-1000  . insulin aspart  0-9 Units Subcutaneous TID WC  . levothyroxine  125 mcg Oral QAC breakfast  . sodium chloride flush  3 mL Intravenous Q12H  . timolol  1 drop Both Eyes BID   Continuous Infusions: . sodium chloride    . sodium chloride 75 mL/hr at 12/16/19 0325   PRN Meds: sodium chloride, sodium chloride flush   Vital Signs    Vitals:   12/15/19 1519 12/15/19 1636 12/15/19 2017 12/16/19 0325  BP: (!) 144/76 (!) 172/80 (!) 154/85 (!) 145/73  Pulse:  62 67 62  Resp:  20 18 18   Temp:  97.7 F (36.5 C) 97.9 F (36.6 C) 97.7 F (36.5 C)  TempSrc:  Oral Rectal Oral  SpO2:  100% 95% 94%  Weight:    98.9 kg  Height:        Intake/Output Summary (Last 24 hours) at 12/16/2019 0708 Last data filed at 12/16/2019 2703 Gross per 24 hour  Intake 1584.79 ml  Output 1450 ml  Net 134.79 ml   Last 3 Weights 12/16/2019 12/15/2019 12/15/2019  Weight (lbs) 218 lb 0.6 oz 222 lb 222 lb  Weight (kg) 98.9 kg 100.699 kg 100.699 kg      Telemetry    Sinus rhythm and sinus bradycardia- Personally Reviewed  ECG    A new tracing is not been performed since yesterday.- Personally Reviewed  Physical Exam  Lying flat GEN: No acute distress.   Neck: No JVD Cardiac: RRR, 2/6 systolic murmurs, but no rubs, or gallops.  Respiratory: Clear to auscultation bilaterally. GI: Soft, nontender, non-distended  MS: No  edema; No deformity. Neuro:  Nonfocal  Psych: Normal affect   Labs    High Sensitivity Troponin:   Recent Labs  Lab 12/14/19 2148 12/14/19 2352 12/15/19 0636 12/15/19 0837  TROPONINIHS 25* 33* 112* 158*      Chemistry Recent Labs  Lab 12/14/19 2148 12/15/19 0636 12/16/19 0256  NA 136 140 138  K 4.2 4.2 4.2  CL 108 110 110  CO2 19* 23 23  GLUCOSE 278* 153* 141*  BUN 30* 28* 29*  CREATININE 1.97* 1.91* 1.84*  CALCIUM 9.6 9.5 9.4  PROT  --  5.5*  --   ALBUMIN  --  3.3*  --   AST  --  13*  --   ALT  --  14  --   ALKPHOS  --  60  --   BILITOT  --  0.6  --   GFRNONAA 31* 32* 34*  ANIONGAP 9 7 5      Hematology Recent Labs  Lab 12/14/19 2148 12/15/19 0636 12/16/19 0256  WBC 6.3 5.2 6.6  RBC 4.32 4.05* 4.14*  HGB 12.9* 12.4* 12.5*  HCT 37.6* 36.0* 36.2*  MCV 87.0 88.9 87.4  MCH 29.9 30.6 30.2  MCHC 34.3 34.4 34.5  RDW 13.0 13.0  13.1  PLT 185 166 185    BNP Recent Labs  Lab 12/15/19 0124  BNP 83.1     DDimer No results for input(s): DDIMER in the last 168 hours.   Radiology    DG Chest Portable 1 View  Result Date: 12/14/2019 CLINICAL DATA:  Chest pain and shortness of breath.  COVID exposure. EXAM: PORTABLE CHEST 1 VIEW COMPARISON:  Radiographs 08/12/2019 FINDINGS: 2155 hours. The heart size and mediastinal contours are stable. Patchy bibasilar opacities are most consistent with atelectasis. There is no confluent airspace opacity, pleural effusion or pneumothorax. Surgical clips are noted at the thoracic inlet on the left. No acute osseous findings. IMPRESSION: Probable bibasilar atelectasis. No consolidation to suggest pneumonia. Electronically Signed   By: Richardean Sale M.D.   On: 12/14/2019 22:07   ECHOCARDIOGRAM COMPLETE  Result Date: 12/15/2019    ECHOCARDIOGRAM REPORT   Patient Name:   Martin Richardson Date of Exam: 12/15/2019 Medical Rec #:  [id number]         Height:       71.0 in Accession #:    [id number]        Weight:       222.0 lb  Date of Birth:  [birth year]         BSA:          2.204 m Patient Age:    86 years          BP:           168/82 mmHg Patient Gender: M                 HR:           57 bpm. Exam Location:  Inpatient Procedure: 2D Echo, Cardiac Doppler, Color Doppler and Intracardiac            Opacification Agent Indications:    Elevated troponin  History:        Patient has no prior history of Echocardiogram examinations.                 Risk Factors:Diabetes and Hypertension. H/O CVA. CKD. DOE.  Sonographer:    Clayton Lefort RDCS (AE) Referring Phys: [id number] [facility]  Sonographer Comments: Suboptimal subcostal window and patient is morbidly obese. IMPRESSIONS  1. Left ventricular ejection fraction, by estimation, is 60 to 65%. The left ventricle has normal function. The left ventricle has no regional wall motion abnormalities. There is severe asymmetric left ventricular hypertrophy of the basal-septal segment. Left ventricular diastolic parameters are indeterminate.  2. Right ventricular systolic function is mildly reduced. The right ventricular size is normal.  3. Left atrial size was moderately dilated.  4. The mitral valve is normal in structure. No evidence of mitral valve regurgitation. No evidence of mitral stenosis.  5. The aortic valve was not well visualized. Aortic valve regurgitation is trivial. Mild aortic valve sclerosis is present, with no evidence of aortic valve stenosis.  6. Aortic dilatation noted. There is mild dilatation of the aortic root, measuring 42 mm. 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 FINDINGS  Left Ventricle: Left ventricular ejection fraction, by estimation, is 60 to 65%. The left ventricle has normal function. The left ventricle has no regional wall motion abnormalities. Definity contrast agent was given IV to delineate the left ventricular  endocardial borders. The left ventricular internal cavity size was normal in size. There is severe asymmetric left ventricular  hypertrophy of the basal-septal segment. Left  ventricular diastolic parameters are indeterminate. Right Ventricle: The right ventricular size is normal. Right vetricular wall thickness was not assessed. Right ventricular systolic function is mildly reduced. Left Atrium: Left atrial size was moderately dilated. Right Atrium: Right atrial size was normal in size. Pericardium: There is no evidence of pericardial effusion. Mitral Valve: The mitral valve is normal in structure. No evidence of mitral valve regurgitation. No evidence of mitral valve stenosis. Tricuspid Valve: The tricuspid valve is normal in structure. Tricuspid valve regurgitation is trivial. Aortic Valve: The aortic valve was not well visualized. Aortic valve regurgitation is trivial. Mild aortic valve sclerosis is present, with no evidence of aortic valve stenosis. Aortic valve mean gradient measures 3.0 mmHg. Aortic valve peak gradient measures 4.3 mmHg. Aortic valve area, by VTI measures 3.95 cm. Pulmonic Valve: The pulmonic valve was not well visualized. Pulmonic valve regurgitation is trivial. Aorta: Aortic dilatation noted. There is mild dilatation of the aortic root, measuring 42 mm. 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 IAS/Shunts: The interatrial septum was not well visualized.  LEFT VENTRICLE PLAX 2D LVIDd:         4.50 cm      Diastology LVIDs:         3.20 cm      LV e' medial:    5.55 cm/s LV PW:         1.50 cm      LV E/e' medial:  14.2 LV IVS:        1.80 cm      LV e' lateral:   7.83 cm/s LVOT diam:     2.40 cm      LV E/e' lateral: 10.1 LV SV:         114 LV SV Index:   52 LVOT Area:     4.52 cm  LV Volumes (MOD) LV vol d, MOD A2C: 123.0 ml LV vol d, MOD A4C: 133.0 ml LV vol s, MOD A2C: 52.4 ml LV vol s, MOD A4C: 46.9 ml LV SV MOD A2C:     70.6 ml LV SV MOD A4C:     133.0 ml LV SV MOD BP:      77.3 ml RIGHT VENTRICLE RV Basal diam:  3.00 cm RV S prime:     8.81 cm/s TAPSE (M-mode): 2.3 cm LEFT ATRIUM              Index       RIGHT ATRIUM           Index LA diam:        3.80 cm 1.72 cm/m  RA Area:     17.50 cm LA Vol (A2C):   82.1 ml 37.25 ml/m RA Volume:   43.90 ml  19.92 ml/m LA Vol (A4C):   88.8 ml 40.29 ml/m LA Biplane Vol: 91.8 ml 41.65 ml/m  AORTIC VALVE AV Area (Vmax):    3.69 cm AV Area (Vmean):   3.56 cm AV Area (VTI):     3.95 cm AV Vmax:           104.00 cm/s AV Vmean:          78.200 cm/s AV VTI:            0.290 m AV Peak Grad:      4.3 mmHg AV Mean Grad:      3.0 mmHg LVOT Vmax:         84.90 cm/s LVOT Vmean:  61.600 cm/s LVOT VTI:          0.253 m LVOT/AV VTI ratio: 0.87  AORTA Ao Root diam: 4.20 cm Ao Asc diam:  4.30 cm MITRAL VALVE               TRICUSPID VALVE MV Area (PHT): 2.83 cm    TR Peak grad:   24.0 mmHg MV Decel Time: 268 msec    TR Vmax:        245.00 cm/s MV E velocity: 78.80 cm/s MV A velocity: 74.10 cm/s  SHUNTS MV E/A ratio:  1.06        Systemic VTI:  0.25 m                            Systemic Diam: 2.40 cm Oswaldo Milian MD Electronically signed by Oswaldo Milian MD Signature Date/Time: 12/15/2019/10:13:51 AM    Final     Cardiac Studies   Echocardiogram as noted above.  Septal hypertrophy is prominent.  Patient Profile     86 y.o. male history of CVA, primary hypertension, hyperlipidemia, type 2 diabetes mellitus, hypothyroidism, stage III CKD, anemia, who presented with chest discomfort compatible with unstable angina.  Assessment & Plan    1. Unstable angina pectoris: Decided in favor of coronary angiography due to unlikelihood of being able to complete a exercise treadmill test and long first-degree AV block causes concerned about given Lexiscan.  Referral will be performed without left ventriculography, using as little contrast as possible.  Symptoms are concerning and EKG with prior evidence of inferior infarction raises concern for potentially critical multivessel disease.  I mostly want to exclude the possibility of left main disease. 2. Stage  III CKD: He has been hydrated overnight.  Nephrotoxic agents have been removed. 3. 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

## 2019-12-16 NOTE — Evaluation (Signed)
Physical Therapy Evaluation & Discharge Patient Details Name: Martin Richardson MRN: [id number] DOB: 02-13-1925 Today's Date: 12/16/2019   History of Present Illness  Pt is a 86 y.o. male admitted 12/15/19 with chest pain. S/p radial cardiac cath on 11/19 with findings of CAD of 2 vessels; to be treated medically. PMH includes DM, HTN, CVA, CKD, neuropathy, kidney CA, rt ankle fx (07/2019).    Clinical Impression  Patient evaluated by Physical Therapy with no further acute PT needs identified. PTA, pt mod indep with intermittent use of SPC, lives alone, drives, and stays active; daughter lives nearby and assists as needed. Today, pt moving well with SPC at supervision-level. Increased time practicing transfers while keeping weight off RUE. All education has been completed and the patient has no further questions. Acute PT is signing off. Thank you for this referral.    Follow Up Recommendations No PT follow up;Supervision - Intermittent    Equipment Recommendations  None recommended by PT    Recommendations for Other Services       Precautions / Restrictions Precautions Precautions: Fall Restrictions Weight Bearing Restrictions: Yes Other Position/Activity Restrictions: Attempted to keep as "NWB" as possible post-cath; pt reports doctor said "cannot lift more than 5 lbs"      Mobility  Bed Mobility Overal bed mobility: Modified Independent             General bed mobility comments: HOB slightly elevated, reliant on momentum to power into sitting with cues to not use RUE; no physical assist required    Transfers Overall transfer level: Needs assistance Equipment used: None Transfers: Sit to/from Stand Sit to Stand: Supervision         General transfer comment: Able to stand from EOB and recliner with supervision for safety; initial cues to keep from using R hand, pt able to remind himself on second trial later in session  Ambulation/Gait Ambulation/Gait assistance:  Min guard;Supervision Gait Distance (Feet): 200 Feet Assistive device: Straight cane;None Gait Pattern/deviations: Step-through pattern;Decreased stride length;Trunk flexed Gait velocity: Decreased   General Gait Details: Slow, steady gait with SPC in L hand (pt typically holds it in R, but limited s/p cath), initial min guard for balance progressing to supervision; pt also ambulating in romo without DME at [place] Mobility    Modified Rankin (Stroke Patients Only)       Balance Overall balance assessment: Mild deficits observed, not formally tested                                           Pertinent Vitals/Pain Pain Assessment: No/denies pain    Home Living Family/patient expects to be discharged to:: Private residence Living Arrangements: Alone Available Help at Discharge: Family;Available PRN/intermittently Type of Home: Other(Comment) (condo) Home Access: Elevator     Home Layout: One level Home Equipment: Shower seat - built in;Toilet riser;Walker - 2 wheels;Cane - single point Additional Comments: Daughter lives nearby and checks on pt    Prior Function Level of Independence: Independent with assistive device(s)         Comments: Independent household ambulation, uses SPC in R-hand when ambulating outside, drives and indep with ADLs. Daughter assists with some iADLs.     Hand Dominance   Dominant Hand: Right    Extremity/Trunk Assessment   Upper Extremity Assessment Upper  Extremity Assessment: RUE deficits/detail RUE Deficits / Details: s/p cath; functionally >3/5 throughout    Lower Extremity Assessment Lower Extremity Assessment: RLE deficits/detail RLE Deficits / Details: h/o R ankle ORIF (07/2019) with swelling noted around ankle (no significant edema); hip and knee functionall >3/5 throughout       Communication   Communication: No difficulties  Cognition Arousal/Alertness:  Awake/alert Behavior During Therapy: WFL for tasks assessed/performed Overall Cognitive Status: No family/caregiver present to determine baseline cognitive functioning                                 General Comments: Potential short-term memory deficits, pt requiring frequent cues to prevent WB through RUE with mobility (of note, this is pt's dominant hand)      General Comments      Exercises     Assessment/Plan    PT Assessment Patent does not need any further PT services  PT Problem List         PT Treatment Interventions      PT Goals (Current goals can be found in the Care Plan section)  Acute Rehab PT Goals PT Goal Formulation: All assessment and education complete, DC therapy    Frequency     Barriers to discharge        Co-evaluation               AM-PAC PT "6 Clicks" Mobility  Outcome Measure Help needed turning from your back to your side while in a flat bed without using bedrails?: None Help needed moving from lying on your back to sitting on the side of a flat bed without using bedrails?: None Help needed moving to and from a bed to a chair (including a wheelchair)?: None Help needed standing up from a chair using your arms (e.g., wheelchair or bedside chair)?: None Help needed to walk in hospital room?: None Help needed climbing 3-5 steps with a railing? : A Little 6 Click Score: 23    End of Session Equipment Utilized During Treatment: Gait belt Activity Tolerance: Patient tolerated treatment well Patient left: in chair;with call bell/phone within reach;with chair alarm set Nurse Communication: Mobility status PT Visit Diagnosis: Other abnormalities of gait and mobility (R26.89)    Time: 1950-9326 PT Time Calculation (min) (ACUTE ONLY): 25 min   Charges:   PT Evaluation $PT Eval Moderate Complexity: 1 Mod PT Treatments $Therapeutic Activity: 8-22 mins   Mabeline Caras, PT, DPT Acute Rehabilitation Services  Pager  [phone] Office [place] 12/16/2019, 5:17 PM

## 2019-12-16 NOTE — Progress Notes (Signed)
Progress Note  Patient Name: Martin Richardson Date of Encounter: 12/16/2019  [facility] Cardiologist: No primary care provider on file.   Subjective   No chest pain overnight.  Rested well.  Inpatient Medications    Scheduled Meds: . amLODipine  5 mg Oral Daily  . aspirin  81 mg Oral Daily  . atorvastatin  80 mg Oral Daily  . cholecalciferol  2,000 Units Oral Daily  . famotidine  20 mg Oral QHS  . gabapentin  200 mg Oral QHS  . heparin injection (subcutaneous)  5,000 Units Subcutaneous Q8H  . influenza vaccine adjuvanted  0.5 mL Intramuscular Tomorrow-1000  . insulin aspart  0-9 Units Subcutaneous TID WC  . levothyroxine  125 mcg Oral QAC breakfast  . sodium chloride flush  3 mL Intravenous Q12H  . timolol  1 drop Both Eyes BID   Continuous Infusions: . sodium chloride    . sodium chloride 75 mL/hr at 12/16/19 0325   PRN Meds: sodium chloride, sodium chloride flush   Vital Signs    Vitals:   12/15/19 1519 12/15/19 1636 12/15/19 2017 12/16/19 0325  BP: (!) 144/76 (!) 172/80 (!) 154/85 (!) 145/73  Pulse:  62 67 62  Resp:  20 18 18   Temp:  97.7 F (36.5 C) 97.9 F (36.6 C) 97.7 F (36.5 C)  TempSrc:  Oral Rectal Oral  SpO2:  100% 95% 94%  Weight:    98.9 kg  Height:        Intake/Output Summary (Last 24 hours) at 12/16/2019 0708 Last data filed at 12/16/2019 5701 Gross per 24 hour  Intake 1584.79 ml  Output 1450 ml  Net 134.79 ml   Last 3 Weights 12/16/2019 12/15/2019 12/15/2019  Weight (lbs) 218 lb 0.6 oz 222 lb 222 lb  Weight (kg) 98.9 kg 100.699 kg 100.699 kg      Telemetry    Sinus rhythm and sinus bradycardia- Personally Reviewed  ECG    A new tracing is not been performed since yesterday.- Personally Reviewed  Physical Exam  Lying flat GEN: No acute distress.   Neck: No JVD Cardiac: RRR, 2/6 systolic murmurs, but no rubs, or gallops.  Respiratory: Clear to auscultation bilaterally. GI: Soft, nontender, non-distended  MS: No  edema; No deformity. Neuro:  Nonfocal  Psych: Normal affect   Labs    High Sensitivity Troponin:   Recent Labs  Lab 12/14/19 2148 12/14/19 2352 12/15/19 0636 12/15/19 0837  TROPONINIHS 25* 33* 112* 158*      Chemistry Recent Labs  Lab 12/14/19 2148 12/15/19 0636 12/16/19 0256  NA 136 140 138  K 4.2 4.2 4.2  CL 108 110 110  CO2 19* 23 23  GLUCOSE 278* 153* 141*  BUN 30* 28* 29*  CREATININE 1.97* 1.91* 1.84*  CALCIUM 9.6 9.5 9.4  PROT  --  5.5*  --   ALBUMIN  --  3.3*  --   AST  --  13*  --   ALT  --  14  --   ALKPHOS  --  60  --   BILITOT  --  0.6  --   GFRNONAA 31* 32* 34*  ANIONGAP 9 7 5      Hematology Recent Labs  Lab 12/14/19 2148 12/15/19 0636 12/16/19 0256  WBC 6.3 5.2 6.6  RBC 4.32 4.05* 4.14*  HGB 12.9* 12.4* 12.5*  HCT 37.6* 36.0* 36.2*  MCV 87.0 88.9 87.4  MCH 29.9 30.6 30.2  MCHC 34.3 34.4 34.5  RDW 13.0 13.0  13.1  PLT 185 166 185    BNP Recent Labs  Lab 12/15/19 0124  BNP 83.1     DDimer No results for input(s): DDIMER in the last 168 hours.   Radiology    DG Chest Portable 1 View  Result Date: 12/14/2019 CLINICAL DATA:  Chest pain and shortness of breath.  COVID exposure. EXAM: PORTABLE CHEST 1 VIEW COMPARISON:  Radiographs 08/12/2019 FINDINGS: 2155 hours. The heart size and mediastinal contours are stable. Patchy bibasilar opacities are most consistent with atelectasis. There is no confluent airspace opacity, pleural effusion or pneumothorax. Surgical clips are noted at the thoracic inlet on the left. No acute osseous findings. IMPRESSION: Probable bibasilar atelectasis. No consolidation to suggest pneumonia. Electronically Signed   By: Richardean Sale M.D.   On: 12/14/2019 22:07   ECHOCARDIOGRAM COMPLETE  Result Date: 12/15/2019    ECHOCARDIOGRAM REPORT   Patient Name:   Martin Richardson Date of Exam: 12/15/2019 Medical Rec #:  [id number]         Height:       71.0 in Accession #:    [id number]        Weight:       222.0 lb  Date of Birth:  01-22-1926         BSA:          2.204 m Patient Age:    86 years          BP:           168/82 mmHg Patient Gender: M                 HR:           57 bpm. Exam Location:  Inpatient Procedure: 2D Echo, Cardiac Doppler, Color Doppler and Intracardiac            Opacification Agent Indications:    Elevated troponin  History:        Patient has no prior history of Echocardiogram examinations.                 Risk Factors:Diabetes and Hypertension. H/O CVA. CKD. DOE.  Sonographer:    Clayton Lefort RDCS (AE) Referring Phys: [id number] [facility]  Sonographer Comments: Suboptimal subcostal window and patient is morbidly obese. IMPRESSIONS  1. Left ventricular ejection fraction, by estimation, is 60 to 65%. The left ventricle has normal function. The left ventricle has no regional wall motion abnormalities. There is severe asymmetric left ventricular hypertrophy of the basal-septal segment. Left ventricular diastolic parameters are indeterminate.  2. Right ventricular systolic function is mildly reduced. The right ventricular size is normal.  3. Left atrial size was moderately dilated.  4. The mitral valve is normal in structure. No evidence of mitral valve regurgitation. No evidence of mitral stenosis.  5. The aortic valve was not well visualized. Aortic valve regurgitation is trivial. Mild aortic valve sclerosis is present, with no evidence of aortic valve stenosis.  6. Aortic dilatation noted. There is mild dilatation of the aortic root, measuring 42 mm. 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 FINDINGS  Left Ventricle: Left ventricular ejection fraction, by estimation, is 60 to 65%. The left ventricle has normal function. The left ventricle has no regional wall motion abnormalities. Definity contrast agent was given IV to delineate the left ventricular  endocardial borders. The left ventricular internal cavity size was normal in size. There is severe asymmetric left ventricular  hypertrophy of the basal-septal segment. Left  ventricular diastolic parameters are indeterminate. Right Ventricle: The right ventricular size is normal. Right vetricular wall thickness was not assessed. Right ventricular systolic function is mildly reduced. Left Atrium: Left atrial size was moderately dilated. Right Atrium: Right atrial size was normal in size. Pericardium: There is no evidence of pericardial effusion. Mitral Valve: The mitral valve is normal in structure. No evidence of mitral valve regurgitation. No evidence of mitral valve stenosis. Tricuspid Valve: The tricuspid valve is normal in structure. Tricuspid valve regurgitation is trivial. Aortic Valve: The aortic valve was not well visualized. Aortic valve regurgitation is trivial. Mild aortic valve sclerosis is present, with no evidence of aortic valve stenosis. Aortic valve mean gradient measures 3.0 mmHg. Aortic valve peak gradient measures 4.3 mmHg. Aortic valve area, by VTI measures 3.95 cm. Pulmonic Valve: The pulmonic valve was not well visualized. Pulmonic valve regurgitation is trivial. Aorta: Aortic dilatation noted. There is mild dilatation of the aortic root, measuring 42 mm. 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 IAS/Shunts: The interatrial septum was not well visualized.  LEFT VENTRICLE PLAX 2D LVIDd:         4.50 cm      Diastology LVIDs:         3.20 cm      LV e' medial:    5.55 cm/s LV PW:         1.50 cm      LV E/e' medial:  14.2 LV IVS:        1.80 cm      LV e' lateral:   7.83 cm/s LVOT diam:     2.40 cm      LV E/e' lateral: 10.1 LV SV:         114 LV SV Index:   52 LVOT Area:     4.52 cm  LV Volumes (MOD) LV vol d, MOD A2C: 123.0 ml LV vol d, MOD A4C: 133.0 ml LV vol s, MOD A2C: 52.4 ml LV vol s, MOD A4C: 46.9 ml LV SV MOD A2C:     70.6 ml LV SV MOD A4C:     133.0 ml LV SV MOD BP:      77.3 ml RIGHT VENTRICLE RV Basal diam:  3.00 cm RV S prime:     8.81 cm/s TAPSE (M-mode): 2.3 cm LEFT ATRIUM              Index       RIGHT ATRIUM           Index LA diam:        3.80 cm 1.72 cm/m  RA Area:     17.50 cm LA Vol (A2C):   82.1 ml 37.25 ml/m RA Volume:   43.90 ml  19.92 ml/m LA Vol (A4C):   88.8 ml 40.29 ml/m LA Biplane Vol: 91.8 ml 41.65 ml/m  AORTIC VALVE AV Area (Vmax):    3.69 cm AV Area (Vmean):   3.56 cm AV Area (VTI):     3.95 cm AV Vmax:           104.00 cm/s AV Vmean:          78.200 cm/s AV VTI:            0.290 m AV Peak Grad:      4.3 mmHg AV Mean Grad:      3.0 mmHg LVOT Vmax:         84.90 cm/s LVOT Vmean:  61.600 cm/s LVOT VTI:          0.253 m LVOT/AV VTI ratio: 0.87  AORTA Ao Root diam: 4.20 cm Ao Asc diam:  4.30 cm MITRAL VALVE               TRICUSPID VALVE MV Area (PHT): 2.83 cm    TR Peak grad:   24.0 mmHg MV Decel Time: 268 msec    TR Vmax:        245.00 cm/s MV E velocity: 78.80 cm/s MV A velocity: 74.10 cm/s  SHUNTS MV E/A ratio:  1.06        Systemic VTI:  0.25 m                            Systemic Diam: 2.40 cm Oswaldo Milian MD Electronically signed by Oswaldo Milian MD Signature Date/Time: 12/15/2019/10:13:51 AM    Final     Cardiac Studies   Echocardiogram as noted above.  Septal hypertrophy is prominent.  Patient Profile     86 y.o. male history of CVA, primary hypertension, hyperlipidemia, type 2 diabetes mellitus, hypothyroidism, stage III CKD, anemia, who presented with chest discomfort compatible with unstable angina.  Assessment & Plan    1. Unstable angina pectoris: Decided in favor of coronary angiography due to unlikelihood of being able to complete a exercise treadmill test and long first-degree AV block causes concerned about given Lexiscan.  Referral will be performed without left ventriculography, using as little contrast as possible.  Symptoms are concerning and EKG with prior evidence of inferior infarction raises concern for potentially critical multivessel disease.  I mostly want to exclude the possibility of left main disease. 2. Stage  III CKD: He has been hydrated overnight.  Nephrotoxic agents have been removed. 3. First-degree AV block with baseline sinus bradycardia      For questions or updates, please contact [place] Please consult [email or web address] for contact info under        Signed, Sinclair Grooms, MD  12/16/2019, 7:08 AM

## 2019-12-16 NOTE — Interval H&P Note (Signed)
History and Physical Interval Note:  12/16/2019 7:30 AM  Martin Richardson Samples  has presented today for surgery, with the diagnosis of nonstemi.  The various methods of treatment have been discussed with the patient and family. After consideration of risks, benefits and other options for treatment, the patient has consented to  Procedure(s): LEFT HEART CATH AND CORONARY ANGIOGRAPHY (N/A) as a surgical intervention.  The patient's history has been reviewed, patient examined, no change in status, stable for surgery.  I have reviewed the patient's chart and labs.  Questions were answered to the patient's satisfaction.   Cath Lab Visit (complete for each Cath Lab visit)  Clinical Evaluation Leading to the Procedure:   ACS: Yes.    Non-ACS:    Anginal Classification: CCS IV  Anti-ischemic medical therapy: Minimal Therapy (1 class of medications)  Non-Invasive Test Results: No non-invasive testing performed  Prior CABG: No previous CABG        Collier Salina [facility] 12/16/2019 7:30 AM

## 2019-12-17 DIAGNOSIS — I208 Other forms of angina pectoris: Secondary | ICD-10-CM

## 2019-12-17 DIAGNOSIS — Z23 Encounter for immunization: Secondary | ICD-10-CM | POA: Diagnosis not present

## 2019-12-17 DIAGNOSIS — I129 Hypertensive chronic kidney disease with stage 1 through stage 4 chronic kidney disease, or unspecified chronic kidney disease: Secondary | ICD-10-CM | POA: Diagnosis not present

## 2019-12-17 DIAGNOSIS — I200 Unstable angina: Secondary | ICD-10-CM | POA: Diagnosis not present

## 2019-12-17 DIAGNOSIS — Z20822 Contact with and (suspected) exposure to covid-19: Secondary | ICD-10-CM | POA: Diagnosis not present

## 2019-12-17 LAB — BASIC METABOLIC PANEL
Anion gap: 8 (ref 5–15)
BUN: 30 mg/dL — ABNORMAL HIGH (ref 8–23)
CO2: 22 mmol/L (ref 22–32)
Calcium: 9.3 mg/dL (ref 8.9–10.3)
Chloride: 106 mmol/L (ref 98–111)
Creatinine, Ser: 2 mg/dL — ABNORMAL HIGH (ref 0.61–1.24)
GFR, Estimated: 31 mL/min — ABNORMAL LOW (ref >60)
Glucose, Bld: 238 mg/dL — ABNORMAL HIGH (ref 70–99)
Potassium: 4.3 mmol/L (ref 3.5–5.1)
Sodium: 136 mmol/L (ref 135–145)

## 2019-12-17 LAB — GLUCOSE, CAPILLARY
Glucose-Capillary: 220 mg/dL — ABNORMAL HIGH (ref 70–99)
Glucose-Capillary: 165 mg/dL — ABNORMAL HIGH (ref 70–99)

## 2019-12-17 MED ORDER — ASPIRIN 81 MG PO CHEW
81.0000 mg | CHEWABLE_TABLET | Freq: Every day | ORAL | 1 refills | Status: DC
Start: 2019-12-17 — End: 2020-10-08

## 2019-12-17 MED ORDER — ISOSORBIDE MONONITRATE ER 30 MG PO TB24
30.0000 mg | ORAL_TABLET | Freq: Every day | ORAL | 1 refills | Status: DC
Start: 2019-12-17 — End: 2020-02-28

## 2019-12-17 MED ORDER — CARVEDILOL 3.125 MG PO TABS: 3.1250 mg | ORAL_TABLET | Freq: Two times a day (BID) | ORAL | Status: DC

## 2019-12-17 MED ORDER — AMLODIPINE BESYLATE 5 MG PO TABS
5.0000 mg | ORAL_TABLET | Freq: Every day | ORAL | 1 refills | Status: DC
Start: 2019-12-17 — End: 2020-02-28

## 2019-12-17 MED ORDER — CARVEDILOL 3.125 MG PO TABS
3.1250 mg | ORAL_TABLET | Freq: Two times a day (BID) | ORAL | 1 refills | Status: DC
Start: 2019-12-17 — End: 2020-01-04

## 2019-12-17 NOTE — Progress Notes (Signed)
Subjective:  Denies SSCP, palpitations or Dyspnea Feels he can go home today Lives alone but will stay with daughter   Objective:  Vitals:   12/16/19 0908 12/16/19 2009 12/17/19 0620 12/17/19 0920  BP: (!) 148/123 124/67 (!) 145/82 133/77  Pulse: (!) 56 (!) 53 81 71  Resp:   18 16  Temp:  98.2 F (36.8 C) 98.2 F (36.8 C) (!) 97.5 F (36.4 C)  TempSrc:  Oral Oral Oral  SpO2: 96% 98% 98%   Weight:   98.7 kg   Height:        Intake/Output from previous day:  Intake/Output Summary (Last 24 hours) at 12/17/2019 0940 Last data filed at 12/17/2019 4967 Gross per 24 hour  Intake 1892.52 ml  Output 2250 ml  Net -357.48 ml    Physical Exam: Affect appropriate Healthy:  appears stated age HEENT: normal Neck supple with no adenopathy JVP normal no bruits no thyromegaly Lungs clear with no wheezing and good diaphragmatic motion Heart:  S1/S2 SEM  murmur, no rub, gallop or click PMI normal Abdomen: benighn, BS positve, no tenderness, no AAA no bruit.  No HSM or HJR Distal pulses intact with no bruits No edema Neuro non-focal Skin warm and dry No muscular weakness Right radial cath site A  Lab Results: Basic Metabolic Panel: Recent Labs    12/15/19 0636 12/16/19 0256  NA 140 138  K 4.2 4.2  CL 110 110  CO2 23 23  GLUCOSE 153* 141*  BUN 28* 29*  CREATININE 1.91* 1.84*  CALCIUM 9.5 9.4  MG 1.9 1.9  PHOS  --  3.0   Liver Function Tests: Recent Labs    12/15/19 0636  AST 13*  ALT 14  ALKPHOS 60  BILITOT 0.6  PROT 5.5*  ALBUMIN 3.3*   No results for input(s): LIPASE, AMYLASE in the last 72 hours. CBC: Recent Labs    12/15/19 0636 12/16/19 0256  WBC 5.2 6.6  NEUTROABS 2.0  --   HGB 12.4* 12.5*  HCT 36.0* 36.2*  MCV 88.9 87.4  PLT 166 185   Cardiac Enzymes: No results for input(s): CKTOTAL, CKMB, CKMBINDEX, TROPONINI in the last 72 hours. BNP: Invalid input(s): POCBNP D-Dimer: No results for input(s): DDIMER in the last 72  hours. Hemoglobin A1C: Recent Labs    12/15/19 0638  HGBA1C 7.3*   Fasting Lipid Panel: Recent Labs    12/16/19 0256  CHOL 93  HDL 25*  LDLCALC 28  TRIG 199*  CHOLHDL 3.7   Thyroid Function Tests: Recent Labs    12/15/19 1203  TSH 1.450   Anemia Panel: No results for input(s): VITAMINB12, FOLATE, FERRITIN, TIBC, IRON, RETICCTPCT in the last 72 hours.  Imaging: CARDIAC CATHETERIZATION  Result Date: 12/16/2019  Mid LM to Dist LM lesion is 20% stenosed.  Mid LAD lesion is 50% stenosed.  Dist LAD lesion is 80% stenosed.  2nd Diag lesion is 85% stenosed.  Mid Cx lesion is 70% stenosed.  LV end diastolic pressure is normal.  1. 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 Normal LVEDP Plan: recommend medical management.    Cardiac Studies:  ECG: accelerated junctional LAD    Telemetry:  NSR rate 70's   Echo: EF 60-65% aortic slcerosis   Medications:   . amLODipine  5 mg Oral Daily  . aspirin  81 mg Oral Daily  . atorvastatin  80 mg Oral Daily  . carvedilol  3.125 mg Oral BID WC  . cholecalciferol  2,000  Units Oral Daily  . famotidine  20 mg Oral QHS  . gabapentin  200 mg Oral QHS  . heparin  5,000 Units Subcutaneous Q8H  . influenza vaccine adjuvanted  0.5 mL Intramuscular Tomorrow-1000  . insulin aspart  0-9 Units Subcutaneous TID WC  . isosorbide mononitrate  30 mg Oral Daily  . levothyroxine  125 mcg Oral QAC breakfast  . sodium chloride flush  3 mL Intravenous Q12H  . timolol  1 drop Both Eyes BID     . sodium chloride      Assessment/Plan:   1. Chest Pain:  Cath yesterday with moderate disease in circumflex distal LAD and D1 Medical Rx Add coreg and continue imdur ASA 81 mg Check BMET this am ok to d/c home if ambulates well Will arrange outpatient f/u with Dr [name]  2. Thyroid :  On replacement   3. DM:  Discussed low carb diet.  Target hemoglobin A1c is 6.5 or less.  Continue current medications.  4. HLD  Continue high dose lipitor    5. CRF:  Baseline around 1.8 repeat this am post contrast pending   Jenkins Rouge 12/17/2019, 9:40 AM

## 2019-12-17 NOTE — Discharge Instructions (Signed)
Advised to follow-up with primary care physician in 1 week Advised to follow-up with cardiology Dr. [name] is scheduled appointment been made. Patient underwent left heart cath which was mild disease advised medical management. Advised to take Imdur aspirin statins and Coreg.

## 2019-12-17 NOTE — Evaluation (Signed)
Occupational Therapy Evaluation Patient Details Name: Martin Richardson MRN: [id number] DOB: 05-Nov-1925 Today's Date: 12/17/2019    History of Present Illness Pt is a 86 y.o. male admitted 12/15/19 with chest pain. S/p radial cardiac cath on 11/19 with findings of CAD of 2 vessels; to be treated medically. PMH includes DM, HTN, CVA, CKD, neuropathy, kidney CA, rt ankle fx (07/2019).   Clinical Impression   Patient presented with the above diagnosis and subsequent procedure.  Only deficit is some mild unsteadiness with SPC.  He is having to use his left hand due to restrictions placed on his right arm per MD.  Otherwise, he is functioning at his baseline.  No further needs in the acute setting.  No HH recommended.  He will transition to his daughter's home for a few days, then return to his community.      Follow Up Recommendations  No OT follow up    Equipment Recommendations  None recommended by OT    Recommendations for Other Services       Precautions / Restrictions Precautions Precautions: Fall Restrictions Weight Bearing Restrictions: Yes RUE Weight Bearing: Non weight bearing Other Position/Activity Restrictions: MD advised as to lifting restrictions, and to limit pushing and pulling through the R arm.      Mobility Bed Mobility Overal bed mobility: Modified Independent                  Transfers Overall transfer level: Modified independent Equipment used: Straight cane                  Balance Overall balance assessment: Mild deficits observed, not formally tested                                         ADL either performed or assessed with clinical judgement   ADL Overall ADL's : At baseline                                       General ADL Comments: Able to complete grooming task, LB and UB ADL, sit/stand level with minimal setup.  Mobility with SPC.     Vision Baseline Vision/History: Wears glasses Wears  Glasses: At all times Patient Visual Report: No change from baseline       Perception     Praxis      Pertinent Vitals/Pain Pain Assessment: No/denies pain     Hand Dominance Right   Extremity/Trunk Assessment Upper Extremity Assessment Upper Extremity Assessment: Overall WFL for tasks assessed           Communication Communication Communication: No difficulties   Cognition Arousal/Alertness: Awake/alert Behavior During Therapy: WFL for tasks assessed/performed Overall Cognitive Status: Within Functional Limits for tasks assessed                                     General Comments   VSS    Exercises     Shoulder Instructions      Home Living Family/patient expects to be discharged to:: Private residence Living Arrangements: Alone Available Help at Discharge: Family;Available PRN/intermittently Type of Home: Other(Comment) Home Access: Elevator     Home Layout: One level     Bathroom Shower/Tub: Walk-in shower;Tub/shower unit;Door  Bathroom Toilet: Handicapped height     Home Equipment: Shower seat - built in;Toilet riser;Walker - 2 wheels;Cane - single point   Additional Comments: Daughter lives nearby and checks on pt      Prior Functioning/Environment Level of Independence: Independent with assistive device(s)        Comments: Independent household ambulation, uses SPC in R-hand when ambulating outside, drives and indep with ADLs. Daughter assists with some iADLs.        OT Problem List: Impaired balance (sitting and/or standing)      OT Treatment/Interventions:      OT Goals(Current goals can be found in the care plan section) Acute Rehab OT Goals Patient Stated Goal: Patient preparing for discharge this date.  Going to stay with his daughter for a few days. OT Goal Formulation: With patient Time For Goal Achievement: 12/17/19 Potential to Achieve Goals: Good  OT Frequency:     Barriers to D/C:  None noted           Co-evaluation              AM-PAC OT "6 Clicks" Daily Activity     Outcome Measure Help from another person eating meals?: None Help from another person taking care of personal grooming?: None Help from another person toileting, which includes using toliet, bedpan, or urinal?: None Help from another person bathing (including washing, rinsing, drying)?: None Help from another person to put on and taking off regular upper body clothing?: None Help from another person to put on and taking off regular lower body clothing?: None 6 Click Score: 24   End of Session Equipment Utilized During Treatment: Gait belt;Other (comment) (cane) Nurse Communication: Mobility status  Activity Tolerance: Patient tolerated treatment well Patient left: in chair;with call bell/phone within reach  OT Visit Diagnosis: Unsteadiness on feet (R26.81)                Time: 8242-3536 OT Time Calculation (min): 33 min Charges:  OT General Charges $OT Visit: 1 Visit OT Evaluation $OT Eval Moderate Complexity: 1 Mod OT Treatments $Self Care/Home Management : 8-22 mins  12/17/2019  Rich, OTR/L  Acute Rehabilitation Services  Office:  [phone]   Metta Clines 12/17/2019, 11:55 AM

## 2019-12-17 NOTE — Discharge Summary (Signed)
Physician Discharge Summary  Martin Richardson RXV:[id number] DOB: 07-28-1925 DOA: 12/14/2019  PCP: [name]  Admit date: 12/14/2019   Discharge date: 12/17/2019  Admitted From: Home.  Disposition:  Home.  Recommendations for Outpatient Follow-up:  1. Follow up with PCP in 3-4 days. 2. Please obtain BMP/CBC in one week. 3. Advised to follow-up with cardiology Dr. [name] as scheduled. Appointment been made. 4. Patient underwent left heart cath which was mild disease,  advised medical management. 5. Advised to take Imdur,  Aspirin,, statins and Coreg. 6. Please recheck BMP in 3 to 4 days.  Home Health:None. Equipment/Devices: None.  Discharge Condition: Stable CODE STATUS:Full code Diet recommendation: Heart Healthy   Brief Summary / Hospital course: This 86 yrs old male with medical history significant for type 2 diabetes, hypertension, CVA, CKD stage IIIb/ IV presents in the ED with sudden onset of chest tightness [place].Marland KitchenHe was admitted for chest pain to rule out ACS. EKG demonstrates old infarct. Troponins trending up. Cardiology consulted,advised to continue aspirin,statins. Patient underwent left heart cath on 12/16/19. 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  Cardiology recommend medical management. Patient is cleared from cardiology to be discharged.  Patient is started on Imdur 30 mg daily,  Coreg 3.125 mg twice daily in addition to aspirin, Lipitor.  There is a slight increase in serum creatinine from 1.9-2.0 post left heart cath.  We will request PCP to check BMP in 3 to 4 days.  Patient has ambulated well without any dizziness, chest pain or shortness of breath.  Patient is being discharged home.  He was managed for below problems  Discharge Diagnoses:  Active Problems:   Chest pain   Stage 3b chronic kidney disease (HCC)   Unstable angina  (HCC)  Atypical chest pain rule out ACS Presented with sudden onset chest tightness while at rest, associated with nonexertional dyspnea. Chest pain resolved by time of this  ED visit. Initial troponin25 uptrending to 33>> 112>> 158. No evidence of acute ischemia on twelve-lead EKG Troponin trending up. Monitor on telemetry Cardiology consulted, thinks patient is having unstable angina. Recommended left heart cath Patient underwent left heart cath found to have two-vessel obstructive CAD without critical stenosis. Cardiology recommended medical management.  Imdur added 30 mg daily  Type 2 diabetes with hyperglycemia Hemoglobin A1c 7.3 Start insulin sliding scale Hold off home oral hypoglycemics  Sinus bradycardia >>> Improved. Heart rate in the 50s. TSH normal Continue to monitor  Hypothyroidism TSH normal Resume home levothyroxine  Essential hypertension BP is controlled. Resume home oral antihypertensive  History of prior CVA Continue home statin  CKD 3B Appears to be at baseline Avoid nephrotoxins Gentle IV fluid hydration normal saline at 50 cc/h x 2 days Monitor urine output Repeat renal panel in the morning   Discharge Instructions  Discharge Instructions    Call MD for:  difficulty breathing, headache or visual disturbances   Complete by: As directed    Call MD for:  persistant dizziness or light-headedness   Complete by: As directed    Call MD for:  persistant nausea and vomiting   Complete by: As directed    Call MD for:  temperature >100.4   Complete by: As directed    Diet - low sodium heart healthy   Complete by: As directed    Diet Carb Modified   Complete by: As directed  Discharge instructions   Complete by: As directed    Advised to follow-up with primary care physician in 1 week Advised to follow-up with cardiology Dr. [name] is scheduled appointment been made. Patient underwent left heart cath which was mild disease advised  medical management. Advised to take Imdur aspirin statins and Coreg.   Increase activity slowly   Complete by: As directed    No wound care   Complete by: As directed      Allergies as of 12/17/2019      Reactions   Adhesive [tape] Rash      Medication List    STOP taking these medications   enoxaparin 30 MG/0.3ML injection Commonly known as: LOVENOX   insulin aspart 100 UNIT/ML FlexPen Commonly known as: NOVOLOG   sorbitol 70 % Soln     TAKE these medications   acetaminophen 325 MG tablet Commonly known as: TYLENOL Take 650 mg by mouth every 4 (four) hours as needed for mild pain or headache.   amLODipine 5 MG tablet Commonly known as: NORVASC Take 1 tablet (5 mg total) by mouth daily.   aspirin 81 MG chewable tablet Chew 1 tablet (81 mg total) by mouth daily.   atorvastatin 80 MG tablet Commonly known as: LIPITOR Take 80 mg by mouth daily.   betamethasone valerate 0.1 % cream Commonly known as: VALISONE Apply 1 application topically every Monday, Wednesday, and Friday. For psoriasis   carvedilol 3.125 MG tablet Commonly known as: COREG Take 1 tablet (3.125 mg total) by mouth 2 (two) times daily with a meal.   docusate sodium 100 MG capsule Commonly known as: COLACE Take 100 mg by mouth 2 (two) times daily.   famotidine 20 MG tablet Commonly known as: PEPCID Take 20 mg by mouth at bedtime.   gabapentin 100 MG capsule Commonly known as: NEURONTIN Take 2 capsules (200 mg total) by mouth at bedtime. What changed: how much to take   glimepiride 4 MG tablet Commonly known as: AMARYL Take 4 mg by mouth daily with breakfast.   insulin glargine 100 UNIT/ML injection Commonly known as: LANTUS Inject 0.2 mLs (20 Units total) into the skin 2 (two) times daily. What changed:   how much to take  when to take this   Insulin Pen Needle 29G X 12MM Misc Use as directed   isosorbide mononitrate 30 MG 24 hr tablet Commonly known as: IMDUR Take 1 tablet  (30 mg total) by mouth daily.   levothyroxine 125 MCG tablet Commonly known as: SYNTHROID Take 125 mcg by mouth daily before breakfast.   polyethylene glycol 17 g packet Commonly known as: MIRALAX / GLYCOLAX Take 17 g by mouth 2 (two) times daily.   senna 8.6 MG Tabs tablet Commonly known as: SENOKOT Take 1 tablet (8.6 mg total) by mouth daily. What changed:   when to take this  reasons to take this   timolol 0.5 % ophthalmic solution Commonly known as: BETIMOL Place 1 drop into both eyes 2 (two) times daily.   VITAMIN D2 PO Take 1,000 Units by mouth daily.       Follow-up Information    [place], Crista [place], [place] Follow up on 01/04/2020.   Specialty: Cardiology Why: 8:45 am Contact information: [place] [address] [phone]        [place] [place] Follow up in 1 week(s).   Specialty: Family Medicine Contact information: [place] [address] [phone]  Belva Crome, MD .   Specialty: Cardiology Contact information: [phone] [place]  [address] [phone]              Allergies  Allergen Reactions  . Adhesive [Tape] Rash    Consultations:  Cardiology   Procedures/Studies: CARDIAC CATHETERIZATION  Result Date: 12/16/2019  Mid LM to Dist LM lesion is 20% stenosed.  Mid LAD lesion is 50% stenosed.  Dist LAD lesion is 80% stenosed.  2nd Diag lesion is 85% stenosed.  Mid Cx lesion is 70% stenosed.  LV end diastolic pressure is normal.  1. 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 Normal LVEDP Plan: recommend medical management.   DG Chest Portable 1 View  Result Date: 12/14/2019 CLINICAL DATA:  Chest pain and shortness of breath.  COVID exposure. EXAM: PORTABLE CHEST 1 VIEW COMPARISON:  Radiographs 08/12/2019 FINDINGS: 2155 hours. The heart size and mediastinal contours are stable. Patchy bibasilar  opacities are most consistent with atelectasis. There is no confluent airspace opacity, pleural effusion or pneumothorax. Surgical clips are noted at the thoracic inlet on the left. No acute osseous findings. IMPRESSION: Probable bibasilar atelectasis. No consolidation to suggest pneumonia. Electronically Signed   By: Richardean Sale M.D.   On: 12/14/2019 22:07   ECHOCARDIOGRAM COMPLETE  Result Date: 12/15/2019    ECHOCARDIOGRAM REPORT   Patient Name:   LAWSON MAHONE Akram Date of Exam: 12/15/2019 Medical Rec #:  [id number]         Height:       71.0 in Accession #:    [id number]        Weight:       222.0 lb Date of Birth:  Jun 06, 1925         BSA:          2.204 m Patient Age:    89 years          BP:           168/82 mmHg Patient Gender: M                 HR:           57 bpm. Exam Location:  Inpatient Procedure: 2D Echo, Cardiac Doppler, Color Doppler and Intracardiac            Opacification Agent Indications:    Elevated troponin  History:        Patient has no prior history of Echocardiogram examinations.                 Risk Factors:Diabetes and Hypertension. H/O CVA. CKD. DOE.  Sonographer:    Clayton Lefort RDCS (AE) Referring Phys: [id number] [facility]  Sonographer Comments: Suboptimal subcostal window and patient is morbidly obese. IMPRESSIONS  1. Left ventricular ejection fraction, by estimation, is 60 to 65%. The left ventricle has normal function. The left ventricle has no regional wall motion abnormalities. There is severe asymmetric left ventricular hypertrophy of the basal-septal segment. Left ventricular diastolic parameters are indeterminate.  2. Right ventricular systolic function is mildly reduced. The right ventricular size is normal.  3. Left atrial size was moderately dilated.  4. The mitral valve is normal in structure. No evidence of mitral valve regurgitation. No evidence of mitral stenosis.  5. The aortic valve was not well visualized. Aortic valve regurgitation is trivial. Mild aortic  valve sclerosis is present, with no evidence of aortic valve stenosis.  6. Aortic dilatation noted. There is mild dilatation of  the aortic root, measuring 42 mm. 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 FINDINGS  Left Ventricle: Left ventricular ejection fraction, by estimation, is 60 to 65%. The left ventricle has normal function. The left ventricle has no regional wall motion abnormalities. Definity contrast agent was given IV to delineate the left ventricular  endocardial borders. The left ventricular internal cavity size was normal in size. There is severe asymmetric left ventricular hypertrophy of the basal-septal segment. Left ventricular diastolic parameters are indeterminate. Right Ventricle: The right ventricular size is normal. Right vetricular wall thickness was not assessed. Right ventricular systolic function is mildly reduced. Left Atrium: Left atrial size was moderately dilated. Right Atrium: Right atrial size was normal in size. Pericardium: There is no evidence of pericardial effusion. Mitral Valve: The mitral valve is normal in structure. No evidence of mitral valve regurgitation. No evidence of mitral valve stenosis. Tricuspid Valve: The tricuspid valve is normal in structure. Tricuspid valve regurgitation is trivial. Aortic Valve: The aortic valve was not well visualized. Aortic valve regurgitation is trivial. Mild aortic valve sclerosis is present, with no evidence of aortic valve stenosis. Aortic valve mean gradient measures 3.0 mmHg. Aortic valve peak gradient measures 4.3 mmHg. Aortic valve area, by VTI measures 3.95 cm. Pulmonic Valve: The pulmonic valve was not well visualized. Pulmonic valve regurgitation is trivial. Aorta: Aortic dilatation noted. There is mild dilatation of the aortic root, measuring 42 mm. 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 IAS/Shunts: The interatrial septum was not well visualized.  LEFT VENTRICLE PLAX 2D LVIDd:          4.50 cm      Diastology LVIDs:         3.20 cm      LV e' medial:    5.55 cm/s LV PW:         1.50 cm      LV E/e' medial:  14.2 LV IVS:        1.80 cm      LV e' lateral:   7.83 cm/s LVOT diam:     2.40 cm      LV E/e' lateral: 10.1 LV SV:         114 LV SV Index:   52 LVOT Area:     4.52 cm  LV Volumes (MOD) LV vol d, MOD A2C: 123.0 ml LV vol d, MOD A4C: 133.0 ml LV vol s, MOD A2C: 52.4 ml LV vol s, MOD A4C: 46.9 ml LV SV MOD A2C:     70.6 ml LV SV MOD A4C:     133.0 ml LV SV MOD BP:      77.3 ml RIGHT VENTRICLE RV Basal diam:  3.00 cm RV S prime:     8.81 cm/s TAPSE (M-mode): 2.3 cm LEFT ATRIUM             Index       RIGHT ATRIUM           Index LA diam:        3.80 cm 1.72 cm/m  RA Area:     17.50 cm LA Vol (A2C):   82.1 ml 37.25 ml/m RA Volume:   43.90 ml  19.92 ml/m LA Vol (A4C):   88.8 ml 40.29 ml/m LA Biplane Vol: 91.8 ml 41.65 ml/m  AORTIC VALVE AV Area (Vmax):    3.69 cm AV Area (Vmean):   3.56 cm AV Area (VTI):     3.95 cm AV  Vmax:           104.00 cm/s AV Vmean:          78.200 cm/s AV VTI:            0.290 m AV Peak Grad:      4.3 mmHg AV Mean Grad:      3.0 mmHg LVOT Vmax:         84.90 cm/s LVOT Vmean:        61.600 cm/s LVOT VTI:          0.253 m LVOT/AV VTI ratio: 0.87  AORTA Ao Root diam: 4.20 cm Ao Asc diam:  4.30 cm MITRAL VALVE               TRICUSPID VALVE MV Area (PHT): 2.83 cm    TR Peak grad:   24.0 mmHg MV Decel Time: 268 msec    TR Vmax:        245.00 cm/s MV E velocity: 78.80 cm/s MV A velocity: 74.10 cm/s  SHUNTS MV E/A ratio:  1.06        Systemic VTI:  0.25 m                            Systemic Diam: 2.40 cm Oswaldo Milian MD Electronically signed by Oswaldo Milian MD Signature Date/Time: 12/15/2019/10:13:51 AM    Final     Left heart cath   Subjective: Patient was seen and examined at bedside.  Overnight events noted.  Patient reports feeling much better,  Patient has ambulated well in the hallway without any dizziness,  chest pain.  Patient wants to be  discharged home.  Discharge Exam: Vitals:   12/17/19 0620 12/17/19 0920  BP: (!) 145/82 133/77  Pulse: 81 71  Resp: 18 16  Temp: 98.2 F (36.8 C) (!) 97.5 F (36.4 C)  SpO2: 98%    Vitals:   12/16/19 0908 12/16/19 2009 12/17/19 0620 12/17/19 0920  BP: (!) 148/123 124/67 (!) 145/82 133/77  Pulse: (!) 56 (!) 53 81 71  Resp:   18 16  Temp:  98.2 F (36.8 C) 98.2 F (36.8 C) (!) 97.5 F (36.4 C)  TempSrc:  Oral Oral Oral  SpO2: 96% 98% 98%   Weight:   98.7 kg   Height:        General: Pt is alert, awake, not in acute distress Cardiovascular: RRR, S1/S2 +, no rubs, no gallops Respiratory: CTA bilaterally, no wheezing, no rhonchi Abdominal: Soft, NT, ND, bowel sounds + Extremities: no edema, no cyanosis    The results of significant diagnostics from this hospitalization (including imaging, microbiology, ancillary and laboratory) are listed below for reference.     Microbiology: Recent Results (from the past 240 hour(s))  Respiratory Panel by RT PCR (Flu A&B, Covid) - Nasopharyngeal Swab     Status: None   Collection Time: 12/14/19 10:27 PM   Specimen: Nasopharyngeal Swab  Result Value Ref Range Status   SARS Coronavirus 2 by RT PCR NEGATIVE NEGATIVE Final    Comment: (NOTE) SARS-CoV-2 target nucleic acids are NOT DETECTED.  The SARS-CoV-2 RNA is generally detectable in upper respiratoy specimens during the acute phase of infection. The lowest concentration of SARS-CoV-2 viral copies this assay can detect is 131 copies/mL. A negative result does not preclude SARS-Cov-2 infection and should not be used as the sole basis for treatment or other patient management decisions. A negative result may occur with  improper specimen collection/handling, submission of specimen other than nasopharyngeal swab, presence of viral mutation(s) within the areas targeted by this assay, and inadequate number of viral copies (<131 copies/mL). A negative result must be combined with  clinical observations, patient history, and epidemiological information. The expected result is Negative.  Fact Sheet for Patients:  PinkCheek.be  Fact Sheet for Healthcare Providers:  GravelBags.it  This test is no t yet approved or cleared by the [place] FDA and  has been authorized for detection and/or diagnosis of SARS-CoV-2 by FDA under an Emergency Use Authorization (EUA). This EUA will remain  in effect (meaning this test can be used) for the duration of the COVID-19 declaration under Section 564(b)(1) of the Act, 21 U.S.C. section 360bbb-3(b)(1), unless the authorization is terminated or revoked sooner.     Influenza A by PCR NEGATIVE NEGATIVE Final   Influenza B by PCR NEGATIVE NEGATIVE Final    Comment: (NOTE) The Xpert Xpress SARS-CoV-2/FLU/RSV assay is intended as an aid in  the diagnosis of influenza from Nasopharyngeal swab specimens and  should not be used as a sole basis for treatment. Nasal washings and  aspirates are unacceptable for Xpert Xpress SARS-CoV-2/FLU/RSV  testing.  Fact Sheet for Patients: PinkCheek.be  Fact Sheet for Healthcare Providers: GravelBags.it  This test is not yet approved or cleared by the [place] FDA and  has been authorized for detection and/or diagnosis of SARS-CoV-2 by  FDA under an Emergency Use Authorization (EUA). This EUA will remain  in effect (meaning this test can be used) for the duration of the  Covid-19 declaration under Section 564(b)(1) of the Act, 21  U.S.C. section 360bbb-3(b)(1), unless the authorization is  terminated or revoked. Performed at [facility], [place] [address], [place], [place] [address]      Labs: BNP (last 3 results) Recent Labs    12/15/19 0124  BNP 10.2   Basic Metabolic Panel: Recent Labs  Lab 12/14/19 2148 12/15/19 0636 12/16/19 0256  NA 136 140 138   K 4.2 4.2 4.2  CL 108 110 110  CO2 19* 23 23  GLUCOSE 278* 153* 141*  BUN 30* 28* 29*  CREATININE 1.97* 1.91* 1.84*  CALCIUM 9.6 9.5 9.4  MG  --  1.9 1.9  PHOS  --   --  3.0   Liver Function Tests: Recent Labs  Lab 12/15/19 0636  AST 13*  ALT 14  ALKPHOS 60  BILITOT 0.6  PROT 5.5*  ALBUMIN 3.3*   No results for input(s): LIPASE, AMYLASE in the last 168 hours. No results for input(s): AMMONIA in the last 168 hours. CBC: Recent Labs  Lab 12/14/19 2148 12/15/19 0636 12/16/19 0256  WBC 6.3 5.2 6.6  NEUTROABS  --  2.0  --   HGB 12.9* 12.4* 12.5*  HCT 37.6* 36.0* 36.2*  MCV 87.0 88.9 87.4  PLT 185 166 185   Cardiac Enzymes: No results for input(s): CKTOTAL, CKMB, CKMBINDEX, TROPONINI in the last 168 hours. BNP: Invalid input(s): POCBNP CBG: Recent Labs  Lab 12/16/19 1020 12/16/19 1224 12/16/19 1827 12/16/19 2010 12/17/19 0734  GLUCAP 134* 194* 218* 203* 220*   D-Dimer No results for input(s): DDIMER in the last 72 hours. Hgb A1c Recent Labs    12/15/19 0638  HGBA1C 7.3*   Lipid Profile Recent Labs    12/16/19 0256  CHOL 93  HDL 25*  LDLCALC 28  TRIG 199*  CHOLHDL 3.7   Thyroid function studies Recent Labs    12/15/19  1203  TSH 1.450   Anemia work up No results for input(s): VITAMINB12, FOLATE, FERRITIN, TIBC, IRON, RETICCTPCT in the last 72 hours. Urinalysis    Component Value Date/Time   COLORURINE YELLOW 08/14/2019 2032   APPEARANCEUR CLEAR 08/14/2019 2032   LABSPEC 1.017 08/14/2019 2032   PHURINE 5.0 08/14/2019 2032   GLUCOSEU >=500 (A) 08/14/2019 2032   HGBUR NEGATIVE 08/14/2019 2032   BILIRUBINUR NEGATIVE 08/14/2019 2032   [place] NEGATIVE 08/14/2019 2032   PROTEINUR 30 (A) 08/14/2019 2032   NITRITE NEGATIVE 08/14/2019 2032   LEUKOCYTESUR NEGATIVE 08/14/2019 2032   Sepsis Labs Invalid input(s): PROCALCITONIN,  WBC,  LACTICIDVEN Microbiology Recent Results (from the past 240 hour(s))  Respiratory Panel by RT PCR (Flu  A&B, Covid) - Nasopharyngeal Swab     Status: None   Collection Time: 12/14/19 10:27 PM   Specimen: Nasopharyngeal Swab  Result Value Ref Range Status   SARS Coronavirus 2 by RT PCR NEGATIVE NEGATIVE Final    Comment: (NOTE) SARS-CoV-2 target nucleic acids are NOT DETECTED.  The SARS-CoV-2 RNA is generally detectable in upper respiratoy specimens during the acute phase of infection. The lowest concentration of SARS-CoV-2 viral copies this assay can detect is 131 copies/mL. A negative result does not preclude SARS-Cov-2 infection and should not be used as the sole basis for treatment or other patient management decisions. A negative result may occur with  improper specimen collection/handling, submission of specimen other than nasopharyngeal swab, presence of viral mutation(s) within the areas targeted by this assay, and inadequate number of viral copies (<131 copies/mL). A negative result must be combined with clinical observations, patient history, and epidemiological information. The expected result is Negative.  Fact Sheet for Patients:  PinkCheek.be  Fact Sheet for Healthcare Providers:  GravelBags.it  This test is no t yet approved or cleared by the [place] FDA and  has been authorized for detection and/or diagnosis of SARS-CoV-2 by FDA under an Emergency Use Authorization (EUA). This EUA will remain  in effect (meaning this test can be used) for the duration of the COVID-19 declaration under Section 564(b)(1) of the Act, 21 U.S.C. section 360bbb-3(b)(1), unless the authorization is terminated or revoked sooner.     Influenza A by PCR NEGATIVE NEGATIVE Final   Influenza B by PCR NEGATIVE NEGATIVE Final    Comment: (NOTE) The Xpert Xpress SARS-CoV-2/FLU/RSV assay is intended as an aid in  the diagnosis of influenza from Nasopharyngeal swab specimens and  should not be used as a sole basis for treatment. Nasal  washings and  aspirates are unacceptable for Xpert Xpress SARS-CoV-2/FLU/RSV  testing.  Fact Sheet for Patients: PinkCheek.be  Fact Sheet for Healthcare Providers: GravelBags.it  This test is not yet approved or cleared by the [place] FDA and  has been authorized for detection and/or diagnosis of SARS-CoV-2 by  FDA under an Emergency Use Authorization (EUA). This EUA will remain  in effect (meaning this test can be used) for the duration of the  Covid-19 declaration under Section 564(b)(1) of the Act, 21  U.S.C. section 360bbb-3(b)(1), unless the authorization is  terminated or revoked. Performed at [facility], [place] [address] [place], [place] [address]      Time coordinating discharge: Over 30 minutes  SIGNED:   Shawna Clamp, MD  [facility] 12/17/2019, 10:39 AM Pager   If 7PM-7AM, please contact night-coverage [email or web address]

## 2020-01-04 ENCOUNTER — Encounter: Payer: Self-pay | Admitting: Physician Assistant

## 2020-01-04 ENCOUNTER — Ambulatory Visit: Payer: Medicare Other | Admitting: Physician Assistant

## 2020-01-04 ENCOUNTER — Other Ambulatory Visit: Payer: Self-pay

## 2020-01-04 VITALS — BP 140/70 | HR 82 | Ht 71.0 in | Wt 226.0 lb

## 2020-01-04 DIAGNOSIS — I10 Essential (primary) hypertension: Secondary | ICD-10-CM | POA: Diagnosis not present

## 2020-01-04 DIAGNOSIS — I2510 Atherosclerotic heart disease of native coronary artery without angina pectoris: Secondary | ICD-10-CM | POA: Diagnosis not present

## 2020-01-04 DIAGNOSIS — R42 Dizziness and giddiness: Secondary | ICD-10-CM

## 2020-01-04 DIAGNOSIS — N184 Chronic kidney disease, stage 4 (severe): Secondary | ICD-10-CM

## 2020-01-04 DIAGNOSIS — R001 Bradycardia, unspecified: Secondary | ICD-10-CM | POA: Diagnosis not present

## 2020-01-04 NOTE — Progress Notes (Signed)
Cardiology Office Note:    Date:  01/04/2020   ID:  Martin Richardson, DOB 06-Apr-1925, MRN [id number]  PCP:  Pa, [place] Cardiologist:  Sinclair Grooms, MD  [place] Electrophysiologist:  None   Chief Complaint: Hospital follow up   History of Present Illness:    Martin Richardson is a 86 y.o. male with a hx of with a history of CVA, hypertension, hyperlipidemia, type 2 diabetes mellitus, hypothyroidism, CKD stage III-IV, and anemia presented for hospital follow up.   Recently admitted 11/2019 for chest pain. No prior cardiac hx.  Symptoms are concerning and EKG with prior evidence of inferior infarction. Echo with LVEF of 60-65%, no wm abnormality. Cath showed 2 vessel obstructive CAD without critical stenosis. Recommended medical therapy. Had sinus bradycardia without significant pause.   Here today for follow up with daughter.  Patient reported intermittent dizziness.  1 time daughter noted blood pressure of 90/70 heart rate 56.  He especially feels dizzy while laying down.  He denies associated chest pain, shortness of breath, palpitation or syncope.  He also had episode of dizziness while attending church for 20 minutes.  He was sitting down.  Felt better after bowel movement.  Reports compliance with his medication.  Past Medical History:  Diagnosis Date  . Arthritis   . Blind left eye   . Cancer of left kidney ([place])   . Cataract   . Chronic kidney disease   . Constipation   . Diabetes mellitus without complication ([place])   . Glaucoma   . Hypertension   . Neuropathy   . Stroke [facility])     Past Surgical History:  Procedure Laterality Date  . CHOLECYSTECTOMY    . LEFT HEART CATH AND CORONARY ANGIOGRAPHY N/A 12/16/2019   Procedure: LEFT HEART CATH AND CORONARY ANGIOGRAPHY;  Surgeon: Martinique, Peter M, MD;  Location: [place] [facility];  Service: Cardiovascular;  Laterality: N/A;  . ORIF ANKLE FRACTURE Right 08/13/2019   Procedure:  OPEN REDUCTION INTERNAL FIXATION (ORIF) ANKLE FRACTURE;  Surgeon: Renette Butters, MD;  Location: [place];  Service: Orthopedics;  Laterality: Right;  . THYROID SURGERY    . TONSILLECTOMY      Current Medications: Current Meds  Medication Sig  . acetaminophen (TYLENOL) 325 MG tablet Take 650 mg by mouth every 4 (four) hours as needed for mild pain or headache.  Marland Kitchen amLODipine (NORVASC) 5 MG tablet Take 1 tablet (5 mg total) by mouth daily.  Marland Kitchen aspirin 81 MG chewable tablet Chew 1 tablet (81 mg total) by mouth daily.  Marland Kitchen atorvastatin (LIPITOR) 80 MG tablet Take 80 mg by mouth daily.  . betamethasone valerate (VALISONE) 0.1 % cream Apply 1 application topically every Monday, Wednesday, and Friday. For psoriasis  . docusate sodium (COLACE) 100 MG capsule Take 100 mg by mouth 2 (two) times daily.  . Ergocalciferol (VITAMIN D2 PO) Take 1,000 Units by mouth daily.   . famotidine (PEPCID) 20 MG tablet Take 20 mg by mouth at bedtime.  . gabapentin (NEURONTIN) 100 MG capsule Take 2 capsules (200 mg total) by mouth at bedtime. (Patient taking differently: Take 100 mg by mouth at bedtime. )  . glimepiride (AMARYL) 4 MG tablet Take 4 mg by mouth in the morning and at bedtime.   . insulin glargine (LANTUS) 100 UNIT/ML injection Inject 0.2 mLs (20 Units total) into the skin 2 (two) times daily. (Patient taking differently: Inject 40 Units into the skin daily. )  .  Insulin Pen Needle 29G X 12MM MISC Use as directed  . isosorbide mononitrate (IMDUR) 30 MG 24 hr tablet Take 1 tablet (30 mg total) by mouth daily.  Marland Kitchen levothyroxine (SYNTHROID) 125 MCG tablet Take 125 mcg by mouth daily before breakfast.  . polyethylene glycol (MIRALAX / GLYCOLAX) 17 g packet Take 17 g by mouth 2 (two) times daily.  Marland Kitchen senna (SENOKOT) 8.6 MG TABS tablet Take 1 tablet (8.6 mg total) by mouth daily. (Patient taking differently: Take 1 tablet by mouth at bedtime as needed for mild constipation. )  . timolol (BETIMOL) 0.5 % ophthalmic  solution Place 1 drop into both eyes 2 (two) times daily.  . [DISCONTINUED] carvedilol (COREG) 3.125 MG tablet Take 1 tablet (3.125 mg total) by mouth 2 (two) times daily with a meal.     Allergies:   Adhesive [tape]   Social History   Socioeconomic History  . Marital status: Widowed    Spouse name: Not on file  . Number of children: Not on file  . Years of education: Not on file  . Highest education level: Not on file  Occupational History  . Not on file  Tobacco Use  . Smoking status: Former Research scientist (life sciences)  . Smokeless tobacco: Former Network engineer and Sexual Activity  . Alcohol use: Never  . Drug use: Never  . Sexual activity: Not on file  Other Topics Concern  . Not on file  Social History Narrative  . Not on file   Social Determinants of Health   Financial Resource Strain:   . Difficulty of Paying Living Expenses: Not on file  Food Insecurity:   . Worried About Charity fundraiser in the Last Year: Not on file  . Ran Out of Food in the Last Year: Not on file  Transportation Needs:   . Lack of Transportation (Medical): Not on file  . Lack of Transportation (Non-Medical): Not on file  Physical Activity:   . Days of Exercise per Week: Not on file  . Minutes of Exercise per Session: Not on file  Stress:   . Feeling of Stress : Not on file  Social Connections:   . Frequency of Communication with Friends and Family: Not on file  . Frequency of Social Gatherings with Friends and Family: Not on file  . Attends Religious Services: Not on file  . Active Member of Clubs or Organizations: Not on file  . Attends Archivist Meetings: Not on file  . Marital Status: Not on file     Family History: The patient's family history includes Diabetes Mellitus II in his mother.    ROS:   Please see the history of present illness.    All other systems reviewed and are negative.   EKGs/Labs/Other Studies Reviewed:    The following studies were reviewed today:  Echo  12/15/2019 1. Left ventricular ejection fraction, by estimation, is 60 to 65%. The  left ventricle has normal function. The left ventricle has no regional  wall motion abnormalities. There is severe asymmetric left ventricular  hypertrophy of the basal-septal  segment. Left ventricular diastolic parameters are indeterminate.  2. Right ventricular systolic function is mildly reduced. The right  ventricular size is normal.  3. Left atrial size was moderately dilated.  4. The mitral valve is normal in structure. No evidence of mitral valve  regurgitation. No evidence of mitral stenosis.  5. The aortic valve was not well visualized. Aortic valve regurgitation  is trivial. Mild aortic valve  sclerosis is present, with no evidence of  aortic valve stenosis.  6. Aortic dilatation noted. There is mild dilatation of the aortic root,  measuring 42 mm. There is dilatation of the ascending aorta, measuring 43  mm.   LEFT HEART CATH AND CORONARY ANGIOGRAPHY 12/16/2019  Conclusion    Mid LM to Dist LM lesion is 20% stenosed.  Mid LAD lesion is 50% stenosed.  Dist LAD lesion is 80% stenosed.  2nd Diag lesion is 85% stenosed.  Mid Cx lesion is 70% stenosed.  LV end diastolic pressure is normal.   1. 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 Normal LVEDP  Plan: recommend medical management   EKG:  EKG is not ordered today.    Recent Labs: 12/15/2019: ALT 14; B Natriuretic Peptide 83.1; TSH 1.450 12/16/2019: Hemoglobin 12.5; Magnesium 1.9; Platelets 185 12/17/2019: BUN 30; Creatinine, Ser 2.00; Potassium 4.3; Sodium 136  Recent Lipid Panel    Component Value Date/Time   CHOL 93 12/16/2019 0256   TRIG 199 (H) 12/16/2019 0256   HDL 25 (L) 12/16/2019 0256   CHOLHDL 3.7 12/16/2019 0256   VLDL 40 12/16/2019 0256   LDLCALC 28 12/16/2019 0256     Physical Exam:    VS:  BP 140/70   Pulse 82   Ht 5\' 11"  (1.803 m)   Wt 226 lb (102.5 kg)   SpO2 98%   BMI 31.52  kg/m     Wt Readings from Last 3 Encounters:  01/04/20 226 lb (102.5 kg)  12/17/19 217 lb 8 oz (98.7 kg)  08/22/19 (!) 222 lb 7.1 oz (100.9 kg)     GEN: Well nourished, well developed in no acute distress HEENT: Normal NECK: No JVD; No carotid bruits LYMPHATICS: No lymphadenopathy CARDIAC: RRR, no murmurs, rubs, gallops RESPIRATORY:  Clear to auscultation without rales, wheezing or rhonchi  ABDOMEN: Soft, non-tender, non-distended MUSCULOSKELETAL: Right lower extremity edema; No deformity  SKIN: Warm and dry NEUROLOGIC:  Alert and oriented x 3 PSYCHIATRIC:  Normal affect   ASSESSMENT AND PLAN:    1. CAD with moderate disease - Cath as above>> recommended medical therapy.  No chest pain or shortness of breath.  Continue aspirin, Imdur and statin.  2.  Dizziness Orthostatic VS for the past 24 hrs:  BP- Lying Pulse- Lying BP- Sitting Pulse- Sitting BP- Standing at 0 minutes Pulse- Standing at 0 minutes  01/04/20 0906 130/70 52 114/68 62 133/74 61   -Patient is not orthostatic as above however systolic blood pressure did drop from 130 >>>114 from laying to sitting.  He did feel woozy but his feeling was different than prior episode.  I have reviewed differentials of orthostasis versus hypotension versus bradycardia versus hypoglycemia in detail.  After long discussion (about 30 minutes) recommended stop Coreg to prevent bradycardia as well as hypotension.  He will keep a log of his symptoms and vitals at same time.  No improvement, will consider monitor  3. HLD -Continue statin  4. DM -Per PCP  5. HTN -Medication changes as above  5. CKD III-IV - Renal function was stable at discharge  Medication Adjustments/Labs and Tests Ordered: Current medicines are reviewed at length with the patient today.  Concerns regarding medicines are outlined above.  No orders of the defined types were placed in this encounter.  No orders of the defined types were placed in this  encounter.   Patient Instructions  Medication Instructions:  Your physician has recommended you make the following change in your medication:  1.  STOP Carvedilol (Coreg)   *If you need a refill on your cardiac medications before your next appointment, please call your pharmacy*   Lab Work: None ordered  If you have labs (blood work) drawn today and your tests are completely normal, you will receive your results only by: Marland Kitchen MyChart Message (if you have MyChart) OR . A paper copy in the mail If you have any lab test that is abnormal or we need to change your treatment, we will call you to review the results.   Testing/Procedures: None ordered   Follow-Up: At [facility], you and your health needs are our priority.  As part of our continuing mission to provide you with exceptional heart care, we have created designated Provider Care Teams.  These Care Teams include your primary Cardiologist (physician) and Advanced Practice Providers (APPs -  Physician Assistants and Nurse Practitioners) who all work together to provide you with the care you need, when you need it.  We recommend signing up for the patient portal called "MyChart".  Sign up information is provided on this After Visit Summary.  MyChart is used to connect with patients for Virtual Visits (Telemedicine).  Patients are able to view lab/test results, encounter notes, upcoming appointments, etc.  Non-urgent messages can be sent to your provider as well.   To learn more about what you can do with MyChart, go to [email or web address].    Your next appointment:   1 month(s)  02/09/20 ARRIVE AT 9:00   The format for your next appointment:   In Person  Provider:   You may see Sinclair Grooms, MD or one of the following Advanced Practice Providers on your designated Care Team:    Truitt Merle, NP  Cecilie Kicks, NP  Kathyrn Drown, NP    Other Instructions  Keep a log of your blood pressure and heart rate.  Call  the office with these readings in 10 days.  [phone]    SignedLeanor Kail, PA  01/04/2020 9:28 AM    [facility]

## 2020-01-04 NOTE — Patient Instructions (Addendum)
Medication Instructions:  Your physician has recommended you make the following change in your medication:  1.  STOP Carvedilol (Coreg)   *If you need a refill on your cardiac medications before your next appointment, please call your pharmacy*   Lab Work: None ordered  If you have labs (blood work) drawn today and your tests are completely normal, you will receive your results only by: Marland Kitchen MyChart Message (if you have MyChart) OR . A paper copy in the mail If you have any lab test that is abnormal or we need to change your treatment, we will call you to review the results.   Testing/Procedures: None ordered   Follow-Up: At [facility], you and your health needs are our priority.  As part of our continuing mission to provide you with exceptional heart care, we have created designated Provider Care Teams.  These Care Teams include your primary Cardiologist (physician) and Advanced Practice Providers (APPs -  Physician Assistants and Nurse Practitioners) who all work together to provide you with the care you need, when you need it.  We recommend signing up for the patient portal called "MyChart".  Sign up information is provided on this After Visit Summary.  MyChart is used to connect with patients for Virtual Visits (Telemedicine).  Patients are able to view lab/test results, encounter notes, upcoming appointments, etc.  Non-urgent messages can be sent to your provider as well.   To learn more about what you can do with MyChart, go to [email or web address].    Your next appointment:   1 month(s)  02/09/20 ARRIVE AT 9:00   The format for your next appointment:   In Person  Provider:   You may see Sinclair Grooms, MD or one of the following Advanced Practice Providers on your designated Care Team:    Truitt Merle, NP  Cecilie Kicks, NP  Kathyrn Drown, NP    Other Instructions  Keep a log of your blood pressure and heart rate.  Call the office with these readings in 10  days.  [phone]

## 2020-01-16 ENCOUNTER — Telehealth: Payer: Self-pay | Admitting: Physician Assistant

## 2020-01-16 NOTE — Telephone Encounter (Signed)
Obtained BP readings from Martin Richardson's box.  Will record and send to Robbie Lis, PA-C for review.  Pt is going out of town from 12/23-1/3 to [place], [place] per daughter's note that was in with the BP readings.  Daughter mentions pt needs labs while he is away.  I do not see any orders for labs.   01/04/20 1030- 96/52, 54 1043- 123/61, 58 1520- 127/65, 60 2235- 152/73, 50  01/05/20 0600- 161/133, 54 1308- 133/66, 60 (felt woozy) 1510- 159/82, 58  01/06/20 0650- 136/73, 68 1145- 104/49, 60 (10 mins after exercise) 1450- 138/65, 60 1750- 132/65, 60 2030- 166/88, 65  01/07/20 0745- 138/90, 59 1045- 132/64, 60 (dizzy) 1510- 142/66, 60  01/08/20 0710- 135/65, 66 1720- 170/75, 72  01/09/20 0900- 123/62, 65 1900- 140/65, 64  01/10/20 0800- 138-67, 59 1630- 138/92, 117 1732- 116/62, 111 (heart beating fast)  01/11/20 0640- 118/62, 72 1510- 142/74, 65  01/12/20 0900- 126/70, 67 1930- 151/91, 62  01/13/20 0758- 149/75, 53 1010- 113/56, 57 (woozy) 1040- 105/53, 62

## 2020-01-16 NOTE — Telephone Encounter (Signed)
Patients daughter states she dropped off BP readings on Friday 01/13/2020 for Dr. Curly Shores to look at  but she hasn't received a call back from the nurse or Doctor. Daughter also states that the patient is going out of town from 01/19/20-01/30/20 and needs to do labs in between this time while out of town. Please advise.

## 2020-01-17 NOTE — Telephone Encounter (Signed)
Bhagat, Bhavinkumar, PA  Overall Blood pressure is stable. Few episode of elevated BP but does not seems persistently elevated. No change in medications.   I spoke with patient's daughter and gave her information from B. Bhagat,PA

## 2020-01-17 NOTE — Telephone Encounter (Signed)
I spoke with patient's daughter.  She reports the following readings-  01/13/20 9 PM--147/69,65  01/14/20 6:55 AM- 207/21,82. Blood sugar 105 10:05 PM-152/73,73  01/15/20 5:00 AM- 136/64,65 11:30 AM- 147/81,53. Blood sugar 125. Felt dizzy 5:00 PM- 155/74, 63  `01/16/20 5:30 AM -142/69, 61 1:00 PM- 119/56, 62  Daughter reports patient takes amlodipine at 8 AM with breakfast.   Daughter thinks lab work was ordered by [employer] and will follow up with their office. Patient was recently seen by dermatology and diagnosed with fungal infection on skin.  He was started on oral anti fungal medication and also a shampoo.  Itching has improved since medication started.

## 2020-01-24 ENCOUNTER — Ambulatory Visit: Payer: Medicare Other | Admitting: Podiatry

## 2020-01-30 DIAGNOSIS — N184 Chronic kidney disease, stage 4 (severe): Secondary | ICD-10-CM | POA: Diagnosis not present

## 2020-01-31 ENCOUNTER — Other Ambulatory Visit: Payer: Self-pay

## 2020-01-31 ENCOUNTER — Encounter: Payer: Self-pay | Admitting: Podiatry

## 2020-01-31 ENCOUNTER — Ambulatory Visit: Payer: Medicare Other | Admitting: Podiatry

## 2020-01-31 DIAGNOSIS — B351 Tinea unguium: Secondary | ICD-10-CM

## 2020-01-31 DIAGNOSIS — M79676 Pain in unspecified toe(s): Secondary | ICD-10-CM | POA: Diagnosis not present

## 2020-01-31 NOTE — Progress Notes (Signed)
He presents today chief complaint of painfully elongated toenails bilaterally.  Moderate edema bilateral lower extremity.  Right seems to be worse than the left he says  Objective: Pulses are strongly palpable.  Pitting edema bilaterally into the leg.  Does not extend distally into the foot.  Toenails are long thick yellow dystrophic-like mycotic no open lesions or wounds.  Assessment: Pain in limb secondary to onychomycosis and edema bilateral.  Plan: Recommend he follow-up with either his cardiologist, nephrologist or primary care about the edema.  I debrided his nails 1 through 5 bilaterally today.

## 2020-02-02 DIAGNOSIS — D631 Anemia in chronic kidney disease: Secondary | ICD-10-CM | POA: Diagnosis not present

## 2020-02-02 DIAGNOSIS — I129 Hypertensive chronic kidney disease with stage 1 through stage 4 chronic kidney disease, or unspecified chronic kidney disease: Secondary | ICD-10-CM | POA: Diagnosis not present

## 2020-02-02 DIAGNOSIS — C642 Malignant neoplasm of left kidney, except renal pelvis: Secondary | ICD-10-CM | POA: Diagnosis not present

## 2020-02-02 DIAGNOSIS — N2581 Secondary hyperparathyroidism of renal origin: Secondary | ICD-10-CM | POA: Diagnosis not present

## 2020-02-02 DIAGNOSIS — E1122 Type 2 diabetes mellitus with diabetic chronic kidney disease: Secondary | ICD-10-CM | POA: Diagnosis not present

## 2020-02-02 DIAGNOSIS — R338 Other retention of urine: Secondary | ICD-10-CM | POA: Diagnosis not present

## 2020-02-02 DIAGNOSIS — N184 Chronic kidney disease, stage 4 (severe): Secondary | ICD-10-CM | POA: Diagnosis not present

## 2020-02-08 DIAGNOSIS — Z794 Long term (current) use of insulin: Secondary | ICD-10-CM | POA: Diagnosis not present

## 2020-02-08 DIAGNOSIS — E119 Type 2 diabetes mellitus without complications: Secondary | ICD-10-CM | POA: Diagnosis not present

## 2020-02-08 DIAGNOSIS — H2589 Other age-related cataract: Secondary | ICD-10-CM | POA: Diagnosis not present

## 2020-02-08 DIAGNOSIS — Z961 Presence of intraocular lens: Secondary | ICD-10-CM | POA: Diagnosis not present

## 2020-02-08 DIAGNOSIS — H401130 Primary open-angle glaucoma, bilateral, stage unspecified: Secondary | ICD-10-CM | POA: Diagnosis not present

## 2020-02-08 DIAGNOSIS — H04123 Dry eye syndrome of bilateral lacrimal glands: Secondary | ICD-10-CM | POA: Diagnosis not present

## 2020-02-09 ENCOUNTER — Other Ambulatory Visit: Payer: Self-pay

## 2020-02-09 ENCOUNTER — Ambulatory Visit: Payer: Medicare Other | Admitting: Physician Assistant

## 2020-02-09 ENCOUNTER — Encounter: Payer: Self-pay | Admitting: Physician Assistant

## 2020-02-09 VITALS — BP 120/60 | HR 55 | Ht 71.0 in | Wt 226.0 lb

## 2020-02-09 DIAGNOSIS — Z4789 Encounter for other orthopedic aftercare: Secondary | ICD-10-CM | POA: Diagnosis not present

## 2020-02-09 DIAGNOSIS — R001 Bradycardia, unspecified: Secondary | ICD-10-CM

## 2020-02-09 DIAGNOSIS — N184 Chronic kidney disease, stage 4 (severe): Secondary | ICD-10-CM

## 2020-02-09 DIAGNOSIS — I10 Essential (primary) hypertension: Secondary | ICD-10-CM

## 2020-02-09 DIAGNOSIS — R42 Dizziness and giddiness: Secondary | ICD-10-CM | POA: Diagnosis not present

## 2020-02-09 DIAGNOSIS — S82899A Other fracture of unspecified lower leg, initial encounter for closed fracture: Secondary | ICD-10-CM | POA: Diagnosis not present

## 2020-02-09 DIAGNOSIS — I2510 Atherosclerotic heart disease of native coronary artery without angina pectoris: Secondary | ICD-10-CM

## 2020-02-09 DIAGNOSIS — Z9181 History of falling: Secondary | ICD-10-CM | POA: Diagnosis not present

## 2020-02-09 NOTE — Progress Notes (Signed)
Cardiology Office Note:    Date:  02/09/2020   ID:  Martin Richardson, DOB 03/11/1925, MRN [id number]  PCP:  Kristen Loader, FNP  [facility] Cardiologist:  Sinclair Grooms, MD  [place] Electrophysiologist:  None   Chief Complaint: 1 month follow up   History of Present Illness:    Martin Richardson is a 87 y.o. male with a hx of CVA, hypertension,hyperlipidemia,type 2 diabetes mellitus, hypothyroidism, CKD stage III-IV, and anemiapresented for close follow up.   Admitted 11/2019 for chest pain. No prior cardiac hx.  Symptoms are concerning and EKG with prior evidence of inferior infarction. Echo with LVEF of 60-65%, no wm abnormality. Cath showed 2 vessel obstructive CAD without critical stenosis. Recommended medical therapy. Had sinus bradycardia without significant pause.   Seen 01/04/2020 for hospital follow up. Reported dizziness and intermittent hypotension. Was not orthostatic but SBP did dropped from 130-->114. Patient felt wozzy. Discontinued Coreg.   Here today for follow up with daughter.  Review of home vital sign indicates blood pressure stable at 130 over 60s.  Few times blood pressure went over 140-50 but not significantly elevated.  Heart rate in 50s to 60s.  Patient only had 1 episode of dizziness since last office visit while on commode for bowel movement.  He is dealing with constipation.  Denies chest pain, shortness of breath, syncope, palpitation, orthopnea, PND, syncope or lower extremity edema.  He is wearing compression stocking.   Past Medical History:  Diagnosis Date  . Arthritis   . Blind left eye   . Cancer of left kidney ([place])   . Cataract   . Chronic kidney disease   . Constipation   . Diabetes mellitus without complication ([place])   . Glaucoma   . Hypertension   . Neuropathy   . Stroke [facility])     Past Surgical History:  Procedure Laterality Date  . CHOLECYSTECTOMY    . LEFT HEART CATH AND CORONARY ANGIOGRAPHY N/A 12/16/2019    Procedure: LEFT HEART CATH AND CORONARY ANGIOGRAPHY;  Surgeon: Martinique, Peter M, MD;  Location: [facility];  Service: Cardiovascular;  Laterality: N/A;  . ORIF ANKLE FRACTURE Right 08/13/2019   Procedure: OPEN REDUCTION INTERNAL FIXATION (ORIF) ANKLE FRACTURE;  Surgeon: Renette Butters, MD;  Location: [place];  Service: Orthopedics;  Laterality: Right;  . THYROID SURGERY    . TONSILLECTOMY      Current Medications: Current Meds  Medication Sig  . acetaminophen (TYLENOL) 325 MG tablet Take 650 mg by mouth every 4 (four) hours as needed for mild pain or headache.  Marland Kitchen amLODipine (NORVASC) 5 MG tablet Take 1 tablet (5 mg total) by mouth daily.  Marland Kitchen aspirin 81 MG chewable tablet Chew 1 tablet (81 mg total) by mouth daily.  Marland Kitchen atorvastatin (LIPITOR) 80 MG tablet Take 80 mg by mouth daily.  . betamethasone valerate (VALISONE) 0.1 % cream Apply 1 application topically every Monday, Wednesday, and Friday. For psoriasis -  HOLD  . carvedilol (COREG) 3.125 MG tablet 1 tablet with food  . cholecalciferol (VITAMIN D3) 25 MCG (1000 UNIT) tablet 1 tablet  . docusate sodium (COLACE) 100 MG capsule Take 100 mg by mouth as needed.  . famotidine (PEPCID) 20 MG tablet Take 20 mg by mouth at bedtime.  . gabapentin (NEURONTIN) 100 MG capsule Take 2 capsules (200 mg total) by mouth at bedtime. (Patient taking differently: Take 100 mg by mouth at bedtime.)  . glimepiride (AMARYL) 4 MG tablet Take  4 mg by mouth in the morning and at bedtime.   . insulin glargine (LANTUS) 100 UNIT/ML injection Inject 0.2 mLs (20 Units total) into the skin 2 (two) times daily. (Patient taking differently: Inject 40 Units into the skin daily.)  . Insulin Pen Needle 29G X 12MM MISC Use as directed  . isosorbide mononitrate (IMDUR) 30 MG 24 hr tablet Take 1 tablet (30 mg total) by mouth daily.  Marland Kitchen levothyroxine (SYNTHROID) 125 MCG tablet Take 125 mcg by mouth daily before breakfast.  . losartan (COZAAR) 25 MG tablet Take 25 mg by mouth  daily.  Glory Rosebush ULTRA test strip 1 each 2 (two) times daily.  . polyethylene glycol (MIRALAX / GLYCOLAX) 17 g packet Take 17 g by mouth 2 (two) times daily.  Marland Kitchen senna (SENOKOT) 8.6 MG TABS tablet Take 1 tablet (8.6 mg total) by mouth daily. (Patient taking differently: Take 1 tablet by mouth at bedtime as needed for mild constipation.)  . tamsulosin (FLOMAX) 0.4 MG CAPS capsule Take 0.4 mg by mouth daily.  Marland Kitchen terbinafine (LAMISIL) 250 MG tablet Take by mouth every Monday, Wednesday, and Friday.  . timolol (BETIMOL) 0.5 % ophthalmic solution Place 1 drop into both eyes 2 (two) times daily.  Marland Kitchen UNABLE TO FIND 1 tablet  . [DISCONTINUED] sorbitol 70 % solution See admin instructions.     Allergies:   Other and Adhesive [tape]   Social History   Socioeconomic History  . Marital status: Widowed    Spouse name: Not on file  . Number of children: Not on file  . Years of education: Not on file  . Highest education level: Not on file  Occupational History  . Not on file  Tobacco Use  . Smoking status: Former Research scientist (life sciences)  . Smokeless tobacco: Former Network engineer and Sexual Activity  . Alcohol use: Never  . Drug use: Never  . Sexual activity: Not on file  Other Topics Concern  . Not on file  Social History Narrative  . Not on file   Social Determinants of Health   Financial Resource Strain: Not on file  Food Insecurity: Not on file  Transportation Needs: Not on file  Physical Activity: Not on file  Stress: Not on file  Social Connections: Not on file     Family History: The patient's family history includes Diabetes Mellitus II in his mother.    ROS:   Please see the history of present illness.    All other systems reviewed and are negative.   EKGs/Labs/Other Studies Reviewed:    The following studies were reviewed today:  Echo 12/16/2019 LEFT HEART CATH AND CORONARY ANGIOGRAPHY    Conclusion    Mid LM to Dist LM lesion is 20% stenosed.  Mid LAD lesion is 50%  stenosed.  Dist LAD lesion is 80% stenosed.  2nd Diag lesion is 85% stenosed.  Mid Cx lesion is 70% stenosed.  LV end diastolic pressure is normal.   1. 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 Normal LVEDP  Plan: recommend medical management.   Echo 11/2019 1. Left ventricular ejection fraction, by estimation, is 60 to 65%. The  left ventricle has normal function. The left ventricle has no regional  wall motion abnormalities. There is severe asymmetric left ventricular  hypertrophy of the basal-septal  segment. Left ventricular diastolic parameters are indeterminate.  2. Right ventricular systolic function is mildly reduced. The right  ventricular size is normal.  3. Left atrial size was moderately dilated.  4. The mitral valve is normal in structure. No evidence of mitral valve  regurgitation. No evidence of mitral stenosis.  5. The aortic valve was not well visualized. Aortic valve regurgitation  is trivial. Mild aortic valve sclerosis is present, with no evidence of  aortic valve stenosis.  6. Aortic dilatation noted. There is mild dilatation of the aortic root,  measuring 42 mm. There is dilatation of the ascending aorta, measuring 43  mm.    EKG:  EKG is not  ordered today.   Recent Labs: 12/15/2019: ALT 14; B Natriuretic Peptide 83.1; TSH 1.450 12/16/2019: Hemoglobin 12.5; Magnesium 1.9; Platelets 185 12/17/2019: BUN 30; Creatinine, Ser 2.00; Potassium 4.3; Sodium 136  Recent Lipid Panel    Component Value Date/Time   CHOL 93 12/16/2019 0256   TRIG 199 (H) 12/16/2019 0256   HDL 25 (L) 12/16/2019 0256   CHOLHDL 3.7 12/16/2019 0256   VLDL 40 12/16/2019 0256   LDLCALC 28 12/16/2019 0256    Physical Exam:    VS:  BP 120/60   Pulse (!) 55   Ht 5\' 11"  (1.803 m)   Wt 226 lb (102.5 kg)   SpO2 95%   BMI 31.52 kg/m     Wt Readings from Last 3 Encounters:  02/09/20 226 lb (102.5 kg)  01/04/20 226 lb (102.5 kg)  12/17/19 217 lb 8 oz (98.7  kg)     GEN:  Well nourished, well developed in no acute distress HEENT: Normal NECK: No JVD; No carotid bruits LYMPHATICS: No lymphadenopathy CARDIAC: RRR, no murmurs, rubs, gallops RESPIRATORY:  Clear to auscultation without rales, wheezing or rhonchi  ABDOMEN: Soft, non-tender, non-distended MUSCULOSKELETAL:  No edema; No deformity  SKIN: Warm and dry NEUROLOGIC:  Alert and oriented x 3 PSYCHIATRIC:  Normal affect   ASSESSMENT AND PLAN:    1. CAD with moderate disease -No angina. Continue aspirin, Imdur and statin.  2.  Dizziness -Almost resolved since discontinuation of Coreg. -Single episode since last office visit while on commode.  Felt likely vagal mediated. -Heart rate in 50s to 60s.  Discussed monitor versus Apple Watch if worsening symptoms..  Patient will continue to monitor his symptoms and may get apple watch.  He will let us know if interested in monitor.  3. HLD -Continue statin  4. DM -Per PCP  5. HTN -Relatively stable.  Continue amlodipine and losartan.  6.  CKD III-IV -sees nephrology    Medication Adjustments/Labs and Tests Ordered: Current medicines are reviewed at length with the patient today.  Concerns regarding medicines are outlined above.  No orders of the defined types were placed in this encounter.  No orders of the defined types were placed in this encounter.   There are no Patient Instructions on file for this visit.   Jarrett Soho, [place]  02/09/2020 9:36 AM    [facility]

## 2020-02-09 NOTE — Patient Instructions (Addendum)
Medication Instructions:  Your physician recommends that you continue on your current medications as directed. Please refer to the Current Medication list given to you today.  *If you need a refill on your cardiac medications before your next appointment, please call your pharmacy*   Lab Work: None ordered  If you have labs (blood work) drawn today and your tests are completely normal, you will receive your results only by: Marland Kitchen MyChart Message (if you have MyChart) OR . A paper copy in the mail If you have any lab test that is abnormal or we need to change your treatment, we will call you to review the results.   Testing/Procedures: None ordered   Follow-Up: At [facility], you and your health needs are our priority.  As part of our continuing mission to provide you with exceptional heart care, we have created designated Provider Care Teams.  These Care Teams include your primary Cardiologist (physician) and Advanced Practice Providers (APPs -  Physician Assistants and Nurse Practitioners) who all work together to provide you with the care you need, when you need it.  We recommend signing up for the patient portal called "MyChart".  Sign up information is provided on this After Visit Summary.  MyChart is used to connect with patients for Virtual Visits (Telemedicine).  Patients are able to view lab/test results, encounter notes, upcoming appointments, etc.  Non-urgent messages can be sent to your provider as well.   To learn more about what you can do with MyChart, go to [email or web address].    Your next appointment:   3-4 month(s)  The format for your next appointment:   In Person  Provider:   You may see Sinclair Grooms, MD or one of the following Advanced Practice Providers on your designated Care Team:    Truitt Merle, NP  Cecilie Kicks, NP  Kathyrn Drown, NP    Other Instructions

## 2020-02-11 DIAGNOSIS — N390 Urinary tract infection, site not specified: Secondary | ICD-10-CM | POA: Diagnosis not present

## 2020-02-28 ENCOUNTER — Telehealth: Payer: Self-pay | Admitting: *Deleted

## 2020-02-28 DIAGNOSIS — B356 Tinea cruris: Secondary | ICD-10-CM | POA: Diagnosis not present

## 2020-02-28 DIAGNOSIS — L309 Dermatitis, unspecified: Secondary | ICD-10-CM | POA: Diagnosis not present

## 2020-02-28 MED ORDER — ISOSORBIDE MONONITRATE ER 30 MG PO TB24: 30.0000 mg | ORAL_TABLET | Freq: Every day | ORAL | 3 refills | Status: DC

## 2020-02-28 MED ORDER — AMLODIPINE BESYLATE 5 MG PO TABS: 5.0000 mg | ORAL_TABLET | Freq: Every day | ORAL | 3 refills | Status: DC

## 2020-02-28 NOTE — Telephone Encounter (Signed)
Patient Dtr calling into assist father with medication refill. Patient and Dtr will contact back with number to pharmacy

## 2020-03-02 ENCOUNTER — Telehealth: Payer: Self-pay | Admitting: Interventional Cardiology

## 2020-03-02 NOTE — Telephone Encounter (Signed)
Called pt's daughter [name] and left message informing her that I called [facility] mail order pharmacy and they stated that pt needed insurance information and that they do have pt's medications isosorbide and amlodipine. I also left the customer service number that was given by [facility], [phone].

## 2020-03-02 NOTE — Telephone Encounter (Signed)
*  STAT* If patient is at the pharmacy, call can be transferred to refill team.   1. Which medications need to be refilled? (please list name of each medication and dose if known)  amLODipine (NORVASC) 5 MG tablet isosorbide mononitrate (IMDUR) 30 MG 24 hr tablet  2. Which pharmacy/location (including street and city if local pharmacy) is medication to be sent to? [place], [address], [address]  3. Do they need a 30 day or 90 day supply? 90 with refills

## 2020-03-05 ENCOUNTER — Telehealth: Payer: Self-pay | Admitting: Interventional Cardiology

## 2020-03-05 NOTE — Telephone Encounter (Signed)
Called [facility] mail order pharmacy and they stated that they do have these medications, they needed information on pt's insurance coverage. I called pt's daughter to give her the information that [facility] gave me, Ph#-[phone], Acct# [id number], so they can look up the account. Pt's daughter thanked me.

## 2020-03-05 NOTE — Telephone Encounter (Signed)
°*  STAT* If patient is at the pharmacy, call can be transferred to refill team.   1. Which medications need to be refilled? (please list name of each medication and dose if known) amLODipine (NORVASC) 5 MG tablet isosorbide mononitrate (IMDUR) 30 MG 24 hr tablet 2. Which pharmacy/location (including street and city if local pharmacy) is medication to be sent to? [place], [address] [place], [address]  3. Do they need a 30 day or 90 day supply? 90 day supply  Prescriptions sent on 02/28/20 were not received.

## 2020-03-06 ENCOUNTER — Other Ambulatory Visit: Payer: Self-pay

## 2020-03-06 ENCOUNTER — Telehealth: Payer: Self-pay

## 2020-03-06 MED ORDER — AMLODIPINE BESYLATE 5 MG PO TABS: 5.0000 mg | ORAL_TABLET | Freq: Every day | ORAL | 3 refills | Status: DC

## 2020-03-06 MED ORDER — ISOSORBIDE MONONITRATE ER 30 MG PO TB24: 30.0000 mg | ORAL_TABLET | Freq: Every day | ORAL | 3 refills | Status: DC

## 2020-03-06 NOTE — Telephone Encounter (Signed)
**Note De-Identified  Obfuscation** We received a Isosorbide PA form from [facility] with a message written on top of the form that an Amlodipine PA is required as well. I called [facility] and attempted to do both of these prior authorizations but was advised by Elmo that both Isosorbide and Amlodipine are covered by the pts plan without a PA required. Elmo states that when the pharmacy ([facility] mail pharmacy) runs the pts Isosorbide and Amlodipine RXs they will see as he did that a PA is not required for either medications.

## 2020-03-11 DIAGNOSIS — Z4789 Encounter for other orthopedic aftercare: Secondary | ICD-10-CM | POA: Diagnosis not present

## 2020-03-11 DIAGNOSIS — S82899A Other fracture of unspecified lower leg, initial encounter for closed fracture: Secondary | ICD-10-CM | POA: Diagnosis not present

## 2020-03-11 DIAGNOSIS — Z9181 History of falling: Secondary | ICD-10-CM | POA: Diagnosis not present

## 2020-03-26 DIAGNOSIS — E1169 Type 2 diabetes mellitus with other specified complication: Secondary | ICD-10-CM | POA: Diagnosis not present

## 2020-03-26 DIAGNOSIS — I2510 Atherosclerotic heart disease of native coronary artery without angina pectoris: Secondary | ICD-10-CM | POA: Diagnosis not present

## 2020-03-26 DIAGNOSIS — R42 Dizziness and giddiness: Secondary | ICD-10-CM | POA: Diagnosis not present

## 2020-03-26 DIAGNOSIS — Z794 Long term (current) use of insulin: Secondary | ICD-10-CM | POA: Diagnosis not present

## 2020-03-26 DIAGNOSIS — N189 Chronic kidney disease, unspecified: Secondary | ICD-10-CM | POA: Diagnosis not present

## 2020-03-30 ENCOUNTER — Telehealth: Payer: Self-pay | Admitting: Physician Assistant

## 2020-03-30 NOTE — Telephone Encounter (Signed)
Thanks

## 2020-03-30 NOTE — Telephone Encounter (Signed)
Spoke with the patient's daughter.  Adv to stop IMDUR, use compression stockings and stay hydrated.  They would be interested in him wearing a monitor if it is recommended, but he has very sensitive skin so not sure how long he could tolerate it.  She is going to call back Monday with how he is doing.  Go to ER with any worsening symptoms.

## 2020-03-30 NOTE — Telephone Encounter (Signed)
His dizziness was improved after discontinuation of coreg when I saw him in Jan. We did discussed apple watch or monitor to r/o significant bradycardia. Some component of orthostasis.   Wear compression stocking. Consider monitor to r/o bradycardia. ? If patient is willing to consider pacemaker if needed.   Dr. [name] please advise.

## 2020-03-30 NOTE — Telephone Encounter (Signed)
Stop Imdur

## 2020-03-30 NOTE — Telephone Encounter (Signed)
Spoke with Sharee Pimple from Dr Irma Newness office re pt Pt has been complaining of up and down B/P and HR  Per pt's daughter pt blacked out yesterday after episode pt's B/P was 166/136 HR 54 and few minutes later 142/70 HR 51 Pt has appt with Dr [name] but not til May Will forward to Dr [name] and Robbie Lis PA  for review and recommendations./cy

## 2020-03-30 NOTE — Telephone Encounter (Signed)
Pt c/o BP issue: STAT if pt c/o blurred vision, one-sided weakness or slurred speech  1. What are your last 5 BP readings? Yesterday 03/29/2020: 6:30am 123/64 HR 60 7:00am 115/63  HR 115 6:00pm 154/76 HR 57 8:15pm 166/136 HR 54 8:20pm 142/70 HR 51  2. Are you having any other symptoms (ex. Dizziness, headache, blurred vision, passed out)? Dizziness and knee's buckled last night and he about passed out.   3. What is your BP issue? Patient's daughter states that her dad's BP is elevated and HR is fluctuating. She reports that he is still dizzy and she is scared he is going to have a stroke. She states that Dr. Ammie Ferrier office had recommended he go to the ED since they had not heard anything from cardiology yet but she does not want to take him to the ED. She wants to know if it would be safer for him to come to the office. Please advise.

## 2020-03-30 NOTE — Telephone Encounter (Signed)
Sharee Pimple from Dr Kathyrn Lass office called. She said she needs to talk to [facility] or a nurse. She says she says she needs to tell them all that is going on with the pt.

## 2020-03-30 NOTE — Telephone Encounter (Signed)
New Message:     Martin Richardson from Dr Kathyrn Lass office called. She said she needs to talk to [facility] or a nurse. She says she says she needs to tell them all that is going on with the pt.

## 2020-04-02 ENCOUNTER — Telehealth: Payer: Self-pay | Admitting: Interventional Cardiology

## 2020-04-02 NOTE — Telephone Encounter (Signed)
Spoke with daughter and she states pt is doing much better.  Was able to get out Saturday and Sunday for lunch with his other daughter.  Advised BPs look better.  Told daughter to have him continue current medications and let us know if BP starts to creep up he has CP.  Daughter verbalized understanding and was appreciative for call.

## 2020-04-02 NOTE — Telephone Encounter (Signed)
Stopped isosorbide mononitrate (IMDUR) 30 MG 24 hr tablet on Friday. Here are the current BP, HR, & BS from Friday to Monday:  126/68; 59 119/61; 56 - 66 bs 133/62; 61 - 137 bs 113/64; 61 - 115 bs  Daughter says that since medication has been stopped, patient has been much better. Would like for Dr. [name] or Robbie Lis to call back to confirm readings and talk about medication.

## 2020-04-03 DIAGNOSIS — Z794 Long term (current) use of insulin: Secondary | ICD-10-CM | POA: Diagnosis not present

## 2020-04-03 DIAGNOSIS — N1832 Chronic kidney disease, stage 3b: Secondary | ICD-10-CM | POA: Diagnosis not present

## 2020-04-03 DIAGNOSIS — I2510 Atherosclerotic heart disease of native coronary artery without angina pectoris: Secondary | ICD-10-CM | POA: Diagnosis not present

## 2020-04-03 DIAGNOSIS — G629 Polyneuropathy, unspecified: Secondary | ICD-10-CM | POA: Diagnosis not present

## 2020-04-03 DIAGNOSIS — G4700 Insomnia, unspecified: Secondary | ICD-10-CM | POA: Diagnosis not present

## 2020-04-03 DIAGNOSIS — E039 Hypothyroidism, unspecified: Secondary | ICD-10-CM | POA: Diagnosis not present

## 2020-04-03 DIAGNOSIS — R42 Dizziness and giddiness: Secondary | ICD-10-CM | POA: Diagnosis not present

## 2020-04-03 DIAGNOSIS — E1169 Type 2 diabetes mellitus with other specified complication: Secondary | ICD-10-CM | POA: Diagnosis not present

## 2020-04-03 DIAGNOSIS — E785 Hyperlipidemia, unspecified: Secondary | ICD-10-CM | POA: Diagnosis not present

## 2020-04-03 DIAGNOSIS — I10 Essential (primary) hypertension: Secondary | ICD-10-CM | POA: Diagnosis not present

## 2020-04-08 DIAGNOSIS — Z4789 Encounter for other orthopedic aftercare: Secondary | ICD-10-CM | POA: Diagnosis not present

## 2020-04-08 DIAGNOSIS — Z9181 History of falling: Secondary | ICD-10-CM | POA: Diagnosis not present

## 2020-04-08 DIAGNOSIS — S82899A Other fracture of unspecified lower leg, initial encounter for closed fracture: Secondary | ICD-10-CM | POA: Diagnosis not present

## 2020-04-13 DIAGNOSIS — H81393 Other peripheral vertigo, bilateral: Secondary | ICD-10-CM | POA: Diagnosis not present

## 2020-04-13 DIAGNOSIS — H8190 Unspecified disorder of vestibular function, unspecified ear: Secondary | ICD-10-CM | POA: Diagnosis not present

## 2020-04-13 DIAGNOSIS — R42 Dizziness and giddiness: Secondary | ICD-10-CM | POA: Diagnosis not present

## 2020-04-13 DIAGNOSIS — H8111 Benign paroxysmal vertigo, right ear: Secondary | ICD-10-CM | POA: Diagnosis not present

## 2020-04-13 DIAGNOSIS — L309 Dermatitis, unspecified: Secondary | ICD-10-CM | POA: Diagnosis not present

## 2020-04-13 DIAGNOSIS — L820 Inflamed seborrheic keratosis: Secondary | ICD-10-CM | POA: Diagnosis not present

## 2020-04-16 DIAGNOSIS — H8111 Benign paroxysmal vertigo, right ear: Secondary | ICD-10-CM | POA: Diagnosis not present

## 2020-04-16 DIAGNOSIS — H8190 Unspecified disorder of vestibular function, unspecified ear: Secondary | ICD-10-CM | POA: Diagnosis not present

## 2020-04-16 DIAGNOSIS — R42 Dizziness and giddiness: Secondary | ICD-10-CM | POA: Diagnosis not present

## 2020-04-16 DIAGNOSIS — H81393 Other peripheral vertigo, bilateral: Secondary | ICD-10-CM | POA: Diagnosis not present

## 2020-04-18 DIAGNOSIS — H81393 Other peripheral vertigo, bilateral: Secondary | ICD-10-CM | POA: Diagnosis not present

## 2020-04-18 DIAGNOSIS — H8111 Benign paroxysmal vertigo, right ear: Secondary | ICD-10-CM | POA: Diagnosis not present

## 2020-04-18 DIAGNOSIS — H8190 Unspecified disorder of vestibular function, unspecified ear: Secondary | ICD-10-CM | POA: Diagnosis not present

## 2020-04-18 DIAGNOSIS — R42 Dizziness and giddiness: Secondary | ICD-10-CM | POA: Diagnosis not present

## 2020-04-20 DIAGNOSIS — K219 Gastro-esophageal reflux disease without esophagitis: Secondary | ICD-10-CM | POA: Diagnosis not present

## 2020-04-20 DIAGNOSIS — G4700 Insomnia, unspecified: Secondary | ICD-10-CM | POA: Diagnosis not present

## 2020-04-20 DIAGNOSIS — N1832 Chronic kidney disease, stage 3b: Secondary | ICD-10-CM | POA: Diagnosis not present

## 2020-04-20 DIAGNOSIS — E1169 Type 2 diabetes mellitus with other specified complication: Secondary | ICD-10-CM | POA: Diagnosis not present

## 2020-04-20 DIAGNOSIS — I10 Essential (primary) hypertension: Secondary | ICD-10-CM | POA: Diagnosis not present

## 2020-04-20 DIAGNOSIS — E039 Hypothyroidism, unspecified: Secondary | ICD-10-CM | POA: Diagnosis not present

## 2020-04-20 DIAGNOSIS — E119 Type 2 diabetes mellitus without complications: Secondary | ICD-10-CM | POA: Diagnosis not present

## 2020-04-20 DIAGNOSIS — I2510 Atherosclerotic heart disease of native coronary artery without angina pectoris: Secondary | ICD-10-CM | POA: Diagnosis not present

## 2020-04-20 DIAGNOSIS — E785 Hyperlipidemia, unspecified: Secondary | ICD-10-CM | POA: Diagnosis not present

## 2020-04-23 DIAGNOSIS — H8190 Unspecified disorder of vestibular function, unspecified ear: Secondary | ICD-10-CM | POA: Diagnosis not present

## 2020-04-23 DIAGNOSIS — R42 Dizziness and giddiness: Secondary | ICD-10-CM | POA: Diagnosis not present

## 2020-04-23 DIAGNOSIS — H8111 Benign paroxysmal vertigo, right ear: Secondary | ICD-10-CM | POA: Diagnosis not present

## 2020-04-23 DIAGNOSIS — H81393 Other peripheral vertigo, bilateral: Secondary | ICD-10-CM | POA: Diagnosis not present

## 2020-04-26 DIAGNOSIS — H81393 Other peripheral vertigo, bilateral: Secondary | ICD-10-CM | POA: Diagnosis not present

## 2020-04-26 DIAGNOSIS — H8190 Unspecified disorder of vestibular function, unspecified ear: Secondary | ICD-10-CM | POA: Diagnosis not present

## 2020-04-26 DIAGNOSIS — R42 Dizziness and giddiness: Secondary | ICD-10-CM | POA: Diagnosis not present

## 2020-04-26 DIAGNOSIS — H8111 Benign paroxysmal vertigo, right ear: Secondary | ICD-10-CM | POA: Diagnosis not present

## 2020-04-30 DIAGNOSIS — H8190 Unspecified disorder of vestibular function, unspecified ear: Secondary | ICD-10-CM | POA: Diagnosis not present

## 2020-04-30 DIAGNOSIS — H8111 Benign paroxysmal vertigo, right ear: Secondary | ICD-10-CM | POA: Diagnosis not present

## 2020-04-30 DIAGNOSIS — R42 Dizziness and giddiness: Secondary | ICD-10-CM | POA: Diagnosis not present

## 2020-04-30 DIAGNOSIS — H81393 Other peripheral vertigo, bilateral: Secondary | ICD-10-CM | POA: Diagnosis not present

## 2020-05-01 ENCOUNTER — Other Ambulatory Visit: Payer: Self-pay

## 2020-05-01 ENCOUNTER — Ambulatory Visit: Payer: Medicare Other | Admitting: Podiatry

## 2020-05-01 ENCOUNTER — Encounter: Payer: Self-pay | Admitting: Podiatry

## 2020-05-01 DIAGNOSIS — B351 Tinea unguium: Secondary | ICD-10-CM | POA: Diagnosis not present

## 2020-05-01 DIAGNOSIS — M79676 Pain in unspecified toe(s): Secondary | ICD-10-CM

## 2020-05-01 NOTE — Progress Notes (Signed)
He presents today with his daughter chief complaint of painful elongated toenails bilaterally.  Objective: Toenails are long thick yellow dystrophic Lee mycotic painful palpation as well as debridement.  Assessment: Pain Signet onychomycosis.  Plan: Debridement of toenails 1 through 5 bilateral.

## 2020-05-02 DIAGNOSIS — H8111 Benign paroxysmal vertigo, right ear: Secondary | ICD-10-CM | POA: Diagnosis not present

## 2020-05-02 DIAGNOSIS — H81393 Other peripheral vertigo, bilateral: Secondary | ICD-10-CM | POA: Diagnosis not present

## 2020-05-02 DIAGNOSIS — R42 Dizziness and giddiness: Secondary | ICD-10-CM | POA: Diagnosis not present

## 2020-05-02 DIAGNOSIS — H8190 Unspecified disorder of vestibular function, unspecified ear: Secondary | ICD-10-CM | POA: Diagnosis not present

## 2020-05-07 DIAGNOSIS — R42 Dizziness and giddiness: Secondary | ICD-10-CM | POA: Diagnosis not present

## 2020-05-07 DIAGNOSIS — H8190 Unspecified disorder of vestibular function, unspecified ear: Secondary | ICD-10-CM | POA: Diagnosis not present

## 2020-05-07 DIAGNOSIS — H81393 Other peripheral vertigo, bilateral: Secondary | ICD-10-CM | POA: Diagnosis not present

## 2020-05-07 DIAGNOSIS — H8111 Benign paroxysmal vertigo, right ear: Secondary | ICD-10-CM | POA: Diagnosis not present

## 2020-05-09 DIAGNOSIS — H8111 Benign paroxysmal vertigo, right ear: Secondary | ICD-10-CM | POA: Diagnosis not present

## 2020-05-09 DIAGNOSIS — H8190 Unspecified disorder of vestibular function, unspecified ear: Secondary | ICD-10-CM | POA: Diagnosis not present

## 2020-05-09 DIAGNOSIS — S82899A Other fracture of unspecified lower leg, initial encounter for closed fracture: Secondary | ICD-10-CM | POA: Diagnosis not present

## 2020-05-09 DIAGNOSIS — Z4789 Encounter for other orthopedic aftercare: Secondary | ICD-10-CM | POA: Diagnosis not present

## 2020-05-09 DIAGNOSIS — R42 Dizziness and giddiness: Secondary | ICD-10-CM | POA: Diagnosis not present

## 2020-05-09 DIAGNOSIS — Z9181 History of falling: Secondary | ICD-10-CM | POA: Diagnosis not present

## 2020-05-09 DIAGNOSIS — H81393 Other peripheral vertigo, bilateral: Secondary | ICD-10-CM | POA: Diagnosis not present

## 2020-05-22 DIAGNOSIS — R519 Headache, unspecified: Secondary | ICD-10-CM | POA: Diagnosis not present

## 2020-05-22 DIAGNOSIS — S0083XA Contusion of other part of head, initial encounter: Secondary | ICD-10-CM | POA: Diagnosis not present

## 2020-05-22 DIAGNOSIS — S0990XA Unspecified injury of head, initial encounter: Secondary | ICD-10-CM | POA: Diagnosis not present

## 2020-05-22 DIAGNOSIS — S63602A Unspecified sprain of left thumb, initial encounter: Secondary | ICD-10-CM | POA: Diagnosis not present

## 2020-05-22 DIAGNOSIS — S199XXA Unspecified injury of neck, initial encounter: Secondary | ICD-10-CM | POA: Diagnosis not present

## 2020-05-22 DIAGNOSIS — S6992XA Unspecified injury of left wrist, hand and finger(s), initial encounter: Secondary | ICD-10-CM | POA: Diagnosis not present

## 2020-05-22 DIAGNOSIS — Z87891 Personal history of nicotine dependence: Secondary | ICD-10-CM | POA: Diagnosis not present

## 2020-05-30 DIAGNOSIS — H8190 Unspecified disorder of vestibular function, unspecified ear: Secondary | ICD-10-CM | POA: Diagnosis not present

## 2020-05-30 DIAGNOSIS — H8111 Benign paroxysmal vertigo, right ear: Secondary | ICD-10-CM | POA: Diagnosis not present

## 2020-05-30 DIAGNOSIS — R42 Dizziness and giddiness: Secondary | ICD-10-CM | POA: Diagnosis not present

## 2020-05-30 DIAGNOSIS — H81393 Other peripheral vertigo, bilateral: Secondary | ICD-10-CM | POA: Diagnosis not present

## 2020-06-05 DIAGNOSIS — R42 Dizziness and giddiness: Secondary | ICD-10-CM | POA: Diagnosis not present

## 2020-06-05 DIAGNOSIS — H81393 Other peripheral vertigo, bilateral: Secondary | ICD-10-CM | POA: Diagnosis not present

## 2020-06-05 DIAGNOSIS — H8111 Benign paroxysmal vertigo, right ear: Secondary | ICD-10-CM | POA: Diagnosis not present

## 2020-06-05 DIAGNOSIS — H8190 Unspecified disorder of vestibular function, unspecified ear: Secondary | ICD-10-CM | POA: Diagnosis not present

## 2020-06-07 DIAGNOSIS — R42 Dizziness and giddiness: Secondary | ICD-10-CM | POA: Diagnosis not present

## 2020-06-07 DIAGNOSIS — H8111 Benign paroxysmal vertigo, right ear: Secondary | ICD-10-CM | POA: Diagnosis not present

## 2020-06-07 DIAGNOSIS — H81393 Other peripheral vertigo, bilateral: Secondary | ICD-10-CM | POA: Diagnosis not present

## 2020-06-07 DIAGNOSIS — R195 Other fecal abnormalities: Secondary | ICD-10-CM | POA: Diagnosis not present

## 2020-06-07 DIAGNOSIS — H8190 Unspecified disorder of vestibular function, unspecified ear: Secondary | ICD-10-CM | POA: Diagnosis not present

## 2020-06-07 DIAGNOSIS — Z1211 Encounter for screening for malignant neoplasm of colon: Secondary | ICD-10-CM | POA: Diagnosis not present

## 2020-06-08 DIAGNOSIS — S82899A Other fracture of unspecified lower leg, initial encounter for closed fracture: Secondary | ICD-10-CM | POA: Diagnosis not present

## 2020-06-08 DIAGNOSIS — Z9181 History of falling: Secondary | ICD-10-CM | POA: Diagnosis not present

## 2020-06-08 DIAGNOSIS — Z4789 Encounter for other orthopedic aftercare: Secondary | ICD-10-CM | POA: Diagnosis not present

## 2020-06-10 NOTE — Progress Notes (Signed)
Cardiology Office Note:    Date:  06/11/2020   ID:  Martin Richardson, DOB 1925/06/04, MRN [id number]  PCP:  Kristen Loader, FNP  Cardiologist:  Sinclair Grooms, MD   Referring MD: Kristen Loader, FNP   Chief Complaint  Patient presents with  . Hypertension  . Hyperlipidemia  . Irregular Heart Beat    History of Present Illness:    Martin Richardson is a 87 y.o. male with a hx of CVA, hypertension,hyperlipidemia,type 2 diabetes mellitus, sinus bradycardia, hypothyroidism, CKD stage III-IV, and anemia.  Today is a routine follow-up.  He is accompanied by his daughter Martin Richardson, the wife of a patient [name]).  He has done relatively well and had no complaints until this morning.  According to the patient and his daughter he had an episode where he fell lightheaded, arms became heavy, felt as though his heart was racing, did develop a little diaphoresis and had lower blood pressure.  A heart rate recorded around the time was 115 bpm.  He felt that perhaps his blood sugar was low so he ate.  He then checked her blood sugar thereafter and it was 504.  The episode lasted less than 10 minutes.  He is not having any discomfort any issue at the current time.  He does note that he has documentation of heart rates and blood pressure over the last several months occasionally identify heart rates above 110 bpm.  These have never been associated with symptoms as they were today.  Past Medical History:  Diagnosis Date  . Arthritis   . Blind left eye   . Cancer of left kidney ([place])   . Cataract   . Chronic kidney disease   . Constipation   . Diabetes mellitus without complication ([place])   . Glaucoma   . Hypertension   . Neuropathy   . Stroke [facility])     Past Surgical History:  Procedure Laterality Date  . CHOLECYSTECTOMY    . LEFT HEART CATH AND CORONARY ANGIOGRAPHY N/A 12/16/2019   Procedure: LEFT HEART CATH AND CORONARY ANGIOGRAPHY;  Surgeon: Martinique, Peter M, MD;   Location: [facility];  Service: Cardiovascular;  Laterality: N/A;  . ORIF ANKLE FRACTURE Right 08/13/2019   Procedure: OPEN REDUCTION INTERNAL FIXATION (ORIF) ANKLE FRACTURE;  Surgeon: Renette Butters, MD;  Location: [place];  Service: Orthopedics;  Laterality: Right;  . THYROID SURGERY    . TONSILLECTOMY      Current Medications: Current Meds  Medication Sig  . acetaminophen (TYLENOL) 325 MG tablet Take 650 mg by mouth every 4 (four) hours as needed for mild pain or headache.  Marland Kitchen amLODipine (NORVASC) 5 MG tablet Take 1 tablet (5 mg total) by mouth daily.  Marland Kitchen aspirin 81 MG chewable tablet Chew 1 tablet (81 mg total) by mouth daily.  Marland Kitchen atorvastatin (LIPITOR) 10 MG tablet Take 1 tablet by mouth daily.  . betamethasone valerate (VALISONE) 0.1 % cream Apply 1 application topically every Monday, Wednesday, and Friday. For psoriasis -  HOLD  . Calcium Polycarbophil (FIBER) 625 MG TABS Take 2 tablets by mouth as needed.  . cholecalciferol (VITAMIN D3) 25 MCG (1000 UNIT) tablet 1 tablet  . docusate sodium (COLACE) 100 MG capsule Take 100 mg by mouth as needed.  . famotidine (PEPCID) 20 MG tablet Take 20 mg by mouth at bedtime.  . gabapentin (NEURONTIN) 100 MG capsule Take 2 capsules (200 mg total) by mouth at bedtime.  Marland Kitchen glimepiride (  AMARYL) 4 MG tablet Take 4 mg by mouth in the morning and at bedtime.   . insulin glargine (LANTUS) 100 UNIT/ML injection Inject 0.2 mLs (20 Units total) into the skin 2 (two) times daily.  . Insulin Pen Needle 29G X 12MM MISC Use as directed  . levothyroxine (SYNTHROID) 125 MCG tablet Take 125 mcg by mouth daily before breakfast.  . losartan (COZAAR) 25 MG tablet Take 25 mg by mouth daily.  Glory Rosebush ULTRA test strip 1 each 2 (two) times daily.  . polyethylene glycol (MIRALAX / GLYCOLAX) 17 g packet Take 17 g by mouth daily as needed.  . senna (SENOKOT) 8.6 MG TABS tablet Take 1 tablet (8.6 mg total) by mouth daily.  . tamsulosin (FLOMAX) 0.4 MG CAPS capsule  Take 0.4 mg by mouth daily.  Marland Kitchen terbinafine (LAMISIL) 250 MG tablet Take by mouth every Monday, Wednesday, and Friday.  . timolol (TIMOPTIC) 0.5 % ophthalmic solution SMARTSIG:In Eye(s)  . triamcinolone ointment (KENALOG) 0.1 % Apply topically.  Marland Kitchen UNABLE TO FIND 1 tablet     Allergies:   Other and Adhesive [tape]   Social History   Socioeconomic History  . Marital status: Widowed    Spouse name: Not on file  . Number of children: Not on file  . Years of education: Not on file  . Highest education level: Not on file  Occupational History  . Not on file  Tobacco Use  . Smoking status: Former Research scientist (life sciences)  . Smokeless tobacco: Former Network engineer and Sexual Activity  . Alcohol use: Never  . Drug use: Never  . Sexual activity: Not on file  Other Topics Concern  . Not on file  Social History Narrative  . Not on file   Social Determinants of Health   Financial Resource Strain: Not on file  Food Insecurity: Not on file  Transportation Needs: Not on file  Physical Activity: Not on file  Stress: Not on file  Social Connections: Not on file     Family History: The patient's family history includes Diabetes Mellitus II in his mother.  ROS:   Please see the history of present illness.    Has not been recently following his diabetic diet.  He went to [facility] parties.  He is eating anything he wants.  Lives independently.  Having some left upper quadrant discomfort this morning.  Has not had that before.  Takes fiber in his diet to keep bowel movements regular.  Was able to ambulate into the office today without difficulty.  All other systems reviewed and are negative.  EKGs/Labs/Other Studies Reviewed:    The following studies were reviewed today: Cardiac catheterization 12/16/2019: Diagnostic Dominance: Right      Mid LM to Dist LM lesion is 20% stenosed.  Mid LAD lesion is 50% stenosed.  Dist LAD lesion is 80% stenosed.  2nd Diag lesion is 85% stenosed.  Mid  Cx lesion is 70% stenosed.  LV end diastolic pressure is normal.   1. 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 Normal LVEDP  Plan: recommend medical management.     EKG:  EKG normal sinus rhythm long first-degree AV block greater than 370 ms, otherwise normal.  Recent Labs: 12/15/2019: ALT 14; B Natriuretic Peptide 83.1; TSH 1.450 12/16/2019: Hemoglobin 12.5; Magnesium 1.9; Platelets 185 12/17/2019: BUN 30; Creatinine, Ser 2.00; Potassium 4.3; Sodium 136  Recent Lipid Panel    Component Value Date/Time   CHOL 93 12/16/2019 0256   TRIG 199 (H)  12/16/2019 0256   HDL 25 (L) 12/16/2019 0256   CHOLHDL 3.7 12/16/2019 0256   VLDL 40 12/16/2019 0256   LDLCALC 28 12/16/2019 0256    Physical Exam:    VS:  BP 128/70   Pulse 82   Ht 5\' 11"  (1.803 m)   Wt 225 lb (102.1 kg)   SpO2 95%   BMI 31.38 kg/m     Wt Readings from Last 3 Encounters:  06/11/20 225 lb (102.1 kg)  02/09/20 226 lb (102.5 kg)  01/04/20 226 lb (102.5 kg)     GEN: Appears younger than the stated age.  Obese.. No acute distress HEENT: Normal NECK: No JVD. LYMPHATICS: No lymphadenopathy CARDIAC: No murmur. RRR no gallop, or edema. VASCULAR:  Normal Pulses. No bruits. RESPIRATORY:  Clear to auscultation without rales, wheezing or rhonchi  ABDOMEN: Soft, non-tender, non-distended, No pulsatile mass, MUSCULOSKELETAL: No deformity  SKIN: Warm and dry NEUROLOGIC:  Alert and oriented x 3 PSYCHIATRIC:  Normal affect   ASSESSMENT:    1. Coronary artery disease involving native coronary artery of native heart without angina pectoris   2. Bradycardia   3. Essential hypertension   4. CKD (chronic kidney disease), stage IV ([place])   5. Tachycardia   6. First degree AV block    PLAN:    In order of problems listed above:  1. Nonobstructive CAD by cath in 2021. 2. Palpitations today along with history of bradycardia raise question of possible tachybradycardia syndrome or atrial fibrillation.  A  4-week monitor will be done. 3. Excellent current blood pressure 128/70.  His blood pressures are stable at home. 4. Most recent creatinine was 2.25 in January.  This is stage IV CKD. 5. As noted above under bradycardia, a monitor will be done to exclude atrial fib or AV reentrant tachycardias. 6. The monitor will also help explore whether AV block could be at play.  Carvedilol was discontinued in January 2022 and there are no other medications that blocked AV conduction.  Plan watchful waiting.  Follow-up in 6 months unless monitor demonstrates a significant tacky or bradycardia arrhythmia.   Medication Adjustments/Labs and Tests Ordered: Current medicines are reviewed at length with the patient today.  Concerns regarding medicines are outlined above.  Orders Placed This Encounter  Procedures  . Cardiac event monitor  . EKG 12-Lead   No orders of the defined types were placed in this encounter.   Patient Instructions  Medication Instructions:  Your physician recommends that you continue on your current medications as directed. Please refer to the Current Medication list given to you today.  *If you need a refill on your cardiac medications before your next appointment, please call your pharmacy*   Lab Work: None If you have labs (blood work) drawn today and your tests are completely normal, you will receive your results only by: Marland Kitchen MyChart Message (if you have MyChart) OR . A paper copy in the mail If you have any lab test that is abnormal or we need to change your treatment, we will call you to review the results.   Testing/Procedures: Your physician has recommended that you wear an event monitor. Event monitors are medical devices that record the heart's electrical activity. Doctors most often [place] these monitors to diagnose arrhythmias. Arrhythmias are problems with the speed or rhythm of the heartbeat. The monitor is a small, portable device. You can wear one while you do your  normal daily activities. This is usually used to diagnose what is causing  palpitations/syncope (passing out).   Follow-Up: At [facility], you and your health needs are our priority.  As part of our continuing mission to provide you with exceptional heart care, we have created designated Provider Care Teams.  These Care Teams include your primary Cardiologist (physician) and Advanced Practice Providers (APPs -  Physician Assistants and Nurse Practitioners) who all work together to provide you with the care you need, when you need it.  We recommend signing up for the patient portal called "MyChart".  Sign up information is provided on this After Visit Summary.  MyChart is used to connect with patients for Virtual Visits (Telemedicine).  Patients are able to view lab/test results, encounter notes, upcoming appointments, etc.  Non-urgent messages can be sent to your provider as well.   To learn more about what you can do with MyChart, go to [email or web address].    Your next appointment:   6 month(s)  The format for your next appointment:   In Person  Provider:   You may see Sinclair Grooms, MD or one of the following Advanced Practice Providers on your designated Care Team:    Kathyrn Drown, NP    Other Instructions  [employer] Cardiac Event Monitor Instructions Your physician has requested you wear your cardiac event monitor for 30 days. [employer] may call or text to confirm a shipping address. The monitor will be sent to a land address via UPS. [employer] will not ship a monitor to a PO BOX. It typically takes 3-5 days to receive your monitor after it has been enrolled. [employer] will assist with USPS tracking if your package is delayed. The telephone number for [employer] is [phone]. Once you have received your monitor, please review the enclosed instructions. Instruction tutorials can also be viewed under help and settings on the enclosed cell phone. Your monitor has  already been registered assigning a specific monitor serial # to you.  Applying the monitor Remove cell phone from case and turn it on. The cell phone works as Dealer and needs to be within [employer] of you at all times. The cell phone will need to be charged on a daily basis. We recommend you plug the cell phone into the enclosed charger at your bedside table every night.  Monitor batteries: You will receive two monitor batteries labelled #1 and #2. These are your recorders. Plug battery #2 onto the second connection on the enclosed charger. Keep one battery on the charger at all times. This will keep the monitor battery deactivated. It will also keep it fully charged for when you need to switch your monitor batteries. A small light will be blinking on the battery emblem when it is charging. The light on the battery emblem will remain on when the battery is fully charged.  Open package of a Monitor strip. Insert battery #1 into black hood on strip and gently squeeze monitor battery onto connection as indicated in instruction booklet. Set aside while preparing skin.  Choose location for your strip, vertical or horizontal, as indicated in the instruction booklet. Shave to remove all hair from location. There cannot be any lotions, oils, powders, or colognes on skin where monitor is to be applied. Wipe skin clean with enclosed Saline wipe. Dry skin completely.  Peel paper labeled #1 off the back of the Monitor strip exposing the adhesive. Place the monitor on the chest in the vertical or horizontal position shown in the instruction booklet. One arrow on the monitor strip must be pointing  upward. Carefully remove paper labeled #2, attaching remainder of strip to your skin. Try not to create any folds or wrinkles in the strip as you apply it.  Firmly press and release the circle in the center of the monitor battery. You will hear a small beep. This is turning the monitor battery on.  The heart emblem on the monitor battery will light up every 5 seconds if the monitor battery in turned on and connected to the patient securely. Do not push and hold the circle down as this turns the monitor battery off. The cell phone will locate the monitor battery. A screen will appear on the cell phone checking the connection of your monitor strip. This may read poor connection initially but change to good connection within the next minute. Once your monitor accepts the connection you will hear a series of 3 beeps followed by a climbing crescendo of beeps. A screen will appear on the cell phone showing the two monitor strip placement options. Touch the picture that demonstrates where you applied the monitor strip.  Your monitor strip and battery are waterproof. You are able to shower, bathe, or swim with the monitor on. They just ask you do not submerge deeper than 3 feet underwater. We recommend removing the monitor if you are swimming in a lake, river, or ocean.  Your monitor battery will need to be switched to a fully charged monitor battery approximately once a week. The cell phone will alert you of an action which needs to be made.  On the cell phone, tap for details to reveal connection status, monitor battery status, and cell phone battery status. The green dots indicates your monitor is in good status. A red dot indicates there is something that needs your attention.  To record a symptom, click the circle on the monitor battery. In 30-60 seconds a list of symptoms will appear on the cell phone. Select your symptom and tap save. Your monitor will record a sustained or significant arrhythmia regardless of you clicking the button. Some patients do not feel the heart rhythm irregularities. [employer] will notify us of any serious or critical events.  Refer to instruction booklet for instructions on switching batteries, changing strips, the Do not disturb or Pause features, or any  additional questions.  Call [employer] at [phone], to confirm your monitor is transmitting and record your baseline. They will answer any questions you may have regarding the monitor instructions at that time.  Returning the monitor to [place] all equipment back into blue box. Peel off strip of paper to expose adhesive and close box securely. There is a prepaid UPS shipping label on this box. Drop in a UPS drop box, or at a UPS facility like [employer]. You may also contact [employer] to arrange UPS to pick up monitor package at your home.      Signed, Sinclair Grooms, MD  06/11/2020 1:00 PM    [place]

## 2020-06-11 ENCOUNTER — Ambulatory Visit: Payer: Medicare Other | Admitting: Interventional Cardiology

## 2020-06-11 ENCOUNTER — Other Ambulatory Visit: Payer: Self-pay

## 2020-06-11 ENCOUNTER — Encounter: Payer: Self-pay | Admitting: *Deleted

## 2020-06-11 ENCOUNTER — Encounter: Payer: Self-pay | Admitting: Interventional Cardiology

## 2020-06-11 VITALS — BP 128/70 | HR 82 | Ht 71.0 in | Wt 225.0 lb

## 2020-06-11 DIAGNOSIS — I440 Atrioventricular block, first degree: Secondary | ICD-10-CM

## 2020-06-11 DIAGNOSIS — I10 Essential (primary) hypertension: Secondary | ICD-10-CM | POA: Diagnosis not present

## 2020-06-11 DIAGNOSIS — R001 Bradycardia, unspecified: Secondary | ICD-10-CM

## 2020-06-11 DIAGNOSIS — R000 Tachycardia, unspecified: Secondary | ICD-10-CM

## 2020-06-11 DIAGNOSIS — N184 Chronic kidney disease, stage 4 (severe): Secondary | ICD-10-CM

## 2020-06-11 DIAGNOSIS — I2510 Atherosclerotic heart disease of native coronary artery without angina pectoris: Secondary | ICD-10-CM | POA: Diagnosis not present

## 2020-06-11 NOTE — Patient Instructions (Addendum)
Medication Instructions:  Your physician recommends that you continue on your current medications as directed. Please refer to the Current Medication list given to you today.  *If you need a refill on your cardiac medications before your next appointment, please call your pharmacy*   Lab Work: None If you have labs (blood work) drawn today and your tests are completely normal, you will receive your results only by: Marland Kitchen MyChart Message (if you have MyChart) OR . A paper copy in the mail If you have any lab test that is abnormal or we need to change your treatment, we will call you to review the results.   Testing/Procedures: Your physician has recommended that you wear an event monitor. Event monitors are medical devices that record the heart's electrical activity. Doctors most often [place] these monitors to diagnose arrhythmias. Arrhythmias are problems with the speed or rhythm of the heartbeat. The monitor is a small, portable device. You can wear one while you do your normal daily activities. This is usually used to diagnose what is causing palpitations/syncope (passing out).   Follow-Up: At [facility], you and your health needs are our priority.  As part of our continuing mission to provide you with exceptional heart care, we have created designated Provider Care Teams.  These Care Teams include your primary Cardiologist (physician) and Advanced Practice Providers (APPs -  Physician Assistants and Nurse Practitioners) who all work together to provide you with the care you need, when you need it.  We recommend signing up for the patient portal called "MyChart".  Sign up information is provided on this After Visit Summary.  MyChart is used to connect with patients for Virtual Visits (Telemedicine).  Patients are able to view lab/test results, encounter notes, upcoming appointments, etc.  Non-urgent messages can be sent to your provider as well.   To learn more about what you can do with MyChart,  go to [email or web address].    Your next appointment:   6 month(s)  The format for your next appointment:   In Person  Provider:   You may see Sinclair Grooms, MD or one of the following Advanced Practice Providers on your designated Care Team:    Kathyrn Drown, NP    Other Instructions  [employer] Cardiac Event Monitor Instructions Your physician has requested you wear your cardiac event monitor for 30 days. [employer] may call or text to confirm a shipping address. The monitor will be sent to a land address via UPS. [employer] will not ship a monitor to a PO BOX. It typically takes 3-5 days to receive your monitor after it has been enrolled. [employer] will assist with USPS tracking if your package is delayed. The telephone number for [employer] is [phone]. Once you have received your monitor, please review the enclosed instructions. Instruction tutorials can also be viewed under help and settings on the enclosed cell phone. Your monitor has already been registered assigning a specific monitor serial # to you.  Applying the monitor Remove cell phone from case and turn it on. The cell phone works as Dealer and needs to be within [employer] of you at all times. The cell phone will need to be charged on a daily basis. We recommend you plug the cell phone into the enclosed charger at your bedside table every night.  Monitor batteries: You will receive two monitor batteries labelled #1 and #2. These are your recorders. Plug battery #2 onto the second connection on the enclosed charger. Keep one battery  on the charger at all times. This will keep the monitor battery deactivated. It will also keep it fully charged for when you need to switch your monitor batteries. A small light will be blinking on the battery emblem when it is charging. The light on the battery emblem will remain on when the battery is fully charged.  Open package of a Monitor strip. Insert  battery #1 into black hood on strip and gently squeeze monitor battery onto connection as indicated in instruction booklet. Set aside while preparing skin.  Choose location for your strip, vertical or horizontal, as indicated in the instruction booklet. Shave to remove all hair from location. There cannot be any lotions, oils, powders, or colognes on skin where monitor is to be applied. Wipe skin clean with enclosed Saline wipe. Dry skin completely.  Peel paper labeled #1 off the back of the Monitor strip exposing the adhesive. Place the monitor on the chest in the vertical or horizontal position shown in the instruction booklet. One arrow on the monitor strip must be pointing upward. Carefully remove paper labeled #2, attaching remainder of strip to your skin. Try not to create any folds or wrinkles in the strip as you apply it.  Firmly press and release the circle in the center of the monitor battery. You will hear a small beep. This is turning the monitor battery on. The heart emblem on the monitor battery will light up every 5 seconds if the monitor battery in turned on and connected to the patient securely. Do not push and hold the circle down as this turns the monitor battery off. The cell phone will locate the monitor battery. A screen will appear on the cell phone checking the connection of your monitor strip. This may read poor connection initially but change to good connection within the next minute. Once your monitor accepts the connection you will hear a series of 3 beeps followed by a climbing crescendo of beeps. A screen will appear on the cell phone showing the two monitor strip placement options. Touch the picture that demonstrates where you applied the monitor strip.  Your monitor strip and battery are waterproof. You are able to shower, bathe, or swim with the monitor on. They just ask you do not submerge deeper than 3 feet underwater. We recommend removing the monitor if you  are swimming in a lake, river, or ocean.  Your monitor battery will need to be switched to a fully charged monitor battery approximately once a week. The cell phone will alert you of an action which needs to be made.  On the cell phone, tap for details to reveal connection status, monitor battery status, and cell phone battery status. The green dots indicates your monitor is in good status. A red dot indicates there is something that needs your attention.  To record a symptom, click the circle on the monitor battery. In 30-60 seconds a list of symptoms will appear on the cell phone. Select your symptom and tap save. Your monitor will record a sustained or significant arrhythmia regardless of you clicking the button. Some patients do not feel the heart rhythm irregularities. [employer] will notify us of any serious or critical events.  Refer to instruction booklet for instructions on switching batteries, changing strips, the Do not disturb or Pause features, or any additional questions.  Call [employer] at [phone], to confirm your monitor is transmitting and record your baseline. They will answer any questions you may have regarding the monitor instructions at  that time.  Returning the monitor to [place] all equipment back into blue box. Peel off strip of paper to expose adhesive and close box securely. There is a prepaid UPS shipping label on this box. Drop in a UPS drop box, or at a UPS facility like [employer]. You may also contact [employer] to arrange UPS to pick up monitor package at your home.

## 2020-06-11 NOTE — Progress Notes (Signed)
Patient ID: Martin Richardson, male   DOB: 1925-04-05, 87 y.o.   MRN: [id number] Patient enrolled for [employer] to ship a 30 day cardiac event monitor to his home.

## 2020-06-14 ENCOUNTER — Ambulatory Visit (INDEPENDENT_AMBULATORY_CARE_PROVIDER_SITE_OTHER): Payer: Medicare Other

## 2020-06-14 DIAGNOSIS — R001 Bradycardia, unspecified: Secondary | ICD-10-CM

## 2020-06-14 DIAGNOSIS — R002 Palpitations: Secondary | ICD-10-CM

## 2020-06-14 DIAGNOSIS — I440 Atrioventricular block, first degree: Secondary | ICD-10-CM

## 2020-06-14 DIAGNOSIS — R000 Tachycardia, unspecified: Secondary | ICD-10-CM | POA: Diagnosis not present

## 2020-06-15 DIAGNOSIS — I10 Essential (primary) hypertension: Secondary | ICD-10-CM | POA: Diagnosis not present

## 2020-06-15 DIAGNOSIS — E785 Hyperlipidemia, unspecified: Secondary | ICD-10-CM | POA: Diagnosis not present

## 2020-06-15 DIAGNOSIS — K219 Gastro-esophageal reflux disease without esophagitis: Secondary | ICD-10-CM | POA: Diagnosis not present

## 2020-06-15 DIAGNOSIS — E039 Hypothyroidism, unspecified: Secondary | ICD-10-CM | POA: Diagnosis not present

## 2020-06-15 DIAGNOSIS — G4700 Insomnia, unspecified: Secondary | ICD-10-CM | POA: Diagnosis not present

## 2020-06-15 DIAGNOSIS — E119 Type 2 diabetes mellitus without complications: Secondary | ICD-10-CM | POA: Diagnosis not present

## 2020-06-15 DIAGNOSIS — E1169 Type 2 diabetes mellitus with other specified complication: Secondary | ICD-10-CM | POA: Diagnosis not present

## 2020-06-15 DIAGNOSIS — N1832 Chronic kidney disease, stage 3b: Secondary | ICD-10-CM | POA: Diagnosis not present

## 2020-06-15 DIAGNOSIS — I2510 Atherosclerotic heart disease of native coronary artery without angina pectoris: Secondary | ICD-10-CM | POA: Diagnosis not present

## 2020-06-27 DIAGNOSIS — R350 Frequency of micturition: Secondary | ICD-10-CM | POA: Diagnosis not present

## 2020-06-27 DIAGNOSIS — N130 Hydronephrosis with ureteropelvic junction obstruction: Secondary | ICD-10-CM | POA: Diagnosis not present

## 2020-07-11 DIAGNOSIS — Z794 Long term (current) use of insulin: Secondary | ICD-10-CM | POA: Diagnosis not present

## 2020-07-11 DIAGNOSIS — N2581 Secondary hyperparathyroidism of renal origin: Secondary | ICD-10-CM | POA: Diagnosis not present

## 2020-07-11 DIAGNOSIS — D649 Anemia, unspecified: Secondary | ICD-10-CM | POA: Diagnosis not present

## 2020-07-11 DIAGNOSIS — E1169 Type 2 diabetes mellitus with other specified complication: Secondary | ICD-10-CM | POA: Diagnosis not present

## 2020-07-11 DIAGNOSIS — N1832 Chronic kidney disease, stage 3b: Secondary | ICD-10-CM | POA: Diagnosis not present

## 2020-07-11 DIAGNOSIS — I10 Essential (primary) hypertension: Secondary | ICD-10-CM | POA: Diagnosis not present

## 2020-07-18 ENCOUNTER — Other Ambulatory Visit: Payer: Self-pay | Admitting: Interventional Cardiology

## 2020-07-18 DIAGNOSIS — R000 Tachycardia, unspecified: Secondary | ICD-10-CM

## 2020-07-18 DIAGNOSIS — I440 Atrioventricular block, first degree: Secondary | ICD-10-CM

## 2020-07-18 DIAGNOSIS — R001 Bradycardia, unspecified: Secondary | ICD-10-CM

## 2020-07-18 DIAGNOSIS — R002 Palpitations: Secondary | ICD-10-CM

## 2020-07-23 ENCOUNTER — Encounter: Payer: Self-pay | Admitting: *Deleted

## 2020-07-31 ENCOUNTER — Ambulatory Visit: Payer: Medicare Other | Admitting: Podiatry

## 2020-07-31 ENCOUNTER — Other Ambulatory Visit: Payer: Self-pay

## 2020-07-31 ENCOUNTER — Encounter: Payer: Self-pay | Admitting: Podiatry

## 2020-07-31 DIAGNOSIS — B351 Tinea unguium: Secondary | ICD-10-CM

## 2020-07-31 DIAGNOSIS — M79676 Pain in unspecified toe(s): Secondary | ICD-10-CM | POA: Diagnosis not present

## 2020-08-01 NOTE — Progress Notes (Signed)
He presents today chief complaint of painful elongated toenails.  Objective: Toenails are long thick yellow dystrophic-like mycotic painful palpation as well as debridement.  No open lesions or wounds are noted.  Assessment: Pain in limb secondary to onychomycosis.  Plan: Debridement of toenails 1 through 5 bilateral.

## 2020-08-07 DIAGNOSIS — M6281 Muscle weakness (generalized): Secondary | ICD-10-CM | POA: Diagnosis not present

## 2020-08-07 DIAGNOSIS — R42 Dizziness and giddiness: Secondary | ICD-10-CM | POA: Diagnosis not present

## 2020-08-07 DIAGNOSIS — H81393 Other peripheral vertigo, bilateral: Secondary | ICD-10-CM | POA: Diagnosis not present

## 2020-08-07 DIAGNOSIS — H8111 Benign paroxysmal vertigo, right ear: Secondary | ICD-10-CM | POA: Diagnosis not present

## 2020-08-07 DIAGNOSIS — M2560 Stiffness of unspecified joint, not elsewhere classified: Secondary | ICD-10-CM | POA: Diagnosis not present

## 2020-08-10 DIAGNOSIS — N1832 Chronic kidney disease, stage 3b: Secondary | ICD-10-CM | POA: Diagnosis not present

## 2020-08-10 DIAGNOSIS — M6281 Muscle weakness (generalized): Secondary | ICD-10-CM | POA: Diagnosis not present

## 2020-08-10 DIAGNOSIS — D649 Anemia, unspecified: Secondary | ICD-10-CM | POA: Diagnosis not present

## 2020-08-10 DIAGNOSIS — H81393 Other peripheral vertigo, bilateral: Secondary | ICD-10-CM | POA: Diagnosis not present

## 2020-08-10 DIAGNOSIS — K219 Gastro-esophageal reflux disease without esophagitis: Secondary | ICD-10-CM | POA: Diagnosis not present

## 2020-08-10 DIAGNOSIS — E785 Hyperlipidemia, unspecified: Secondary | ICD-10-CM | POA: Diagnosis not present

## 2020-08-10 DIAGNOSIS — I10 Essential (primary) hypertension: Secondary | ICD-10-CM | POA: Diagnosis not present

## 2020-08-10 DIAGNOSIS — M2560 Stiffness of unspecified joint, not elsewhere classified: Secondary | ICD-10-CM | POA: Diagnosis not present

## 2020-08-10 DIAGNOSIS — E039 Hypothyroidism, unspecified: Secondary | ICD-10-CM | POA: Diagnosis not present

## 2020-08-10 DIAGNOSIS — I2510 Atherosclerotic heart disease of native coronary artery without angina pectoris: Secondary | ICD-10-CM | POA: Diagnosis not present

## 2020-08-10 DIAGNOSIS — R42 Dizziness and giddiness: Secondary | ICD-10-CM | POA: Diagnosis not present

## 2020-08-10 DIAGNOSIS — N2581 Secondary hyperparathyroidism of renal origin: Secondary | ICD-10-CM | POA: Diagnosis not present

## 2020-08-10 DIAGNOSIS — G4700 Insomnia, unspecified: Secondary | ICD-10-CM | POA: Diagnosis not present

## 2020-08-10 DIAGNOSIS — H8111 Benign paroxysmal vertigo, right ear: Secondary | ICD-10-CM | POA: Diagnosis not present

## 2020-08-10 DIAGNOSIS — E1169 Type 2 diabetes mellitus with other specified complication: Secondary | ICD-10-CM | POA: Diagnosis not present

## 2020-08-10 DIAGNOSIS — E119 Type 2 diabetes mellitus without complications: Secondary | ICD-10-CM | POA: Diagnosis not present

## 2020-08-13 DIAGNOSIS — R42 Dizziness and giddiness: Secondary | ICD-10-CM | POA: Diagnosis not present

## 2020-08-13 DIAGNOSIS — M6281 Muscle weakness (generalized): Secondary | ICD-10-CM | POA: Diagnosis not present

## 2020-08-13 DIAGNOSIS — M2560 Stiffness of unspecified joint, not elsewhere classified: Secondary | ICD-10-CM | POA: Diagnosis not present

## 2020-08-13 DIAGNOSIS — H8111 Benign paroxysmal vertigo, right ear: Secondary | ICD-10-CM | POA: Diagnosis not present

## 2020-08-13 DIAGNOSIS — H81393 Other peripheral vertigo, bilateral: Secondary | ICD-10-CM | POA: Diagnosis not present

## 2020-08-14 DIAGNOSIS — H2589 Other age-related cataract: Secondary | ICD-10-CM | POA: Diagnosis not present

## 2020-08-14 DIAGNOSIS — Z794 Long term (current) use of insulin: Secondary | ICD-10-CM | POA: Diagnosis not present

## 2020-08-14 DIAGNOSIS — H04123 Dry eye syndrome of bilateral lacrimal glands: Secondary | ICD-10-CM | POA: Diagnosis not present

## 2020-08-14 DIAGNOSIS — E119 Type 2 diabetes mellitus without complications: Secondary | ICD-10-CM | POA: Diagnosis not present

## 2020-08-14 DIAGNOSIS — Z961 Presence of intraocular lens: Secondary | ICD-10-CM | POA: Diagnosis not present

## 2020-08-14 DIAGNOSIS — H401130 Primary open-angle glaucoma, bilateral, stage unspecified: Secondary | ICD-10-CM | POA: Diagnosis not present

## 2020-08-16 DIAGNOSIS — M2560 Stiffness of unspecified joint, not elsewhere classified: Secondary | ICD-10-CM | POA: Diagnosis not present

## 2020-08-16 DIAGNOSIS — M6281 Muscle weakness (generalized): Secondary | ICD-10-CM | POA: Diagnosis not present

## 2020-08-16 DIAGNOSIS — R42 Dizziness and giddiness: Secondary | ICD-10-CM | POA: Diagnosis not present

## 2020-08-16 DIAGNOSIS — H81393 Other peripheral vertigo, bilateral: Secondary | ICD-10-CM | POA: Diagnosis not present

## 2020-08-16 DIAGNOSIS — H8111 Benign paroxysmal vertigo, right ear: Secondary | ICD-10-CM | POA: Diagnosis not present

## 2020-08-21 ENCOUNTER — Encounter: Payer: Medicare Other | Attending: Family Medicine | Admitting: Skilled Nursing Facility1

## 2020-08-21 ENCOUNTER — Other Ambulatory Visit: Payer: Self-pay

## 2020-08-21 ENCOUNTER — Encounter: Payer: Self-pay | Admitting: Skilled Nursing Facility1

## 2020-08-21 DIAGNOSIS — N184 Chronic kidney disease, stage 4 (severe): Secondary | ICD-10-CM | POA: Insufficient documentation

## 2020-08-21 DIAGNOSIS — E1121 Type 2 diabetes mellitus with diabetic nephropathy: Secondary | ICD-10-CM | POA: Insufficient documentation

## 2020-08-21 NOTE — Progress Notes (Signed)
Pt arrives with his daughter who states her fathers blood sugars are inconsistent.  Pt states he lives alone.  Pt states he takes his insulin daily 40 in the morning and 30 in the evening.  Pt states when his blood sugars he feels weak and nervous and sweaty.  Pt states he used to love cooking but hates it now.  Pt states he does not check his blood sugars as regularly as he used to. Pt state she wants his blood sugars fasting to be 125.  Pt states his last low blood sugar was about 10 days ago. Pt states if he eats a sensible supper his blood sugar will be alright.  Pt states he has not really has not been as active as he was due to intermittent vertigo.  Pt states he makes sure to drink enough water to keep his bowels going.  Pt states when his blood sugar is low he drinks orange juice and eats a sweet food stating he used to be and about overeating.   Pt states his usual weight from high school was 220 pounds.  Medications: Diabetes: Glargine 40 in the morning and 35 in the evening Glimepiride   Labs:  Creatinine 1.97 GFR 32 A1C 7.9  Goals: Check your blood sugars daily due to you about to change your diet Eat most meals from home Include non starchy vegetables 2 times a day Continue to work on getting in enough water If a low drink 4 ounces of juice wait 15 minutes and then eat a meal with protein if blood sugars in the 80's-90's If lack of appetite and losing weight to 210-200 pounds drink supplements to replace lost calories    Diabetes Self-Management Education  Visit Type: First/Initial   08/21/2020  [name], identified by name and date of birth, is a 87 y.o. male with a diagnosis of Diabetes: Type 2.   ASSESSMENT  Height 5' 11.5" (1.816 m), weight 234 lb (106.1 kg). Body mass index is 32.18 kg/m.   Diabetes Self-Management Education - 08/21/20 1103       Visit Information   Visit Type First/Initial      Initial Visit   Diabetes Type Type 2    Are  you currently following a meal plan? No    Are you taking your medications as prescribed? Yes      Health Coping   How would you rate your overall health? Good      Psychosocial Assessment   Patient Belief/Attitude about Diabetes Motivated to manage diabetes    Self-care barriers None    Self-management support Friends;Family    Other persons present Family Member    Patient Concerns Nutrition/Meal planning    Special Needs None    Learning Readiness Ready    How often do you need to have someone help you when you read instructions, pamphlets, or other written materials from your doctor or pharmacy? 1 - Never      Pre-Education Assessment   Patient understands the diabetes disease and treatment process. Needs Review    Patient understands incorporating nutritional management into lifestyle. Needs Review    Patient undertands incorporating physical activity into lifestyle. Needs Review    Patient understands using medications safely. Needs Review    Patient understands monitoring blood glucose, interpreting and using results Needs Review    Patient understands prevention, detection, and treatment of acute complications. Needs Review    Patient understands prevention, detection, and treatment of chronic complications. Needs Review  Patient understands how to develop strategies to address psychosocial issues. Needs Review    Patient understands how to develop strategies to promote health/change behavior. Needs Review      Complications   Last HgB A1C per patient/outside source 7.9 %    How often do you check your blood sugar? 1-2 times/day    Fasting Blood glucose range (mg/dL) 130-179    Number of hypoglycemic episodes per month 2    Can you tell when your blood sugar is low? Yes      Dietary Intake   Breakfast [place] sausage + 1 egg + multigrain toast    Lunch hamburger + pinto beans    Dinner BLT sandwich + chips    Beverage(s) diet soda, coffee, water      Exercise    Exercise Type ADL's    How many days per week to you exercise? 0    How many minutes per day do you exercise? 0    Total minutes per week of exercise 0      Patient Education   Previous Diabetes Education Yes (please comment)    Disease state  Factors that contribute to the development of diabetes    Acute complications Taught treatment of hypoglycemia - the 15 rule.      Individualized Goals (developed by patient)   Nutrition Follow meal plan discussed;General guidelines for healthy choices and portions discussed    Physical Activity Exercise 5-7 days per week;30 minutes per day    Monitoring  test my blood glucose as discussed      Post-Education Assessment   Patient understands the diabetes disease and treatment process. Demonstrates understanding / competency    Patient understands incorporating nutritional management into lifestyle. Demonstrates understanding / competency    Patient undertands incorporating physical activity into lifestyle. Demonstrates understanding / competency    Patient understands using medications safely. Demonstrates understanding / competency    Patient understands monitoring blood glucose, interpreting and using results Demonstrates understanding / competency    Patient understands prevention, detection, and treatment of acute complications. Demonstrates understanding / competency    Patient understands prevention, detection, and treatment of chronic complications. Demonstrates understanding / competency    Patient understands how to develop strategies to address psychosocial issues. Demonstrates understanding / competency    Patient understands how to develop strategies to promote health/change behavior. Demonstrates understanding / competency      Outcomes   Expected Outcomes Demonstrated interest in learning. Expect positive outcomes    Future DMSE 4-6 wks    Program Status Completed             Individualized Plan for Diabetes Self-Management  Training:   Learning Objective:  Patient will have a greater understanding of diabetes self-management. Patient education plan is to attend individual and/or group sessions per assessed needs and concerns.    Expected Outcomes:  Demonstrated interest in learning. Expect positive outcomes  Education material provided: ADA - How to Thrive: A Guide for Your Journey with Diabetes, Meal plan card, and My Plate  If problems or questions, patient to contact team via:  Phone  Future DSME appointment: 4-6 wks

## 2020-09-17 DIAGNOSIS — N184 Chronic kidney disease, stage 4 (severe): Secondary | ICD-10-CM | POA: Diagnosis not present

## 2020-09-19 DIAGNOSIS — U071 COVID-19: Secondary | ICD-10-CM | POA: Diagnosis not present

## 2020-09-19 DIAGNOSIS — J029 Acute pharyngitis, unspecified: Secondary | ICD-10-CM | POA: Diagnosis not present

## 2020-10-04 DIAGNOSIS — R3914 Feeling of incomplete bladder emptying: Secondary | ICD-10-CM | POA: Diagnosis not present

## 2020-10-04 DIAGNOSIS — N130 Hydronephrosis with ureteropelvic junction obstruction: Secondary | ICD-10-CM | POA: Diagnosis not present

## 2020-10-07 ENCOUNTER — Encounter (HOSPITAL_COMMUNITY): Payer: Self-pay | Admitting: Internal Medicine

## 2020-10-07 ENCOUNTER — Emergency Department (HOSPITAL_COMMUNITY): Payer: Medicare Other

## 2020-10-07 ENCOUNTER — Observation Stay (HOSPITAL_BASED_OUTPATIENT_CLINIC_OR_DEPARTMENT_OTHER): Payer: Medicare Other

## 2020-10-07 ENCOUNTER — Other Ambulatory Visit: Payer: Self-pay

## 2020-10-07 ENCOUNTER — Observation Stay (HOSPITAL_COMMUNITY)
Admission: EM | Admit: 2020-10-07 | Discharge: 2020-10-08 | Disposition: A | Discharge status: Home / Self Care | Payer: Medicare Other | Attending: Internal Medicine | Admitting: Internal Medicine

## 2020-10-07 DIAGNOSIS — D170 Benign lipomatous neoplasm of skin and subcutaneous tissue of head, face and neck: Secondary | ICD-10-CM | POA: Diagnosis not present

## 2020-10-07 DIAGNOSIS — Z20822 Contact with and (suspected) exposure to covid-19: Secondary | ICD-10-CM | POA: Insufficient documentation

## 2020-10-07 DIAGNOSIS — I2510 Atherosclerotic heart disease of native coronary artery without angina pectoris: Secondary | ICD-10-CM | POA: Insufficient documentation

## 2020-10-07 DIAGNOSIS — R29810 Facial weakness: Secondary | ICD-10-CM | POA: Diagnosis not present

## 2020-10-07 DIAGNOSIS — I639 Cerebral infarction, unspecified: Principal | ICD-10-CM | POA: Diagnosis present

## 2020-10-07 DIAGNOSIS — Z794 Long term (current) use of insulin: Secondary | ICD-10-CM | POA: Diagnosis not present

## 2020-10-07 DIAGNOSIS — Z9181 History of falling: Secondary | ICD-10-CM | POA: Diagnosis not present

## 2020-10-07 DIAGNOSIS — Z7984 Long term (current) use of oral hypoglycemic drugs: Secondary | ICD-10-CM | POA: Diagnosis not present

## 2020-10-07 DIAGNOSIS — R4701 Aphasia: Secondary | ICD-10-CM

## 2020-10-07 DIAGNOSIS — G319 Degenerative disease of nervous system, unspecified: Secondary | ICD-10-CM | POA: Diagnosis not present

## 2020-10-07 DIAGNOSIS — E039 Hypothyroidism, unspecified: Secondary | ICD-10-CM | POA: Diagnosis present

## 2020-10-07 DIAGNOSIS — I499 Cardiac arrhythmia, unspecified: Secondary | ICD-10-CM | POA: Diagnosis not present

## 2020-10-07 DIAGNOSIS — I10 Essential (primary) hypertension: Secondary | ICD-10-CM | POA: Diagnosis not present

## 2020-10-07 DIAGNOSIS — Z743 Need for continuous supervision: Secondary | ICD-10-CM | POA: Diagnosis not present

## 2020-10-07 DIAGNOSIS — E1122 Type 2 diabetes mellitus with diabetic chronic kidney disease: Secondary | ICD-10-CM | POA: Insufficient documentation

## 2020-10-07 DIAGNOSIS — Y900 Blood alcohol level of less than 20 mg/100 ml: Secondary | ICD-10-CM | POA: Insufficient documentation

## 2020-10-07 DIAGNOSIS — Z85528 Personal history of other malignant neoplasm of kidney: Secondary | ICD-10-CM | POA: Insufficient documentation

## 2020-10-07 DIAGNOSIS — J3489 Other specified disorders of nose and nasal sinuses: Secondary | ICD-10-CM | POA: Diagnosis not present

## 2020-10-07 DIAGNOSIS — I6389 Other cerebral infarction: Secondary | ICD-10-CM | POA: Diagnosis not present

## 2020-10-07 DIAGNOSIS — R739 Hyperglycemia, unspecified: Secondary | ICD-10-CM | POA: Diagnosis not present

## 2020-10-07 DIAGNOSIS — R6889 Other general symptoms and signs: Secondary | ICD-10-CM | POA: Diagnosis not present

## 2020-10-07 DIAGNOSIS — E1129 Type 2 diabetes mellitus with other diabetic kidney complication: Secondary | ICD-10-CM | POA: Diagnosis present

## 2020-10-07 DIAGNOSIS — Z79899 Other long term (current) drug therapy: Secondary | ICD-10-CM | POA: Insufficient documentation

## 2020-10-07 DIAGNOSIS — G459 Transient cerebral ischemic attack, unspecified: Secondary | ICD-10-CM

## 2020-10-07 DIAGNOSIS — Z7982 Long term (current) use of aspirin: Secondary | ICD-10-CM | POA: Insufficient documentation

## 2020-10-07 DIAGNOSIS — N184 Chronic kidney disease, stage 4 (severe): Secondary | ICD-10-CM | POA: Diagnosis present

## 2020-10-07 DIAGNOSIS — I129 Hypertensive chronic kidney disease with stage 1 through stage 4 chronic kidney disease, or unspecified chronic kidney disease: Secondary | ICD-10-CM | POA: Insufficient documentation

## 2020-10-07 DIAGNOSIS — Z87891 Personal history of nicotine dependence: Secondary | ICD-10-CM | POA: Diagnosis not present

## 2020-10-07 DIAGNOSIS — R29818 Other symptoms and signs involving the nervous system: Secondary | ICD-10-CM | POA: Diagnosis not present

## 2020-10-07 LAB — COMPREHENSIVE METABOLIC PANEL
ALT: 15 U/L (ref 0–44)
AST: 13 U/L — ABNORMAL LOW (ref 15–41)
Albumin: 3.3 g/dL — ABNORMAL LOW (ref 3.5–5.0)
Alkaline Phosphatase: 64 U/L (ref 38–126)
Anion gap: 7 (ref 5–15)
BUN: 37 mg/dL — ABNORMAL HIGH (ref 8–23)
CO2: 20 mmol/L — ABNORMAL LOW (ref 22–32)
Calcium: 9.6 mg/dL (ref 8.9–10.3)
Chloride: 107 mmol/L (ref 98–111)
Creatinine, Ser: 2.08 mg/dL — ABNORMAL HIGH (ref 0.61–1.24)
GFR, Estimated: 29 mL/min — ABNORMAL LOW (ref >60)
Glucose, Bld: 295 mg/dL — ABNORMAL HIGH (ref 70–99)
Potassium: 4.5 mmol/L (ref 3.5–5.1)
Sodium: 134 mmol/L — ABNORMAL LOW (ref 135–145)
Total Bilirubin: 0.6 mg/dL (ref 0.3–1.2)
Total Protein: 6.1 g/dL — ABNORMAL LOW (ref 6.5–8.1)

## 2020-10-07 LAB — ECHOCARDIOGRAM COMPLETE
AR max vel: 3.11 cm2
AV Area VTI: 3.54 cm2
AV Area mean vel: 2.97 cm2
AV Mean grad: 4 mmHg
AV Peak grad: 6 mmHg
Ao pk vel: 1.22 m/s
Area-P 1/2: 2.12 cm2
Height: 71 in
MV VTI: 3.7 cm2
P 1/2 time: 706 msec
S' Lateral: 2.5 cm
Single Plane A4C EF: 53.9 %
Weight: 3689.62 oz

## 2020-10-07 LAB — I-STAT CHEM 8, ED
BUN: 36 mg/dL — ABNORMAL HIGH (ref 8–23)
Calcium, Ion: 1.34 mmol/L (ref 1.15–1.40)
Chloride: 106 mmol/L (ref 98–111)
Creatinine, Ser: 2.1 mg/dL — ABNORMAL HIGH (ref 0.61–1.24)
Glucose, Bld: 286 mg/dL — ABNORMAL HIGH (ref 70–99)
HCT: 36 % — ABNORMAL LOW (ref 39.0–52.0)
Hemoglobin: 12.2 g/dL — ABNORMAL LOW (ref 13.0–17.0)
Potassium: 4.5 mmol/L (ref 3.5–5.1)
Sodium: 137 mmol/L (ref 135–145)
TCO2: 19 mmol/L — ABNORMAL LOW (ref 22–32)

## 2020-10-07 LAB — DIFFERENTIAL
Abs Immature Granulocytes: 0.05 10*3/uL (ref 0.00–0.07)
Basophils Absolute: 0.1 10*3/uL (ref 0.0–0.1)
Basophils Relative: 1 %
Eosinophils Absolute: 0.6 10*3/uL — ABNORMAL HIGH (ref 0.0–0.5)
Eosinophils Relative: 8 %
Immature Granulocytes: 1 %
Lymphocytes Relative: 17 %
Lymphs Abs: 1.2 10*3/uL (ref 0.7–4.0)
Monocytes Absolute: 0.8 10*3/uL (ref 0.1–1.0)
Monocytes Relative: 12 %
Neutro Abs: 4.2 10*3/uL (ref 1.7–7.7)
Neutrophils Relative %: 61 %

## 2020-10-07 LAB — RESP PANEL BY RT-PCR (FLU A&B, COVID) ARPGX2
Influenza A by PCR: NEGATIVE
Influenza B by PCR: NEGATIVE
SARS Coronavirus 2 by RT PCR: NEGATIVE

## 2020-10-07 LAB — PROTIME-INR
INR: 1 (ref 0.8–1.2)
Prothrombin Time: 13.5 seconds (ref 11.4–15.2)

## 2020-10-07 LAB — CBC
HCT: 36.5 % — ABNORMAL LOW (ref 39.0–52.0)
Hemoglobin: 12.5 g/dL — ABNORMAL LOW (ref 13.0–17.0)
MCH: 29.2 pg (ref 26.0–34.0)
MCHC: 34.2 g/dL (ref 30.0–36.0)
MCV: 85.3 fL (ref 80.0–100.0)
Platelets: 185 10*3/uL (ref 150–400)
RBC: 4.28 MIL/uL (ref 4.22–5.81)
RDW: 12.7 % (ref 11.5–15.5)
WBC: 6.8 10*3/uL (ref 4.0–10.5)
nRBC: 0 % (ref 0.0–0.2)

## 2020-10-07 LAB — APTT: aPTT: 30 seconds (ref 24–36)

## 2020-10-07 LAB — CBG MONITORING, ED
Glucose-Capillary: 279 mg/dL — ABNORMAL HIGH (ref 70–99)
Glucose-Capillary: 254 mg/dL — ABNORMAL HIGH (ref 70–99)
Glucose-Capillary: 265 mg/dL — ABNORMAL HIGH (ref 70–99)

## 2020-10-07 LAB — ETHANOL: Alcohol, Ethyl (B): <10 mg/dL (ref <10)

## 2020-10-07 IMAGING — CT CT HEAD CODE STROKE
3 series · 15 of 47 positions shown, 18 images · non-contrast
Comparison: None.

CLINICAL DATA: Code stroke.  [AGE] male with aphasia.

EXAM:
CT HEAD WITHOUT CONTRAST
TECHNIQUE: Contiguous axial images were obtained from the base of the skull
through the vertex without intravenous contrast.

[Series 4: head 5.0 h30s · axial · 0.49mm/px · z∈[-132,+13]mm · 9 of 35 slices shown, 12 images]
[im 3/35  brain]
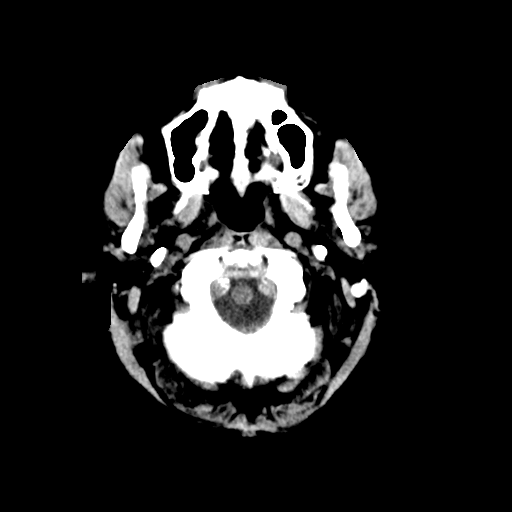
[im 3/35  bone]
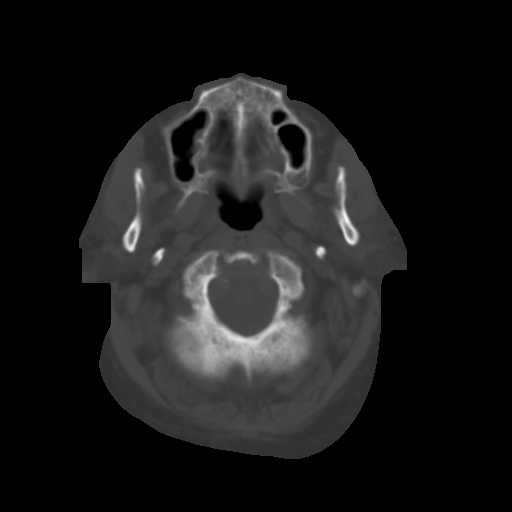
[im 6/35  brain]
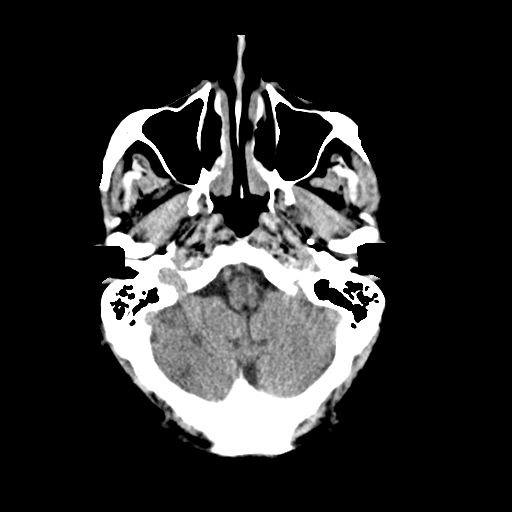
[im 10/35  brain]
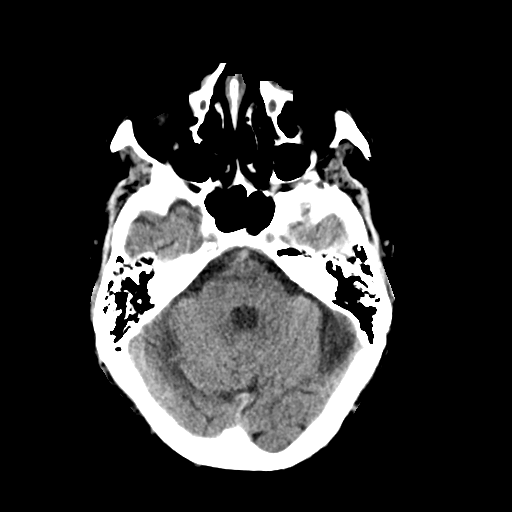
[im 13/35  brain]
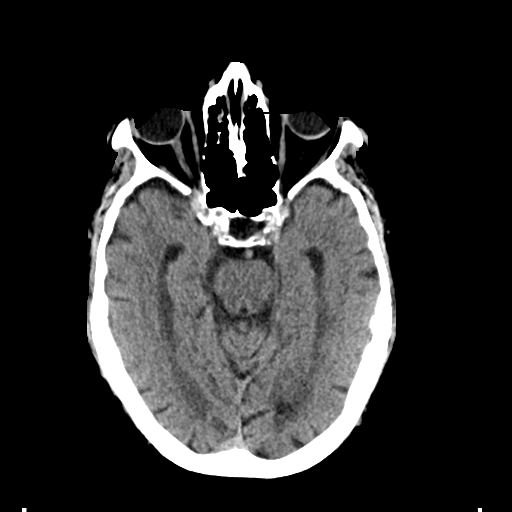
[im 18/35  brain]
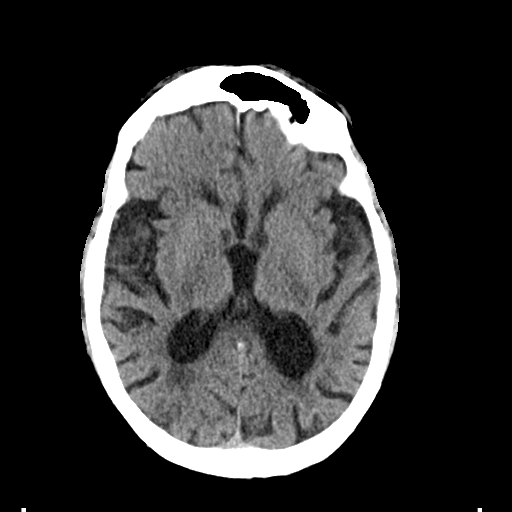
[im 18/35  bone]
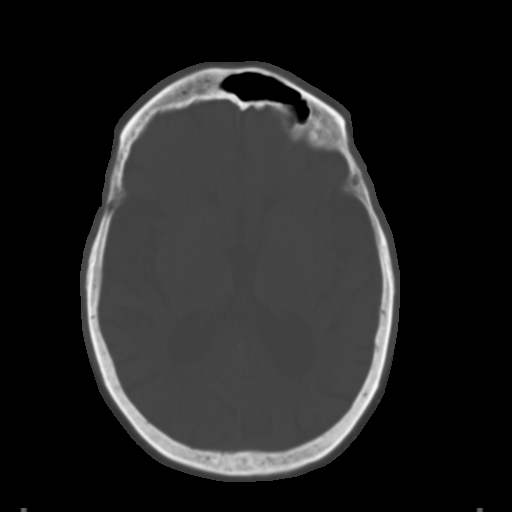
[im 22/35  brain]
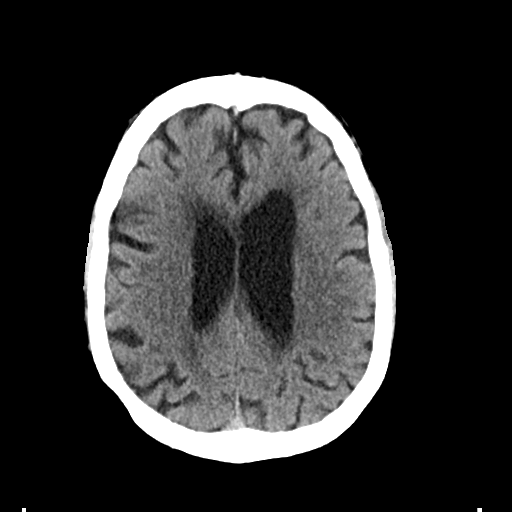
[im 25/35  brain]
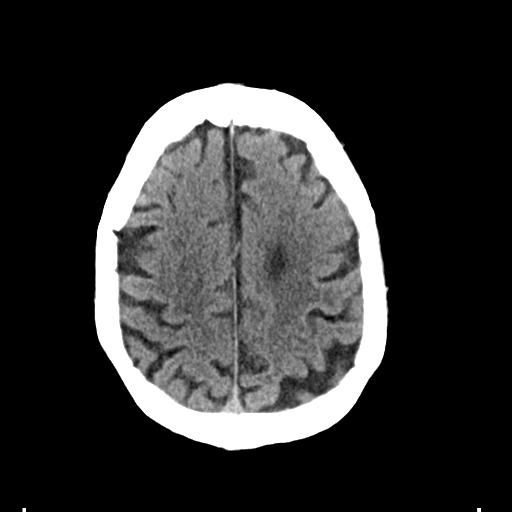
[im 29/35  brain]
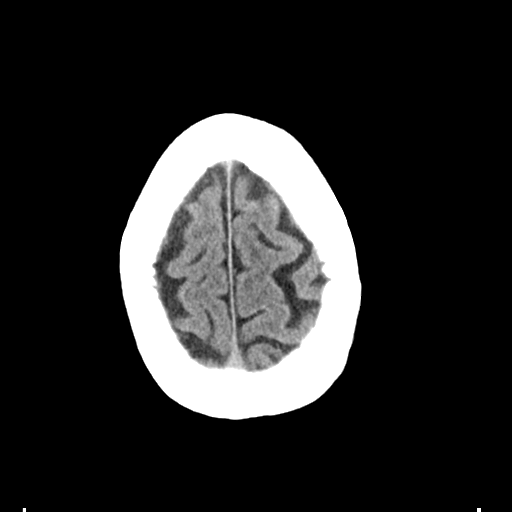
[im 32/35  brain]
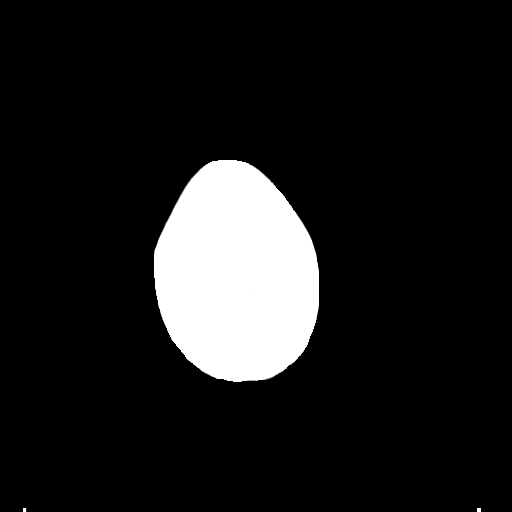
[im 32/35  bone]
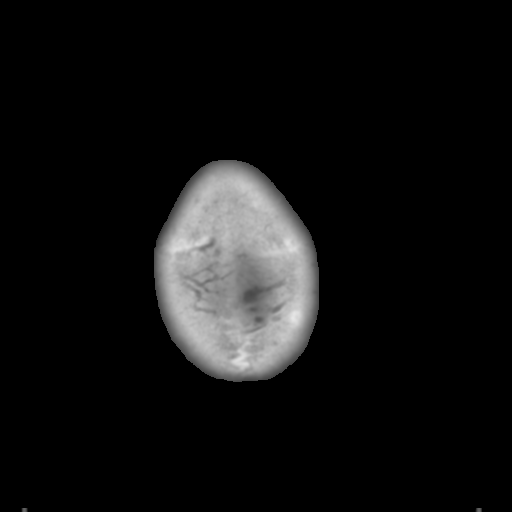

[Series 5: head 3.0 mpr cor · coronal · 0.36mm/px · 3 of 74 slices shown]
[im 25/74  brain]
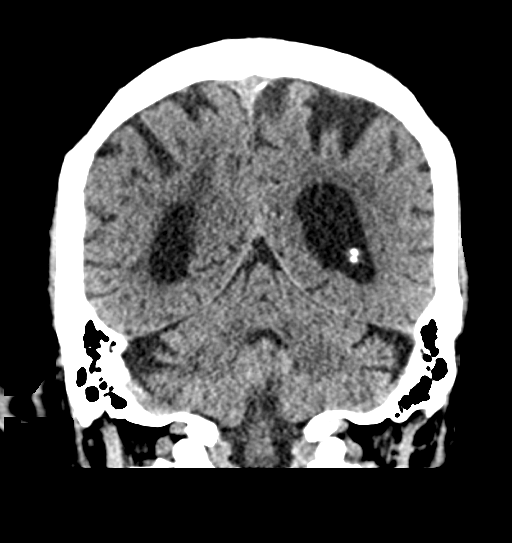
[im 33/74  brain]
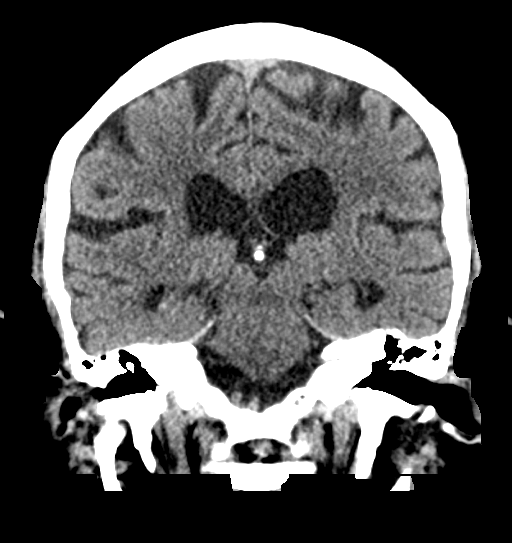
[im 41/74  brain]
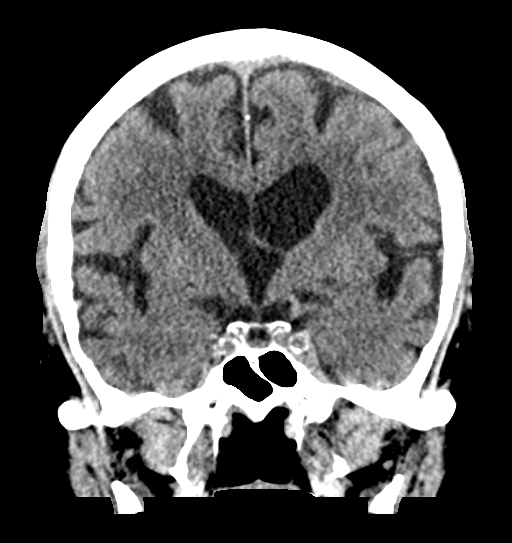

[Series 6: head 3.0 mpr sag · sagittal · 0.35mm/px · 3 of 58 slices shown]
[im 20/58  brain]
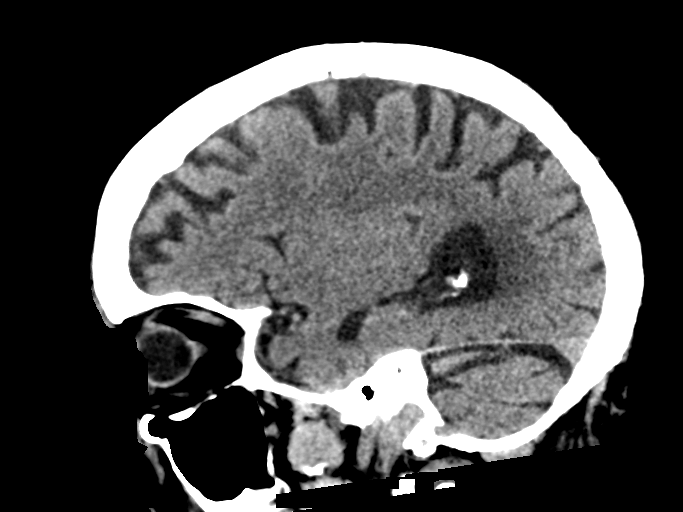
[im 29/58  brain]
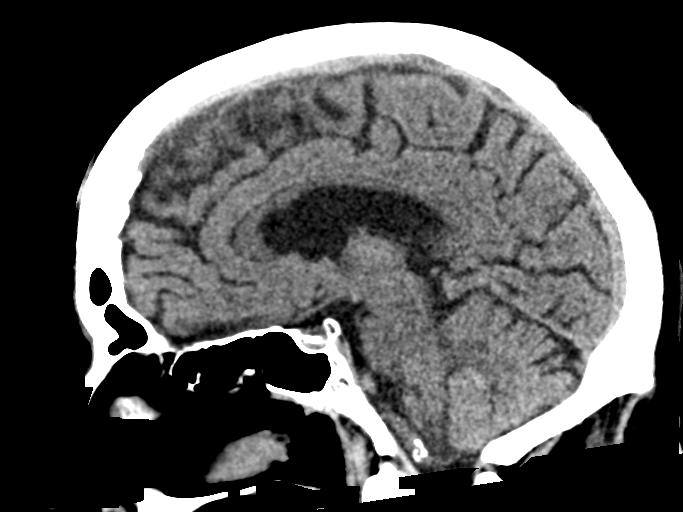
[im 39/58  brain]
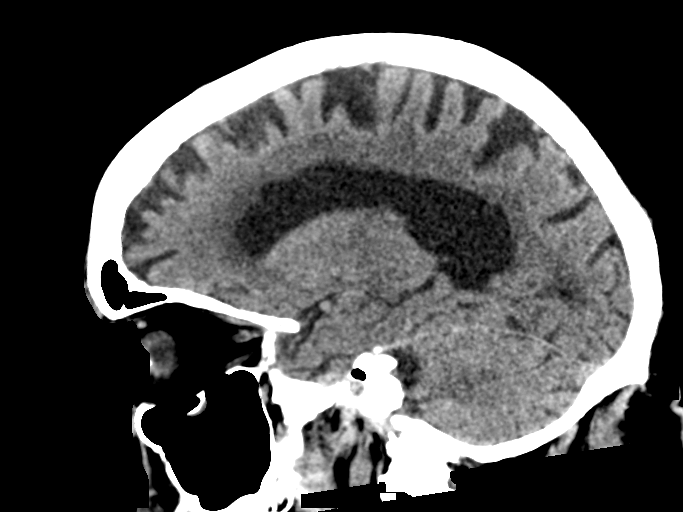

[15 of 47 positions shown; findings below may reference images not displayed]

FINDINGS: Brain: No midline shift, mass effect, or evidence of intracranial
mass lesion. No ventriculomegaly. No acute intracranial hemorrhage
identified.

Chronic appearing small linear infarct in the right cerebellum PICA
territory. Patchy bilateral white matter hypodensity is relatively
symmetric.

No cortically based acute infarct identified. No cortical
encephalomalacia identified.

Vascular: Mild Calcified atherosclerosis at the skull base. No
suspicious intracranial vascular hyperdensity.

Skull: No acute osseous abnormality identified.

Sinuses/Orbits: Mild bilateral paranasal sinus mucosal thickening.
Mild right mastoid effusion.

Other: Small volume retained secretions in the nasopharynx.
Postoperative changes to both globes. No acute orbit or scalp soft
tissue finding. There is a small right forehead subcutaneous lipoma
on series 3, image 58 (benign).

ASPECTS ([place] Stroke Program Early CT Score)

Total score (0-10 with 10 being normal): 10
IMPRESSION: 1. No acute cortically based infarct or acute intracranial
hemorrhage identified. ASPECTS 10.
2. Small chronic appearing Right PICA infarct. Mild for age cerebral
white matter changes.
3. These results were communicated to Dr. AUJLA at [DATE] on
[DATE] by text page via the [employer] messaging system.

## 2020-10-07 IMAGING — MR MR HEAD W/O CM
6 of 11 series · 24 of 48 positions shown · non-contrast
Comparison: Same day CT head.

CLINICAL DATA: Neuro deficit, acute, stroke suspected

EXAM:
MRI HEAD WITHOUT CONTRAST
TECHNIQUE: Multiplanar, multiecho pulse sequences of the brain and surrounding
structures were obtained without intravenous contrast.

[Series 2: DWI · axial · 3.0mm · 0.94mm/px · z∈[-84,+80]mm · 7 of 112 slices shown (1 of 2)]
[im 1/112]
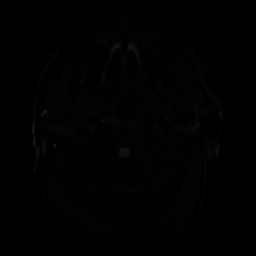
[im 19/112]
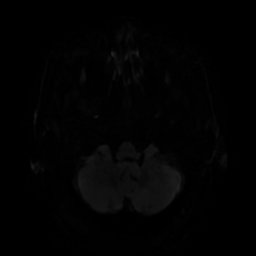
[im 38/112]
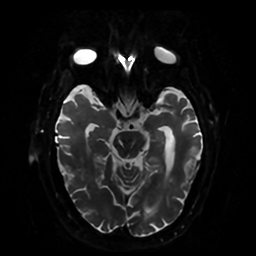
[im 56/112]
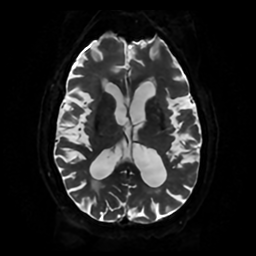
[im 75/112]
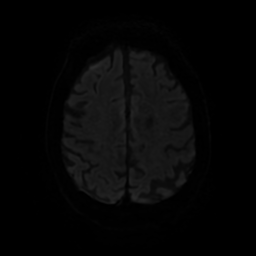
[im 93/112]
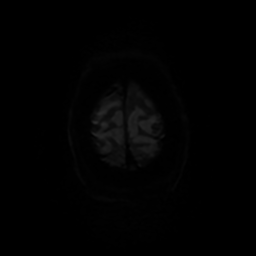
[im 112/112]
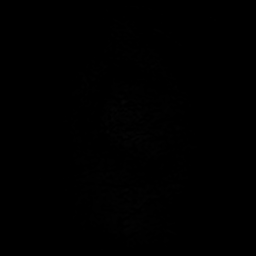

[Series 3: DWI · coronal · 4.0mm · 0.94mm/px · 5 of 78 slices shown (2 of 2)]
[im 1/78]
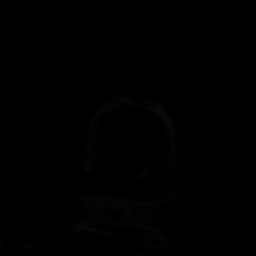
[im 20/78]
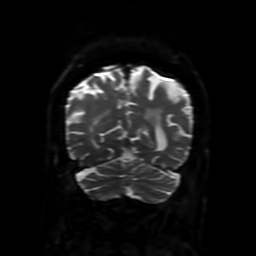
[im 39/78]
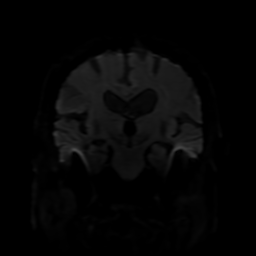
[im 58/78]
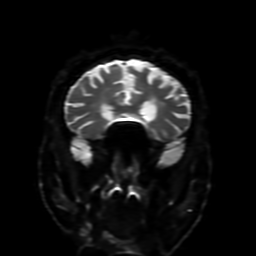
[im 78/78]
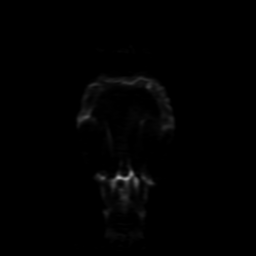

[Series 4: FLAIR · sagittal · 5.0mm · 0.23mm/px · 2 of 30 slices shown (1 of 2)]
[im 1/30]
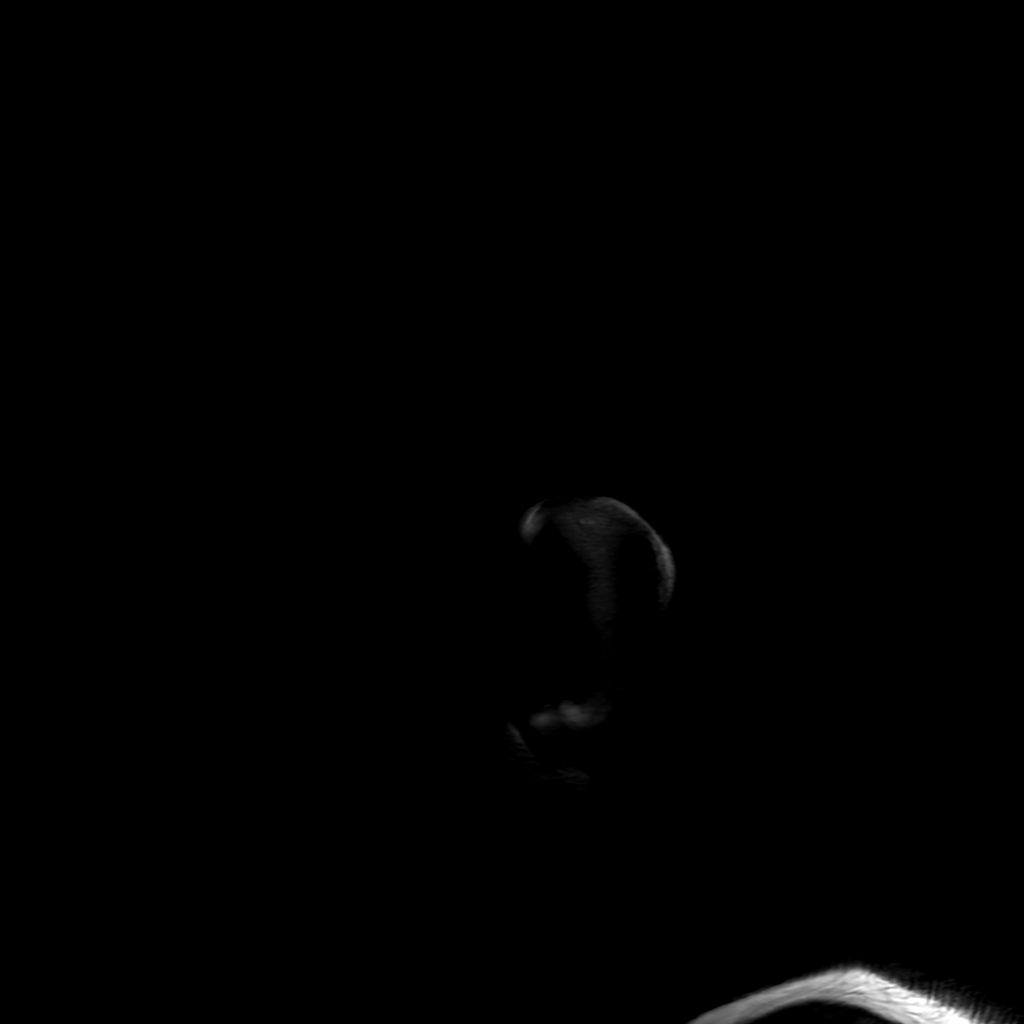
[im 30/30]
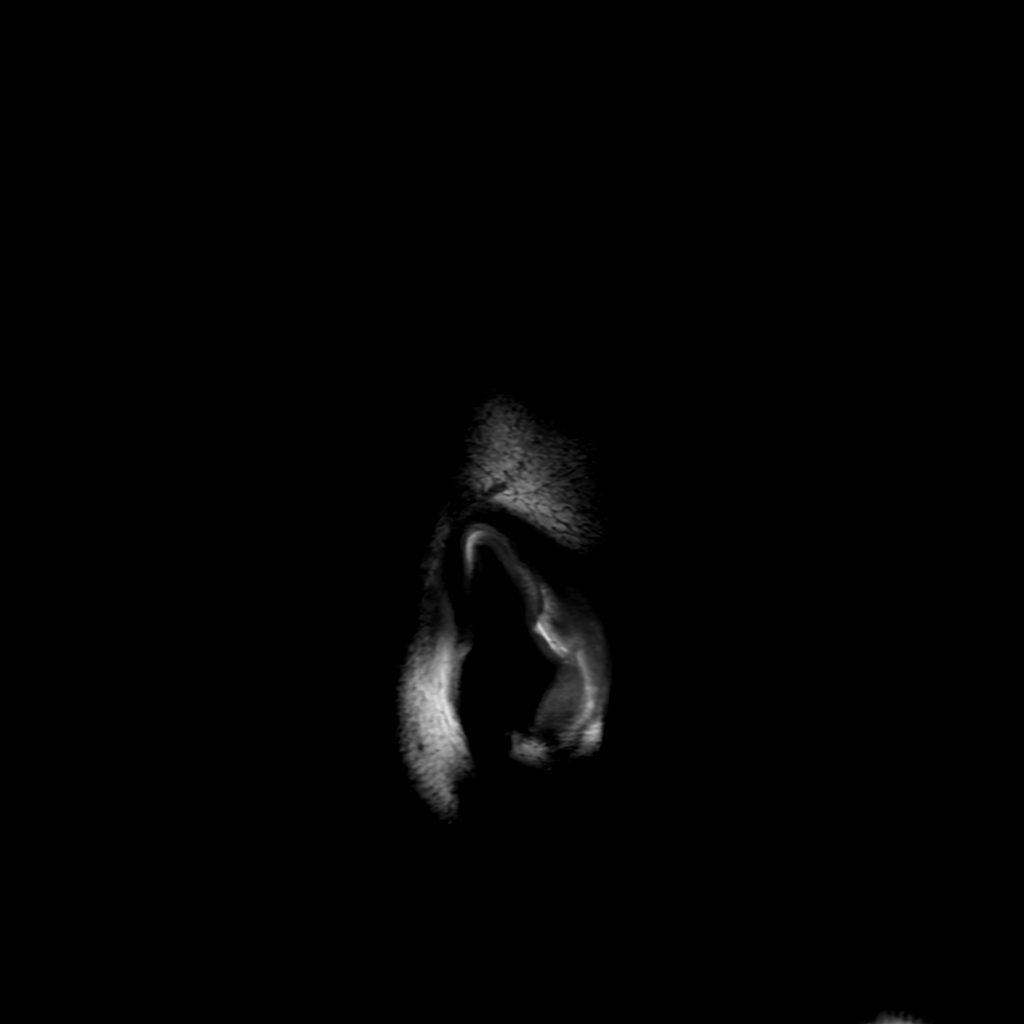

[Series 6: FLAIR · axial · 4.0mm · 0.45mm/px · z∈[-83,+79]mm · 3 of 38 slices shown (2 of 2)]
[im 1/38]
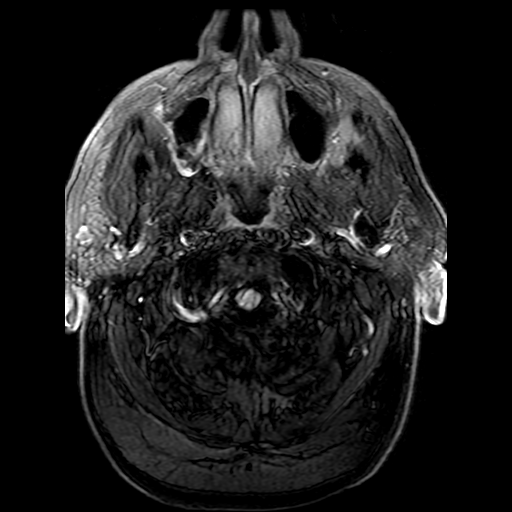
[im 19/38]
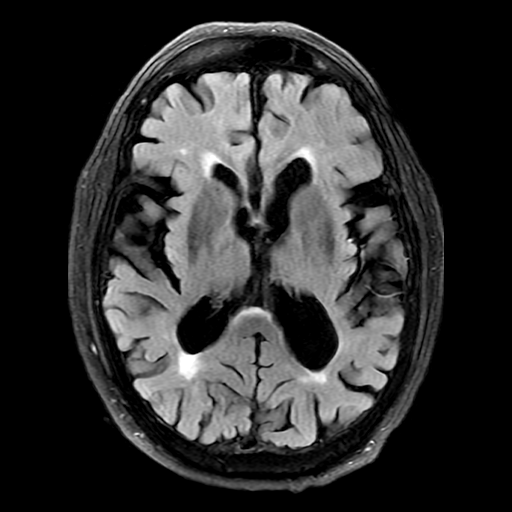
[im 38/38]
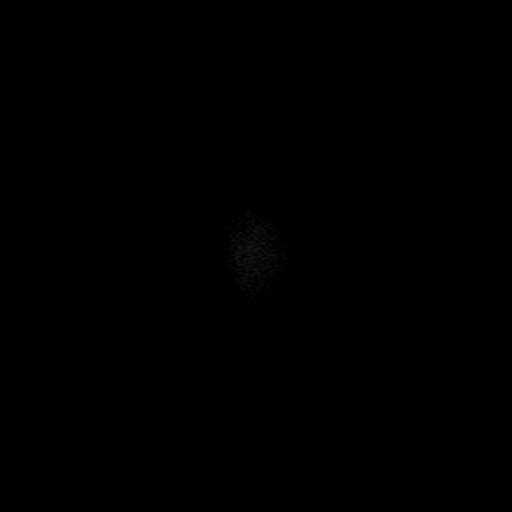

[Series 250: ADC · axial · 3.0mm · 0.94mm/px · z∈[-84,+80]mm · 4 of 56 slices shown (1 of 2)]
[im 1/56]
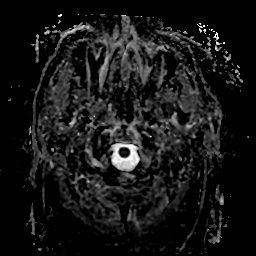
[im 19/56]
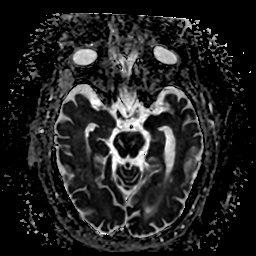
[im 37/56]
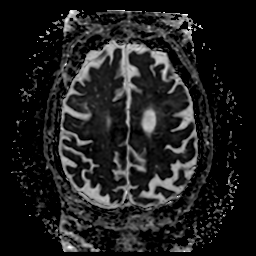
[im 56/56]
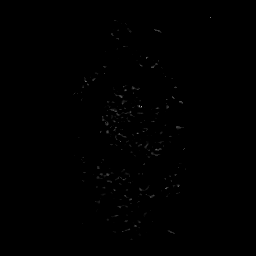

[Series 350: ADC · coronal · 4.0mm · 0.94mm/px · 3 of 38 slices shown (2 of 2)]
[im 1/38]
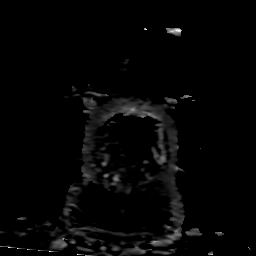
[im 19/38]
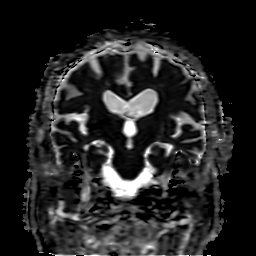
[im 38/38]
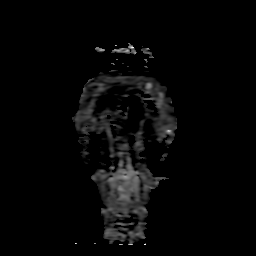

[24 of 48 positions shown; findings below may reference images not displayed]

FINDINGS: Brain: Two small acute infarcts in the right precentral gyrus
(series 2, image 42) and the anterior right frontal white matter
(series 2, image 30). No significant edema or mass effect. Remote
infarcts in the right inferior cerebellum and right temporal lobe
white matter. Additional mild for age scattered T2 hyperintensities
within the white matter, nonspecific but compatible with chronic
microvascular ischemic disease. Mild for age atrophy with ex vacuo
ventricular dilation. No hydrocephalus. No acute hemorrhage, mass
lesion, midline shift, or extra-axial fluid collection. Small focus
of susceptibility artifact within the right frontal white matter,
suggestive of prior microhemorrhage. Mild/faint susceptibility
artifact in the inferior right cerebellum in the region of
encephalomalacia, likely prior petechial hemorrhage.

Vascular: Further evaluated on same day MRA.

Skull and upper cervical spine: Normal marrow signal.

Sinuses/Orbits: Mild paranasal sinus mucosal thickening. No acute
orbital findings.

Other: Small right mastoid effusion.
IMPRESSION: 1. Small acute infarcts in the right precentral gyrus and the
anterior right frontal white matter. No significant edema or mass
effect.
2. Remote infarcts in the right inferior cerebellum and right
temporal lobe.
3. Mild for age chronic microvascular ischemic disease and atrophy.

## 2020-10-07 IMAGING — MR MR MRA HEAD W/O CM
2 series · 19 of 48 positions shown · non-contrast
Comparison: Plain head CT today at [XW] hours. MRI brain is pending
at this time.

CLINICAL DATA: [AGE] male code stroke presentation, aphasia.

EXAM:
MRA HEAD WITHOUT CONTRAST
TECHNIQUE: Angiographic images of the Circle of Willis were acquired using MRA
technique without intravenous contrast.

[Series 2: ax (id) · axial · 1.0mm · 0.43mm/px · z∈[-71,+22]mm · 18 of 197 slices shown]
[im 1/197]
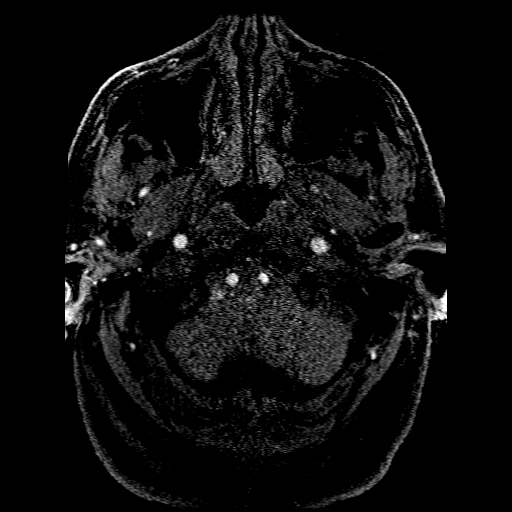
[im 5/197]
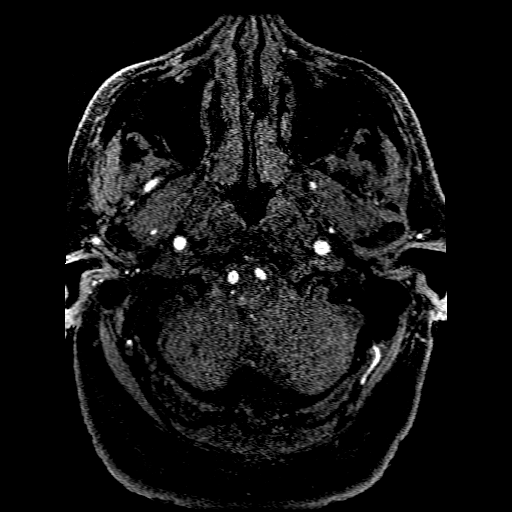
[im 9/197]
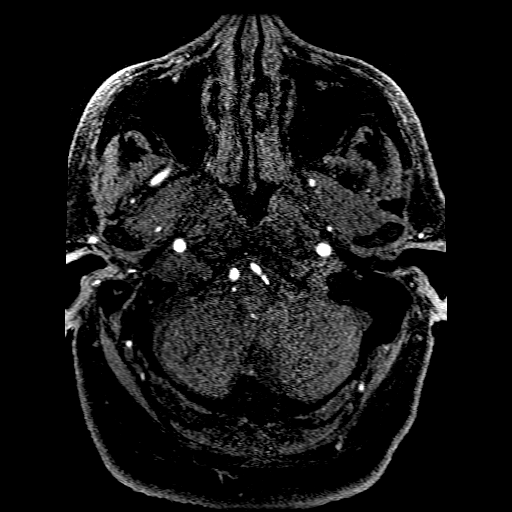
[im 13/197]
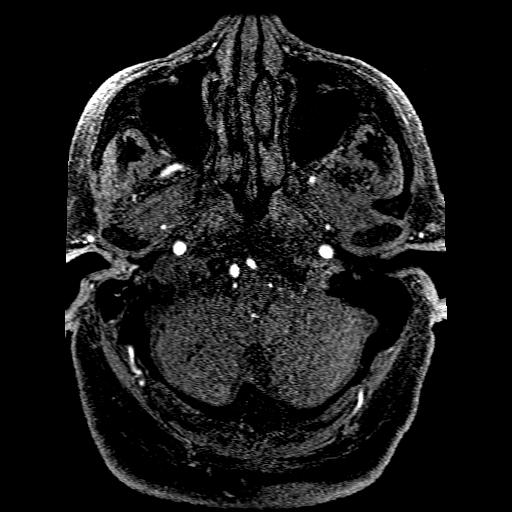
[im 18/197]
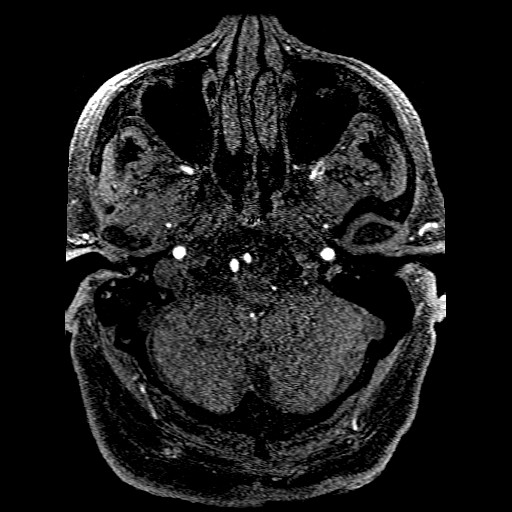
[im 22/197]
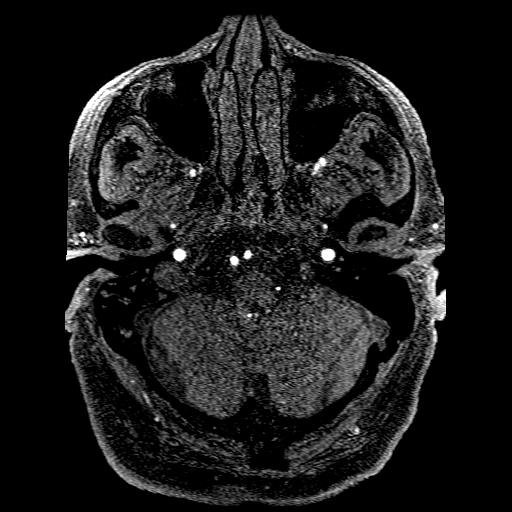
[im 26/197]
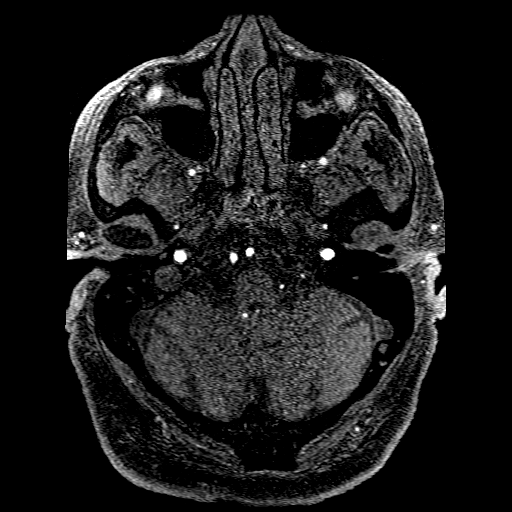
[im 30/197]
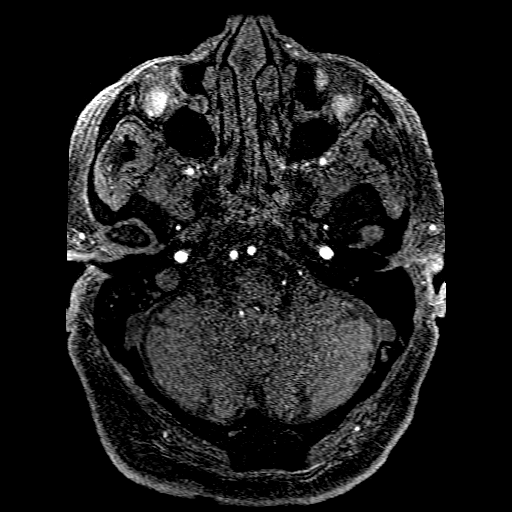
[im 35/197]
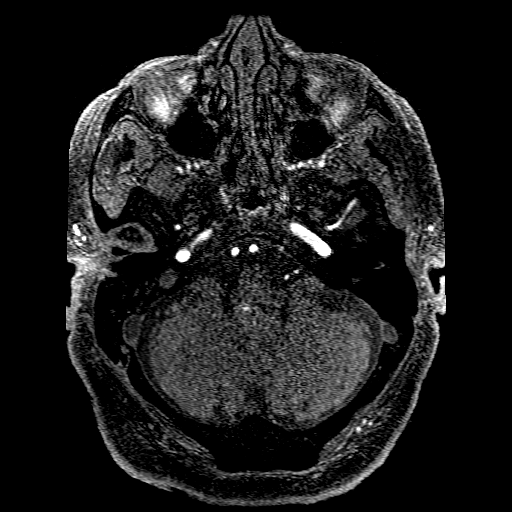
[im 39/197]
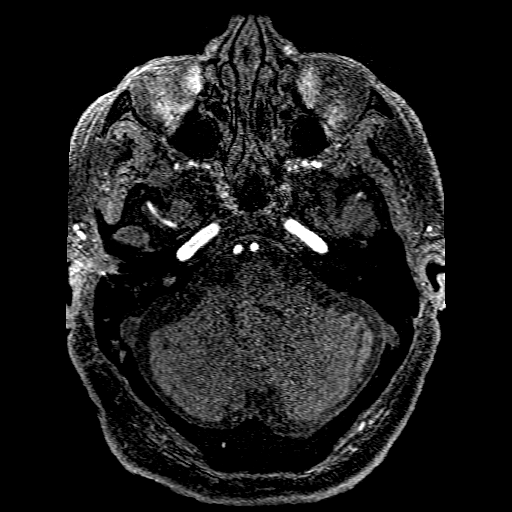
[im 60/197]
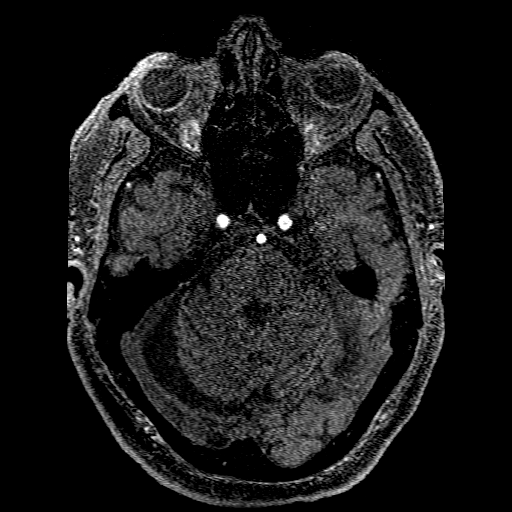
[im 86/197]
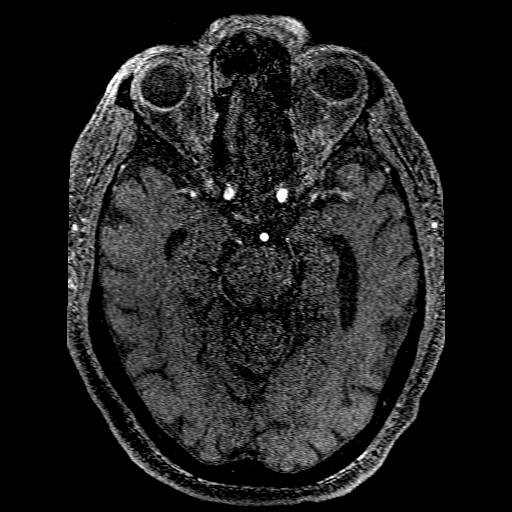
[im 99/197]
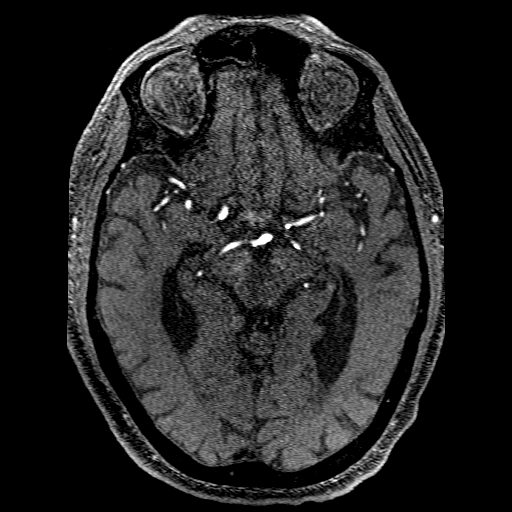
[im 111/197]
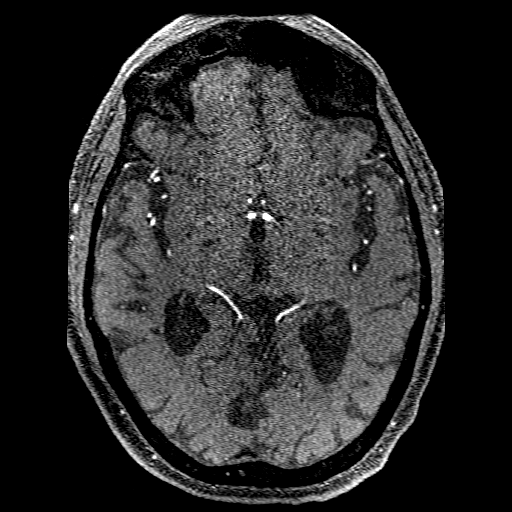
[im 137/197]
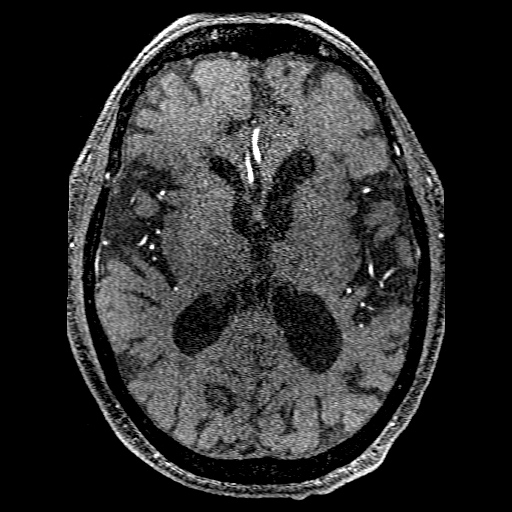
[im 162/197]
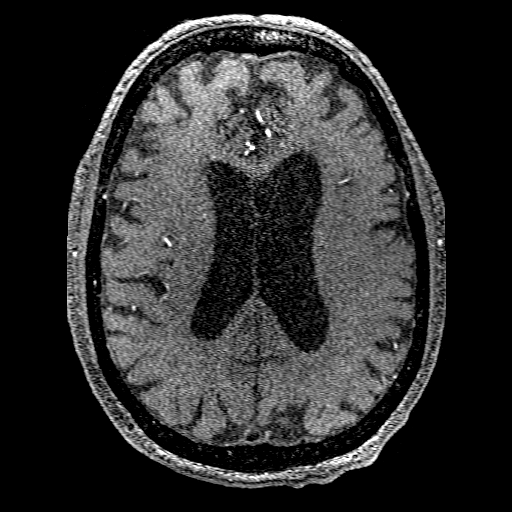
[im 167/197]
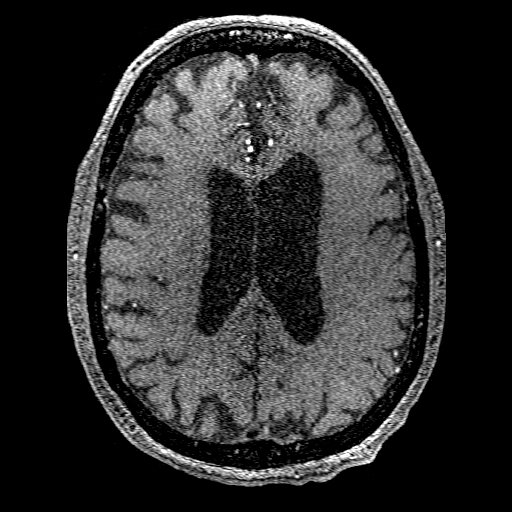
[im 188/197]
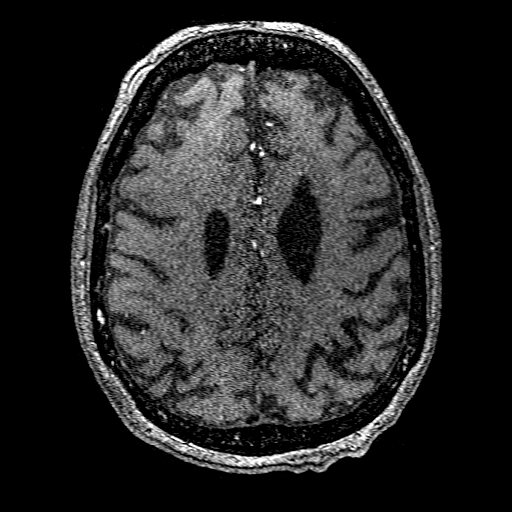

[Series 201: pjn:ax (id) · sagittal · 1.0mm · 0.43mm/px · 1 of 5 slices shown]
[im 1/5]
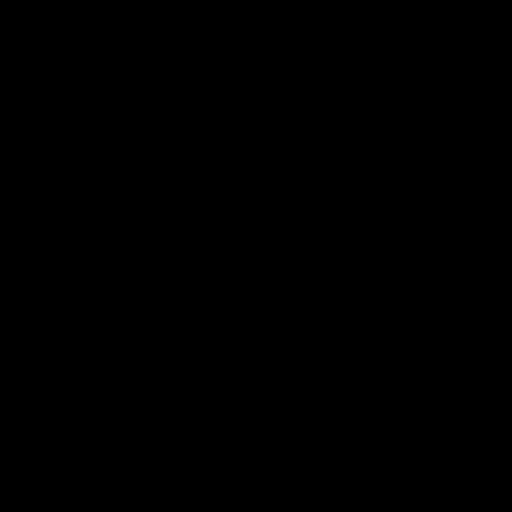

[19 of 48 positions shown; findings below may reference images not displayed]

FINDINGS: No intracranial mass effect or midline shift.

Antegrade flow in the posterior circulation. Fairly codominant
distal vertebral arteries with patent PICA origins and no distal
vertebral stenosis. Patent vertebrobasilar junction and basilar
artery without stenosis. Normal SCA and PCA origins. Posterior
communicating arteries are diminutive or absent. Bilateral PCA
branches are within normal limits.

Antegrade flow in both ICA siphons. Mild siphon irregularity with no
distal ICA stenosis. Patent carotid termini. Normal ophthalmic
artery origins. A small infundibulum of the cavernous right ICA
suspected on series 2, image 73, likely hypophyseal artery related.

Patent MCA and ACA origins. ACA A1 segments, anterior communicating
artery and visible ACA branches are within normal limits. Right MCA
M1 segment and trifurcation are patent without stenosis. Visible
right MCA branches are within normal limits.

Left MCA M1 segment is patent, but there is asymmetric signal loss
at the left MCA trifurcation. However, this is at an area of
relative tortuosity and the M2 branches each appear to remain patent
distally. See series 253, image 3. Also, all M2 branches appear
affected somewhat equally.
IMPRESSION: 1. Artifact rather than MAGO suspected at the Left MCA trifurcation,
with MRI pending at this time.
But if DWI is positive in the Left MCA territory then an LVO can not
be excluded and should be considered. Follow-up CTA Head may be
valuable in that scenario.

2. Otherwise negative for age intracranial MRA.

These results were communicated to Dr. MAGO at [DATE] on
[DATE] by text page via the [employer] messaging system.

## 2020-10-07 MED ORDER — SODIUM CHLORIDE 0.9 % NICU IV INFUSION SIMPLE
1000.0000 mL | INJECTION | Freq: Once | INTRAVENOUS | Status: AC
  Administered 2020-10-07: 1000 mL via INTRAVENOUS
  Filled 2020-10-07: qty 1000

## 2020-10-07 MED ORDER — ASPIRIN EC 81 MG PO TBEC
81.0000 mg | DELAYED_RELEASE_TABLET | Freq: Every day | ORAL | Status: DC
  Administered 2020-10-08: 81 mg via ORAL
  Filled 2020-10-07: qty 1

## 2020-10-07 MED ORDER — SENNA 8.6 MG PO TABS: 1.0000 | ORAL_TABLET | Freq: Every day | ORAL | Status: DC

## 2020-10-07 MED ORDER — INSULIN ASPART 100 UNIT/ML IJ SOLN
0.0000 [IU] | Freq: Three times a day (TID) | INTRAMUSCULAR | Status: DC
  Administered 2020-10-07 – 2020-10-08 (×2): 8 [IU] via SUBCUTANEOUS
  Administered 2020-10-08 (×2): 5 [IU] via SUBCUTANEOUS

## 2020-10-07 MED ORDER — INSULIN GLARGINE-YFGN 100 UNIT/ML ~~LOC~~ SOLN
20.0000 [IU] | Freq: Two times a day (BID) | SUBCUTANEOUS | Status: DC
  Administered 2020-10-07 – 2020-10-08 (×2): 20 [IU] via SUBCUTANEOUS
  Filled 2020-10-07 (×3): qty 0.2

## 2020-10-07 MED ORDER — GABAPENTIN 100 MG PO CAPS
200.0000 mg | ORAL_CAPSULE | Freq: Every day | ORAL | Status: DC
  Administered 2020-10-07: 200 mg via ORAL
  Filled 2020-10-07: qty 2

## 2020-10-07 MED ORDER — ACETAMINOPHEN 160 MG/5ML PO SOLN: 650.0000 mg | ORAL | Status: DC | PRN

## 2020-10-07 MED ORDER — ATORVASTATIN CALCIUM 40 MG PO TABS: 40.0000 mg | ORAL_TABLET | Freq: Every day | ORAL | Status: DC

## 2020-10-07 MED ORDER — STROKE: EARLY STAGES OF RECOVERY BOOK
Freq: Once | Status: AC
  Filled 2020-10-07: qty 1

## 2020-10-07 MED ORDER — ASPIRIN EC 325 MG PO TBEC
650.0000 mg | DELAYED_RELEASE_TABLET | Freq: Once | ORAL | Status: AC
  Administered 2020-10-07: 650 mg via ORAL
  Filled 2020-10-07: qty 2

## 2020-10-07 MED ORDER — LEVOTHYROXINE SODIUM 25 MCG PO TABS
125.0000 ug | ORAL_TABLET | Freq: Every day | ORAL | Status: DC
  Administered 2020-10-08: 125 ug via ORAL
  Filled 2020-10-07: qty 1

## 2020-10-07 MED ORDER — SODIUM CHLORIDE 0.9 % IV SOLN: INTRAVENOUS | Status: DC

## 2020-10-07 MED ORDER — ATORVASTATIN CALCIUM 80 MG PO TABS
80.0000 mg | ORAL_TABLET | Freq: Every day | ORAL | Status: DC
  Administered 2020-10-08: 80 mg via ORAL
  Filled 2020-10-07: qty 1

## 2020-10-07 MED ORDER — ACETAMINOPHEN 325 MG PO TABS: 650.0000 mg | ORAL_TABLET | ORAL | Status: DC | PRN

## 2020-10-07 MED ORDER — TIMOLOL MALEATE 0.5 % OP SOLN
1.0000 [drp] | Freq: Two times a day (BID) | OPHTHALMIC | Status: DC
  Administered 2020-10-07 – 2020-10-08 (×2): 1 [drp] via OPHTHALMIC
  Filled 2020-10-07: qty 5

## 2020-10-07 MED ORDER — ENOXAPARIN SODIUM 30 MG/0.3ML IJ SOSY
30.0000 mg | PREFILLED_SYRINGE | INTRAMUSCULAR | Status: DC
  Administered 2020-10-07 – 2020-10-08 (×2): 30 mg via SUBCUTANEOUS
  Filled 2020-10-07 (×2): qty 0.3

## 2020-10-07 MED ORDER — ACETAMINOPHEN 650 MG RE SUPP: 650.0000 mg | RECTAL | Status: DC | PRN

## 2020-10-07 NOTE — Consult Note (Addendum)
Referring Physician: Dr. Tyrone Nine    Chief Complaint: Acute onset of expressive aphasia with fluctuating course  HPI: Martin Richardson is an 87 y.o. male with a PMHx of atrial fibrillation (not on anticoagulation), left eye blindness, cancer (left kidney), arthritis, cataract, CKD, DM, glaucoma, HTN, neuropathy and prior stroke who presents from home with acute onset of expressive dysphasia (speaking and texting). The patient was LKN at 0800 when he went to church. At 0830 he made a telephone call to family and they noticed that his speech was garbled. When he tried to text them, the texts also were unintelligible. EMS was called and on arrival the patient's speech had normalized. En route to the ED, at about 10:00 AM, he had recurrence of expressive aphasia, during which EMS noted that he "could not get his words out", in conjunction with right facial droop, which both rapidly resolved. No evidence for seizure activity. Vitals in the field: BP 160/70, HR 60, 97% on RA, CBG 363.   The patient is on ASA at home.   LSN: 0800 IV thrombolysis given: No: Symptoms resolved.  NIHSS: 0  Past Medical History:  Diagnosis Date   Arthritis    Blind left eye    Cancer of left kidney ([place])    Cataract    Chronic kidney disease    Constipation    Diabetes mellitus without complication (HCC)    Glaucoma    Hypertension    Neuropathy    Stroke [facility])     Past Surgical History:  Procedure Laterality Date   CHOLECYSTECTOMY     LEFT HEART CATH AND CORONARY ANGIOGRAPHY N/A 12/16/2019   Procedure: LEFT HEART CATH AND CORONARY ANGIOGRAPHY;  Surgeon: Martinique, Peter M, MD;  Location: [facility];  Service: Cardiovascular;  Laterality: N/A;   ORIF ANKLE FRACTURE Right 08/13/2019   Procedure: OPEN REDUCTION INTERNAL FIXATION (ORIF) ANKLE FRACTURE;  Surgeon: Renette Butters, MD;  Location: [facility];  Service: Orthopedics;  Laterality: Right;   THYROID SURGERY     TONSILLECTOMY      Family History   Problem Relation Age of Onset   Diabetes Mellitus II Mother    Social History:  reports that he has quit smoking. He has quit using smokeless tobacco. He reports that he does not drink alcohol and does not use drugs.  Allergies:  Allergies  Allergen Reactions   Other Other (See Comments)    Other reaction(s): rash   Adhesive [Tape] Rash    Medications:  No current facility-administered medications on file prior to encounter.   Current Outpatient Medications on File Prior to Encounter  Medication Sig Dispense Refill   acetaminophen (TYLENOL) 325 MG tablet Take 650 mg by mouth every 4 (four) hours as needed for mild pain or headache.     amLODipine (NORVASC) 5 MG tablet Take 1 tablet (5 mg total) by mouth daily. 90 tablet 3   aspirin 81 MG chewable tablet Chew 1 tablet (81 mg total) by mouth daily. 30 tablet 1   atorvastatin (LIPITOR) 10 MG tablet Take 1 tablet by mouth daily.     betamethasone valerate (VALISONE) 0.1 % cream Apply 1 application topically every Monday, Wednesday, and Friday. For psoriasis -  HOLD     Calcium Polycarbophil (FIBER) 625 MG TABS Take 2 tablets by mouth as needed.     cholecalciferol (VITAMIN D3) 25 MCG (1000 UNIT) tablet 1 tablet     docusate sodium (COLACE) 100 MG capsule Take 100 mg by mouth  as needed.     famotidine (PEPCID) 20 MG tablet Take 20 mg by mouth at bedtime.     gabapentin (NEURONTIN) 100 MG capsule Take 2 capsules (200 mg total) by mouth at bedtime. 60 capsule 0   glimepiride (AMARYL) 4 MG tablet Take 4 mg by mouth in the morning and at bedtime.      insulin glargine (LANTUS) 100 UNIT/ML injection Inject 0.2 mLs (20 Units total) into the skin 2 (two) times daily. 10 mL 11   Insulin Pen Needle 29G X 12MM MISC Use as directed 200 each 0   levothyroxine (SYNTHROID) 125 MCG tablet Take 125 mcg by mouth daily before breakfast.     losartan (COZAAR) 25 MG tablet Take 25 mg by mouth daily.     ONETOUCH ULTRA test strip 1 each 2 (two) times daily.      polyethylene glycol (MIRALAX / GLYCOLAX) 17 g packet Take 17 g by mouth daily as needed.     senna (SENOKOT) 8.6 MG TABS tablet Take 1 tablet (8.6 mg total) by mouth daily. 120 tablet 0   tamsulosin (FLOMAX) 0.4 MG CAPS capsule Take 0.4 mg by mouth daily.     terbinafine (LAMISIL) 250 MG tablet Take by mouth every Monday, Wednesday, and Friday.     timolol (TIMOPTIC) 0.5 % ophthalmic solution SMARTSIG:In Eye(s)     triamcinolone ointment (KENALOG) 0.1 % Apply topically.     UNABLE TO FIND 1 tablet       ROS: Denies any weakness or numbness. No difficulty ambulating. Endorses chronic dizziness with changes of position. Endorses chronic left eye near-blindness. Does not endorse any fever, cough, CP or difficulty breathing. Other ROS as per HPI.   Physical Examination: Weight 104.6 kg.  HEENT: [place]/AT Lungs: Respirations unlabored Ext: Noncyanotic. No pallor.   Neurologic Examination: Mental Status: Alert, fully oriented, thought content appropriate.  Speech fluent with intact comprehension and naming. First attempt at repetition with mild error, second attempt with a more difficult phrase was normal. He did have an approximately 10 second period of hesitant speech that then normalized.  Cranial Nerves: II:  Visual fields intact in right eye with no extinction to DSS. Right pupil 2 mm and reactive. Left pupil 1 mm and nonreactive. Visual acuity right eye intact. Left eye visual acuity at baseline of severe visual loss; able to see light and dark only.  III,IV, VI: No ptosis. Mild exotropia. EOMI. No nystagmus.   V,VII: Smile symmetric, facial temp sensation equal bilaterally VIII: hearing intact to voice IX,X: No hypophonia XI: Symmetric XII: midline tongue extension  Motor: Right : Upper extremity   5/5    Left:     Upper extremity   5/5  Lower extremity   5/5     Lower extremity   5/5 No pronator drift.  Sensory: FT and temp intact with equivocal patchy sensory deficit to RUE. No  extinction to DSS.  Deep Tendon Reflexes:  1+ bilateral brachioradialis and biceps. Unable to elicit patellar or achilles reflexes. Toes downgoing bilaterally.  Cerebellar: No ataxia with FNF bilaterally  Gait: Unsteady when sitting from lying position initially, which patient states is due to he chronic vertigo. Unsteadiness then resolves. Deferred ambulation due to falls risk concerns.    Results for orders placed or performed during the hospital encounter of 10/07/20 (from the past 48 hour(s))  CBC     Status: Abnormal   Collection Time: 10/07/20 10:03 AM  Result Value Ref Range   WBC 6.8  4.0 - 10.5 K/uL   RBC 4.28 4.22 - 5.81 MIL/uL   Hemoglobin 12.5 (L) 13.0 - 17.0 g/dL   HCT 36.5 (L) 39.0 - 52.0 %   MCV 85.3 80.0 - 100.0 fL   MCH 29.2 26.0 - 34.0 pg   MCHC 34.2 30.0 - 36.0 g/dL   RDW 12.7 11.5 - 15.5 %   Platelets 185 150 - 400 K/uL   nRBC 0.0 0.0 - 0.2 %    Comment: Performed at [facility], [place] [address] [place], [place] [address]  Differential     Status: Abnormal   Collection Time: 10/07/20 10:03 AM  Result Value Ref Range   Neutrophils Relative % 61 %   Neutro Abs 4.2 1.7 - 7.7 K/uL   Lymphocytes Relative 17 %   Lymphs Abs 1.2 0.7 - 4.0 K/uL   Monocytes Relative 12 %   Monocytes Absolute 0.8 0.1 - 1.0 K/uL   Eosinophils Relative 8 %   Eosinophils Absolute 0.6 (H) 0.0 - 0.5 K/uL   Basophils Relative 1 %   Basophils Absolute 0.1 0.0 - 0.1 K/uL   Immature Granulocytes 1 %   Abs Immature Granulocytes 0.05 0.00 - 0.07 K/uL    Comment: Performed at [facility] [address] [place], [place] [address]  CBG monitoring, ED     Status: Abnormal   Collection Time: 10/07/20 10:10 AM  Result Value Ref Range   Glucose-Capillary 279 (H) 70 - 99 mg/dL    Comment: Glucose reference range applies only to samples taken after fasting for at least 8 hours.  I-stat chem 8, ED     Status: Abnormal   Collection Time: 10/07/20 10:17 AM  Result Value Ref Range    Sodium 137 135 - 145 mmol/L   Potassium 4.5 3.5 - 5.1 mmol/L   Chloride 106 98 - 111 mmol/L   BUN 36 (H) 8 - 23 mg/dL   Creatinine, Ser 2.10 (H) 0.61 - 1.24 mg/dL   Glucose, Bld 286 (H) 70 - 99 mg/dL    Comment: Glucose reference range applies only to samples taken after fasting for at least 8 hours.   Calcium, Ion 1.34 1.15 - 1.40 mmol/L   TCO2 19 (L) 22 - 32 mmol/L   Hemoglobin 12.2 (L) 13.0 - 17.0 g/dL   HCT 36.0 (L) 39.0 - 52.0 %   CT HEAD CODE STROKE WO CONTRAST  Result Date: 10/07/2020 CLINICAL DATA:  Code stroke.  87 year old male with aphasia. EXAM: CT HEAD WITHOUT CONTRAST TECHNIQUE: Contiguous axial images were obtained from the base of the skull through the vertex without intravenous contrast. COMPARISON:  None. FINDINGS: Brain: No midline shift, mass effect, or evidence of intracranial mass lesion. No ventriculomegaly. No acute intracranial hemorrhage identified. Chronic appearing small linear infarct in the right cerebellum PICA territory. Patchy bilateral white matter hypodensity is relatively symmetric. No cortically based acute infarct identified. No cortical encephalomalacia identified. Vascular: Mild Calcified atherosclerosis at the skull base. No suspicious intracranial vascular hyperdensity. Skull: No acute osseous abnormality identified. Sinuses/Orbits: Mild bilateral paranasal sinus mucosal thickening. Mild right mastoid effusion. Other: Small volume retained secretions in the nasopharynx. Postoperative changes to both globes. No acute orbit or scalp soft tissue finding. There is a small right forehead subcutaneous lipoma on series 3, image 58 (benign). ASPECTS [facility] Stroke Program Early CT Score) Total score (0-10 with 10 being normal): 10 IMPRESSION: 1. No acute cortically based infarct or acute intracranial hemorrhage identified. ASPECTS 10. 2. Small  chronic appearing Right PICA infarct. Mild for age cerebral white matter changes. 3. These results were communicated to Dr.  Cheral Marker at 10:20 am on 10/07/2020 by text page via the [facility] messaging system. Electronically Signed   By: Genevie Ann M.D.   On: 10/07/2020 10:20    Assessment: 87 y.o. male with several stroke risk factors including atrial fibrillation and prior stroke, who presents from home with acute onset of expressive dysphasia. Symptoms first noted by family at 58 and had resolved at EMS arrival. He had recurrence of symptoms en route at about 1000, which rapidly resolved.    1. Exam reveals no focal deficit, except for an approximately 10 second period of hesitant speech that then normalized. Possible left MCA territory transient hypoperfusion as underlying etiology for his TIA symptoms.  2. CT head: No acute cortically based infarct or acute intracranial hemorrhage identified. ASPECTS 10. Small chronic appearing Right PICA infarct. Mild for age cerebral white matter changes 3. Not a tPA candidate due to resolved symptoms 4. Elevated Cr. Will obtain MRA head and carotiid ultrasound instead of CTA.  5. Stroke Risk Factors - Atrial fibrillation (not on anticoagulation), cancer history, CKD, DM, HTN and prior stroke  Recommendations: 1. HgbA1c, fasting lipid panel 2. MRI, MRA  of the brain without contrast 3. Carotid ultrasound 4. TTE 5. PT consult, OT consult, Speech consult 6. Cardiac telemetry 7. Risk factor modification 8. Frequent neuro checks 9. Bolus 1000 mL NS then IV infusion at 75 mL/hr.  10. ASA 650 mg x 1 now then continue with 81 mg po qd. Will need to assess for possible transition to anticoagulation if there are no contraindications including stable gait without significant falls risk.  11. Modified permissive HTN protocol given advanced age: For the first 24 hours, treat SBP if > 180, then normalize by 15% per day to SBP goal of 120-140.   Addendum: - MRA head: Artifact rather than ELVO suspected at the Left MCA trifurcation, with MRI pending at this time. But if DWI is positive in the Left  MCA territory then an LVO can not be excluded and should be considered. Follow-up CTA Head may be valuable in that scenario. - MRI brain: Small acute infarcts in the right precentral gyrus and the anterior right frontal white matter. No significant edema or mass effect. Remote infarcts in the right inferior cerebellum and right temporal lobe. Mild for age chronic microvascular ischemic disease and atrophy.   @Electronically  signed: Dr. Kerney Elbe  10/07/2020, 10:38 AM

## 2020-10-07 NOTE — ED Provider Notes (Signed)
[facility] EMERGENCY DEPARTMENT Provider Note   CSN: [id number] Arrival date & time: 10/07/20  8546  An emergency department physician performed an initial assessment on this suspected stroke patient at 0956.  History Chief Complaint  Patient presents with   Code Stroke    Martin Richardson is a 87 y.o. male.  87 yo M with a chief complaints of slurred speech and aphasia.  This was noted by the family when he tried to talk to them on the phone at about 830 this morning.  He was unsure of any deficit before that.  He woke up this morning and had gone to church but realized it was not open and came back home before making the call.  With EMS he got significantly worse 5 minutes prior to arrival.  He denies one-sided weakness denies difficulty with swallowing.  Denies headache neck pain or trauma.  The history is provided by the patient.  Illness Severity:  Moderate Onset quality:  Gradual Duration:  2 hours Timing:  Constant Progression:  Unchanged Chronicity:  New Associated symptoms: no abdominal pain, no chest pain, no congestion, no diarrhea, no fever, no headaches, no myalgias, no rash, no shortness of breath and no vomiting       Past Medical History:  Diagnosis Date   Arthritis    Blind left eye    Cancer of left kidney ([place])    Cataract    Chronic kidney disease    Constipation    Diabetes mellitus without complication ([place])    Glaucoma    Hypertension    Neuropathy    Stroke [facility])     Patient Active Problem List   Diagnosis Date Noted   Aphasia 10/07/2020   Unstable angina ([place])    Chest pain 12/15/2019   Stage 3b chronic kidney disease ([place])    DM neuropathy, 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   Ankle fracture 08/12/2019   CKD (chronic kidney disease), stage IV ([place]) 08/12/2019   DM (diabetes mellitus), type 2 with renal complications ([place]) 27/03/5007   History of stroke 08/12/2019   Essential hypertension 08/12/2019   Closed right ankle  fracture 08/12/2019   Chronic angle-closure glaucoma, bilateral 01/02/2012   Nuclear sclerotic cataract of left eye 01/02/2012   Amblyopia 07/29/2011   Cataract 07/29/2011   POAG (primary open-angle glaucoma) 07/29/2011    Past Surgical History:  Procedure Laterality Date   CHOLECYSTECTOMY     LEFT HEART CATH AND CORONARY ANGIOGRAPHY N/A 12/16/2019   Procedure: LEFT HEART CATH AND CORONARY ANGIOGRAPHY;  Surgeon: Martinique, Peter M, MD;  Location: [place] CV LAB;  Service: Cardiovascular;  Laterality: N/A;   ORIF ANKLE FRACTURE Right 08/13/2019   Procedure: OPEN REDUCTION INTERNAL FIXATION (ORIF) ANKLE FRACTURE;  Surgeon: Renette Butters, MD;  Location: [facility];  Service: Orthopedics;  Laterality: Right;   THYROID SURGERY     TONSILLECTOMY         Family History  Problem Relation Age of Onset   Diabetes Mellitus II Mother     Social History   Tobacco Use   Smoking status: Former    Types: Pipe    Quit date: 1983    Years since quitting: 39.7   Smokeless tobacco: Former  Substance Use Topics   Alcohol use: Yes    Comment: social   Drug use: Never    Home Medications Prior to Admission medications   Medication Sig Start Date End Date Taking? Authorizing Provider  acetaminophen (TYLENOL) 325 MG tablet  Take 650 mg by mouth every 4 (four) hours as needed for mild pain or headache.    [provider]  amLODipine (NORVASC) 5 MG tablet Take 1 tablet (5 mg total) by mouth daily. 03/06/20   Belva Crome, MD  aspirin 81 MG chewable tablet Chew 1 tablet (81 mg total) by mouth daily. 12/17/19   Shawna Clamp, MD  atorvastatin (LIPITOR) 10 MG tablet Take 1 tablet by mouth daily. 02/09/20   [provider]  betamethasone valerate (VALISONE) 0.1 % cream Apply 1 application topically every Monday, Wednesday, and Friday. For psoriasis -  HOLD    [provider]  Calcium Polycarbophil (FIBER) 625 MG TABS Take 2 tablets by mouth as needed. 06/07/20   [provider]  cholecalciferol (VITAMIN D3) 25 MCG (1000 UNIT) tablet 1 tablet    [provider]  docusate sodium (COLACE) 100 MG capsule Take 100 mg by mouth as needed.    [provider]  famotidine (PEPCID) 20 MG tablet Take 20 mg by mouth at bedtime.    [provider]  gabapentin (NEURONTIN) 100 MG capsule Take 2 capsules (200 mg total) by mouth at bedtime. 08/22/19   Allie Bossier, MD  glimepiride (AMARYL) 4 MG tablet Take 4 mg by mouth in the morning and at bedtime.     [provider]  insulin glargine (LANTUS) 100 UNIT/ML injection Inject 0.2 mLs (20 Units total) into the skin 2 (two) times daily. 08/16/19   Regalado, Jerald Kief A, MD  Insulin Pen Needle 29G X 12MM MISC Use as directed 08/22/19   Allie Bossier, MD  levothyroxine (SYNTHROID) 125 MCG tablet Take 125 mcg by mouth daily before breakfast.    [provider]  losartan (COZAAR) 25 MG tablet Take 25 mg by mouth daily. 01/11/20   [provider]  [facility] ULTRA test strip 1 each 2 (two) times daily. 01/09/20   [provider]  polyethylene glycol (MIRALAX / GLYCOLAX) 17 g packet Take 17 g by mouth daily as needed.    [provider]  senna (SENOKOT) 8.6 MG TABS tablet Take 1 tablet (8.6 mg total) by mouth daily. 08/17/19   Regalado, Belkys A, MD  tamsulosin (FLOMAX) 0.4 MG CAPS capsule Take 0.4 mg by mouth daily. 12/14/19   [provider]  terbinafine (LAMISIL) 250 MG tablet Take by mouth every Monday, Wednesday, and Friday. 01/10/20   [provider]  timolol (TIMOPTIC) 0.5 % ophthalmic solution SMARTSIG:In Eye(s) 02/12/20   [provider]  triamcinolone ointment (KENALOG) 0.1 % Apply topically. 03/01/20   [provider]  UNABLE TO FIND 1 tablet    [provider]    Allergies    Other and Adhesive [tape]  Review of Systems   Review of Systems  Constitutional:  Negative for chills and fever.  HENT:  Negative  for congestion and facial swelling.   Eyes:  Negative for discharge and visual disturbance.  Respiratory:  Negative for shortness of breath.   Cardiovascular:  Negative for chest pain and palpitations.  Gastrointestinal:  Negative for abdominal pain, diarrhea and vomiting.  Musculoskeletal:  Negative for arthralgias and myalgias.  Skin:  Negative for color change and rash.  Neurological:  Positive for speech difficulty. Negative for tremors, syncope and headaches.  Psychiatric/Behavioral:  Negative for confusion and dysphoric mood.    Physical Exam Updated Vital Signs BP (!) 143/67   Pulse (!) 53   Temp 97.7 F (36.5 C) (Oral)  Resp 19   Ht 5\' 11"  (1.803 m)   Wt 104.6 kg   SpO2 96%   BMI 32.16 kg/m   Physical Exam Vitals and nursing note reviewed.  Constitutional:      Appearance: He is well-developed.  HENT:     Head: Normocephalic and atraumatic.  Eyes:     Pupils: Pupils are equal, round, and reactive to light.  Neck:     Vascular: No JVD.  Cardiovascular:     Rate and Rhythm: Normal rate and regular rhythm.     Heart sounds: No murmur heard.   No friction rub. No gallop.  Pulmonary:     Effort: No respiratory distress.     Breath sounds: No wheezing.  Abdominal:     General: There is no distension.     Tenderness: There is no abdominal tenderness. There is no guarding or rebound.  Musculoskeletal:        General: Normal range of motion.     Cervical back: Normal range of motion and neck supple.  Skin:    Coloration: Skin is not pale.     Findings: No rash.  Neurological:     Mental Status: He is alert and oriented to person, place, and time.     Comments: Some garbled speech, right-sided facial droop.  No obvious weakness with upper or lower extremities.  Psychiatric:        Behavior: Behavior normal.    ED Results / Procedures / Treatments   Labs (all labs ordered are listed, but only abnormal results are displayed) Labs Reviewed  CBC - Abnormal;  Notable for the following components:      Result Value   Hemoglobin 12.5 (*)    HCT 36.5 (*)    All other components within normal limits  DIFFERENTIAL - Abnormal; Notable for the following components:   Eosinophils Absolute 0.6 (*)    All other components within normal limits  COMPREHENSIVE METABOLIC PANEL - Abnormal; Notable for the following components:   Sodium 134 (*)    CO2 20 (*)    Glucose, Bld 295 (*)    BUN 37 (*)    Creatinine, Ser 2.08 (*)    Total Protein 6.1 (*)    Albumin 3.3 (*)    AST 13 (*)    GFR, Estimated 29 (*)    All other components within normal limits  I-STAT CHEM 8, ED - Abnormal; Notable for the following components:   BUN 36 (*)    Creatinine, Ser 2.10 (*)    Glucose, Bld 286 (*)    TCO2 19 (*)    Hemoglobin 12.2 (*)    HCT 36.0 (*)    All other components within normal limits  CBG MONITORING, ED - Abnormal; Notable for the following components:   Glucose-Capillary 279 (*)    All other components within normal limits  RESP PANEL BY RT-PCR (FLU A&B, COVID) ARPGX2  ETHANOL  PROTIME-INR  APTT    EKG EKG Interpretation  Date/Time:  Sunday October 07 2020 10:39:53 EDT Ventricular Rate:  59 PR Interval:  402 QRS Duration: 105 QT Interval:  396 QTC Calculation: 393 R Axis:   -27 Text Interpretation: Sinus rhythm Prolonged PR interval Borderline left axis deviation Abnormal R-wave progression, late transition No significant change since last tracing Confirmed by Deno Etienne [phone]) on 10/07/2020 11:05:47 AM  Radiology MR ANGIO HEAD WO CONTRAST  Result Date: 10/07/2020 CLINICAL DATA:  87 year old male code stroke presentation, aphasia. EXAM: MRA HEAD WITHOUT  CONTRAST TECHNIQUE: Angiographic images of the Circle of Willis were acquired using MRA technique without intravenous contrast. COMPARISON:  Plain head CT today at 1013 hours. MRI brain is pending at this time. FINDINGS: No intracranial mass effect or midline shift. Antegrade flow in the  posterior circulation. Fairly codominant distal vertebral arteries with patent PICA origins and no distal vertebral stenosis. Patent vertebrobasilar junction and basilar artery without stenosis. Normal SCA and PCA origins. Posterior communicating arteries are diminutive or absent. Bilateral PCA branches are within normal limits. Antegrade flow in both ICA siphons. Mild siphon irregularity with no distal ICA stenosis. Patent carotid termini. Normal ophthalmic artery origins. A small infundibulum of the cavernous right ICA suspected on series 2, image 73, likely hypophyseal artery related. Patent MCA and ACA origins. ACA A1 segments, anterior communicating artery and visible ACA branches are within normal limits. Right MCA M1 segment and trifurcation are patent without stenosis. Visible right MCA branches are within normal limits. Left MCA M1 segment is patent, but there is asymmetric signal loss at the left MCA trifurcation. However, this is at an area of relative tortuosity and the M2 branches each appear to remain patent distally. See series 253, image 3. Also, all M2 branches appear affected somewhat equally. IMPRESSION: 1. Artifact rather than ELVO suspected at the Left MCA trifurcation, with MRI pending at this time. But if DWI is positive in the Left MCA territory then an LVO can not be excluded and should be considered. Follow-up CTA Head may be valuable in that scenario. 2. Otherwise negative for age intracranial MRA. These results were communicated to Dr. Cheral Marker at 11:49 am on 10/07/2020 by text page via the [facility] messaging system. Electronically Signed   By: Genevie Ann M.D.   On: 10/07/2020 11:50   MR BRAIN WO CONTRAST  Result Date: 10/07/2020 CLINICAL DATA:  Neuro deficit, acute, stroke suspected EXAM: MRI HEAD WITHOUT CONTRAST TECHNIQUE: Multiplanar, multiecho pulse sequences of the brain and surrounding structures were obtained without intravenous contrast. COMPARISON:  Same day CT head. FINDINGS:  Brain: Two small acute infarcts in the right precentral gyrus (series 2, image 42) and the anterior right frontal white matter (series 2, image 30). No significant edema or mass effect. Remote infarcts in the right inferior cerebellum and right temporal lobe white matter. Additional mild for age scattered T2 hyperintensities within the white matter, nonspecific but compatible with chronic microvascular ischemic disease. Mild for age atrophy with ex vacuo ventricular dilation. No hydrocephalus. No acute hemorrhage, mass lesion, midline shift, or extra-axial fluid collection. Small focus of susceptibility artifact within the right frontal white matter, suggestive of prior microhemorrhage. Mild/faint susceptibility artifact in the inferior right cerebellum in the region of encephalomalacia, likely prior petechial hemorrhage. Vascular: Further evaluated on same day MRA. Skull and upper cervical spine: Normal marrow signal. Sinuses/Orbits: Mild paranasal sinus mucosal thickening. No acute orbital findings. Other: Small right mastoid effusion. IMPRESSION: 1. Small acute infarcts in the right precentral gyrus and the anterior right frontal white matter. No significant edema or mass effect. 2. Remote infarcts in the right inferior cerebellum and right temporal lobe. 3. Mild for age chronic microvascular ischemic disease and atrophy. Electronically Signed   By: Margaretha Sheffield M.D.   On: 10/07/2020 13:13   CT HEAD CODE STROKE WO CONTRAST  Result Date: 10/07/2020 CLINICAL DATA:  Code stroke.  87 year old male with aphasia. EXAM: CT HEAD WITHOUT CONTRAST TECHNIQUE: Contiguous axial images were obtained from the base of the skull through the vertex without intravenous  contrast. COMPARISON:  None. FINDINGS: Brain: No midline shift, mass effect, or evidence of intracranial mass lesion. No ventriculomegaly. No acute intracranial hemorrhage identified. Chronic appearing small linear infarct in the right cerebellum PICA  territory. Patchy bilateral white matter hypodensity is relatively symmetric. No cortically based acute infarct identified. No cortical encephalomalacia identified. Vascular: Mild Calcified atherosclerosis at the skull base. No suspicious intracranial vascular hyperdensity. Skull: No acute osseous abnormality identified. Sinuses/Orbits: Mild bilateral paranasal sinus mucosal thickening. Mild right mastoid effusion. Other: Small volume retained secretions in the nasopharynx. Postoperative changes to both globes. No acute orbit or scalp soft tissue finding. There is a small right forehead subcutaneous lipoma on series 3, image 58 (benign). ASPECTS [facility] Stroke Program Early CT Score) Total score (0-10 with 10 being normal): 10 IMPRESSION: 1. No acute cortically based infarct or acute intracranial hemorrhage identified. ASPECTS 10. 2. Small chronic appearing Right PICA infarct. Mild for age cerebral white matter changes. 3. These results were communicated to Dr. Cheral Marker at 10:20 am on 10/07/2020 by text page via the [facility] messaging system. Electronically Signed   By: Genevie Ann M.D.   On: 10/07/2020 10:20    Procedures Procedures   Medications Ordered in ED Medications  aspirin EC tablet 81 mg (has no administration in time range)  atorvastatin (LIPITOR) tablet 40 mg (has no administration in time range)  insulin glargine-yfgn (SEMGLEE) injection 20 Units (has no administration in time range)  levothyroxine (SYNTHROID) tablet 125 mcg (has no administration in time range)  senna (SENOKOT) tablet 8.6 mg (has no administration in time range)  gabapentin (NEURONTIN) capsule 200 mg (has no administration in time range)  enoxaparin (LOVENOX) injection 30 mg (has no administration in time range)  0.9 %  sodium chloride infusion (has no administration in time range)  acetaminophen (TYLENOL) tablet 650 mg (has no administration in time range)    Or  acetaminophen (TYLENOL) 160 MG/5ML solution 650 mg (has no  administration in time range)    Or  acetaminophen (TYLENOL) suppository 650 mg (has no administration in time range)  insulin aspart (novoLOG) injection 0-15 Units (has no administration in time range)  sodium chloride 0.9 % 1,000 mL IV infusion (1,000 mLs Intravenous New Bag/Given 10/07/20 1054)  aspirin EC tablet 650 mg (650 mg Oral Given 10/07/20 1313)   stroke: mapping our early stages of recovery book ( Does not apply Given 10/07/20 1314)    ED Course  I have reviewed the triage vital signs and the nursing notes.  Pertinent labs & imaging results that were available during my care of the patient were reviewed by me and considered in my medical decision making (see chart for details).    MDM Rules/Calculators/A&P                           87 yo M with a chief complaint of aphasia and right-sided facial droop.  Started this morning.  Patient is unsure if he had the symptoms when he woke up first noticed when he tried to talk to his family on the phone about 830.  He did have significant worsening about 5 minutes prior to arrival here in the ED with EMS.  Will activate as a code stroke.  Patient seen acutely by neurology.  Thought to likely have a stroke and recommended medical admission.  Patient's renal function is mildly worse than baseline plan for stat MRI/MRA.  Will discuss with medicine.  The patients results and  plan were reviewed and discussed.   Any x-rays performed were independently reviewed by myself.   Differential diagnosis were considered with the presenting HPI.  Medications  aspirin EC tablet 81 mg (has no administration in time range)  atorvastatin (LIPITOR) tablet 40 mg (has no administration in time range)  insulin glargine-yfgn (SEMGLEE) injection 20 Units (has no administration in time range)  levothyroxine (SYNTHROID) tablet 125 mcg (has no administration in time range)  senna (SENOKOT) tablet 8.6 mg (has no administration in time range)  gabapentin  (NEURONTIN) capsule 200 mg (has no administration in time range)  enoxaparin (LOVENOX) injection 30 mg (has no administration in time range)  0.9 %  sodium chloride infusion (has no administration in time range)  acetaminophen (TYLENOL) tablet 650 mg (has no administration in time range)    Or  acetaminophen (TYLENOL) 160 MG/5ML solution 650 mg (has no administration in time range)    Or  acetaminophen (TYLENOL) suppository 650 mg (has no administration in time range)  insulin aspart (novoLOG) injection 0-15 Units (has no administration in time range)  sodium chloride 0.9 % 1,000 mL IV infusion (1,000 mLs Intravenous New Bag/Given 10/07/20 1054)  aspirin EC tablet 650 mg (650 mg Oral Given 10/07/20 1313)   stroke: mapping our early stages of recovery book ( Does not apply Given 10/07/20 1314)    Vitals:   10/07/20 1019 10/07/20 1043 10/07/20 1045 10/07/20 1315  BP:  133/68  (!) 143/67  Pulse:  (!) 58  (!) 53  Resp:  16  19  Temp:  97.7 F (36.5 C)    TempSrc:  Oral    SpO2:  94%  96%  Weight: 104.6 kg  104.6 kg   Height:   5\' 11"  (1.803 m)     Final diagnoses:  Acute ischemic stroke [facility])    Admission/ observation were discussed with the admitting physician, patient and/or family and they are comfortable with the plan.   Final Clinical Impression(s) / ED Diagnoses Final diagnoses:  Acute ischemic stroke [facility])    Rx / DC Orders ED Discharge Orders     None        Deno Etienne, DO 10/07/20 1330

## 2020-10-07 NOTE — ED Notes (Signed)
Pt to MRI

## 2020-10-07 NOTE — ED Triage Notes (Signed)
Pt BIB EMS after family called d/t dysphasia and confusion. Pt family called him around 0830 after he sent several gibberish text messages, according to family he had word finding difficulty which led them to call 911. Pt has hx of stroke. To CT on arrival.

## 2020-10-07 NOTE — TOC Initial Note (Signed)
Transition of Care [facility]) - Initial/Assessment Note    Patient Details  Name: Martin Richardson MRN: [id number] Date of Birth: 04-16-1925  [facility] of [facility]) CM/SW Contact:    Verdell Carmine, RN Phone Number: 10/07/2020, 3:42 PM  Clinical Narrative:                  87 year old lives in apartment has daughter to call on. Developed sroke like symptoms, "not maiking any sense" when went to church today. Workup has included MRI which show small infarcts , and ECHO EF 50-55%. Will likely need home health, PT consult placed for recommendations.   TOC to follow for needs , recommendations, and transitions.     Barriers to Discharge: Continued Medical Work up   Patient Goals and CMS Choice        Expected Discharge Plan and [place] with [place] arrangements for the past 2 months: Apartment                                      Prior Living Arrangements/Services Living arrangements for the past 2 months: [place] with:: Self Patient language and need for interpreter reviewed:: Yes        Need for Family Participation in Patient Care: Yes (Comment) Care giver support system in place?: Yes (comment)   Criminal Activity/Legal Involvement Pertinent to Current Situation/Hospitalization: No - Comment as needed  Activities of Daily Living      Permission Sought/Granted                  Emotional Assessment         Alcohol / Substance Use: Not Applicable Psych Involvement: No (comment)  Admission diagnosis:  Aphasia [R47.01] Patient Active Problem List   Diagnosis Date Noted   Acute CVA (cerebrovascular accident) ([place]) 10/07/2020   Hypothyroidism (acquired) 10/07/2020   Unstable angina ([place])    Chest pain 12/15/2019   Stage 3b chronic kidney disease ([place])    DM neuropathy, 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   Ankle fracture 08/12/2019   CKD (chronic kidney disease), stage IV ([place]) 08/12/2019   DM (diabetes mellitus), type  2 with renal complications ([place]) 62/13/0865   History of stroke 08/12/2019   Essential hypertension 08/12/2019   Closed right ankle fracture 08/12/2019   Chronic angle-closure glaucoma, bilateral 01/02/2012   Nuclear sclerotic cataract of left eye 01/02/2012   Amblyopia 07/29/2011   Cataract 07/29/2011   POAG (primary open-angle glaucoma) 07/29/2011   PCP:  Kristen Loader, FNP Pharmacy:   [place], [place] [address] Phone: [phone] Fax: [phone]  [facility] [address] [place] [address] Phone: [phone] Fax: [phone]  [facility]  ([place], [place] [facility] [address] [place] [address] [place] [address] Phone: [phone] Fax: [phone]     Social Determinants of Health (SDOH) Interventions    Readmission Risk Interventions No flowsheet data found.

## 2020-10-07 NOTE — H&P (Signed)
History and Physical    Martin Richardson LZJ:[id number] DOB: October 23, 1925 DOA: 10/07/2020  PCP: Kristen Loader, FNP Consultants:  [name] - cardiology; [facility] Patient coming from:  Home - lives alone; NOK: Daughter, Marisa Hua, [phone]  Chief Complaint: Aphasia  HPI: Martin Richardson is a 87 y.o. male with medical history significant of RCC s/p nephrectomy with stage 4 CKD; CAD; DM; HTN; and h/o CVA presenting with aphasia.  He went to church this AM and we listening to the 911 radio broadcasts.  He talked to his daughter and wasn't making sense. He went home again because they was no early church session and he noticed increased difficulty talking.  His daughter called 911.  No other neurologic symptoms.      ED Course: Called family and they noted aphasia.  Waxing and waning.  Called code stroke.  No tPA, needs MRI/MRA, stroke evaluation.  Review of Systems: As per HPI; otherwise review of systems reviewed and negative.   Ambulatory Status:  Ambulates with a cane  COVID Vaccine Status: Complete plus booster  Past Medical History:  Diagnosis Date   Arthritis    Blind left eye    Cancer of left kidney ([place])    Cataract    Chronic kidney disease    Constipation    Diabetes mellitus without complication ([place])    Glaucoma    Hypertension    Neuropathy    Stroke [facility] [place])     Past Surgical History:  Procedure Laterality Date   CHOLECYSTECTOMY     LEFT HEART CATH AND CORONARY ANGIOGRAPHY N/A 12/16/2019   Procedure: LEFT HEART CATH AND CORONARY ANGIOGRAPHY;  Surgeon: Martinique, Peter M, MD;  Location: [place] [facility];  Service: Cardiovascular;  Laterality: N/A;   ORIF ANKLE FRACTURE Right 08/13/2019   Procedure: OPEN REDUCTION INTERNAL FIXATION (ORIF) ANKLE FRACTURE;  Surgeon: Renette Butters, MD;  Location: [place];  Service: Orthopedics;  Laterality: Right;   THYROID SURGERY     TONSILLECTOMY      Social History   Socioeconomic History   Marital status:  Widowed    Spouse name: Not on file   Number of children: Not on file   Years of education: Not on file   Highest education level: Not on file  Occupational History   Occupation: retired  Tobacco Use   Smoking status: Former    Types: Pipe    Quit date: 1983    Years since quitting: 39.7   Smokeless tobacco: Former  Substance and Sexual Activity   Alcohol use: Yes    Comment: social   Drug use: Never   Sexual activity: Not on file  Other Topics Concern   Not on file  Social History Narrative   Not on file   Social Determinants of Health   Financial Resource Strain: Not on file  Food Insecurity: Not on file  Transportation Needs: Not on file  Physical Activity: Not on file  Stress: Not on file  Social Connections: Not on file  Intimate Partner Violence: Not on file    Allergies  Allergen Reactions   Other Other (See Comments)    Other reaction(s): rash   Adhesive [Tape] Rash    Family History  Problem Relation Age of Onset   Diabetes Mellitus II Mother     Prior to Admission medications   Medication Sig Start Date End Date Taking? Authorizing Provider  acetaminophen (TYLENOL) 325 MG tablet Take 650 mg by mouth every 4 (four)  hours as needed for mild pain or headache.    [provider]  amLODipine (NORVASC) 5 MG tablet Take 1 tablet (5 mg total) by mouth daily. 03/06/20   Belva Crome, MD  aspirin 81 MG chewable tablet Chew 1 tablet (81 mg total) by mouth daily. 12/17/19   Shawna Clamp, MD  atorvastatin (LIPITOR) 10 MG tablet Take 1 tablet by mouth daily. 02/09/20   [provider]  betamethasone valerate (VALISONE) 0.1 % cream Apply 1 application topically every Monday, Wednesday, and Friday. For psoriasis -  HOLD    [provider]  Calcium Polycarbophil (FIBER) 625 MG TABS Take 2 tablets by mouth as needed. 06/07/20   [provider]  cholecalciferol (VITAMIN D3) 25 MCG (1000 UNIT) tablet 1 tablet    [provider]   docusate sodium (COLACE) 100 MG capsule Take 100 mg by mouth as needed.    [provider]  famotidine (PEPCID) 20 MG tablet Take 20 mg by mouth at bedtime.    [provider]  gabapentin (NEURONTIN) 100 MG capsule Take 2 capsules (200 mg total) by mouth at bedtime. 08/22/19   Allie Bossier, MD  glimepiride (AMARYL) 4 MG tablet Take 4 mg by mouth in the morning and at bedtime.     [provider]  insulin glargine (LANTUS) 100 UNIT/ML injection Inject 0.2 mLs (20 Units total) into the skin 2 (two) times daily. 08/16/19   Regalado, Jerald Kief A, MD  Insulin Pen Needle 29G X 12MM MISC Use as directed 08/22/19   Allie Bossier, MD  levothyroxine (SYNTHROID) 125 MCG tablet Take 125 mcg by mouth daily before breakfast.    [provider]  losartan (COZAAR) 25 MG tablet Take 25 mg by mouth daily. 01/11/20   [provider]  [facility] ULTRA test strip 1 each 2 (two) times daily. 01/09/20   [provider]  polyethylene glycol (MIRALAX / GLYCOLAX) 17 g packet Take 17 g by mouth daily as needed.    [provider]  senna (SENOKOT) 8.6 MG TABS tablet Take 1 tablet (8.6 mg total) by mouth daily. 08/17/19   Regalado, Belkys A, MD  tamsulosin (FLOMAX) 0.4 MG CAPS capsule Take 0.4 mg by mouth daily. 12/14/19   [provider]  terbinafine (LAMISIL) 250 MG tablet Take by mouth every Monday, Wednesday, and Friday. 01/10/20   [provider]  timolol (TIMOPTIC) 0.5 % ophthalmic solution SMARTSIG:In Eye(s) 02/12/20   [provider]  triamcinolone ointment (KENALOG) 0.1 % Apply topically. 03/01/20   [provider]  UNABLE TO FIND 1 tablet    [provider]    Physical Exam: Vitals:   10/07/20 1019 10/07/20 1043 10/07/20 1045 10/07/20 1315  BP:  133/68  (!) 143/67  Pulse:  (!) 58  (!) 53  Resp:  16  19  Temp:  97.7 F (36.5 C)    TempSrc:  Oral    SpO2:  94%  96%  Weight: 104.6 kg  104.6 kg   Height:   5'  11" (1.803 m)      General:  Appears calm and comfortable and is in NAD; appears much younger than stated age Eyes:  PERRL, EOMI, normal lids, iris ENT:  grossly normal hearing, lips & tongue, mmm Neck:  no LAD, masses or thyromegaly; no carotid bruits Cardiovascular:  RRR, no m/r/g. No LE edema.  Respiratory:   CTA bilaterally with no wheezes/rales/rhonchi.  Normal respiratory effort. Abdomen:  soft, NT, ND  Skin:  no rash or induration seen on limited exam Musculoskeletal:  grossly normal tone BUE/BLE, good ROM, no bony abnormality Psychiatric:  grossly normal mood and affect, speech fluent and appropriate with intermittent word finding difficulties and expressive aphasia, AOx3 Neurologic:  CN 2-12 grossly intact, moves all extremities in coordinated fashion, sensation intact    Radiological Exams on Admission: Independently reviewed - see discussion in A/P where applicable  MR ANGIO HEAD WO CONTRAST  Result Date: 10/07/2020 CLINICAL DATA:  87 year old male code stroke presentation, aphasia. EXAM: MRA HEAD WITHOUT CONTRAST TECHNIQUE: Angiographic images of the Circle of Willis were acquired using MRA technique without intravenous contrast. COMPARISON:  Plain head CT today at 1013 hours. MRI brain is pending at this time. FINDINGS: No intracranial mass effect or midline shift. Antegrade flow in the posterior circulation. Fairly codominant distal vertebral arteries with patent PICA origins and no distal vertebral stenosis. Patent vertebrobasilar junction and basilar artery without stenosis. Normal SCA and PCA origins. Posterior communicating arteries are diminutive or absent. Bilateral PCA branches are within normal limits. Antegrade flow in both ICA siphons. Mild siphon irregularity with no distal ICA stenosis. Patent carotid termini. Normal ophthalmic artery origins. A small infundibulum of the cavernous right ICA suspected on series 2, image 73, likely hypophyseal artery related. Patent MCA  and ACA origins. ACA A1 segments, anterior communicating artery and visible ACA branches are within normal limits. Right MCA M1 segment and trifurcation are patent without stenosis. Visible right MCA branches are within normal limits. Left MCA M1 segment is patent, but there is asymmetric signal loss at the left MCA trifurcation. However, this is at an area of relative tortuosity and the M2 branches each appear to remain patent distally. See series 253, image 3. Also, all M2 branches appear affected somewhat equally. IMPRESSION: 1. Artifact rather than ELVO suspected at the Left MCA trifurcation, with MRI pending at this time. But if DWI is positive in the Left MCA territory then an LVO can not be excluded and should be considered. Follow-up CTA Head may be valuable in that scenario. 2. Otherwise negative for age intracranial MRA. These results were communicated to Dr. Cheral Marker at 11:49 am on 10/07/2020 by text page via the [facility] messaging system. Electronically Signed   By: Genevie Ann M.D.   On: 10/07/2020 11:50   MR BRAIN WO CONTRAST  Result Date: 10/07/2020 CLINICAL DATA:  Neuro deficit, acute, stroke suspected EXAM: MRI HEAD WITHOUT CONTRAST TECHNIQUE: Multiplanar, multiecho pulse sequences of the brain and surrounding structures were obtained without intravenous contrast. COMPARISON:  Same day CT head. FINDINGS: Brain: Two small acute infarcts in the right precentral gyrus (series 2, image 42) and the anterior right frontal white matter (series 2, image 30). No significant edema or mass effect. Remote infarcts in the right inferior cerebellum and right temporal lobe white matter. Additional mild for age scattered T2 hyperintensities within the white matter, nonspecific but compatible with chronic microvascular ischemic disease. Mild for age atrophy with ex vacuo ventricular dilation. No hydrocephalus. No acute hemorrhage, mass lesion, midline shift, or extra-axial fluid collection. Small focus of susceptibility  artifact within the right frontal white matter, suggestive of prior microhemorrhage. Mild/faint susceptibility artifact in the inferior right cerebellum in the region of encephalomalacia, likely prior petechial hemorrhage. Vascular: Further evaluated on same day MRA. Skull and upper cervical spine: Normal marrow signal. Sinuses/Orbits: Mild paranasal sinus mucosal thickening. No acute orbital findings. Other: Small right mastoid effusion. IMPRESSION: 1. Small acute infarcts in the right precentral  gyrus and the anterior right frontal white matter. No significant edema or mass effect. 2. Remote infarcts in the right inferior cerebellum and right temporal lobe. 3. Mild for age chronic microvascular ischemic disease and atrophy. Electronically Signed   By: Margaretha Sheffield M.D.   On: 10/07/2020 13:13   CT HEAD CODE STROKE WO CONTRAST  Result Date: 10/07/2020 CLINICAL DATA:  Code stroke.  87 year old male with aphasia. EXAM: CT HEAD WITHOUT CONTRAST TECHNIQUE: Contiguous axial images were obtained from the base of the skull through the vertex without intravenous contrast. COMPARISON:  None. FINDINGS: Brain: No midline shift, mass effect, or evidence of intracranial mass lesion. No ventriculomegaly. No acute intracranial hemorrhage identified. Chronic appearing small linear infarct in the right cerebellum PICA territory. Patchy bilateral white matter hypodensity is relatively symmetric. No cortically based acute infarct identified. No cortical encephalomalacia identified. Vascular: Mild Calcified atherosclerosis at the skull base. No suspicious intracranial vascular hyperdensity. Skull: No acute osseous abnormality identified. Sinuses/Orbits: Mild bilateral paranasal sinus mucosal thickening. Mild right mastoid effusion. Other: Small volume retained secretions in the nasopharynx. Postoperative changes to both globes. No acute orbit or scalp soft tissue finding. There is a small right forehead subcutaneous lipoma on  series 3, image 58 (benign). ASPECTS [facility] Stroke Program Early CT Score) Total score (0-10 with 10 being normal): 10 IMPRESSION: 1. No acute cortically based infarct or acute intracranial hemorrhage identified. ASPECTS 10. 2. Small chronic appearing Right PICA infarct. Mild for age cerebral white matter changes. 3. These results were communicated to Dr. Cheral Marker at 10:20 am on 10/07/2020 by text page via the [facility] messaging system. Electronically Signed   By: Genevie Ann M.D.   On: 10/07/2020 10:20    EKG: Independently reviewed.  NSR with rate 59; NSCSLT   Labs on Admission: I have personally reviewed the available labs and imaging studies at the time of the admission.  Pertinent labs:   Glucose 295 BUN 37/Creatinine 2.08/GFR 29 - stable Unremarkable CBC INR 1.0   Assessment/Plan Principal Problem:   Acute CVA (cerebrovascular accident) ([place]) Active Problems:   CKD (chronic kidney disease), stage IV ([facility])   DM (diabetes mellitus), type 2 with renal complications ([place])   Essential hypertension   Hypothyroidism (acquired)   CVA -Patient with prior h/o CVA 1-2 years ago presenting with onset of aphasia this AM; symptoms have been fluctuating throughout the day -No other neurologic symptoms -MRI confirms CVA -This patient was not deemed to be a candidate for tPA therapy due to mild symptoms -Aspirin has been given to reduce stroke mortality and decrease morbidity -Will place in observation status for further CVA evaluation -Telemetry monitoring -Echo -If the patient does not have known afib and this is not detected on telemetry during hospitalization, consider outpatient Holter monitoring and/or loop recorder placement. -Risk stratification with FLP, A1c -Neurology consult -PT/OT/ST/Nutrition Consults  HTN -Allow permissive HTN for now -Treat BP only if >220/120, and then with goal of 15% reduction -Hold Norvasc and plan to restart in 48-72 hours   HLD -Check FLP -Resume  Lipitor 80 mg daily   DM -Check A1c -Hold home PO medication (amaryl) -Continue Lantus -Will order moderate-scale SSI  CAD -Continue ASA -No current anginal complaints  Stage 4 CKD -Appears to be stable at this time -Attempt to avoid nephrotoxic agents  Hypothyroidism -Continue Synthroid at current dose for now      Note: This patient has been tested and is negative for the novel coronavirus COVID-19. She has been fully vaccinated  against COVID-19.    DVT prophylaxis:  Lovenox  Code Status: Full - confirmed with patient/family Family Communication: Daughter present throughout evaluation Disposition Plan:  The patient is from: home  Anticipated d/c is to: home without [facility] services once her cardiology issues have been resolved.  Anticipated d/c date will depend on clinical response to treatment, but possibly as early as tomorrow if she has excellent response to treatment  Patient is currently: acutely ill Consults called: Neurology; PT/OT/ST/Nutrition; [facility] team Admission status: It is my clinical opinion that referral for OBSERVATION is reasonable and necessary in this patient based on the above information provided. The aforementioned taken together are felt to place the patient at high risk for further clinical deterioration. However it is anticipated that the patient may be medically stable for discharge from the hospital within 24 to 48 hours.      Karmen Bongo MD [facility]   How to contact the [facility] Attending or Consulting provider [place] or covering provider during after hours [place], for this patient?  Check the care team in [facility] and look for a) attending/consulting [facility] provider listed and b) the [facility] team listed Log into [email or web address] and use [place]'s universal password to access. If you do not have the password, please contact the hospital operator. Locate the [facility] provider you are looking for under [facility] and page to a number that you can  be directly reached. If you still have difficulty reaching the provider, please page the [facility] (Director on Call) for the Hospitalists listed on amion for assistance.   10/07/2020, 2:13 PM

## 2020-10-08 ENCOUNTER — Observation Stay (HOSPITAL_BASED_OUTPATIENT_CLINIC_OR_DEPARTMENT_OTHER): Payer: Medicare Other

## 2020-10-08 DIAGNOSIS — R4701 Aphasia: Secondary | ICD-10-CM

## 2020-10-08 DIAGNOSIS — E1121 Type 2 diabetes mellitus with diabetic nephropathy: Secondary | ICD-10-CM

## 2020-10-08 DIAGNOSIS — I639 Cerebral infarction, unspecified: Secondary | ICD-10-CM | POA: Diagnosis not present

## 2020-10-08 LAB — LIPID PANEL
Cholesterol: 69 mg/dL (ref 0–200)
HDL: 22 mg/dL — ABNORMAL LOW (ref >40)
LDL Cholesterol: 13 mg/dL (ref 0–99)
Total CHOL/HDL Ratio: 3.1 RATIO
Triglycerides: 171 mg/dL — ABNORMAL HIGH (ref <150)
VLDL: 34 mg/dL (ref 0–40)

## 2020-10-08 LAB — CBG MONITORING, ED: Glucose-Capillary: 202 mg/dL — ABNORMAL HIGH (ref 70–99)

## 2020-10-08 LAB — GLUCOSE, CAPILLARY
Glucose-Capillary: 231 mg/dL — ABNORMAL HIGH (ref 70–99)
Glucose-Capillary: 253 mg/dL — ABNORMAL HIGH (ref 70–99)

## 2020-10-08 LAB — HEMOGLOBIN A1C
Hgb A1c MFr Bld: 9.3 % — ABNORMAL HIGH (ref 4.8–5.6)
Mean Plasma Glucose: 220.21 mg/dL

## 2020-10-08 MED ORDER — INSULIN GLARGINE 100 UNIT/ML ~~LOC~~ SOLN: SUBCUTANEOUS | 11 refills | Status: DC

## 2020-10-08 MED ORDER — TAMSULOSIN HCL 0.4 MG PO CAPS: 0.4000 mg | ORAL_CAPSULE | Freq: Every day | ORAL | Status: AC

## 2020-10-08 MED ORDER — AMLODIPINE BESYLATE 5 MG PO TABS: 5.0000 mg | ORAL_TABLET | Freq: Every day | ORAL | 3 refills | Status: DC

## 2020-10-08 MED ORDER — ASPIRIN 81 MG PO TBEC: 81.0000 mg | DELAYED_RELEASE_TABLET | Freq: Every day | ORAL | 11 refills | Status: AC

## 2020-10-08 NOTE — Progress Notes (Signed)
Discharge paperwork reviewed with pt and his daughters Jenny Reichmann and [name]. All verbalized understanding. Daughters will transport pt back their home via private vehicle. Pt alert and oriented x 4 in no acute distress upon discharge. Pt taken down to main entrance via wheelchair by NT. Pt has taken all of his belongings with him.

## 2020-10-08 NOTE — Discharge Instructions (Signed)
Follow with Primary MD Kristen Loader, FNP in 7 days   Get CBC, CMP, checked  by Primary MD next visit.    Activity: As tolerated with Full fall precautions use walker/cane & assistance as needed   Disposition Home   Diet: Heart Healthy / carb modified  .  On your next visit with your primary care physician please Get Medicines reviewed and adjusted.   Please request your Prim.MD to go over all Hospital Tests and Procedure/Radiological results at the follow up, please get all Hospital records sent to your Prim MD by signing hospital release before you go home.   If you experience worsening of your admission symptoms, develop shortness of breath, life threatening emergency, suicidal or homicidal thoughts you must seek medical attention immediately by calling 911 or calling your MD immediately  if symptoms less severe.  You Must read complete instructions/literature along with all the possible adverse reactions/side effects for all the Medicines you take and that have been prescribed to you. Take any new Medicines after you have completely understood and accpet all the possible adverse reactions/side effects.   Do not drive, operating heavy machinery, perform activities at heights, swimming or participation in water activities or provide baby sitting services if your were admitted for syncope or siezures until you have seen by Primary MD or a Neurologist and advised to do so again.  Do not drive when taking Pain medications.    Do not take more than prescribed Pain, Sleep and Anxiety Medications  Special Instructions: If you have smoked or chewed Tobacco  in the last 2 yrs please stop smoking, stop any regular Alcohol  and or any Recreational drug use.  Wear Seat belts while driving.   Please note  You were cared for by a hospitalist during your hospital stay. If you have any questions about your discharge medications or the care you received while you were in the hospital after  you are discharged, you can call the unit and asked to speak with the hospitalist on call if the hospitalist that took care of you is not available. Once you are discharged, your primary care physician will handle any further medical issues. Please note that NO REFILLS for any discharge medications will be authorized once you are discharged, as it is imperative that you return to your primary care physician (or establish a relationship with a primary care physician if you do not have one) for your aftercare needs so that they can reassess your need for medications and monitor your lab values.

## 2020-10-08 NOTE — Progress Notes (Addendum)
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 CT head was unremarkable. MRI brain shows small acute stroke at right precentral gyrus and anterior right frontal white matter.  Patient's 2 daughters are in the room and gives history that patient lives in an independent condo but does have some memory difficulties and at times forgets to take his medications.  He ambulates with a cane and walker.  He is still driving.  Vitals:   10/08/20 0721 10/08/20 0730 10/08/20 0815 10/08/20 0917  BP: (!) 156/73 129/65 122/64 (!) 153/72  Pulse: (!) 59 61 60   Resp: 16 13 14    Temp: 97.7 F (36.5 C)   97.7 F (36.5 C)  TempSrc: Oral   Oral  SpO2: 96% 100% 95%   Weight:      Height:       CBC:  Recent Labs  Lab 10/07/20 1003 10/07/20 1017  WBC 6.8  --   NEUTROABS 4.2  --   HGB 12.5* 12.2*  HCT 36.5* 36.0*  MCV 85.3  --   PLT 185  --    Basic Metabolic Panel:  Recent Labs  Lab 10/07/20 1003 10/07/20 1017  NA 134* 137  K 4.5 4.5  CL 107 106  CO2 20*  --   GLUCOSE 295* 286*  BUN 37* 36*  CREATININE 2.08* 2.10*  CALCIUM 9.6  --     Lipid Panel:  Recent Labs  Lab 10/08/20 0431  CHOL 69  TRIG 171*  HDL 22*  CHOLHDL 3.1  VLDL 34  LDLCALC 13    HgbA1c:  Recent Labs  Lab 10/08/20 0431  HGBA1C 9.3*   Urine Drug Screen: No results for input(s): LABOPIA, COCAINSCRNUR, LABBENZ, AMPHETMU, THCU, LABBARB in the last 168 hours.  Alcohol Level  Recent Labs  Lab 10/07/20 1003  ETH <10    IMAGING past 24 hours MR ANGIO HEAD WO CONTRAST  Result Date: 10/07/2020 CLINICAL DATA:  87 year old male code stroke presentation, aphasia. EXAM: MRA HEAD WITHOUT CONTRAST TECHNIQUE: Angiographic images of the Circle of Willis were acquired using MRA technique without intravenous contrast. COMPARISON:  Plain head CT today  at 1013 hours. MRI brain is pending at this time. FINDINGS: No intracranial mass effect or midline shift. Antegrade flow in the posterior circulation. Fairly codominant distal vertebral arteries with patent PICA origins and no distal vertebral stenosis. Patent vertebrobasilar junction and basilar artery without stenosis. Normal SCA and PCA origins. Posterior communicating arteries are diminutive or absent. Bilateral PCA branches are within normal limits. Antegrade flow in both ICA siphons. Mild siphon irregularity with no distal ICA stenosis. Patent carotid termini. Normal ophthalmic artery origins. A small infundibulum of the cavernous right ICA suspected on series 2, image 73, likely hypophyseal artery related. Patent MCA and ACA origins. ACA A1 segments, anterior communicating artery and visible ACA branches are within normal limits. Right MCA M1 segment and trifurcation are patent without stenosis. Visible right MCA branches are within normal limits. Left MCA M1 segment is patent, but there is asymmetric signal loss at the left MCA trifurcation. However, this is at an area of relative tortuosity and the M2 branches each appear to remain patent distally. See series 253, image 3. Also, all M2 branches appear affected somewhat equally. IMPRESSION: 1. Artifact rather than ELVO suspected at the Left MCA trifurcation, with MRI pending at  this time. But if DWI is positive in the Left MCA territory then an LVO can not be excluded and should be considered. Follow-up CTA Head may be valuable in that scenario. 2. Otherwise negative for age intracranial MRA. These results were communicated to Dr. Cheral Marker at 11:49 am on 10/07/2020 by text page via the [facility] messaging system. Electronically Signed   By: Genevie Ann M.D.   On: 10/07/2020 11:50   MR BRAIN WO CONTRAST  Result Date: 10/07/2020 CLINICAL DATA:  Neuro deficit, acute, stroke suspected EXAM: MRI HEAD WITHOUT CONTRAST TECHNIQUE: Multiplanar, multiecho pulse sequences of  the brain and surrounding structures were obtained without intravenous contrast. COMPARISON:  Same day CT head. FINDINGS: Brain: Two small acute infarcts in the right precentral gyrus (series 2, image 42) and the anterior right frontal white matter (series 2, image 30). No significant edema or mass effect. Remote infarcts in the right inferior cerebellum and right temporal lobe white matter. Additional mild for age scattered T2 hyperintensities within the white matter, nonspecific but compatible with chronic microvascular ischemic disease. Mild for age atrophy with ex vacuo ventricular dilation. No hydrocephalus. No acute hemorrhage, mass lesion, midline shift, or extra-axial fluid collection. Small focus of susceptibility artifact within the right frontal white matter, suggestive of prior microhemorrhage. Mild/faint susceptibility artifact in the inferior right cerebellum in the region of encephalomalacia, likely prior petechial hemorrhage. Vascular: Further evaluated on same day MRA. Skull and upper cervical spine: Normal marrow signal. Sinuses/Orbits: Mild paranasal sinus mucosal thickening. No acute orbital findings. Other: Small right mastoid effusion. IMPRESSION: 1. Small acute infarcts in the right precentral gyrus and the anterior right frontal white matter. No significant edema or mass effect. 2. Remote infarcts in the right inferior cerebellum and right temporal lobe. 3. Mild for age chronic microvascular ischemic disease and atrophy. Electronically Signed   By: Margaretha Sheffield M.D.   On: 10/07/2020 13:13   ECHOCARDIOGRAM COMPLETE  Result Date: 10/07/2020    ECHOCARDIOGRAM REPORT   Patient Name:   RIEN MARLAND Glahn Date of Exam: 10/07/2020 Medical Rec #:  [id number]         Height:       71.0 in Accession #:    [id number]        Weight:       230.6 lb Date of Birth:  08-07-1925         BSA:          2.240 m Patient Age:    43 years          BP:           154/71 mmHg Patient Gender: M                  HR:           55 bpm. Exam Location:  Inpatient Procedure: 2D Echo, Cardiac Doppler and Color Doppler Indications:    CVA  History:        Patient has prior history of Echocardiogram examinations, most                 recent 12/15/2019. Angina, Signs/Symptoms:Chest Pain; Risk                 Factors:Hypertension and Diabetes.  Sonographer:    [place] Referring Phys: [place]  1. Left ventricular ejection fraction, by estimation, is 50 to 55%. The left ventricle has low normal function. The left ventricle has no regional wall motion abnormalities. Left ventricular  diastolic parameters are consistent with Grade I diastolic dysfunction (impaired relaxation).  2. Right ventricular systolic function is normal. The right ventricular size is normal.  3. Left atrial size was severely dilated.  4. The mitral valve is normal in structure. No evidence of mitral valve regurgitation. No evidence of mitral stenosis.  5. The aortic valve is tricuspid. There is mild calcification of the aortic valve. There is mild thickening of the aortic valve. Aortic valve regurgitation is trivial. Mild aortic valve sclerosis is present, with no evidence of aortic valve stenosis. Aortic valve area, by VTI measures 3.54 cm. Aortic valve mean gradient measures 4.0 mmHg. Aortic valve Vmax measures 1.22 m/s.  6. Aortic dilatation noted. 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  7. The inferior vena cava is normal in size with greater than 50% respiratory variability, suggesting right atrial pressure of 3 mmHg. FINDINGS  Left Ventricle: Left ventricular ejection fraction, by estimation, is 50 to 55%. The left ventricle has low normal function. The left ventricle has no regional wall motion abnormalities. The left ventricular internal cavity size was normal in size. There is no left ventricular hypertrophy. Left ventricular diastolic parameters are consistent with Grade I diastolic  dysfunction (impaired relaxation). Right Ventricle: The right ventricular size is normal. No increase in right ventricular wall thickness. Right ventricular systolic function is normal. Left Atrium: Left atrial size was severely dilated. Right Atrium: Right atrial size was normal in size. Pericardium: There is no evidence of pericardial effusion. Mitral Valve: The mitral valve is normal in structure. No evidence of mitral valve regurgitation. No evidence of mitral valve stenosis. MV peak gradient, 2.7 mmHg. The mean mitral valve gradient is 1.0 mmHg. Tricuspid Valve: The tricuspid valve is normal in structure. Tricuspid valve regurgitation is trivial. No evidence of tricuspid stenosis. Aortic Valve: The aortic valve is tricuspid. There is mild calcification of the aortic valve. There is mild thickening of the aortic valve. Aortic valve regurgitation is trivial. Aortic regurgitation PHT measures 706 msec. Mild aortic valve sclerosis is present, with no evidence of aortic valve stenosis. Aortic valve mean gradient measures 4.0 mmHg. Aortic valve peak gradient measures 6.0 mmHg. Aortic valve area, by VTI measures 3.54 cm. Pulmonic Valve: The pulmonic valve was normal in structure. Pulmonic valve regurgitation is trivial. No evidence of pulmonic stenosis. Aorta: Aortic dilatation noted. 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 Venous: The inferior vena cava is normal in size with greater than 50% respiratory variability, suggesting right atrial pressure of 3 mmHg. IAS/Shunts: No atrial level shunt detected by color flow Doppler.  LEFT VENTRICLE PLAX 2D LVIDd:         3.40 cm     Diastology LVIDs:         2.50 cm     LV e' medial:    4.68 cm/s LV PW:         1.80 cm     LV E/e' medial:  14.7 LV IVS:        2.50 cm     LV e' lateral:   9.79 cm/s LVOT diam:     2.30 cm     LV E/e' lateral: 7.0 LV SV:         112 LV SV Index:   50 LVOT Area:     4.15 cm  LV Volumes (MOD)  LV vol d, MOD A4C: 59.7 ml LV vol  s, MOD A4C: 27.5 ml LV SV MOD A4C:     59.7 ml RIGHT VENTRICLE RV S prime:     8.27 cm/s TAPSE (M-mode): 1.2 cm LEFT ATRIUM           Index       RIGHT ATRIUM           Index LA diam:      3.50 cm 1.56 cm/m  RA Area:     12.50 cm LA Vol (A2C): 98.9 ml 44.15 ml/m RA Volume:   21.20 ml  9.46 ml/m LA Vol (A4C): 48.8 ml 21.78 ml/m  AORTIC VALVE                   PULMONIC VALVE AV Area (Vmax):    3.11 cm    PV Vmax:       0.62 m/s AV Area (Vmean):   2.97 cm    PV Peak grad:  1.5 mmHg AV Area (VTI):     3.54 cm AV Vmax:           122.00 cm/s AV Vmean:          91.000 cm/s AV VTI:            0.317 m AV Peak Grad:      6.0 mmHg AV Mean Grad:      4.0 mmHg LVOT Vmax:         91.40 cm/s LVOT Vmean:        65.100 cm/s LVOT VTI:          0.270 m LVOT/AV VTI ratio: 0.85 AI PHT:            706 msec  AORTA Ao Root diam: 4.60 cm Ao Asc diam:  4.60 cm MITRAL VALVE MV Area (PHT): 2.12 cm    SHUNTS MV Area VTI:   3.70 cm    Systemic VTI:  0.27 m MV Peak grad:  2.7 mmHg    Systemic Diam: 2.30 cm MV Mean grad:  1.0 mmHg MV Vmax:       0.82 m/s MV Vmean:      58.5 cm/s MV E velocity: 69.00 cm/s MV A velocity: 74.40 cm/s MV E/A ratio:  0.93 Skeet Latch MD Electronically signed by Skeet Latch MD Signature Date/Time: 10/07/2020/3:23:19 PM    Final     PHYSICAL EXAM  Mental Status: Alert, fully oriented, thought content appropriate.  Speech fluent with intact comprehension and naming. First attempt at repetition with mild error, second attempt with a more difficult phrase was normal. He did have an approximately 10 second period of hesitant speech that then normalized.  Diminished recall 1/3.  Able to name only 7 animals which can walk on 4 legs.  Clock drawing 3/4.  Able to copy intersecting pentagons with difficulty Cranial Nerves: II:  Visual fields intact in right eye with no extinction to DSS. Right pupil 2 mm and reactive. Left pupil 1 mm and nonreactive. Visual acuity right  eye intact. Left eye visual acuity at baseline of severe visual loss; able to see light and dark only.  III,IV, VI: No ptosis. Mild exotropia. EOMI. No nystagmus.   V,VII: Smile symmetric, facial temp sensation equal bilaterally VIII: hearing intact to voice IX,X: No hypophonia XI: Symmetric XII: midline tongue extension  Motor: Right :  Upper extremity   5/5  Left:     Upper extremity   5/5             Lower extremity   5/5                                                  Lower extremity   5/5 No pronator drift.  Sensory: FT and temp intact with equivocal patchy sensory deficit to RUE. No extinction to DSS.  Deep Tendon Reflexes:  1+ bilateral brachioradialis and biceps. Unable to elicit patellar or achilles reflexes. Toes downgoing bilaterally.  Cerebellar: No ataxia with FNF bilaterally  Gait: deferred   ASSESSMENT/PLAN Mr. Willam Munford is a 87 y.o. male with history of atrial fibrillation (not on anticoagulation), left eye blindness, cancer (left kidney), arthritis, cataract, CKD, DM, glaucoma, HTN, neuropathy and prior stroke who presents from home with acute onset of expressive dysphasia (speaking and texting). The patient was LKN at 0800 when he went to church. At 0830 he made a telephone call to family and they noticed that his speech was garbled. When he tried to text them, the texts also were unintelligible. EMS was called and on arrival the patient's speech had normalized. En route to the ED, at about 10:00 AM, he had recurrence of expressive aphasia, during which EMS noted that he "could not get his words out", in conjunction with right facial droop, which both rapidly resolved. No evidence for seizure activity. Vitals in the field: BP 160/70, HR 60, 97% on RA, CBG 363.    The patient is on ASA at home.   MRI brain shows small acute infarcts in the right precentral gyrus and anterior right frontal white matter as well as remote infarcts in  the right inferior cerebellum and right temporal lobe.   Stroke:  right precentral gyrus infarct embolic secondary to cardioembolic source possibly atrial fibrillation.  Patient has baseline mild cognitive impairment as well as his fall risk with multiple falls and lives alone not very compliant with medications and is not a good long-term anticoagulation candidate and so we will not do prolonged cardiac monitoring for A. fib Code Stroke  CT head No acute abnormality.  Small vessel disease. Atrophy.  ASPECTS 10.      MRI  BRAIN:  1. Small acute infarcts in the right precentral gyrus and the anterior right frontal white matter. No significant edema or mass effect. 2. Remote infarcts in the right inferior cerebellum and right temporal lobe. MRA   HEAD: 1. Artifact rather than LVO suspected at the Left MCA trifurcation, with MRI pending at this time. But if DWI is positive in the Left MCA territory then an LVO can not be excluded and should be considered. Follow-up CTA Head may be valuable in that scenario.   2. Otherwise negative for age intracranial MRA. Carotid Doppler bilateral no significant extracranial carotid stenosis  2D Echo  LVEF 50-55%, severely dilated LA, no IA shunt   LDL 13 HgbA1c 9.3 VTE prophylaxis - scd    Diet   Diet heart healthy/carb modified Room service appropriate? Yes; Fluid consistency: Thin   aspirin 81 mg daily prior to admission (PT REPORTS THAT HE MISSES DOSES REGULARLY, now on aspirin 81 mg daily.   Therapy recommendations:  PENDING Disposition:  pending  Hypertension Home meds:  norvasc Stable Permissive hypertension (OK if < 220/120) but  gradually normalize in 5-7 days Long-term BP goal normotensive   LDL 13, goal < 70   Diabetes type II Uncontrolled Home meds:  glimepiride, insulin HgbA1c 9.3, goal < 7.0 CBGs Recent Labs    10/07/20 1631 10/07/20 2246 10/08/20 0803  GLUCAP 254* 265* 202*    SSI  Other Stroke Risk Factors  Advanced Age  >/= 55   Obesity, Body mass index is 32.16 kg/m., BMI >/= 30 associated with increased stroke risk, recommend weight loss, diet and exercise as appropriate   Hx stroke/TIA  Other Active Problems    Hospital day # 0   I have personally obtained history,examined this patient, reviewed notes, independently viewed imaging studies, participated in medical decision making and plan of care.ROS completed by me personally and pertinent positives fully documented  I have made any additions or clarifications directly to the above note. Agree with note above.  Patient presented with episode of expressive aphasia likely due to embolic left MCA branch infarct possibly from underlying A. fib.  The patient daughter however feel that he is high fall risk, has mild cognitive impairment and lives alone independently and often forgets his medicines.  NSAI recommend will not do prolonged cardiac monitoring for A. fib as not a good long-term anticoagulation candidate.  Continue aspirin 81 mg daily and ongoing stroke work-up.  Discussed with patient, 2 daughters and Dr. Emeline Gins.  Greater than 50% time during this 35-minute visit was spent in counseling and coordination of care and discussion with care team about his stroke evaluation, prevention and treatment and answering questions.  Stroke team will sign off.  Kindly call for questions  Antony Contras, MD Medical Director [place] Pager: [phone] 10/08/2020 6:14 PM   To contact Stroke Continuity provider, please refer to [email or web address] After hours, contact General Neurology

## 2020-10-08 NOTE — ED Notes (Signed)
Overnight coverage notified of patient's rhythm with prolonged PR, intermittently dropping a beat but not consistent. A one point monitor appears to show Afib but may be low voltage P-wave as I reviewed telemetry further and could not find true captured Afib. No clinical change in patient as he has remained oriented when he wakes, neuro exam remains unchanged and fully intact aside from chronic/baseline left eye poor vision. He stands to use urinal with fairly good balance.

## 2020-10-08 NOTE — Progress Notes (Signed)
Carotid duplex has been completed.  Results can be found under chart review under CV PROC. 10/08/2020 5:26 PM Maleeya Peterkin RVT, RDMS

## 2020-10-08 NOTE — TOC Progression Note (Addendum)
Transition of Care [facility]) - Progression Note    Patient Details  Name: Martin Richardson MRN: [id number] Date of Birth: 08-Jun-1925  Transition of Care [facility]) CM/SW Contact  Loletha Grayer Beverely Pace, RN Phone Number: 10/08/2020, 3:43 PM  Clinical Narrative: Patient is a 87 yr old. male with medical history significant of RCC s/p nephrectomy with stage 4 CKD; CAD; DM; HTN; and h/o CVA presenting with aphasia.    Case manager spoke with patient's daughter, [name] [phone], concerning her dad's discharge needs. Patient has been living independently prior to admission, with family checking in on him frequently. He has a cane, walker, and wheelchair at home, but basically uses his cane. Discussed [place] and was given permission to call referral to [facility], Adela Lank. They will also be looking into private duty care for their dad, and will use their personal contacts for that. Private duty list provided as well. [name] states that her dad will spend a week at her home and then a week at her sisters home. She will either contact Case manager later today with address for which home he will go to first or call [facility].   1627: Patient will go to Judith's home at discharge: [place], [place] [address], CM will provide this address to [facility]   Expected Discharge Plan: [place] Barriers to Discharge: No Barriers Identified  Expected Discharge Plan and Services Expected Discharge Plan: [place] In-house Referral: NA Discharge Planning Services: CM Consult Post Acute Care Choice: [place] arrangements for the past 2 months: Apartment                 DME Arranged: N/A DME Agency: NA       HH Arranged: RN, PT, OT, Nurse's Aide [place] Agency: [place] Date [place]: 10/08/20 Time [place]: [phone] Representative spoke with at [place]: [place] (SDOH) Interventions    Readmission Risk Interventions No flowsheet data found.

## 2020-10-08 NOTE — Progress Notes (Signed)
Pt arrived to floor around 0900 via stretcher from ED by ED tech. Pt amb from stretcher to bed x 1 assist, unsteady gait noted. Pt stated he uses walker or cane at home when ambulating. VSS. Respirations even and unlabored on room air. Assessment completed. Skin assessment completed with Precious Gilding, RN. Pt oriented to room. Pt instructed on use of call bell and encouraged to call for assistance. Call bell within reach. Bed in low position. Bed alarm on.

## 2020-10-08 NOTE — Discharge Summary (Signed)
Physician Discharge Summary  Martin Richardson EUM:[id number] DOB: [birth year] DOA: 10/07/2020  PCP: Kristen Loader, FNP  Admit date: 10/07/2020 Discharge date: 10/08/2020  Admitted From: Home Disposition:  Home   Recommendations for Outpatient Follow-up:  Follow up with PCP in 1-2 weeks Patient to follow with neurology as an outpatient Norvasc in 3 days   Home Health:YES  Discharge Condition:Stable CODE STATUS:FULL Diet recommendation: Heart Healthy / Carb Modified   Brief/Interim Summary:    HPI: Martin Richardson is a 87 y.o. male with medical history significant of RCC s/p nephrectomy with stage 4 CKD; CAD; DM; HTN; and h/o CVA presenting with aphasia.  He went to church this AM and we listening to the 911 radio broadcasts.  He talked to his daughter and wasn't making sense. He went home again because they was no early church session and he noticed increased difficulty talking.  His daughter called 911.  No other neurologic symptoms.  MRI was significant for acute CVA, he was admitted for further work-up.   CVA -Patient with prior h/o CVA 1-2 years ago presenting with onset of aphasia , symptoms have been fluctuating throughout the day -No other neurologic symptoms -MRI confirms CVA -Patient lives by home by himself, he will be discharged to stay with his daughter for few days, where he will have 24-hour supervision.  We will have home health PT/OT/aide/RN. - MRI  BRAIN  Small acute infarcts in the right precentral gyrus and the anterior right frontal white matter. No significant edema or mass effect. And  Remote infarcts in the right inferior cerebellum and right temporal lobe. - MRA   HEAD, Artifact rather than LVO suspected at the Left MCA trifurcation, with MRI pending at this time. But if DWI is positive in the Left MCA territory then an LVO can not be excluded and should be considered.  - 2D Echo  LVEF 50-55%, severely dilated LA, no IA shunt - LDL 13 -Carotid Doppler with  no significant carotid disease, bilateral vertebral arteries demonstrate antegrade flow, has left subclavian artery stenosis -Discussed with neurology, patient has not been fully compliant with his aspirin, at this point recommendation is to continue with aspirin EC 81 mg daily.  HTN -Allow permissive HTN for now, will hold hold norvasc for next 3 days.   HLD -on Lipitor   DM -A1c is elevated at 9.3 -will hold amaryl discharge, continue with Lantus, will increase dose to 40 units at bedtime, 45 units daytime.  CAD -Continue ASA -No current anginal complaints   Stage 4 CKD -Appears to be stable at this time -Attempt to avoid nephrotoxic agents   Hypothyroidism -Continue Synthroid at current dose for now        Discharge Diagnoses:  Principal Problem:   Acute CVA (cerebrovascular accident) ([place]) Active Problems:   CKD (chronic kidney disease), stage IV ([place])   DM (diabetes mellitus), type 2 with renal complications ([place])   Essential hypertension   Hypothyroidism (acquired)    Discharge Instructions  Discharge Instructions     Ambulatory referral to Neurology   Complete by: As directed    An appointment is requested in approximately: 4 weeks   Diet - low sodium heart healthy   Complete by: As directed    Discharge instructions   Complete by: As directed    Follow with Primary MD Kristen Loader, FNP in 7 days   Get CBC, CMP, checked  by Primary MD next visit.    Activity: As tolerated with  Full fall precautions use walker/cane & assistance as needed   Disposition Home   Diet: Heart Healthy / carb modified  .  On your next visit with your primary care physician please Get Medicines reviewed and adjusted.   Please request your Prim.MD to go over all Hospital Tests and Procedure/Radiological results at the follow up, please get all Hospital records sent to your Prim MD by signing hospital release before you go home.   If you experience worsening of your  admission symptoms, develop shortness of breath, life threatening emergency, suicidal or homicidal thoughts you must seek medical attention immediately by calling 911 or calling your MD immediately  if symptoms less severe.  You Must read complete instructions/literature along with all the possible adverse reactions/side effects for all the Medicines you take and that have been prescribed to you. Take any new Medicines after you have completely understood and accpet all the possible adverse reactions/side effects.   Do not drive, operating heavy machinery, perform activities at heights, swimming or participation in water activities or provide baby sitting services if your were admitted for syncope or siezures until you have seen by Primary MD or a Neurologist and advised to do so again.  Do not drive when taking Pain medications.    Do not take more than prescribed Pain, Sleep and Anxiety Medications  Special Instructions: If you have smoked or chewed Tobacco  in the last 2 yrs please stop smoking, stop any regular Alcohol  and or any Recreational drug use.  Wear Seat belts while driving.   Please note  You were cared for by a hospitalist during your hospital stay. If you have any questions about your discharge medications or the care you received while you were in the hospital after you are discharged, you can call the unit and asked to speak with the hospitalist on call if the hospitalist that took care of you is not available. Once you are discharged, your primary care physician will handle any further medical issues. Please note that NO REFILLS for any discharge medications will be authorized once you are discharged, as it is imperative that you return to your primary care physician (or establish a relationship with a primary care physician if you do not have one) for your aftercare needs so that they can reassess your need for medications and monitor your lab values.   Increase activity  slowly   Complete by: As directed       Allergies as of 10/08/2020       Reactions   Other Other (See Comments)   Other reaction(s): rash   Adhesive [tape] Rash        Medication List     STOP taking these medications    aspirin 81 MG chewable tablet Replaced by: aspirin 81 MG EC tablet   glimepiride 4 MG tablet Commonly known as: AMARYL       TAKE these medications    acetaminophen 325 MG tablet Commonly known as: TYLENOL Take 650 mg by mouth every 4 (four) hours as needed for mild pain or headache.   Align 4 MG Caps Take 4 mg by mouth daily as needed (constipation).   amLODipine 5 MG tablet Commonly known as: NORVASC Take 1 tablet (5 mg total) by mouth daily. Hold for next 3 days and start on 9/15 Start taking on: October 11, 2020 What changed:  additional instructions These instructions start on October 11, 2020. If you are unsure what to do until then, ask  your doctor or other care provider.   aspirin 81 MG EC tablet Take 1 tablet (81 mg total) by mouth daily. Swallow whole. Start taking on: October 09, 2020 Replaces: aspirin 81 MG chewable tablet   atorvastatin 80 MG tablet Commonly known as: LIPITOR Take 80 mg by mouth daily.   betamethasone valerate 0.1 % cream Commonly known as: VALISONE Apply 1 application topically every Monday, Wednesday, and Friday. For psoriasis -  HOLD   cholecalciferol 25 MCG (1000 UNIT) tablet Commonly known as: VITAMIN D3 1 tablet   docusate sodium 100 MG capsule Commonly known as: COLACE Take 100 mg by mouth 2 (two) times daily.   famotidine 20 MG tablet Commonly known as: PEPCID Take 20 mg by mouth at bedtime.   Fiber 625 MG Tabs Take 2 tablets by mouth as needed.   gabapentin 100 MG capsule Commonly known as: NEURONTIN Take 2 capsules (200 mg total) by mouth at bedtime.   insulin glargine 100 UNIT/ML injection Commonly known as: LANTUS Lantus 45 units every morning, 40 units evening time, What  changed:  how much to take how to take this when to take this additional instructions   Insulin Pen Needle 29G X 12MM Misc Use as directed   levothyroxine 125 MCG tablet Commonly known as: SYNTHROID Take 125 mcg by mouth daily before breakfast.   OneTouch Ultra test strip Generic drug: glucose blood 1 each 2 (two) times daily.   polyethylene glycol 17 g packet Commonly known as: MIRALAX / GLYCOLAX Take 17 g by mouth daily as needed.   senna 8.6 MG Tabs tablet Commonly known as: SENOKOT Take 1 tablet (8.6 mg total) by mouth daily.   tamsulosin 0.4 MG Caps capsule Commonly known as: FLOMAX Take 1 capsule (0.4 mg total) by mouth daily.   timolol 0.5 % ophthalmic solution Commonly known as: TIMOPTIC SMARTSIG:In Eye(s)        Follow-up Information     [facility], [facility] Follow up.   Specialty: [place] Why: A representative from [facility] will contact you to arrange start date and time for your services. Contact information: [address] [place] [address] [phone]         Kristen Loader, FNP Follow up.   Specialty: Family Medicine Contact information: [place] [address] [phone]                Allergies  Allergen Reactions   Other Other (See Comments)    Other reaction(s): rash   Adhesive [Tape] Rash    Consultations: neurology   Procedures/Studies: MR ANGIO HEAD WO CONTRAST  Result Date: 10/07/2020 CLINICAL DATA:  87 year old male code stroke presentation, aphasia. EXAM: MRA HEAD WITHOUT CONTRAST TECHNIQUE: Angiographic images of the Circle of Willis were acquired using MRA technique without intravenous contrast. COMPARISON:  Plain head CT today at 1013 hours. MRI brain is pending at this time. FINDINGS: No intracranial mass effect or midline shift. Antegrade flow in the posterior circulation. Fairly codominant distal vertebral arteries with patent PICA origins and no  distal vertebral stenosis. Patent vertebrobasilar junction and basilar artery without stenosis. Normal SCA and PCA origins. Posterior communicating arteries are diminutive or absent. Bilateral PCA branches are within normal limits. Antegrade flow in both ICA siphons. Mild siphon irregularity with no distal ICA stenosis. Patent carotid termini. Normal ophthalmic artery origins. A small infundibulum of the cavernous right ICA suspected on series 2, image 73, likely hypophyseal artery related. Patent MCA  and ACA origins. ACA A1 segments, anterior communicating artery and visible ACA branches are within normal limits. Right MCA M1 segment and trifurcation are patent without stenosis. Visible right MCA branches are within normal limits. Left MCA M1 segment is patent, but there is asymmetric signal loss at the left MCA trifurcation. However, this is at an area of relative tortuosity and the M2 branches each appear to remain patent distally. See series 253, image 3. Also, all M2 branches appear affected somewhat equally. IMPRESSION: 1. Artifact rather than ELVO suspected at the Left MCA trifurcation, with MRI pending at this time. But if DWI is positive in the Left MCA territory then an LVO can not be excluded and should be considered. Follow-up CTA Head may be valuable in that scenario. 2. Otherwise negative for age intracranial MRA. These results were communicated to Dr. Cheral Marker at 11:49 am on 10/07/2020 by text page via the [facility] messaging system. Electronically Signed   By: Genevie Ann M.D.   On: 10/07/2020 11:50   MR BRAIN WO CONTRAST  Result Date: 10/07/2020 CLINICAL DATA:  Neuro deficit, acute, stroke suspected EXAM: MRI HEAD WITHOUT CONTRAST TECHNIQUE: Multiplanar, multiecho pulse sequences of the brain and surrounding structures were obtained without intravenous contrast. COMPARISON:  Same day CT head. FINDINGS: Brain: Two small acute infarcts in the right precentral gyrus (series 2, image 42) and the anterior  right frontal white matter (series 2, image 30). No significant edema or mass effect. Remote infarcts in the right inferior cerebellum and right temporal lobe white matter. Additional mild for age scattered T2 hyperintensities within the white matter, nonspecific but compatible with chronic microvascular ischemic disease. Mild for age atrophy with ex vacuo ventricular dilation. No hydrocephalus. No acute hemorrhage, mass lesion, midline shift, or extra-axial fluid collection. Small focus of susceptibility artifact within the right frontal white matter, suggestive of prior microhemorrhage. Mild/faint susceptibility artifact in the inferior right cerebellum in the region of encephalomalacia, likely prior petechial hemorrhage. Vascular: Further evaluated on same day MRA. Skull and upper cervical spine: Normal marrow signal. Sinuses/Orbits: Mild paranasal sinus mucosal thickening. No acute orbital findings. Other: Small right mastoid effusion. IMPRESSION: 1. Small acute infarcts in the right precentral gyrus and the anterior right frontal white matter. No significant edema or mass effect. 2. Remote infarcts in the right inferior cerebellum and right temporal lobe. 3. Mild for age chronic microvascular ischemic disease and atrophy. Electronically Signed   By: Margaretha Sheffield M.D.   On: 10/07/2020 13:13   ECHOCARDIOGRAM COMPLETE  Result Date: 10/07/2020    ECHOCARDIOGRAM REPORT   Patient Name:   Martin Richardson Date of Exam: 10/07/2020 Medical Rec #:  [id number]         Height:       71.0 in Accession #:    [id number]        Weight:       230.6 lb Date of Birth:  09-04-1925         BSA:          2.240 m Patient Age:    80 years          BP:           154/71 mmHg Patient Gender: M                 HR:           55 bpm. Exam Location:  Inpatient Procedure: 2D Echo, Cardiac Doppler and Color Doppler Indications:    CVA  History:        Patient has prior history of Echocardiogram examinations, most                 recent  12/15/2019. Angina, Signs/Symptoms:Chest Pain; Risk                 Factors:Hypertension and Diabetes.  Sonographer:    [place] Referring Phys: [place]  1. Left ventricular ejection fraction, by estimation, is 50 to 55%. The left ventricle has low normal function. The left ventricle has no regional wall motion abnormalities. Left ventricular diastolic parameters are consistent with Grade I diastolic dysfunction (impaired relaxation).  2. Right ventricular systolic function is normal. The right ventricular size is normal.  3. Left atrial size was severely dilated.  4. The mitral valve is normal in structure. No evidence of mitral valve regurgitation. No evidence of mitral stenosis.  5. The aortic valve is tricuspid. There is mild calcification of the aortic valve. There is mild thickening of the aortic valve. Aortic valve regurgitation is trivial. Mild aortic valve sclerosis is present, with no evidence of aortic valve stenosis. Aortic valve area, by VTI measures 3.54 cm. Aortic valve mean gradient measures 4.0 mmHg. Aortic valve Vmax measures 1.22 m/s.  6. Aortic dilatation noted. 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  7. The inferior vena cava is normal in size with greater than 50% respiratory variability, suggesting right atrial pressure of 3 mmHg. FINDINGS  Left Ventricle: Left ventricular ejection fraction, by estimation, is 50 to 55%. The left ventricle has low normal function. The left ventricle has no regional wall motion abnormalities. The left ventricular internal cavity size was normal in size. There is no left ventricular hypertrophy. Left ventricular diastolic parameters are consistent with Grade I diastolic dysfunction (impaired relaxation). Right Ventricle: The right ventricular size is normal. No increase in right ventricular wall thickness. Right ventricular systolic function is normal. Left Atrium: Left atrial size was  severely dilated. Right Atrium: Right atrial size was normal in size. Pericardium: There is no evidence of pericardial effusion. Mitral Valve: The mitral valve is normal in structure. No evidence of mitral valve regurgitation. No evidence of mitral valve stenosis. MV peak gradient, 2.7 mmHg. The mean mitral valve gradient is 1.0 mmHg. Tricuspid Valve: The tricuspid valve is normal in structure. Tricuspid valve regurgitation is trivial. No evidence of tricuspid stenosis. Aortic Valve: The aortic valve is tricuspid. There is mild calcification of the aortic valve. There is mild thickening of the aortic valve. Aortic valve regurgitation is trivial. Aortic regurgitation PHT measures 706 msec. Mild aortic valve sclerosis is present, with no evidence of aortic valve stenosis. Aortic valve mean gradient measures 4.0 mmHg. Aortic valve peak gradient measures 6.0 mmHg. Aortic valve area, by VTI measures 3.54 cm. Pulmonic Valve: The pulmonic valve was normal in structure. Pulmonic valve regurgitation is trivial. No evidence of pulmonic stenosis. Aorta: Aortic dilatation noted. 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 Venous: The inferior vena cava is normal in size with greater than 50% respiratory variability, suggesting right atrial pressure of 3 mmHg. IAS/Shunts: No atrial level shunt detected by color flow Doppler.  LEFT VENTRICLE PLAX 2D LVIDd:         3.40 cm     Diastology LVIDs:         2.50 cm     LV e' medial:  4.68 cm/s LV PW:         1.80 cm     LV E/e' medial:  14.7 LV IVS:        2.50 cm     LV e' lateral:   9.79 cm/s LVOT diam:     2.30 cm     LV E/e' lateral: 7.0 LV SV:         112 LV SV Index:   50 LVOT Area:     4.15 cm  LV Volumes (MOD) LV vol d, MOD A4C: 59.7 ml LV vol s, MOD A4C: 27.5 ml LV SV MOD A4C:     59.7 ml RIGHT VENTRICLE RV S prime:     8.27 cm/s TAPSE (M-mode): 1.2 cm LEFT ATRIUM           Index       RIGHT ATRIUM           Index LA diam:       3.50 cm 1.56 cm/m  RA Area:     12.50 cm LA Vol (A2C): 98.9 ml 44.15 ml/m RA Volume:   21.20 ml  9.46 ml/m LA Vol (A4C): 48.8 ml 21.78 ml/m  AORTIC VALVE                   PULMONIC VALVE AV Area (Vmax):    3.11 cm    PV Vmax:       0.62 m/s AV Area (Vmean):   2.97 cm    PV Peak grad:  1.5 mmHg AV Area (VTI):     3.54 cm AV Vmax:           122.00 cm/s AV Vmean:          91.000 cm/s AV VTI:            0.317 m AV Peak Grad:      6.0 mmHg AV Mean Grad:      4.0 mmHg LVOT Vmax:         91.40 cm/s LVOT Vmean:        65.100 cm/s LVOT VTI:          0.270 m LVOT/AV VTI ratio: 0.85 AI PHT:            706 msec  AORTA Ao Root diam: 4.60 cm Ao Asc diam:  4.60 cm MITRAL VALVE MV Area (PHT): 2.12 cm    SHUNTS MV Area VTI:   3.70 cm    Systemic VTI:  0.27 m MV Peak grad:  2.7 mmHg    Systemic Diam: 2.30 cm MV Mean grad:  1.0 mmHg MV Vmax:       0.82 m/s MV Vmean:      58.5 cm/s MV E velocity: 69.00 cm/s MV A velocity: 74.40 cm/s MV E/A ratio:  0.93 Skeet Latch MD Electronically signed by Skeet Latch MD Signature Date/Time: 10/07/2020/3:23:19 PM    Final    CT HEAD CODE STROKE WO CONTRAST  Result Date: 10/07/2020 CLINICAL DATA:  Code stroke.  87 year old male with aphasia. EXAM: CT HEAD WITHOUT CONTRAST TECHNIQUE: Contiguous axial images were obtained from the base of the skull through the vertex without intravenous contrast. COMPARISON:  None. FINDINGS: Brain: No midline shift, mass effect, or evidence of intracranial mass lesion. No ventriculomegaly. No acute intracranial hemorrhage identified. Chronic appearing small linear infarct in the right cerebellum PICA territory. Patchy bilateral white matter hypodensity is relatively symmetric. No cortically based acute infarct identified. No cortical encephalomalacia identified. Vascular: Mild  Calcified atherosclerosis at the skull base. No suspicious intracranial vascular hyperdensity. Skull: No acute osseous abnormality identified. Sinuses/Orbits: Mild  bilateral paranasal sinus mucosal thickening. Mild right mastoid effusion. Other: Small volume retained secretions in the nasopharynx. Postoperative changes to both globes. No acute orbit or scalp soft tissue finding. There is a small right forehead subcutaneous lipoma on series 3, image 58 (benign). ASPECTS [facility] Stroke Program Early CT Score) Total score (0-10 with 10 being normal): 10 IMPRESSION: 1. No acute cortically based infarct or acute intracranial hemorrhage identified. ASPECTS 10. 2. Small chronic appearing Right PICA infarct. Mild for age cerebral white matter changes. 3. These results were communicated to Dr. Cheral Marker at 10:20 am on 10/07/2020 by text page via the [facility] messaging system. Electronically Signed   By: Genevie Ann M.D.   On: 10/07/2020 10:20   VAS US CAROTID  Result Date: 10/08/2020 Carotid Arterial Duplex Study Patient Name:  Martin Richardson  Date of Exam:   10/08/2020 Medical Rec #: [id number]          Accession #:    [id number] Date of Birth: 04-04-1925          Patient Gender: M Patient Age:   66 years Exam Location:  [facility] Procedure:      VAS US CAROTID Referring Phys: Lelan Pons FRANCOIS --------------------------------------------------------------------------------  Indications:       CVA and aphasia. Risk Factors:      Hypertension, hyperlipidemia, Diabetes, past history of                    smoking, coronary artery disease, prior CVA. Other Factors:     CKD4. Comparison Study:  No previous exams Performing Technologist: Jody Hill RVT, RDMS  Examination Guidelines: A complete evaluation includes B-mode imaging, spectral Doppler, color Doppler, and power Doppler as needed of all accessible portions of each vessel. Bilateral testing is considered an integral part of a complete examination. Limited examinations for reoccurring indications may be performed as noted.  Right Carotid Findings: +----------+--------+--------+--------+------------------+------------------+            PSV cm/sEDV cm/sStenosisPlaque DescriptionComments           +----------+--------+--------+--------+------------------+------------------+ CCA Prox  75      8                                 intimal thickening +----------+--------+--------+--------+------------------+------------------+ CCA Distal66      8                                 intimal thickening +----------+--------+--------+--------+------------------+------------------+ ICA Prox  58      16                                                   +----------+--------+--------+--------+------------------+------------------+ ICA Distal63      16                                                   +----------+--------+--------+--------+------------------+------------------+ ECA       54      0                                                    +----------+--------+--------+--------+------------------+------------------+ +----------+--------+-------+----------------+-------------------+  PSV cm/sEDV cmsDescribe        Arm Pressure (mmHG) +----------+--------+-------+----------------+-------------------+ [id number]             Multiphasic, WNL                    +----------+--------+-------+----------------+-------------------+ +---------+--------+--+--------+--+---------+ VertebralPSV cm/s65EDV cm/s15Antegrade +---------+--------+--+--------+--+---------+  Left Carotid Findings: +----------+--------+--------+--------+------------------+------------------+           PSV cm/sEDV cm/sStenosisPlaque DescriptionComments           +----------+--------+--------+--------+------------------+------------------+ CCA Prox  87      9                                 intimal thickening +----------+--------+--------+--------+------------------+------------------+ CCA Distal92      9               hypoechoic        intimal thickening  +----------+--------+--------+--------+------------------+------------------+ ICA Prox  58      14                                                   +----------+--------+--------+--------+------------------+------------------+ ICA Distal63      13                                                   +----------+--------+--------+--------+------------------+------------------+ ECA       73      0                                                    +----------+--------+--------+--------+------------------+------------------+ +----------+--------+--------+--------+-------------------+           PSV cm/sEDV cm/sDescribeArm Pressure (mmHG) +----------+--------+--------+--------+-------------------+ [id number]             Stenotic                    +----------+--------+--------+--------+-------------------+ +---------+--------+--+--------+-+---------+ VertebralPSV cm/s29EDV cm/s6Antegrade +---------+--------+--+--------+-+---------+   Summary: Right Carotid: The extracranial vessels were near-normal with only minimal wall                thickening or plaque. Left Carotid: The extracranial vessels were near-normal with only minimal wall               thickening or plaque. Vertebrals:  Bilateral vertebral arteries demonstrate antegrade flow. Subclavians: Left subclavian artery was stenotic. Normal flow hemodynamics were              seen in the right subclavian artery. *See table(s) above for measurements and observations.     Preliminary       Subjective: Denies any complaints currently, he is eager to go home.  Discharge Exam: Vitals:   10/08/20 1147 10/08/20 1552  BP: (!) 141/93 (!) 154/80  Pulse: (!) 52 (!) 51  Resp: 13 20  Temp: (!) 97.4 F (36.3 C) 97.6 F (36.4 C)  SpO2: 99% 97%   Vitals:   10/08/20 0815 10/08/20 0917 10/08/20 1147 10/08/20 1552  BP: 122/64 (!) 153/72 (!) 141/93 (!) 154/80  Pulse: 60 (!) 53 Marland Kitchen)  52 (!) 51  Resp: 14 12 13 20   Temp:  97.7 F  (36.5 C) (!) 97.4 F (36.3 C) 97.6 F (36.4 C)  TempSrc:  Oral Oral Oral  SpO2: 95% 97% 99% 97%  Weight:  106.5 kg    Height:  5\' 11"  (1.803 m)      General: Pt is alert, awake, not in acute distress Cardiovascular: RRR, S1/S2 +, no rubs, no gallops Respiratory: CTA bilaterally, no wheezing, no rhonchi Abdominal: Soft, NT, ND, bowel sounds + Extremities: no edema, no cyanosis    The results of significant diagnostics from this hospitalization (including imaging, microbiology, ancillary and laboratory) are listed below for reference.     Microbiology: Recent Results (from the past 240 hour(s))  Resp Panel by RT-PCR (Flu A&B, Covid) Nasopharyngeal Swab     Status: None   Collection Time: 10/07/20 10:45 AM   Specimen: Nasopharyngeal Swab; Nasopharyngeal(NP) swabs in vial transport medium  Result Value Ref Range Status   SARS Coronavirus 2 by RT PCR NEGATIVE NEGATIVE Final    Comment: (NOTE) SARS-CoV-2 target nucleic acids are NOT DETECTED.  The SARS-CoV-2 RNA is generally detectable in upper respiratory specimens during the acute phase of infection. The lowest concentration of SARS-CoV-2 viral copies this assay can detect is 138 copies/mL. A negative result does not preclude SARS-Cov-2 infection and should not be used as the sole basis for treatment or other patient management decisions. A negative result may occur with  improper specimen collection/handling, submission of specimen other than nasopharyngeal swab, presence of viral mutation(s) within the areas targeted by this assay, and inadequate number of viral copies(<138 copies/mL). A negative result must be combined with clinical observations, patient history, and epidemiological information. The expected result is Negative.  Fact Sheet for Patients:  EntrepreneurPulse.com.au  Fact Sheet for Healthcare Providers:  IncredibleEmployment.be  This test is no t yet approved or cleared  by the [place] FDA and  has been authorized for detection and/or diagnosis of SARS-CoV-2 by FDA under an Emergency Use Authorization (EUA). This EUA will remain  in effect (meaning this test can be used) for the duration of the COVID-19 declaration under Section 564(b)(1) of the Act, 21 U.S.C.section 360bbb-3(b)(1), unless the authorization is terminated  or revoked sooner.       Influenza A by PCR NEGATIVE NEGATIVE Final   Influenza B by PCR NEGATIVE NEGATIVE Final    Comment: (NOTE) The Xpert Xpress SARS-CoV-2/FLU/RSV plus assay is intended as an aid in the diagnosis of influenza from Nasopharyngeal swab specimens and should not be used as a sole basis for treatment. Nasal washings and aspirates are unacceptable for Xpert Xpress SARS-CoV-2/FLU/RSV testing.  Fact Sheet for Patients: EntrepreneurPulse.com.au  Fact Sheet for Healthcare Providers: IncredibleEmployment.be  This test is not yet approved or cleared by the [place] FDA and has been authorized for detection and/or diagnosis of SARS-CoV-2 by FDA under an Emergency Use Authorization (EUA). This EUA will remain in effect (meaning this test can be used) for the duration of the COVID-19 declaration under Section 564(b)(1) of the Act, 21 U.S.C. section 360bbb-3(b)(1), unless the authorization is terminated or revoked.  Performed at [facility], [place] [address] [place], [place] [address]      Labs: BNP (last 3 results) Recent Labs    12/15/19 0124  BNP 19.1   Basic Metabolic Panel: Recent Labs  Lab 10/07/20 1003 10/07/20 1017  NA 134* 137  K 4.5 4.5  CL 107 106  CO2 20*  --  GLUCOSE 295* 286*  BUN 37* 36*  CREATININE 2.08* 2.10*  CALCIUM 9.6  --    Liver Function Tests: Recent Labs  Lab 10/07/20 1003  AST 13*  ALT 15  ALKPHOS 64  BILITOT 0.6  PROT 6.1*  ALBUMIN 3.3*   No results for input(s): LIPASE, AMYLASE in the last 168 hours. No  results for input(s): AMMONIA in the last 168 hours. CBC: Recent Labs  Lab 10/07/20 1003 10/07/20 1017  WBC 6.8  --   NEUTROABS 4.2  --   HGB 12.5* 12.2*  HCT 36.5* 36.0*  MCV 85.3  --   PLT 185  --    Cardiac Enzymes: No results for input(s): CKTOTAL, CKMB, CKMBINDEX, TROPONINI in the last 168 hours. BNP: Invalid input(s): POCBNP CBG: Recent Labs  Lab 10/07/20 1631 10/07/20 2246 10/08/20 0803 10/08/20 1145 10/08/20 1549  GLUCAP 254* 265* 202* 231* 253*   D-Dimer No results for input(s): DDIMER in the last 72 hours. Hgb A1c Recent Labs    10/08/20 0431  HGBA1C 9.3*   Lipid Profile Recent Labs    10/08/20 0431  CHOL 69  HDL 22*  LDLCALC 13  TRIG 171*  CHOLHDL 3.1   Thyroid function studies No results for input(s): TSH, T4TOTAL, T3FREE, THYROIDAB in the last 72 hours.  Invalid input(s): FREET3 Anemia work up No results for input(s): VITAMINB12, FOLATE, FERRITIN, TIBC, IRON, RETICCTPCT in the last 72 hours. Urinalysis    Component Value Date/Time   COLORURINE YELLOW 08/14/2019 2032   APPEARANCEUR CLEAR 08/14/2019 2032   LABSPEC 1.017 08/14/2019 2032   PHURINE 5.0 08/14/2019 2032   GLUCOSEU >=500 (A) 08/14/2019 2032   HGBUR NEGATIVE 08/14/2019 2032   BILIRUBINUR NEGATIVE 08/14/2019 2032   [place] NEGATIVE 08/14/2019 2032   PROTEINUR 30 (A) 08/14/2019 2032   NITRITE NEGATIVE 08/14/2019 2032   LEUKOCYTESUR NEGATIVE 08/14/2019 2032   Sepsis Labs Invalid input(s): PROCALCITONIN,  WBC,  LACTICIDVEN Microbiology Recent Results (from the past 240 hour(s))  Resp Panel by RT-PCR (Flu A&B, Covid) Nasopharyngeal Swab     Status: None   Collection Time: 10/07/20 10:45 AM   Specimen: Nasopharyngeal Swab; Nasopharyngeal(NP) swabs in vial transport medium  Result Value Ref Range Status   SARS Coronavirus 2 by RT PCR NEGATIVE NEGATIVE Final    Comment: (NOTE) SARS-CoV-2 target nucleic acids are NOT DETECTED.  The SARS-CoV-2 RNA is generally detectable in  upper respiratory specimens during the acute phase of infection. The lowest concentration of SARS-CoV-2 viral copies this assay can detect is 138 copies/mL. A negative result does not preclude SARS-Cov-2 infection and should not be used as the sole basis for treatment or other patient management decisions. A negative result may occur with  improper specimen collection/handling, submission of specimen other than nasopharyngeal swab, presence of viral mutation(s) within the areas targeted by this assay, and inadequate number of viral copies(<138 copies/mL). A negative result must be combined with clinical observations, patient history, and epidemiological information. The expected result is Negative.  Fact Sheet for Patients:  EntrepreneurPulse.com.au  Fact Sheet for Healthcare Providers:  IncredibleEmployment.be  This test is no t yet approved or cleared by the [place] FDA and  has been authorized for detection and/or diagnosis of SARS-CoV-2 by FDA under an Emergency Use Authorization (EUA). This EUA will remain  in effect (meaning this test can be used) for the duration of the COVID-19 declaration under Section 564(b)(1) of the Act, 21 U.S.C.section 360bbb-3(b)(1), unless the authorization is terminated  or revoked sooner.  Influenza A by PCR NEGATIVE NEGATIVE Final   Influenza B by PCR NEGATIVE NEGATIVE Final    Comment: (NOTE) The Xpert Xpress SARS-CoV-2/FLU/RSV plus assay is intended as an aid in the diagnosis of influenza from Nasopharyngeal swab specimens and should not be used as a sole basis for treatment. Nasal washings and aspirates are unacceptable for Xpert Xpress SARS-CoV-2/FLU/RSV testing.  Fact Sheet for Patients: EntrepreneurPulse.com.au  Fact Sheet for Healthcare Providers: IncredibleEmployment.be  This test is not yet approved or cleared by the [place] FDA and has been  authorized for detection and/or diagnosis of SARS-CoV-2 by FDA under an Emergency Use Authorization (EUA). This EUA will remain in effect (meaning this test can be used) for the duration of the COVID-19 declaration under Section 564(b)(1) of the Act, 21 U.S.C. section 360bbb-3(b)(1), unless the authorization is terminated or revoked.  Performed at [facility], [place] [address] [place], [place] [address]      Time coordinating discharge: Over 30 minutes  SIGNED:   Phillips Climes, MD  [facility] 10/08/2020, 5:29 PM Pager   If 7PM-7AM, please contact night-coverage [email or web address] Password [email or web address]

## 2020-10-08 NOTE — Evaluation (Signed)
Occupational Therapy Evaluation Patient Details Name: Martin Richardson MRN: [id number] DOB: 25-May-1925 Today's Date: 10/08/2020   History of Present Illness Martin Richardson is an 87 y.o. male who presented to the ED with expressive dysphasia (speaking and texting). MRI brain shows small acute stroke at right precentral gyrus and anterior right frontal white matter. 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   Clinical Impression   Prior to the above admission Martin Richardson was mod I with use of AD for mobility, ADLs and most IADLs including driving. He has 3 supportive daughters who assist as needed. Upon evaluation pt was supervision for bed mobility and experienced dizziness with change of position and apparent nystagmus. He has history of BPPV and is currently active with OP PT. Attempted ambulation with a cane, pt had apparent R lateral bias; switched to RW with better balance and reduced fall risk. Pt with slow/deliberate speech and difficulty word finding throughout session, family reported this was not his baseline. Pt would benefit from continued OT acutely to progress back to his mod I base. Recommend d/c to home with 24/7 direct physical assist. He and his daughters are agreeable to this plan contingent on the pt having 24/7 support.      Recommendations for follow up therapy are one component of a multi-disciplinary discharge planning process, led by the attending physician.  Recommendations may be updated based on patient status, additional functional criteria and insurance authorization.   Follow Up Recommendations  Home health OT;Supervision/Assistance - 24 hour    Equipment Recommendations  None recommended by OT (pt has all necessary equipment)       Precautions / Restrictions Precautions Precautions:  Fall Restrictions Weight Bearing Restrictions: No      Mobility Bed Mobility Overal bed mobility: Needs Assistance Bed Mobility: Supine to Sit;Sit to Sidelying     Supine to sit: Supervision;HOB elevated   Sit to sidelying: HOB elevated;Supervision General bed mobility comments: incrased time and effort    Transfers Overall transfer level: Needs assistance Equipment used: Rolling walker (2 wheeled);Straight cane Transfers: Sit to/from Stand Sit to Stand: Min guard         General transfer comment: min guard for sit<>stand with verbal cues for safety    Balance Overall balance assessment: Mild deficits observed, not formally tested;Needs assistance Sitting-balance support: Feet supported Sitting balance-[name]: Good   Postural control: Right lateral lean (during ambulation, pt favored R side and required verbal cues or enviornmental cue to go L) Standing balance support: Bilateral upper extremity supported;Single extremity supported Standing balance-[name]: Fair Standing balance comment: pts standing balance with SPC in r hand was poor, stnading balance with RW was fair                           ADL either performed or assessed with clinical judgement   ADL Overall ADL's : Needs assistance/impaired Eating/Feeding: Independent;Sitting   Grooming: Min guard;Standing   Upper Body Bathing: Set up;Sitting   Lower Body Bathing: Minimal assistance;Sit to/from stand Lower Body Bathing Details (indicate cue type and reason): min A for balance in standing Upper Body Dressing : Set up;Sitting   Lower Body Dressing: Minimal assistance;Sit to/from stand Lower Body Dressing Details (indicate cue type and reason): pt able to don socks and shoes while sitting with incrased time and effort. 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 R eye is [facility]) Patient Visual Report: No change from baseline Vision Assessment?: Vision impaired- to be further tested in functional context Additional Comments: Pt's vision is impaired at baseline, pt also has BPPV wtih associated nystagmus     Perception     Praxis Praxis Praxis tested?: Not tested    Pertinent Vitals/Pain       Hand Dominance Right   Extremity/Trunk Assessment Upper Extremity Assessment Upper Extremity Assessment: Overall WFL for tasks assessed   Lower Extremity Assessment Lower Extremity Assessment: Overall WFL for tasks assessed;Defer to PT evaluation   Cervical / Trunk Assessment Cervical / Trunk Assessment: Kyphotic (mild)   Communication Communication Communication: Expressive difficulties (Mild expressive difficulty, slurred speech, difficulty word finding)   Cognition Arousal/Alertness: Awake/alert Behavior During Therapy: WFL for tasks assessed/performed Overall Cognitive Status: Within Functional Limits for tasks assessed                                     General Comments  VSS throughout, pts 2 daughters were present and supportive throughout the entire session. Daughters verbalized understanding of providing pt wtih 24/7 support at home once d/c.    Exercises     Shoulder Instructions      Home Living Family/patient expects to be discharged to:: Private residence Living Arrangements: Alone Available Help at Discharge: Family;Available 24 hours/day Type of Home: Other(Comment) (ILF) Home Access: Elevator     Home Layout: One level     Bathroom Shower/Tub: Walk-in  shower;Tub/shower unit;Door   [employer] Toilet: Handicapped height Bathroom Accessibility: Yes   Home Equipment: Shower seat - built in;Toilet riser;Walker - 2 wheels;Cane - single point;Wheelchair - Scientist, physiological: Reacher        Prior Functioning/Environment Level of Independence: Independent with assistive device(s)        Comments: Independent household ambulation, uses SPC in R-hand when ambulating outside, drives and indep with ADLs. Daughter assists with some iADLs.        OT Problem List: Decreased strength;Decreased range of motion;Decreased activity tolerance;Impaired balance (sitting and/or standing);Decreased safety awareness;Decreased knowledge of use of DME or AE;Decreased knowledge of precautions;Pain      OT Treatment/Interventions: Self-care/ADL training;Therapeutic exercise;DME and/or AE instruction;Therapeutic activities;Patient/family education;Balance training    OT Goals(Current goals can be found in the care plan section) Acute Rehab OT Goals Patient Stated Goal: home asap OT Goal Formulation: With patient Time For Goal Achievement: 10/22/20 Potential to Achieve Goals: Fair ADL Goals Pt Will Perform Grooming: standing;with modified independence Pt Will Transfer to Toilet: with supervision;ambulating Pt Will Perform Toileting - Clothing Manipulation and hygiene: with modified independence;sitting/lateral leans Additional ADL Goal #1: Pt will independently verbalized at least 3 fall prevention strategies to prepare for safe d/c home.  OT Frequency: Min 2X/week   Barriers to D/C: Decreased caregiver support  pt lives alone. At d/c he can live with one of hsi daughters until 24/7 support is arranged  Co-evaluation              AM-PAC OT "6 Clicks" Daily Activity     Outcome Measure Help from another person eating meals?: None Help from another person taking care of personal grooming?: A Little Help from another  person toileting, which includes using toliet, bedpan, or urinal?: A Little Help from another person bathing (including washing, rinsing, drying)?: A Little Help from another person to put on and taking off regular upper body clothing?: None Help from another person to put on and taking off regular lower body clothing?: A Little 6 Click Score: 20   End of Session Equipment Utilized During Treatment: Gait belt;Rolling walker Nurse Communication: Mobility status;Weight bearing status;Precautions (d/c recommendation)  Activity Tolerance: Patient tolerated treatment well Patient left: in chair;with call bell/phone within reach;with chair alarm set;with family/visitor present  OT Visit Diagnosis: Unsteadiness on feet (R26.81);Other abnormalities of gait and mobility (R26.89);History of falling (Z91.81);Muscle weakness (generalized) (M62.81);Pain;BPPV                Time: 1415-1500 OT Time Calculation (min): 45 min Charges:  OT General Charges $OT Visit: 1 Visit OT Evaluation $OT Eval Moderate Complexity: 1 Mod OT Treatments $Therapeutic Activity: 23-37 mins   Ruchel Brandenburger A Zacharius Funari 10/08/2020, 3:54 PM

## 2020-10-08 NOTE — Evaluation (Signed)
Physical Therapy Evaluation Patient Details Name: Martin Richardson MRN: [id number] DOB: 24-Nov-1925 Today's Date: 10/08/2020  History of Present Illness  Martin Richardson is an 87 y.o. male who presented to the ED on 10/07/20 with expressive dysphasia (speaking and texting). MRI brain shows small acute stroke at right precentral gyrus and anterior right frontal white matter. 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  If pt is here tomorrow, he would benefit from gaze stability exercises seated both vertical and horizontal.  PT to follow acutely for deficits listed below.        Recommendations for follow up therapy are one component of a multi-disciplinary discharge planning process, led by the attending physician.  Recommendations may be updated based on patient status, additional functional criteria and insurance authorization.  Follow Up Recommendations Home health PT;Other (comment) (request vestibular therapist if possible.)    Equipment Recommendations  None recommended by PT    Recommendations for Other Services       Precautions / Restrictions Precautions Precautions: Fall Restrictions Weight Bearing Restrictions: No      Mobility  Bed Mobility Overal bed mobility: Needs Assistance Bed Mobility: Supine to Sit;Sit to Sidelying     Supine to sit: Supervision;HOB elevated   Sit to sidelying: HOB elevated;Supervision General bed mobility comments: incrased time and effort    Transfers Overall transfer level: Needs assistance Equipment used: 1 person hand held assist Transfers: Sit to/from [facility] Sit to Stand: Min assist Stand pivot  transfers: Min assist       General transfer comment: Min assist without assistive device to reposition in bed to check canals.  Ambulation/Gait                Stairs            Wheelchair Mobility    Modified Rankin (Stroke Patients Only)       Balance Overall balance assessment: Mild deficits observed, not formally tested;Needs assistance Sitting-balance support: Feet supported Sitting balance-[name]: Good   Postural control: Right lateral lean (during ambulation, pt favored R side and required verbal cues or enviornmental cue to go L) Standing balance support: Bilateral upper extremity supported;Single extremity supported Standing balance-[name]: Fair Standing balance comment: Static standing close supervision with bil LEs resting against chair.        10/08/20 1622  Vestibular Assessment  General Observation wears trifocals  Symptom Behavior  Subjective history of current problem has cataracts L eye, L eye is legally blind, h/o vertigo treated at OP PT before, describes the Epley's, (+) recent fall with head trauma, spinning sensation and underlying sense of imbalance, MRI showed new and old strokes, old in cerebellum  Type of Dizziness  Imbalance;Spinning  Frequency of Dizziness with position changes, head movement  Duration of Dizziness short  Symptom Nature Motion provoked;Positional  Aggravating Factors Turning body quickly;Turning head quickly;Supine to sit;Sit to stand  Relieving Factors Closing eyes;Rest;Slow movements  Progression of Symptoms Better  History of similar episodes yes  Oculomotor Exam  Oculomotor Alignment Normal  Ocular ROM WNL  Spontaneous Absent  Gaze-induced  Absent  Smooth Pursuits Saccades  Saccades Poor trajectory  Oculomotor Exam-Fixation Suppressed   Left Head Impulse too guarded  Right  Head Impulse too guarded  Vestibulo-Ocular Reflex  VOR 1 Head Only (x 1 viewing) difficulty with both vertical and  horizontal gaze stability testing.  VOR Cancellation Normal  Auditory  Comments grossly equal  Positional Testing  Dix-Hallpike Dix-Hallpike Right  Sidelying Test Sidelying Right  Dix-Hallpike Right  Dix-Hallpike Right Duration 8  Dix-Hallpike Right Symptoms Upbeat, right rotatory nystagmus  Sidelying Right  Sidelying Right Duration 10  Sidelying Right Symptoms Upbeat, right rotatory nystagmus                         Pertinent Vitals/Pain Pain Assessment: No/denies pain    Home Living Family/patient expects to be discharged to:: Private residence Living Arrangements: Alone Available Help at Discharge: Family;Available 24 hours/day Type of Home: Other(Comment) (ILF) Home Access: Elevator     Home Layout: One level Home Equipment: Shower seat - built in;Toilet riser;Walker - 2 wheels;Cane - single point;Wheelchair - Psychologist, educational      Prior Function Level of Independence: Independent with assistive device(s)         Comments: Independent household ambulation, uses SPC in R-hand when ambulating outside, drives and indep with ADLs. Daughter assists with some iADLs.     Hand Dominance   Dominant Hand: Right    Extremity/Trunk Assessment   Upper Extremity Assessment Upper Extremity Assessment: Defer to OT evaluation    Lower Extremity Assessment Lower Extremity Assessment: Generalized weakness    Cervical / Trunk Assessment Cervical / Trunk Assessment: Kyphotic (mild)  Communication   Communication: Expressive difficulties (Mild expressive difficulty, slurred speech, difficulty word finding)  Cognition Arousal/Alertness: Awake/alert Behavior During Therapy: WFL for tasks assessed/performed Overall Cognitive Status: Within Functional Limits for tasks assessed                                        General Comments General comments (skin integrity, edema, etc.): VSS, HR low, but that seems to be his normal in the 50s     Exercises     Assessment/Plan    PT Assessment Patient needs continued PT services  PT Problem List Decreased strength;Decreased activity tolerance;Decreased balance;Decreased mobility;Decreased knowledge of use of DME       PT Treatment Interventions DME instruction;Gait training;Stair training;Functional mobility training;Therapeutic activities;Therapeutic exercise;Balance training;Patient/family education    PT Goals (Current goals can be found in the Care Plan section)  Acute Rehab PT Goals Patient Stated Goal: home asap PT Goal Formulation: With patient/family Time For Goal Achievement: 10/22/20 Potential to Achieve Goals: Good    Frequency Min 4X/week   Barriers to discharge        Co-evaluation               AM-PAC PT "6 Clicks" Mobility  Outcome Measure Help needed turning from your back to your side while in a flat bed without using bedrails?: A Little Help needed moving from lying on your back to sitting on the side of a flat bed without using bedrails?: A Little Help needed moving to and from a bed to a chair (including a wheelchair)?: A Little Help needed standing up from a chair using your arms (e.g., wheelchair or bedside chair)?: A Little Help needed to walk in hospital room?: A Little Help needed climbing 3-5 steps with a railing? : A Little 6 Click Score: 18    End of Session   Activity Tolerance: Patient tolerated  treatment well Patient left: in bed;with call bell/phone within reach;with family/visitor present;with bed alarm set   PT Visit Diagnosis: Muscle weakness (generalized) (M62.81);Difficulty in walking, not elsewhere classified (R26.2);BPPV BPPV - Right/Left : Right    Time: 1513-1610 PT Time Calculation (min) (ACUTE ONLY): 57 min   Charges:   PT Evaluation $PT Eval Moderate Complexity: 1 Mod PT Treatments $Therapeutic Activity: 23-37 mins $Canalith Rep Proc: 8-22 mins        Verdene Lennert, PT, DPT  Acute Rehabilitation Ortho  Tech Supervisor [phone] pager [phone]) [phone] office

## 2020-10-11 DIAGNOSIS — I129 Hypertensive chronic kidney disease with stage 1 through stage 4 chronic kidney disease, or unspecified chronic kidney disease: Secondary | ICD-10-CM | POA: Diagnosis not present

## 2020-10-11 DIAGNOSIS — Z23 Encounter for immunization: Secondary | ICD-10-CM | POA: Diagnosis not present

## 2020-10-11 DIAGNOSIS — N2581 Secondary hyperparathyroidism of renal origin: Secondary | ICD-10-CM | POA: Diagnosis not present

## 2020-10-11 DIAGNOSIS — I639 Cerebral infarction, unspecified: Secondary | ICD-10-CM | POA: Diagnosis not present

## 2020-10-11 DIAGNOSIS — D631 Anemia in chronic kidney disease: Secondary | ICD-10-CM | POA: Diagnosis not present

## 2020-10-11 DIAGNOSIS — C642 Malignant neoplasm of left kidney, except renal pelvis: Secondary | ICD-10-CM | POA: Diagnosis not present

## 2020-10-11 DIAGNOSIS — N184 Chronic kidney disease, stage 4 (severe): Secondary | ICD-10-CM | POA: Diagnosis not present

## 2020-10-12 DIAGNOSIS — I129 Hypertensive chronic kidney disease with stage 1 through stage 4 chronic kidney disease, or unspecified chronic kidney disease: Secondary | ICD-10-CM | POA: Diagnosis not present

## 2020-10-12 DIAGNOSIS — E1140 Type 2 diabetes mellitus with diabetic neuropathy, unspecified: Secondary | ICD-10-CM | POA: Diagnosis not present

## 2020-10-12 DIAGNOSIS — E785 Hyperlipidemia, unspecified: Secondary | ICD-10-CM | POA: Diagnosis not present

## 2020-10-12 DIAGNOSIS — H548 Legal blindness, as defined in USA: Secondary | ICD-10-CM | POA: Diagnosis not present

## 2020-10-12 DIAGNOSIS — E1122 Type 2 diabetes mellitus with diabetic chronic kidney disease: Secondary | ICD-10-CM | POA: Diagnosis not present

## 2020-10-12 DIAGNOSIS — Z7982 Long term (current) use of aspirin: Secondary | ICD-10-CM | POA: Diagnosis not present

## 2020-10-12 DIAGNOSIS — Z87891 Personal history of nicotine dependence: Secondary | ICD-10-CM | POA: Diagnosis not present

## 2020-10-12 DIAGNOSIS — N184 Chronic kidney disease, stage 4 (severe): Secondary | ICD-10-CM | POA: Diagnosis not present

## 2020-10-12 DIAGNOSIS — E039 Hypothyroidism, unspecified: Secondary | ICD-10-CM | POA: Diagnosis not present

## 2020-10-12 DIAGNOSIS — Z9181 History of falling: Secondary | ICD-10-CM | POA: Diagnosis not present

## 2020-10-12 DIAGNOSIS — Z794 Long term (current) use of insulin: Secondary | ICD-10-CM | POA: Diagnosis not present

## 2020-10-12 DIAGNOSIS — I2510 Atherosclerotic heart disease of native coronary artery without angina pectoris: Secondary | ICD-10-CM | POA: Diagnosis not present

## 2020-10-12 DIAGNOSIS — I69320 Aphasia following cerebral infarction: Secondary | ICD-10-CM | POA: Diagnosis not present

## 2020-10-15 DIAGNOSIS — D649 Anemia, unspecified: Secondary | ICD-10-CM | POA: Diagnosis not present

## 2020-10-15 DIAGNOSIS — E1122 Type 2 diabetes mellitus with diabetic chronic kidney disease: Secondary | ICD-10-CM | POA: Diagnosis not present

## 2020-10-15 DIAGNOSIS — K219 Gastro-esophageal reflux disease without esophagitis: Secondary | ICD-10-CM | POA: Diagnosis not present

## 2020-10-15 DIAGNOSIS — E039 Hypothyroidism, unspecified: Secondary | ICD-10-CM | POA: Diagnosis not present

## 2020-10-15 DIAGNOSIS — I129 Hypertensive chronic kidney disease with stage 1 through stage 4 chronic kidney disease, or unspecified chronic kidney disease: Secondary | ICD-10-CM | POA: Diagnosis not present

## 2020-10-15 DIAGNOSIS — I69320 Aphasia following cerebral infarction: Secondary | ICD-10-CM | POA: Diagnosis not present

## 2020-10-15 DIAGNOSIS — H548 Legal blindness, as defined in USA: Secondary | ICD-10-CM | POA: Diagnosis not present

## 2020-10-15 DIAGNOSIS — I10 Essential (primary) hypertension: Secondary | ICD-10-CM | POA: Diagnosis not present

## 2020-10-15 DIAGNOSIS — E1169 Type 2 diabetes mellitus with other specified complication: Secondary | ICD-10-CM | POA: Diagnosis not present

## 2020-10-15 DIAGNOSIS — Z794 Long term (current) use of insulin: Secondary | ICD-10-CM | POA: Diagnosis not present

## 2020-10-15 DIAGNOSIS — E785 Hyperlipidemia, unspecified: Secondary | ICD-10-CM | POA: Diagnosis not present

## 2020-10-15 DIAGNOSIS — N1832 Chronic kidney disease, stage 3b: Secondary | ICD-10-CM | POA: Diagnosis not present

## 2020-10-15 DIAGNOSIS — I2510 Atherosclerotic heart disease of native coronary artery without angina pectoris: Secondary | ICD-10-CM | POA: Diagnosis not present

## 2020-10-15 DIAGNOSIS — N184 Chronic kidney disease, stage 4 (severe): Secondary | ICD-10-CM | POA: Diagnosis not present

## 2020-10-15 DIAGNOSIS — L821 Other seborrheic keratosis: Secondary | ICD-10-CM | POA: Diagnosis not present

## 2020-10-15 DIAGNOSIS — G4700 Insomnia, unspecified: Secondary | ICD-10-CM | POA: Diagnosis not present

## 2020-10-15 DIAGNOSIS — N2581 Secondary hyperparathyroidism of renal origin: Secondary | ICD-10-CM | POA: Diagnosis not present

## 2020-10-15 DIAGNOSIS — Z87891 Personal history of nicotine dependence: Secondary | ICD-10-CM | POA: Diagnosis not present

## 2020-10-15 DIAGNOSIS — Z7982 Long term (current) use of aspirin: Secondary | ICD-10-CM | POA: Diagnosis not present

## 2020-10-15 DIAGNOSIS — L570 Actinic keratosis: Secondary | ICD-10-CM | POA: Diagnosis not present

## 2020-10-15 DIAGNOSIS — D1801 Hemangioma of skin and subcutaneous tissue: Secondary | ICD-10-CM | POA: Diagnosis not present

## 2020-10-15 DIAGNOSIS — E1140 Type 2 diabetes mellitus with diabetic neuropathy, unspecified: Secondary | ICD-10-CM | POA: Diagnosis not present

## 2020-10-15 DIAGNOSIS — Z9181 History of falling: Secondary | ICD-10-CM | POA: Diagnosis not present

## 2020-10-17 DIAGNOSIS — E1122 Type 2 diabetes mellitus with diabetic chronic kidney disease: Secondary | ICD-10-CM | POA: Diagnosis not present

## 2020-10-17 DIAGNOSIS — N184 Chronic kidney disease, stage 4 (severe): Secondary | ICD-10-CM | POA: Diagnosis not present

## 2020-10-17 DIAGNOSIS — I69320 Aphasia following cerebral infarction: Secondary | ICD-10-CM | POA: Diagnosis not present

## 2020-10-17 DIAGNOSIS — Z7982 Long term (current) use of aspirin: Secondary | ICD-10-CM | POA: Diagnosis not present

## 2020-10-17 DIAGNOSIS — R42 Dizziness and giddiness: Secondary | ICD-10-CM | POA: Diagnosis not present

## 2020-10-17 DIAGNOSIS — H548 Legal blindness, as defined in USA: Secondary | ICD-10-CM | POA: Diagnosis not present

## 2020-10-17 DIAGNOSIS — E785 Hyperlipidemia, unspecified: Secondary | ICD-10-CM | POA: Diagnosis not present

## 2020-10-17 DIAGNOSIS — E039 Hypothyroidism, unspecified: Secondary | ICD-10-CM | POA: Diagnosis not present

## 2020-10-17 DIAGNOSIS — E1169 Type 2 diabetes mellitus with other specified complication: Secondary | ICD-10-CM | POA: Diagnosis not present

## 2020-10-17 DIAGNOSIS — I2510 Atherosclerotic heart disease of native coronary artery without angina pectoris: Secondary | ICD-10-CM | POA: Diagnosis not present

## 2020-10-17 DIAGNOSIS — I10 Essential (primary) hypertension: Secondary | ICD-10-CM | POA: Diagnosis not present

## 2020-10-17 DIAGNOSIS — Z794 Long term (current) use of insulin: Secondary | ICD-10-CM | POA: Diagnosis not present

## 2020-10-17 DIAGNOSIS — N1832 Chronic kidney disease, stage 3b: Secondary | ICD-10-CM | POA: Diagnosis not present

## 2020-10-17 DIAGNOSIS — Z9181 History of falling: Secondary | ICD-10-CM | POA: Diagnosis not present

## 2020-10-17 DIAGNOSIS — N2581 Secondary hyperparathyroidism of renal origin: Secondary | ICD-10-CM | POA: Diagnosis not present

## 2020-10-17 DIAGNOSIS — I129 Hypertensive chronic kidney disease with stage 1 through stage 4 chronic kidney disease, or unspecified chronic kidney disease: Secondary | ICD-10-CM | POA: Diagnosis not present

## 2020-10-17 DIAGNOSIS — Z8616 Personal history of COVID-19: Secondary | ICD-10-CM | POA: Diagnosis not present

## 2020-10-17 DIAGNOSIS — Z87891 Personal history of nicotine dependence: Secondary | ICD-10-CM | POA: Diagnosis not present

## 2020-10-17 DIAGNOSIS — K219 Gastro-esophageal reflux disease without esophagitis: Secondary | ICD-10-CM | POA: Diagnosis not present

## 2020-10-17 DIAGNOSIS — E1140 Type 2 diabetes mellitus with diabetic neuropathy, unspecified: Secondary | ICD-10-CM | POA: Diagnosis not present

## 2020-10-18 ENCOUNTER — Telehealth: Payer: Self-pay | Admitting: Interventional Cardiology

## 2020-10-18 DIAGNOSIS — I2510 Atherosclerotic heart disease of native coronary artery without angina pectoris: Secondary | ICD-10-CM | POA: Diagnosis not present

## 2020-10-18 DIAGNOSIS — H548 Legal blindness, as defined in USA: Secondary | ICD-10-CM | POA: Diagnosis not present

## 2020-10-18 DIAGNOSIS — E1140 Type 2 diabetes mellitus with diabetic neuropathy, unspecified: Secondary | ICD-10-CM | POA: Diagnosis not present

## 2020-10-18 DIAGNOSIS — E039 Hypothyroidism, unspecified: Secondary | ICD-10-CM | POA: Diagnosis not present

## 2020-10-18 DIAGNOSIS — Z7982 Long term (current) use of aspirin: Secondary | ICD-10-CM | POA: Diagnosis not present

## 2020-10-18 DIAGNOSIS — Z9181 History of falling: Secondary | ICD-10-CM | POA: Diagnosis not present

## 2020-10-18 DIAGNOSIS — I69320 Aphasia following cerebral infarction: Secondary | ICD-10-CM | POA: Diagnosis not present

## 2020-10-18 DIAGNOSIS — E1122 Type 2 diabetes mellitus with diabetic chronic kidney disease: Secondary | ICD-10-CM | POA: Diagnosis not present

## 2020-10-18 DIAGNOSIS — I129 Hypertensive chronic kidney disease with stage 1 through stage 4 chronic kidney disease, or unspecified chronic kidney disease: Secondary | ICD-10-CM | POA: Diagnosis not present

## 2020-10-18 DIAGNOSIS — N184 Chronic kidney disease, stage 4 (severe): Secondary | ICD-10-CM | POA: Diagnosis not present

## 2020-10-18 DIAGNOSIS — Z794 Long term (current) use of insulin: Secondary | ICD-10-CM | POA: Diagnosis not present

## 2020-10-18 DIAGNOSIS — Z87891 Personal history of nicotine dependence: Secondary | ICD-10-CM | POA: Diagnosis not present

## 2020-10-18 DIAGNOSIS — E785 Hyperlipidemia, unspecified: Secondary | ICD-10-CM | POA: Diagnosis not present

## 2020-10-18 NOTE — Telephone Encounter (Signed)
STAT if HR is under 50 or over 120 (normal HR is 60-100 beats per minute)  What is your heart rate? 48 at rest and 55 after while standing  Do you have a log of your heart rate readings (document readings)?   Do you have any other symptoms? Frail more so than normal, dosed off 2 times while Home Nurse was there

## 2020-10-18 NOTE — Telephone Encounter (Signed)
Call sent straight to triage. Don OP with [facility] stated patient's HR was 48 while sitting. Here are previous readings.   Last night BP 158/78  HR 51 This AM   BP  114/62  HR 58 This afternoon sitting BP 124/60 HR 48 This afternoon standing BP 130/70  HR 55  Patient is asymptomatic and feeling fine. Per Timmothy Sours, Patient stated it is his nap time. Patient wanted Dr. [name] to know he was in the hospital for stroke. Will forward to Dr. [name] for advisement.   Patient had monitor on over the summer and here are the results.   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 As long as he remains asymptomatic, no therapy required. Please inform us if heart racing or feelings of fainting or near fainting. A copy will be sent to Kristen Loader, FNP

## 2020-10-19 DIAGNOSIS — H548 Legal blindness, as defined in USA: Secondary | ICD-10-CM | POA: Diagnosis not present

## 2020-10-19 DIAGNOSIS — Z87891 Personal history of nicotine dependence: Secondary | ICD-10-CM | POA: Diagnosis not present

## 2020-10-19 DIAGNOSIS — E1122 Type 2 diabetes mellitus with diabetic chronic kidney disease: Secondary | ICD-10-CM | POA: Diagnosis not present

## 2020-10-19 DIAGNOSIS — N184 Chronic kidney disease, stage 4 (severe): Secondary | ICD-10-CM | POA: Diagnosis not present

## 2020-10-19 DIAGNOSIS — I69320 Aphasia following cerebral infarction: Secondary | ICD-10-CM | POA: Diagnosis not present

## 2020-10-19 DIAGNOSIS — Z794 Long term (current) use of insulin: Secondary | ICD-10-CM | POA: Diagnosis not present

## 2020-10-19 DIAGNOSIS — Z9181 History of falling: Secondary | ICD-10-CM | POA: Diagnosis not present

## 2020-10-19 DIAGNOSIS — E1140 Type 2 diabetes mellitus with diabetic neuropathy, unspecified: Secondary | ICD-10-CM | POA: Diagnosis not present

## 2020-10-19 DIAGNOSIS — I129 Hypertensive chronic kidney disease with stage 1 through stage 4 chronic kidney disease, or unspecified chronic kidney disease: Secondary | ICD-10-CM | POA: Diagnosis not present

## 2020-10-19 DIAGNOSIS — E785 Hyperlipidemia, unspecified: Secondary | ICD-10-CM | POA: Diagnosis not present

## 2020-10-19 DIAGNOSIS — Z7982 Long term (current) use of aspirin: Secondary | ICD-10-CM | POA: Diagnosis not present

## 2020-10-19 DIAGNOSIS — E039 Hypothyroidism, unspecified: Secondary | ICD-10-CM | POA: Diagnosis not present

## 2020-10-19 DIAGNOSIS — I2510 Atherosclerotic heart disease of native coronary artery without angina pectoris: Secondary | ICD-10-CM | POA: Diagnosis not present

## 2020-10-23 DIAGNOSIS — Z7982 Long term (current) use of aspirin: Secondary | ICD-10-CM | POA: Diagnosis not present

## 2020-10-23 DIAGNOSIS — I129 Hypertensive chronic kidney disease with stage 1 through stage 4 chronic kidney disease, or unspecified chronic kidney disease: Secondary | ICD-10-CM | POA: Diagnosis not present

## 2020-10-23 DIAGNOSIS — E785 Hyperlipidemia, unspecified: Secondary | ICD-10-CM | POA: Diagnosis not present

## 2020-10-23 DIAGNOSIS — H548 Legal blindness, as defined in USA: Secondary | ICD-10-CM | POA: Diagnosis not present

## 2020-10-23 DIAGNOSIS — N184 Chronic kidney disease, stage 4 (severe): Secondary | ICD-10-CM | POA: Diagnosis not present

## 2020-10-23 DIAGNOSIS — I2510 Atherosclerotic heart disease of native coronary artery without angina pectoris: Secondary | ICD-10-CM | POA: Diagnosis not present

## 2020-10-23 DIAGNOSIS — E1122 Type 2 diabetes mellitus with diabetic chronic kidney disease: Secondary | ICD-10-CM | POA: Diagnosis not present

## 2020-10-23 DIAGNOSIS — I69320 Aphasia following cerebral infarction: Secondary | ICD-10-CM | POA: Diagnosis not present

## 2020-10-23 DIAGNOSIS — Z794 Long term (current) use of insulin: Secondary | ICD-10-CM | POA: Diagnosis not present

## 2020-10-23 DIAGNOSIS — E039 Hypothyroidism, unspecified: Secondary | ICD-10-CM | POA: Diagnosis not present

## 2020-10-23 DIAGNOSIS — Z87891 Personal history of nicotine dependence: Secondary | ICD-10-CM | POA: Diagnosis not present

## 2020-10-23 DIAGNOSIS — E1140 Type 2 diabetes mellitus with diabetic neuropathy, unspecified: Secondary | ICD-10-CM | POA: Diagnosis not present

## 2020-10-23 DIAGNOSIS — Z9181 History of falling: Secondary | ICD-10-CM | POA: Diagnosis not present

## 2020-10-24 DIAGNOSIS — E1122 Type 2 diabetes mellitus with diabetic chronic kidney disease: Secondary | ICD-10-CM | POA: Diagnosis not present

## 2020-10-24 DIAGNOSIS — I129 Hypertensive chronic kidney disease with stage 1 through stage 4 chronic kidney disease, or unspecified chronic kidney disease: Secondary | ICD-10-CM | POA: Diagnosis not present

## 2020-10-24 DIAGNOSIS — Z794 Long term (current) use of insulin: Secondary | ICD-10-CM | POA: Diagnosis not present

## 2020-10-24 DIAGNOSIS — Z7982 Long term (current) use of aspirin: Secondary | ICD-10-CM | POA: Diagnosis not present

## 2020-10-24 DIAGNOSIS — E039 Hypothyroidism, unspecified: Secondary | ICD-10-CM | POA: Diagnosis not present

## 2020-10-24 DIAGNOSIS — N184 Chronic kidney disease, stage 4 (severe): Secondary | ICD-10-CM | POA: Diagnosis not present

## 2020-10-24 DIAGNOSIS — Z9181 History of falling: Secondary | ICD-10-CM | POA: Diagnosis not present

## 2020-10-24 DIAGNOSIS — Z87891 Personal history of nicotine dependence: Secondary | ICD-10-CM | POA: Diagnosis not present

## 2020-10-24 DIAGNOSIS — E785 Hyperlipidemia, unspecified: Secondary | ICD-10-CM | POA: Diagnosis not present

## 2020-10-24 DIAGNOSIS — H548 Legal blindness, as defined in USA: Secondary | ICD-10-CM | POA: Diagnosis not present

## 2020-10-24 DIAGNOSIS — I69320 Aphasia following cerebral infarction: Secondary | ICD-10-CM | POA: Diagnosis not present

## 2020-10-24 DIAGNOSIS — I2510 Atherosclerotic heart disease of native coronary artery without angina pectoris: Secondary | ICD-10-CM | POA: Diagnosis not present

## 2020-10-24 DIAGNOSIS — E1140 Type 2 diabetes mellitus with diabetic neuropathy, unspecified: Secondary | ICD-10-CM | POA: Diagnosis not present

## 2020-10-24 NOTE — Telephone Encounter (Signed)
No change in therapy as long as he feels okay. Not on therapy that slows HR.

## 2020-10-25 DIAGNOSIS — Z9181 History of falling: Secondary | ICD-10-CM | POA: Diagnosis not present

## 2020-10-25 DIAGNOSIS — E785 Hyperlipidemia, unspecified: Secondary | ICD-10-CM | POA: Diagnosis not present

## 2020-10-25 DIAGNOSIS — N184 Chronic kidney disease, stage 4 (severe): Secondary | ICD-10-CM | POA: Diagnosis not present

## 2020-10-25 DIAGNOSIS — Z794 Long term (current) use of insulin: Secondary | ICD-10-CM | POA: Diagnosis not present

## 2020-10-25 DIAGNOSIS — Z87891 Personal history of nicotine dependence: Secondary | ICD-10-CM | POA: Diagnosis not present

## 2020-10-25 DIAGNOSIS — E039 Hypothyroidism, unspecified: Secondary | ICD-10-CM | POA: Diagnosis not present

## 2020-10-25 DIAGNOSIS — I2510 Atherosclerotic heart disease of native coronary artery without angina pectoris: Secondary | ICD-10-CM | POA: Diagnosis not present

## 2020-10-25 DIAGNOSIS — I129 Hypertensive chronic kidney disease with stage 1 through stage 4 chronic kidney disease, or unspecified chronic kidney disease: Secondary | ICD-10-CM | POA: Diagnosis not present

## 2020-10-25 DIAGNOSIS — H548 Legal blindness, as defined in USA: Secondary | ICD-10-CM | POA: Diagnosis not present

## 2020-10-25 DIAGNOSIS — E1140 Type 2 diabetes mellitus with diabetic neuropathy, unspecified: Secondary | ICD-10-CM | POA: Diagnosis not present

## 2020-10-25 DIAGNOSIS — Z7982 Long term (current) use of aspirin: Secondary | ICD-10-CM | POA: Diagnosis not present

## 2020-10-25 DIAGNOSIS — I69320 Aphasia following cerebral infarction: Secondary | ICD-10-CM | POA: Diagnosis not present

## 2020-10-25 DIAGNOSIS — E1122 Type 2 diabetes mellitus with diabetic chronic kidney disease: Secondary | ICD-10-CM | POA: Diagnosis not present

## 2020-10-25 NOTE — Telephone Encounter (Signed)
Spoke with pt and he denies symptoms.  States Martin Richardson has been back a couple of times since the last call and HR has been fine.  Advised pt to call if he develops symptoms.  Pt appreciative for call.

## 2020-10-26 DIAGNOSIS — E039 Hypothyroidism, unspecified: Secondary | ICD-10-CM | POA: Diagnosis not present

## 2020-10-26 DIAGNOSIS — E785 Hyperlipidemia, unspecified: Secondary | ICD-10-CM | POA: Diagnosis not present

## 2020-10-26 DIAGNOSIS — Z87891 Personal history of nicotine dependence: Secondary | ICD-10-CM | POA: Diagnosis not present

## 2020-10-26 DIAGNOSIS — H548 Legal blindness, as defined in USA: Secondary | ICD-10-CM | POA: Diagnosis not present

## 2020-10-26 DIAGNOSIS — I129 Hypertensive chronic kidney disease with stage 1 through stage 4 chronic kidney disease, or unspecified chronic kidney disease: Secondary | ICD-10-CM | POA: Diagnosis not present

## 2020-10-26 DIAGNOSIS — Z7982 Long term (current) use of aspirin: Secondary | ICD-10-CM | POA: Diagnosis not present

## 2020-10-26 DIAGNOSIS — I69320 Aphasia following cerebral infarction: Secondary | ICD-10-CM | POA: Diagnosis not present

## 2020-10-26 DIAGNOSIS — Z9181 History of falling: Secondary | ICD-10-CM | POA: Diagnosis not present

## 2020-10-26 DIAGNOSIS — I2510 Atherosclerotic heart disease of native coronary artery without angina pectoris: Secondary | ICD-10-CM | POA: Diagnosis not present

## 2020-10-26 DIAGNOSIS — E1140 Type 2 diabetes mellitus with diabetic neuropathy, unspecified: Secondary | ICD-10-CM | POA: Diagnosis not present

## 2020-10-26 DIAGNOSIS — N184 Chronic kidney disease, stage 4 (severe): Secondary | ICD-10-CM | POA: Diagnosis not present

## 2020-10-26 DIAGNOSIS — E1122 Type 2 diabetes mellitus with diabetic chronic kidney disease: Secondary | ICD-10-CM | POA: Diagnosis not present

## 2020-10-26 DIAGNOSIS — Z794 Long term (current) use of insulin: Secondary | ICD-10-CM | POA: Diagnosis not present

## 2020-10-29 DIAGNOSIS — Z7982 Long term (current) use of aspirin: Secondary | ICD-10-CM | POA: Diagnosis not present

## 2020-10-29 DIAGNOSIS — N184 Chronic kidney disease, stage 4 (severe): Secondary | ICD-10-CM | POA: Diagnosis not present

## 2020-10-29 DIAGNOSIS — Z87891 Personal history of nicotine dependence: Secondary | ICD-10-CM | POA: Diagnosis not present

## 2020-10-29 DIAGNOSIS — E785 Hyperlipidemia, unspecified: Secondary | ICD-10-CM | POA: Diagnosis not present

## 2020-10-29 DIAGNOSIS — E1140 Type 2 diabetes mellitus with diabetic neuropathy, unspecified: Secondary | ICD-10-CM | POA: Diagnosis not present

## 2020-10-29 DIAGNOSIS — I129 Hypertensive chronic kidney disease with stage 1 through stage 4 chronic kidney disease, or unspecified chronic kidney disease: Secondary | ICD-10-CM | POA: Diagnosis not present

## 2020-10-29 DIAGNOSIS — E1122 Type 2 diabetes mellitus with diabetic chronic kidney disease: Secondary | ICD-10-CM | POA: Diagnosis not present

## 2020-10-29 DIAGNOSIS — E039 Hypothyroidism, unspecified: Secondary | ICD-10-CM | POA: Diagnosis not present

## 2020-10-29 DIAGNOSIS — I69320 Aphasia following cerebral infarction: Secondary | ICD-10-CM | POA: Diagnosis not present

## 2020-10-29 DIAGNOSIS — I2510 Atherosclerotic heart disease of native coronary artery without angina pectoris: Secondary | ICD-10-CM | POA: Diagnosis not present

## 2020-10-29 DIAGNOSIS — Z794 Long term (current) use of insulin: Secondary | ICD-10-CM | POA: Diagnosis not present

## 2020-10-29 DIAGNOSIS — Z9181 History of falling: Secondary | ICD-10-CM | POA: Diagnosis not present

## 2020-10-29 DIAGNOSIS — H548 Legal blindness, as defined in USA: Secondary | ICD-10-CM | POA: Diagnosis not present

## 2020-10-31 DIAGNOSIS — Z7982 Long term (current) use of aspirin: Secondary | ICD-10-CM | POA: Diagnosis not present

## 2020-10-31 DIAGNOSIS — E785 Hyperlipidemia, unspecified: Secondary | ICD-10-CM | POA: Diagnosis not present

## 2020-10-31 DIAGNOSIS — Z794 Long term (current) use of insulin: Secondary | ICD-10-CM | POA: Diagnosis not present

## 2020-10-31 DIAGNOSIS — I69320 Aphasia following cerebral infarction: Secondary | ICD-10-CM | POA: Diagnosis not present

## 2020-10-31 DIAGNOSIS — E039 Hypothyroidism, unspecified: Secondary | ICD-10-CM | POA: Diagnosis not present

## 2020-10-31 DIAGNOSIS — I129 Hypertensive chronic kidney disease with stage 1 through stage 4 chronic kidney disease, or unspecified chronic kidney disease: Secondary | ICD-10-CM | POA: Diagnosis not present

## 2020-10-31 DIAGNOSIS — E1140 Type 2 diabetes mellitus with diabetic neuropathy, unspecified: Secondary | ICD-10-CM | POA: Diagnosis not present

## 2020-10-31 DIAGNOSIS — E1122 Type 2 diabetes mellitus with diabetic chronic kidney disease: Secondary | ICD-10-CM | POA: Diagnosis not present

## 2020-10-31 DIAGNOSIS — Z9181 History of falling: Secondary | ICD-10-CM | POA: Diagnosis not present

## 2020-10-31 DIAGNOSIS — H548 Legal blindness, as defined in USA: Secondary | ICD-10-CM | POA: Diagnosis not present

## 2020-10-31 DIAGNOSIS — N184 Chronic kidney disease, stage 4 (severe): Secondary | ICD-10-CM | POA: Diagnosis not present

## 2020-10-31 DIAGNOSIS — Z87891 Personal history of nicotine dependence: Secondary | ICD-10-CM | POA: Diagnosis not present

## 2020-10-31 DIAGNOSIS — I2510 Atherosclerotic heart disease of native coronary artery without angina pectoris: Secondary | ICD-10-CM | POA: Diagnosis not present

## 2020-11-05 DIAGNOSIS — E1122 Type 2 diabetes mellitus with diabetic chronic kidney disease: Secondary | ICD-10-CM | POA: Diagnosis not present

## 2020-11-05 DIAGNOSIS — N184 Chronic kidney disease, stage 4 (severe): Secondary | ICD-10-CM | POA: Diagnosis not present

## 2020-11-05 DIAGNOSIS — Z87891 Personal history of nicotine dependence: Secondary | ICD-10-CM | POA: Diagnosis not present

## 2020-11-05 DIAGNOSIS — I69320 Aphasia following cerebral infarction: Secondary | ICD-10-CM | POA: Diagnosis not present

## 2020-11-05 DIAGNOSIS — I129 Hypertensive chronic kidney disease with stage 1 through stage 4 chronic kidney disease, or unspecified chronic kidney disease: Secondary | ICD-10-CM | POA: Diagnosis not present

## 2020-11-05 DIAGNOSIS — Z794 Long term (current) use of insulin: Secondary | ICD-10-CM | POA: Diagnosis not present

## 2020-11-05 DIAGNOSIS — E039 Hypothyroidism, unspecified: Secondary | ICD-10-CM | POA: Diagnosis not present

## 2020-11-05 DIAGNOSIS — Z9181 History of falling: Secondary | ICD-10-CM | POA: Diagnosis not present

## 2020-11-05 DIAGNOSIS — H548 Legal blindness, as defined in USA: Secondary | ICD-10-CM | POA: Diagnosis not present

## 2020-11-05 DIAGNOSIS — E1140 Type 2 diabetes mellitus with diabetic neuropathy, unspecified: Secondary | ICD-10-CM | POA: Diagnosis not present

## 2020-11-05 DIAGNOSIS — Z7982 Long term (current) use of aspirin: Secondary | ICD-10-CM | POA: Diagnosis not present

## 2020-11-05 DIAGNOSIS — I2510 Atherosclerotic heart disease of native coronary artery without angina pectoris: Secondary | ICD-10-CM | POA: Diagnosis not present

## 2020-11-05 DIAGNOSIS — E785 Hyperlipidemia, unspecified: Secondary | ICD-10-CM | POA: Diagnosis not present

## 2020-11-07 DIAGNOSIS — Z7982 Long term (current) use of aspirin: Secondary | ICD-10-CM | POA: Diagnosis not present

## 2020-11-07 DIAGNOSIS — I69320 Aphasia following cerebral infarction: Secondary | ICD-10-CM | POA: Diagnosis not present

## 2020-11-07 DIAGNOSIS — H548 Legal blindness, as defined in USA: Secondary | ICD-10-CM | POA: Diagnosis not present

## 2020-11-07 DIAGNOSIS — E039 Hypothyroidism, unspecified: Secondary | ICD-10-CM | POA: Diagnosis not present

## 2020-11-07 DIAGNOSIS — E785 Hyperlipidemia, unspecified: Secondary | ICD-10-CM | POA: Diagnosis not present

## 2020-11-07 DIAGNOSIS — N184 Chronic kidney disease, stage 4 (severe): Secondary | ICD-10-CM | POA: Diagnosis not present

## 2020-11-07 DIAGNOSIS — I129 Hypertensive chronic kidney disease with stage 1 through stage 4 chronic kidney disease, or unspecified chronic kidney disease: Secondary | ICD-10-CM | POA: Diagnosis not present

## 2020-11-07 DIAGNOSIS — Z794 Long term (current) use of insulin: Secondary | ICD-10-CM | POA: Diagnosis not present

## 2020-11-07 DIAGNOSIS — Z9181 History of falling: Secondary | ICD-10-CM | POA: Diagnosis not present

## 2020-11-07 DIAGNOSIS — Z87891 Personal history of nicotine dependence: Secondary | ICD-10-CM | POA: Diagnosis not present

## 2020-11-07 DIAGNOSIS — E1122 Type 2 diabetes mellitus with diabetic chronic kidney disease: Secondary | ICD-10-CM | POA: Diagnosis not present

## 2020-11-07 DIAGNOSIS — E1140 Type 2 diabetes mellitus with diabetic neuropathy, unspecified: Secondary | ICD-10-CM | POA: Diagnosis not present

## 2020-11-07 DIAGNOSIS — I2510 Atherosclerotic heart disease of native coronary artery without angina pectoris: Secondary | ICD-10-CM | POA: Diagnosis not present

## 2020-11-13 ENCOUNTER — Ambulatory Visit (INDEPENDENT_AMBULATORY_CARE_PROVIDER_SITE_OTHER): Payer: Medicare Other | Admitting: Podiatry

## 2020-11-13 ENCOUNTER — Other Ambulatory Visit: Payer: Self-pay

## 2020-11-13 ENCOUNTER — Encounter: Payer: Self-pay | Admitting: Podiatry

## 2020-11-13 DIAGNOSIS — M79676 Pain in unspecified toe(s): Secondary | ICD-10-CM | POA: Diagnosis not present

## 2020-11-13 DIAGNOSIS — B351 Tinea unguium: Secondary | ICD-10-CM | POA: Diagnosis not present

## 2020-11-14 NOTE — Progress Notes (Signed)
He presents today chief complaint of painful elongated toenails 1 through 5 bilateral.  Objective: Pulses are palpable nails are long thick yellow dystrophic-like mycotic no open lesions or wounds are noted.  Assessment: Pain limb secondary to onychomycosis.  Plan: Debridement of toenails 1 through 5 bilateral.

## 2020-11-26 ENCOUNTER — Ambulatory Visit: Payer: Medicare Other | Admitting: Adult Health

## 2020-11-26 ENCOUNTER — Encounter: Payer: Self-pay | Admitting: Adult Health

## 2020-11-26 VITALS — BP 122/68 | HR 63 | Ht 71.0 in | Wt 233.0 lb

## 2020-11-26 DIAGNOSIS — Z794 Long term (current) use of insulin: Secondary | ICD-10-CM | POA: Diagnosis not present

## 2020-11-26 DIAGNOSIS — G4733 Obstructive sleep apnea (adult) (pediatric): Secondary | ICD-10-CM | POA: Diagnosis not present

## 2020-11-26 DIAGNOSIS — I10 Essential (primary) hypertension: Secondary | ICD-10-CM

## 2020-11-26 DIAGNOSIS — E1142 Type 2 diabetes mellitus with diabetic polyneuropathy: Secondary | ICD-10-CM

## 2020-11-26 DIAGNOSIS — G3184 Mild cognitive impairment, so stated: Secondary | ICD-10-CM | POA: Diagnosis not present

## 2020-11-26 DIAGNOSIS — E785 Hyperlipidemia, unspecified: Secondary | ICD-10-CM

## 2020-11-26 DIAGNOSIS — Z8673 Personal history of transient ischemic attack (TIA), and cerebral infarction without residual deficits: Secondary | ICD-10-CM | POA: Diagnosis not present

## 2020-11-26 DIAGNOSIS — R251 Tremor, unspecified: Secondary | ICD-10-CM

## 2020-11-26 NOTE — Patient Instructions (Addendum)
Continue aspirin 81 mg daily  and atorvastatin 80mg  daily  for secondary stroke prevention  Continue to follow up with PCP regarding cholesterol, blood pressure and diabetes management  Maintain strict control of hypertension with blood pressure goal below 130/90, diabetes with hemoglobin A1c goal below 7.0% and cholesterol with LDL cholesterol (bad cholesterol) goal below 70 mg/dL.   Suspicion that tremor is in setting of prior cerebellar stroke but we will continue to monitor for progression or worsening  Suspicion that drooling is from facial weakness on the left side - if symptoms worsen, we can consider doing a swallowing study to ensure no issues  Suspicion that your memory loss is likely in setting of prior strokes and age   You will be called to schedule an initial evaluation for underlying sleep apnea       Followup in the future with me in 6 months or call earlier if needed       Thank you for coming to see [place] at [facility]. I hope we have been able to provide you high quality care today.  You may receive a patient satisfaction survey over the next few weeks. We would appreciate your feedback and comments so that we may continue to improve ourselves and the health of our patients.

## 2020-11-26 NOTE — Progress Notes (Signed)
[facility] [address] [place]. [place] [id number] [phone]       HOSPITAL FOLLOW UP NOTE  [name] Date of Birth:  1925-05-15 Medical Record Number:  [id number]   Reason for Referral:  hospital stroke follow up    SUBJECTIVE:   CHIEF COMPLAINT:  Chief Complaint  Patient presents with   Follow-up    Rm 3 with daughter [name]  Pt is well and stable, no current symptoms.     HPI:   [name] is a 87 y.o. male with history of left eye blindness, cancer (left kidney), arthritis, cataract, CKD, DM, glaucoma, HTN, hx of vertigo, neuropathy and prior stroke who presented on 10/07/2020 from home with acute onset of expressive dysphasia (speaking and texting) that started around 8:30am initially resolved but en route to ED via EMS, reoccurrence of expressive aphasia "could not get words out" in conjunction with right facial droop which both rapidly resolved.  Personally reviewed hospitalization pertinent progress notes, lab work and imaging.  Evaluated by Dr. Leonie Man.  Stroke work-up revealed right precentral gyrus infarct embolic possibly secondary to cardioembolic source such as atrial fibrillation (hx documented on neuro note but not other mention of prior hx).  Baseline mild cognitive impairment as well as fall risk with multiple falls and lives alone but medication noncompliance and deemed to not be a good AC candidate there further monitoring not indicated.  Carotid Dopplers no significant stenosis.  EF 50 to 55%.  LDL 13.  A1c 9.3.  On aspirin 81 mg PTA although missed doses frequently and advised to restart aspirin 81 mg daily as well as continuation of atorvastatin 80 mg daily.  HTN stable on amlodipine.  Therapy eval's recommended HH therapies (chronic dizziness/vertigo, with 24-hour supervision and d/c'd to stay with his daughter until further recovery.    Today, 11/26/2020, patient being seen for hospital follow up accompanied by his  daughter. Overall stable since discharge. She has since returned back to his own home living independently (stayed with family for 1-2 weeks after discharge). He has since returned back to all prior activities. Maintains ADLs independently - does have maid for more intense cleaning, eats out a lot but will cook simple meals, he reports paying his own bills (although per daughter, his other daughter recently started assisting with this), has not yet returned back to driving (he was driving locally prior). Denies new or reoccurring stroke/TIA symptoms. Compliant on aspirin and atorvastatin without side effects. Blood pressure today 122/68.    Daughter has multiple other concerns today:   Drooling - present prior to recent stroke but questions worsening after recent stroke. Questions possible difficulty swallowing but does have hx of esophageal stricture needing stretching in the past  BUE Tremor present prior to stroke - not constant - usually occurs when he tries to write or hold an object. No tremors at rest.   Mild cognitive impairment - chronic. no worsening since stroke, short term memory loss main concern  Insomnia - chronic. Takes benadryl nightly (as advised by his granddaughter who is a Software engineer); hx of sleep apnea intolerant to CPAP - no use over the past 2 years  Occasional word finding difficulty - chronic. daughter believes worsening since recent stroke  On ROS sheet which was completed by daughters: Fatigue, blurred vision, loss of vision, easy bruising, easy bleeding, palpitations, swelling of legs, snoring, feeling cold, ringing in ears, spinning sensation, trouble swallowing, constipation, joint pain, rash, urination problems, incontinence, incontinence, allergies,  skin sensitivity, memory loss, weakness, dizziness, tremor, insomnia, sleepiness, snoring, depression, too much sleep, not enough sleep, decreased energy, disinterest in activities and racing thoughts      PERTINENT  IMAGING  MR BRAIN 10/07/2020 IMPRESSION: 1. Small acute infarcts in the right precentral gyrus and the anterior right frontal white matter. No significant edema or mass effect. 2. Remote infarcts in the right inferior cerebellum and right temporal lobe. 3. Mild for age chronic microvascular ischemic disease and atrophy.  MRA HEAD 10/07/2020 IMPRESSION: 1. Artifact rather than ELVO suspected at the Left MCA trifurcation, with MRI pending at this time. But if DWI is positive in the Left MCA territory then an LVO can not be excluded and should be considered. Follow-up CTA Head may be valuable in that scenario. 2. Otherwise negative for age intracranial MRA.  2D ECHO 10/07/2020 IMPRESSIONS   1. Left ventricular ejection fraction, by estimation, is 50 to 55%. The  left ventricle has low normal function. The left ventricle has no regional  wall motion abnormalities. Left ventricular diastolic parameters are  consistent with Grade I diastolic  dysfunction (impaired relaxation).   2. Right ventricular systolic function is normal. The right ventricular  size is normal.   3. Left atrial size was severely dilated.   4. The mitral valve is normal in structure. No evidence of mitral valve  regurgitation. No evidence of mitral stenosis.   5. The aortic valve is tricuspid. There is mild calcification of the  aortic valve. There is mild thickening of the aortic valve. Aortic valve  regurgitation is trivial. Mild aortic valve sclerosis is present, with no  evidence of aortic valve stenosis.  Aortic valve area, by VTI measures 3.54 cm. Aortic valve mean gradient  measures 4.0 mmHg. Aortic valve Vmax measures 1.22 m/s.   6. Aortic dilatation noted. 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   7. The inferior vena cava is normal in size with greater than 50%  respiratory variability, suggesting right atrial pressure of 3 mmHg.        ROS:    14 system review of systems performed and negative with exception of those listed in HPI  PMH:  Past Medical History:  Diagnosis Date   Arthritis    Blind left eye    Cancer of left kidney ([facility])    Cataract    Chronic kidney disease    Constipation    Diabetes mellitus without complication (HCC)    Glaucoma    Hypertension    Neuropathy    Stroke ([place])     PSH:  Past Surgical History:  Procedure Laterality Date   CHOLECYSTECTOMY     LEFT HEART CATH AND CORONARY ANGIOGRAPHY N/A 12/16/2019   Procedure: LEFT HEART CATH AND CORONARY ANGIOGRAPHY;  Surgeon: Martinique, Peter M, MD;  Location: [facility];  Service: Cardiovascular;  Laterality: N/A;   ORIF ANKLE FRACTURE Right 08/13/2019   Procedure: OPEN REDUCTION INTERNAL FIXATION (ORIF) ANKLE FRACTURE;  Surgeon: Renette Butters, MD;  Location: [facility];  Service: Orthopedics;  Laterality: Right;   THYROID SURGERY     TONSILLECTOMY      Social History:  Social History   Socioeconomic History   Marital status: Widowed    Spouse name: Not on file   Number of children: Not on file   Years of education: Not on file   Highest education level: Not on file  Occupational History   Occupation:  retired  Tobacco Use   Smoking status: Former    Types: Pipe    Quit date: 1983    Years since quitting: 39.8   Smokeless tobacco: Former  Substance and Sexual Activity   Alcohol use: Yes    Comment: social   Drug use: Never   Sexual activity: Not on file  Other Topics Concern   Not on file  Social History Narrative   Not on file   Social Determinants of Health   Financial Resource Strain: Not on file  Food Insecurity: Not on file  Transportation Needs: Not on file  Physical Activity: Not on file  Stress: Not on file  Social Connections: Not on file  Intimate Partner Violence: Not on file    Family History:  Family History  Problem Relation Age of Onset   Diabetes Mellitus II Mother     Medications:   Current  Outpatient Medications on File Prior to Visit  Medication Sig Dispense Refill   acetaminophen (TYLENOL) 325 MG tablet Take 650 mg by mouth every 4 (four) hours as needed for mild pain or headache.     amLODipine (NORVASC) 5 MG tablet Take 1 tablet (5 mg total) by mouth daily. Hold for next 3 days and start on 9/15 90 tablet 3   aspirin EC 81 MG EC tablet Take 1 tablet (81 mg total) by mouth daily. Swallow whole. 30 tablet 11   atorvastatin (LIPITOR) 80 MG tablet Take 80 mg by mouth daily.     betamethasone valerate (VALISONE) 0.1 % cream Apply 1 application topically every Monday, Wednesday, and Friday. For psoriasis -  HOLD     Calcium Polycarbophil (FIBER) 625 MG TABS Take 2 tablets by mouth as needed.     cholecalciferol (VITAMIN D3) 25 MCG (1000 UNIT) tablet 1 tablet     docusate sodium (COLACE) 100 MG capsule Take 100 mg by mouth 2 (two) times daily.     famotidine (PEPCID) 20 MG tablet Take 20 mg by mouth at bedtime.     gabapentin (NEURONTIN) 100 MG capsule Take 2 capsules (200 mg total) by mouth at bedtime. 60 capsule 0   insulin glargine (LANTUS) 100 UNIT/ML injection Lantus 45 units every morning, 40 units evening time, 10 mL 11   Insulin Pen Needle 29G X 12MM MISC Use as directed 200 each 0   levothyroxine (SYNTHROID) 125 MCG tablet Take 125 mcg by mouth daily before breakfast.     ONETOUCH ULTRA test strip 1 each 2 (two) times daily.     pantoprazole (PROTONIX) 40 MG tablet Take 40 mg by mouth daily.     polyethylene glycol (MIRALAX / GLYCOLAX) 17 g packet Take 17 g by mouth daily as needed.     Probiotic Product (ALIGN) 4 MG CAPS Take 4 mg by mouth daily as needed (constipation).     senna (SENOKOT) 8.6 MG TABS tablet Take 1 tablet (8.6 mg total) by mouth daily. (Patient not taking: No sig reported) 120 tablet 0   tamsulosin (FLOMAX) 0.4 MG CAPS capsule Take 1 capsule (0.4 mg total) by mouth daily. 30 capsule    timolol (TIMOPTIC) 0.5 % ophthalmic solution SMARTSIG:In Eye(s)      No current facility-administered medications on file prior to visit.    Allergies:   Allergies  Allergen Reactions   Other Other (See Comments)    Other reaction(s): rash   Adhesive [Tape] Rash      OBJECTIVE:  Physical Exam  Vitals:   11/26/20 1408  BP: 122/68  Pulse: 63  Weight: 233 lb (105.7 kg)  Height: 5\' 11"  (1.803 m)   Body mass index is 32.5 kg/m. No results found.  General: well developed, well nourished, very pleasant elderly Caucasian male, seated, in no evident distress Head: head normocephalic and atraumatic.   Neck: supple with no carotid or supraclavicular bruits Cardiovascular: regular rate and rhythm, no murmurs Musculoskeletal: no deformity Skin:  no rash/petichiae Vascular:  Normal pulses all extremities   Neurologic Exam Mental Status: Awake and fully alert.  Occasional speech hesitancy.  Oriented to place and time. Recent memory impaired and remote memory intact. Attention span, concentration and fund of knowledge mostly appropriate during visit. Mood and affect appropriate.  Cranial Nerves: Fundoscopic exam reveals sharp disc margins. Pupil briskly reactive to light in OD, OS pupil 18mm and nonreactive. OS severe visual loss at baseline.  Extraocular movements full without nystagmus. Visual fields intact in OD. Hearing intact. Facial sensation intact. Very slight left lower facial weakness (more noticeable when smiling).  Tongue, and palate moves normally and symmetrically.  Motor: Normal bulk and tone. Normal strength in all tested extremity muscles Sensory.: intact to touch , pinprick , position and vibratory sensation.  Coordination: Rapid alternating movements normal in all extremities. Finger-to-nose mild LUE incoordination and heel-to-shin performed accurately bilaterally. Mild postural tremor L>R UE.  No evidence of resting tremor or cogwheel rigidity Gait and Station: Arises from chair without difficulty. Stance is normal. Gait demonstrates  normal stride length and mild imbalance with use of cane. Tandem walk and heel toe not attempted.  Appropriate arm swing (although needs cane for ambulation) Reflexes: 1+ and symmetric. Toes downgoing.      NIHSS  1 Modified Rankin  1      ASSESSMENT: Martin Richardson is a 87 y.o. year old male with recent right precentral gyrus stroke, embolic secondary to unknown source - documented possibly in setting of A. Fib but unable to verify prior history. No a AC candidate therefore no indication on prolonged cardiac monitoring for possible A. fib. Vascular risk factors include HTN, HLD, DM with neuropathy, mild cognitive impairment and prior stroke history (R cerebellum and R temporal lobe).      PLAN:  Recurrent strokes:  Residual deficit: mild speech hesitancy and slight decline of short term memory. Discussed memory exercises and hopeful improvement as he gradually returns back to prior activities and independent  continue aspirin 81 mg daily  and atorvastatin 80 mg daily for secondary stroke prevention.   Discussed secondary stroke prevention measures and importance of close PCP follow up for aggressive stroke risk factor management. I have gone over the pathophysiology of stroke, warning signs and symptoms, risk factors and their management in some detail with instructions to go to the closest emergency room for symptoms of concern. Tremor: chronic. Possibly in setting of prior cerebellar stroke. No fm hx of tremors or Parkinson's disease. Will continue to monitor for progression Drooling: mild left facial weakness possibly contributing to drooling concerns.  Occasional swallowing difficulties which are not new. If symptoms progress, may need to obtain swallowing study Mild cognitive impairment: chronic. No worsening after recent stroke. Likely multifactorial with history of prior strokes and age. Discussed memory exercises and compensation strategies. MMSE not completed due to time  constraints Sleep apnea: Prior diagnosis with sleep study >4 years ago. Has been off CPAP for over the past 2 years due to difficulty tolerating mask. C/o insomnia. He is interested in repeating and discussing other potential treatment  options or mask types HTN: BP goal <130/90.  Stable on current regimen per PCP HLD: LDL goal <70. Recent LDL 13 on atorvastatin 80mg  daily per PCP.  DMII: A1c goal<7.0. Recent A1c 9.3.  Routinely monitored by PCP currently on insulin    Follow up in 6 months or call earlier if needed   CC:  [facility] provider: Dr. Leonie Man PCP: Kristen Loader, FNP    I spent 67 minutes of face-to-face and non-face-to-face time with patient and daughter.  This included previsit chart review including review of recent hospitalization, lab review, study review, order entry, electronic health record documentation, patient and daughter education regarding recent stroke including etiology as well as history of prior stroke, secondary stroke prevention measures and importance of managing stroke risk factors, multiple other concerns (see HPI and A/P) with multiple questions answered and answered all other questions to patient and daughters satisfaction   Frann Rider, AGNP-BC  [facility] [address] [place], [place] [address]  Phone [phone] Fax [phone] Note: This document was prepared with digital dictation and possible smart phrase technology. Any transcriptional errors that result from this process are unintentional.

## 2020-12-04 DIAGNOSIS — H8111 Benign paroxysmal vertigo, right ear: Secondary | ICD-10-CM | POA: Diagnosis not present

## 2020-12-04 DIAGNOSIS — R262 Difficulty in walking, not elsewhere classified: Secondary | ICD-10-CM | POA: Diagnosis not present

## 2020-12-04 DIAGNOSIS — M2560 Stiffness of unspecified joint, not elsewhere classified: Secondary | ICD-10-CM | POA: Diagnosis not present

## 2020-12-04 DIAGNOSIS — M6281 Muscle weakness (generalized): Secondary | ICD-10-CM | POA: Diagnosis not present

## 2020-12-07 DIAGNOSIS — M2560 Stiffness of unspecified joint, not elsewhere classified: Secondary | ICD-10-CM | POA: Diagnosis not present

## 2020-12-07 DIAGNOSIS — M6281 Muscle weakness (generalized): Secondary | ICD-10-CM | POA: Diagnosis not present

## 2020-12-07 DIAGNOSIS — R262 Difficulty in walking, not elsewhere classified: Secondary | ICD-10-CM | POA: Diagnosis not present

## 2020-12-07 DIAGNOSIS — H8111 Benign paroxysmal vertigo, right ear: Secondary | ICD-10-CM | POA: Diagnosis not present

## 2020-12-11 DIAGNOSIS — H8111 Benign paroxysmal vertigo, right ear: Secondary | ICD-10-CM | POA: Diagnosis not present

## 2020-12-11 DIAGNOSIS — M6281 Muscle weakness (generalized): Secondary | ICD-10-CM | POA: Diagnosis not present

## 2020-12-11 DIAGNOSIS — M2560 Stiffness of unspecified joint, not elsewhere classified: Secondary | ICD-10-CM | POA: Diagnosis not present

## 2020-12-11 DIAGNOSIS — R262 Difficulty in walking, not elsewhere classified: Secondary | ICD-10-CM | POA: Diagnosis not present

## 2020-12-14 DIAGNOSIS — M2560 Stiffness of unspecified joint, not elsewhere classified: Secondary | ICD-10-CM | POA: Diagnosis not present

## 2020-12-14 DIAGNOSIS — M6281 Muscle weakness (generalized): Secondary | ICD-10-CM | POA: Diagnosis not present

## 2020-12-14 DIAGNOSIS — R262 Difficulty in walking, not elsewhere classified: Secondary | ICD-10-CM | POA: Diagnosis not present

## 2020-12-14 DIAGNOSIS — H8111 Benign paroxysmal vertigo, right ear: Secondary | ICD-10-CM | POA: Diagnosis not present

## 2020-12-20 ENCOUNTER — Other Ambulatory Visit: Payer: Self-pay | Admitting: Physician Assistant

## 2020-12-24 ENCOUNTER — Other Ambulatory Visit: Payer: Self-pay

## 2020-12-24 MED ORDER — AMLODIPINE BESYLATE 5 MG PO TABS: 5.0000 mg | ORAL_TABLET | Freq: Every day | ORAL | 3 refills | Status: DC

## 2020-12-25 DIAGNOSIS — M2560 Stiffness of unspecified joint, not elsewhere classified: Secondary | ICD-10-CM | POA: Diagnosis not present

## 2020-12-25 DIAGNOSIS — R262 Difficulty in walking, not elsewhere classified: Secondary | ICD-10-CM | POA: Diagnosis not present

## 2020-12-25 DIAGNOSIS — M6281 Muscle weakness (generalized): Secondary | ICD-10-CM | POA: Diagnosis not present

## 2020-12-25 DIAGNOSIS — H8111 Benign paroxysmal vertigo, right ear: Secondary | ICD-10-CM | POA: Diagnosis not present

## 2020-12-28 DIAGNOSIS — M2560 Stiffness of unspecified joint, not elsewhere classified: Secondary | ICD-10-CM | POA: Diagnosis not present

## 2020-12-28 DIAGNOSIS — H8111 Benign paroxysmal vertigo, right ear: Secondary | ICD-10-CM | POA: Diagnosis not present

## 2020-12-28 DIAGNOSIS — M6281 Muscle weakness (generalized): Secondary | ICD-10-CM | POA: Diagnosis not present

## 2020-12-28 DIAGNOSIS — R262 Difficulty in walking, not elsewhere classified: Secondary | ICD-10-CM | POA: Diagnosis not present

## 2020-12-31 DIAGNOSIS — M2560 Stiffness of unspecified joint, not elsewhere classified: Secondary | ICD-10-CM | POA: Diagnosis not present

## 2020-12-31 DIAGNOSIS — R262 Difficulty in walking, not elsewhere classified: Secondary | ICD-10-CM | POA: Diagnosis not present

## 2020-12-31 DIAGNOSIS — H8111 Benign paroxysmal vertigo, right ear: Secondary | ICD-10-CM | POA: Diagnosis not present

## 2020-12-31 DIAGNOSIS — M6281 Muscle weakness (generalized): Secondary | ICD-10-CM | POA: Diagnosis not present

## 2021-01-04 DIAGNOSIS — R262 Difficulty in walking, not elsewhere classified: Secondary | ICD-10-CM | POA: Diagnosis not present

## 2021-01-04 DIAGNOSIS — H8111 Benign paroxysmal vertigo, right ear: Secondary | ICD-10-CM | POA: Diagnosis not present

## 2021-01-04 DIAGNOSIS — M6281 Muscle weakness (generalized): Secondary | ICD-10-CM | POA: Diagnosis not present

## 2021-01-04 DIAGNOSIS — M2560 Stiffness of unspecified joint, not elsewhere classified: Secondary | ICD-10-CM | POA: Diagnosis not present

## 2021-01-10 DIAGNOSIS — G4700 Insomnia, unspecified: Secondary | ICD-10-CM | POA: Diagnosis not present

## 2021-01-10 DIAGNOSIS — N2581 Secondary hyperparathyroidism of renal origin: Secondary | ICD-10-CM | POA: Diagnosis not present

## 2021-01-10 DIAGNOSIS — D649 Anemia, unspecified: Secondary | ICD-10-CM | POA: Diagnosis not present

## 2021-01-10 DIAGNOSIS — K219 Gastro-esophageal reflux disease without esophagitis: Secondary | ICD-10-CM | POA: Diagnosis not present

## 2021-01-10 DIAGNOSIS — I2510 Atherosclerotic heart disease of native coronary artery without angina pectoris: Secondary | ICD-10-CM | POA: Diagnosis not present

## 2021-01-10 DIAGNOSIS — E1169 Type 2 diabetes mellitus with other specified complication: Secondary | ICD-10-CM | POA: Diagnosis not present

## 2021-01-10 DIAGNOSIS — E785 Hyperlipidemia, unspecified: Secondary | ICD-10-CM | POA: Diagnosis not present

## 2021-01-10 DIAGNOSIS — N184 Chronic kidney disease, stage 4 (severe): Secondary | ICD-10-CM | POA: Diagnosis not present

## 2021-01-10 DIAGNOSIS — I10 Essential (primary) hypertension: Secondary | ICD-10-CM | POA: Diagnosis not present

## 2021-01-10 DIAGNOSIS — E039 Hypothyroidism, unspecified: Secondary | ICD-10-CM | POA: Diagnosis not present

## 2021-01-16 DIAGNOSIS — N184 Chronic kidney disease, stage 4 (severe): Secondary | ICD-10-CM | POA: Diagnosis not present

## 2021-01-16 DIAGNOSIS — D649 Anemia, unspecified: Secondary | ICD-10-CM | POA: Diagnosis not present

## 2021-01-16 DIAGNOSIS — I10 Essential (primary) hypertension: Secondary | ICD-10-CM | POA: Diagnosis not present

## 2021-01-16 DIAGNOSIS — N2581 Secondary hyperparathyroidism of renal origin: Secondary | ICD-10-CM | POA: Diagnosis not present

## 2021-01-16 DIAGNOSIS — K219 Gastro-esophageal reflux disease without esophagitis: Secondary | ICD-10-CM | POA: Diagnosis not present

## 2021-01-16 DIAGNOSIS — E039 Hypothyroidism, unspecified: Secondary | ICD-10-CM | POA: Diagnosis not present

## 2021-01-16 DIAGNOSIS — G629 Polyneuropathy, unspecified: Secondary | ICD-10-CM | POA: Diagnosis not present

## 2021-01-16 DIAGNOSIS — E785 Hyperlipidemia, unspecified: Secondary | ICD-10-CM | POA: Diagnosis not present

## 2021-01-16 DIAGNOSIS — Z794 Long term (current) use of insulin: Secondary | ICD-10-CM | POA: Diagnosis not present

## 2021-01-16 DIAGNOSIS — E1169 Type 2 diabetes mellitus with other specified complication: Secondary | ICD-10-CM | POA: Diagnosis not present

## 2021-01-16 DIAGNOSIS — Z0000 Encounter for general adult medical examination without abnormal findings: Secondary | ICD-10-CM | POA: Diagnosis not present

## 2021-01-16 DIAGNOSIS — K5900 Constipation, unspecified: Secondary | ICD-10-CM | POA: Diagnosis not present

## 2021-01-24 ENCOUNTER — Other Ambulatory Visit: Payer: Self-pay

## 2021-01-24 ENCOUNTER — Encounter: Payer: Self-pay | Admitting: Podiatry

## 2021-01-24 ENCOUNTER — Ambulatory Visit: Payer: Medicare Other | Admitting: Podiatry

## 2021-01-24 ENCOUNTER — Ambulatory Visit (INDEPENDENT_AMBULATORY_CARE_PROVIDER_SITE_OTHER): Payer: Medicare Other | Admitting: Podiatry

## 2021-01-24 DIAGNOSIS — B351 Tinea unguium: Secondary | ICD-10-CM

## 2021-01-24 DIAGNOSIS — M79676 Pain in unspecified toe(s): Secondary | ICD-10-CM | POA: Diagnosis not present

## 2021-01-24 NOTE — Progress Notes (Signed)
He presents today chief complaint of painfully elongated toenails.  Objective: Toenails are long thick yellow dystrophic clinically mycotic pulses remain palpable no open lesions or wounds are noted.  Mild edema bilateral lower extremities.  Assessment: Pain in limb secondary to onychomycosis mild edema bilateral.  Plan: Instructed he should follow-up with primary care for the edema and to make sure that it is not associated with any electrolyte disturbances kidney function or medication side effects..  I debrided nails 1 through 5 bilateral cover service secondary to pain.

## 2021-01-28 DIAGNOSIS — R6889 Other general symptoms and signs: Secondary | ICD-10-CM | POA: Diagnosis not present

## 2021-02-21 ENCOUNTER — Encounter: Payer: Self-pay | Admitting: Neurology

## 2021-02-21 ENCOUNTER — Ambulatory Visit: Payer: Medicare Other | Admitting: Neurology

## 2021-02-21 VITALS — BP 128/72 | HR 59 | Ht 71.0 in | Wt 220.0 lb

## 2021-02-21 DIAGNOSIS — N131 Hydronephrosis with ureteral stricture, not elsewhere classified: Secondary | ICD-10-CM | POA: Diagnosis not present

## 2021-02-21 DIAGNOSIS — D508 Other iron deficiency anemias: Secondary | ICD-10-CM | POA: Diagnosis not present

## 2021-02-21 DIAGNOSIS — G4719 Other hypersomnia: Secondary | ICD-10-CM

## 2021-02-21 DIAGNOSIS — Z8673 Personal history of transient ischemic attack (TIA), and cerebral infarction without residual deficits: Secondary | ICD-10-CM | POA: Diagnosis not present

## 2021-02-21 DIAGNOSIS — D649 Anemia, unspecified: Secondary | ICD-10-CM | POA: Insufficient documentation

## 2021-02-21 NOTE — Patient Instructions (Signed)

## 2021-02-21 NOTE — Progress Notes (Signed)
SLEEP MEDICINE CLINIC    Provider:  Larey Seat, Martin  Primary Care Physician:  Kristen Loader, [place] [address]     Referring Provider: Frann Rider, [place] [address] [place],  [place] [address]          Chief Complaint according to patient   Patient presents with:     New Patient (Initial Visit)           HISTORY OF PRESENT ILLNESS:  Martin Richardson is a 88 y.o. Caucasian male patient seen here as a referral on 02/21/2021 from Stroke Team NP  for a sleep apnea evaluation. .  Chief concern according to patient :  Pt with daughter, rm 30. Pt states that until recently he would avg about 7 hrs of sleep. He was diagnosed with apnea in [place] and was not having follow up, developed problems with CPAP, FFM left bleeding blisters on outside of the mouth, chin strap, he was frustrated.    It has been more recently that he has had problems with going to sleep and staying asleep. He is staying up til midnight watching acc basketball and some stressful. Pt has been diagnosed with OSA before. He wore a CPAP at one time and it caused problems with the cpap rubbing blisters. He stopped about 2019.    I have the pleasure of seeing Martin Richardson today, a right-handed male.  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   I have no data form the [place] sleep study. Sleep relevant medical history: Nocturia/ deviated septum , choking at night.     Family medical /sleep history: no other family member on CPAP with OSA    Social history:  Patient is retired from Press [employer], Orthoptist- and lives in a household  alone.  Widowed in 2018, was caretaker of his wife who had MS, lived in [place] until 2021. Tobacco use;none.   ETOH use rare , Caffeine intake in form of Coffee( 1 cup most mornings) Soda( /) Tea ( 1 a day) or energy drinks.   Hobbies :[employer]-      Sleep habits are as follows: The patient's dinner time is between 5-7 PM. The patient goes to bed at 11-12 PM and continues to sleep for 2-3 hours, wakes for one bathroom breaks.6-7 hours of sleep total.  The preferred sleep position is sideways and supine , with the support of 2 pillows.  Head elevated Dreams are reportedly  frequent/vivid.   7-7.30 AM is the usual rise time. The patient wakes up spontaneously.  He reports not feeling refreshed or restored in AM, with symptoms such as dry mouth, morning headaches, and residual fatigue.  Naps frequently in a chair- 10 minutes to 120 minutes !  Review of Systems: Out of a complete 14 system review, the patient complains of only the following symptoms, and all other reviewed systems are negative.:  Fatigue, sleepiness , snoring, fragmented sleep, tachy brady-   How likely are you to doze in the following situations: 0 = not likely, 1 = slight chance, 2 = moderate chance, 3 = high chance   Sitting and Reading? Watching Television? Sitting inactive in a public place (theater or meeting)? As a passenger  in a car for an hour without a break? Lying down in the afternoon when circumstances permit? Sitting and talking to someone? Sitting quietly after lunch without alcohol? In a car, while stopped for a few minutes in traffic?   Total = 24/ 24 points   FSS endorsed at 35/ 63 points.   Social History   Socioeconomic History   Marital status: Widowed    Spouse name: Not on file   Number of children: Not on file   Years of education: Not on file   Highest education level: Not on file  Occupational History   Occupation: retired  Tobacco Use   Smoking status: Former    Types: Pipe    Quit date: 1983    Years since quitting: 40.0   Smokeless tobacco: Former   Substance and Sexual Activity   Alcohol use: Yes    Comment: social   Drug use: Never   Sexual activity: Not on file  Other Topics Concern   Not on file  Social History Narrative   Not on file   Social Determinants of Health   Financial Resource Strain: Not on file  Food Insecurity: Not on file  Transportation Needs: Not on file  Physical Activity: Not on file  Stress: Not on file  Social Connections: Not on file    Family History  Problem Relation Age of Onset   Diabetes Mellitus II Mother     Past Medical History:  Diagnosis Date   Arthritis    Blind left eye    Cancer of left kidney ([place])    Cataract    Chronic kidney disease    Constipation    Diabetes mellitus without complication ([place])    Glaucoma    Hypertension    Neuropathy    Stroke [facility])     Past Surgical History:  Procedure Laterality Date   CHOLECYSTECTOMY     LEFT HEART CATH AND CORONARY ANGIOGRAPHY N/A 12/16/2019   Procedure: LEFT HEART CATH AND CORONARY ANGIOGRAPHY;  Surgeon: Martinique, Peter M, Martin;  Location: [facility];  Service: Cardiovascular;  Laterality: N/A;   ORIF ANKLE FRACTURE Right 08/13/2019   Procedure: OPEN REDUCTION INTERNAL FIXATION (ORIF) ANKLE FRACTURE;  Surgeon: Renette Butters, Martin;  Location: [facility];  Service: Orthopedics;  Laterality: Right;   THYROID SURGERY     TONSILLECTOMY       Current Outpatient Medications on File Prior to Visit  Medication Sig Dispense Refill   acetaminophen (TYLENOL) 325 MG tablet Take 650 mg by mouth every 4 (four) hours as needed for mild pain or headache.     amLODipine (NORVASC) 5 MG tablet Take 1 tablet (5 mg total) by mouth daily. 90 tablet 3   aspirin EC 81 MG EC tablet Take 1 tablet (81 mg total) by mouth daily. Swallow whole. 30 tablet 11   atorvastatin (LIPITOR) 80 MG tablet Take 80 mg by mouth daily.     betamethasone valerate (VALISONE) 0.1 % cream Apply 1 application topically every Monday, Wednesday, and Friday. For psoriasis -   HOLD     cholecalciferol (VITAMIN D3) 25 MCG (1000 UNIT) tablet 1 tablet     diphenhydrAMINE (BENADRYL) 25 MG tablet Take 25 mg by mouth at bedtime.     docusate sodium (COLACE) 100 MG capsule Take 100 mg by mouth 2 (two) times daily.     famotidine (PEPCID) 20 MG tablet Take 20 mg by mouth at bedtime.     gabapentin (NEURONTIN) 100 MG  capsule Take 2 capsules (200 mg total) by mouth at bedtime. 60 capsule 0   insulin glargine (LANTUS) 100 UNIT/ML injection Lantus 45 units every morning, 40 units evening time, 10 mL 11   Insulin Pen Needle 29G X 12MM MISC Use as directed 200 each 0   levothyroxine (SYNTHROID) 125 MCG tablet Take 125 mcg by mouth daily before breakfast.     lidocaine (XYLOCAINE) 2 % solution Use as directed 15 mLs in the mouth or throat as needed for mouth pain.     ONETOUCH ULTRA test strip 1 each 2 (two) times daily.     pantoprazole (PROTONIX) 40 MG tablet Take 40 mg by mouth daily.     Probiotic Product (ALIGN) 4 MG CAPS Take 4 mg by mouth daily as needed (constipation).     tamsulosin (FLOMAX) 0.4 MG CAPS capsule Take 1 capsule (0.4 mg total) by mouth daily. 30 capsule    timolol (TIMOPTIC) 0.5 % ophthalmic solution SMARTSIG:In Eye(s)     triamcinolone cream (KENALOG) 0.1 % Apply 1 application topically as needed.     No current facility-administered medications on file prior to visit.    Allergies  Allergen Reactions   Other Other (See Comments)    Other reaction(s): rash   Adhesive [Tape] Rash    Physical exam:  Today's Vitals   02/21/21 0953  BP: 128/72  Pulse: (!) 59  Weight: 220 lb (99.8 kg)  Height: 5\' 11"  (1.803 m)   Body mass index is 30.68 kg/m.   Wt Readings from Last 3 Encounters:  02/21/21 220 lb (99.8 kg)  11/26/20 233 lb (105.7 kg)  10/08/20 234 lb 12.6 oz (106.5 kg)     Ht Readings from Last 3 Encounters:  02/21/21 5\' 11"  (1.803 m)  11/26/20 5\' 11"  (1.803 m)  10/08/20 5\' 11"  (1.803 m)      General: The patient is awake, alert and  appears not in acute distress. The patient is well groomed. Head: Normocephalic, atraumatic. Neck is supple. Mallampati 3,  neck circumference:16.5 inches . Nasal airflow  patent.  Retrognathia is not seen.  Dental status: BIOLOGICAL- loud snoring.  Cardiovascular:  Regular rate and cardiac rhythm by pulse,  without distended neck veins. Respiratory: Lungs are clear to auscultation.  Skin:  Without evidence of ankle edema, or rash. Trunk: The patient's posture is erect.   Neurologic exam : The patient is awake and alert, oriented to place and time.   Memory subjective described as intact.  Attention span & concentration ability appears normal.  Speech is fluent,  without  dysarthria, dysphonia or aphasia.  Mood and affect are appropriate.   Cranial nerves: no loss of smell or taste reported  Pupils are equal and briskly reactive to light. Funduscopic exam deferred. Left eye  blindness, lazy eye since birth . Cataract in the left present, removed in the right.  Extraocular movements in vertical and horizontal planes were intact and without nystagmus. No Diplopia. Visual fields monocular  Hearing was intact to soft voice and finger rubbing.    Facial sensation intact to fine touch.  Facial motor strength is symmetric and tongue and uvula move midline.  Neck ROM : rotation, tilt and flexion extension were normal for age and shoulder shrug was symmetrical.    Motor exam:  Symmetric bulk, tone and ROM.   Normal tone without cog- wheeling, symmetric grip strength .   Sensory:  Fine touch and vibration were normal in upper extremities, feet numbness, fingertips numbness. Marland Kitchen  Proprioception tested in the upper extremities was normal.   Coordination: Rapid alternating movements in the fingers/hands were of normal speed.  Deferred.   Gait and station: Patient could rise unassisted from a seated position, walked without assistive device.  Stance is of normal width/ base and the patient turned  with 4 steps.       After spending a total time of  45 minutes face to face and additional time for physical and neurologic examination, review of laboratory studies,  personal review of imaging studies, reports and results of other testing and review of referral information / records as far as provided in visit, I have established the following assessments:  Reviewed MRI :  IMPRESSION: 1. Small acute infarcts in the right precentral gyrus and the anterior right frontal white matter. No significant edema or mass effect. 2. Remote infarcts in the right inferior cerebellum and right temporal lobe. 3. Mild for age chronic microvascular ischemic disease and atrophy.     Electronically Signed   By: Margaretha Sheffield M.D.   On: 10/07/2020 13:13  Assessment: excessive daytime sleepiness, associated and exacerbated since second stroke-  likely has untreated OSA, but 2 additional strokes have left him with higher risk of Central apnea.    My Plan is to proceed with:  1) attended sleep study- SPLIT and let patient decide to use nasal cradle or nasal pillows, he has a higher risk of central apneas with this comorbidity.- CKD, CHF, CVA, Anemia, tachybrady cardia, Vertigo. .    I would like to thank Kristen Loader, FNP and Frann Rider, [place] [address] [place],  [place] [address] for allowing me to meet with and to take care of this pleasant patient.   In short, Martin Richardson is presenting with EDS. Daily naps.   Electronically signed by: Larey Seat, Martin 02/21/2021 10:20 AM  [facility] Board certified by The [employer] of Sleep Medicine and Diplomate of the [employer] of Sleep Medicine. Board certified In Neurology through the [employer], Fellow of the [employer] of Neurology. Medical Director of [facility].

## 2021-03-03 ENCOUNTER — Ambulatory Visit (INDEPENDENT_AMBULATORY_CARE_PROVIDER_SITE_OTHER): Payer: Medicare Other | Admitting: Neurology

## 2021-03-03 ENCOUNTER — Other Ambulatory Visit: Payer: Self-pay

## 2021-03-03 DIAGNOSIS — G4710 Hypersomnia, unspecified: Secondary | ICD-10-CM

## 2021-03-03 DIAGNOSIS — G4719 Other hypersomnia: Secondary | ICD-10-CM

## 2021-03-03 DIAGNOSIS — D508 Other iron deficiency anemias: Secondary | ICD-10-CM

## 2021-03-03 DIAGNOSIS — C649 Malignant neoplasm of unspecified kidney, except renal pelvis: Secondary | ICD-10-CM

## 2021-03-03 DIAGNOSIS — N131 Hydronephrosis with ureteral stricture, not elsewhere classified: Secondary | ICD-10-CM

## 2021-03-03 DIAGNOSIS — Z8673 Personal history of transient ischemic attack (TIA), and cerebral infarction without residual deficits: Secondary | ICD-10-CM

## 2021-03-19 DIAGNOSIS — C649 Malignant neoplasm of unspecified kidney, except renal pelvis: Secondary | ICD-10-CM | POA: Insufficient documentation

## 2021-03-19 MED ORDER — UNISOM SLEEPGELS 50 MG PO CAPS: ORAL_CAPSULE | ORAL | 0 refills | Status: DC

## 2021-03-19 NOTE — Procedures (Signed)
PATIENT'S NAME:  Martin Richardson, Martin Richardson DOB:      Dec 15, 1925      MR#:    [id number]     DATE OF RECORDING: 03/03/2021 Martin Richardson REFERRING M.D.:  Frann Rider, NP Study Performed:   Baseline Polysomnogram HISTORY:   [name] is a 88 y.o. Caucasian male patient seen here as a referral on 02/21/2021 from Stroke Team NP for a sleep apnea evaluation.   Chief concern according to patient and his daughter: Until recently, this patient would average about 7 hours of sleep but is now unable to sleep for several months. He was diagnosed with sleep apnea years ago in [place] and was not having follow up, he then developed problems with CPAP, FFM left bleeding blisters on outside of the mouth, tried chin strap, he was frustrated. He stopped using CPAP about 2019. He was the main caretaker of his sick wife at the time.  I have no data from the [place] sleep study. The patient reports feeling fatigued and excessively sleepy, but also getting only few hours of nocturnal sleep.   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 Diabetes mellitus with Neuropathy ([place]), Glaucoma, Hypertension, dysphagia, dysphonia, Vertigo, deviated nasal septum.   The patient endorsed the Epworth Sleepiness Scale at 24 points. FSS at 35/ 63 points.   The patient's weight 220 pounds with a height of 71 (inches), resulting in a BMI of 30.9 kg/m2. The patient's neck circumference measured 16.5 inches.  CURRENT MEDICATIONS: Tylenol, Norvasc, Aspirin, Lipitor, Valisone, Vitamin D3, Benadryl, Colace, Pepcid, Neurontin, Synthroid, Xylocaine, Protonix, Align, Flomax, Timoptic, Kenalog   PROCEDURE:  This is a multichannel digital polysomnogram utilizing the Somnostar 11.2 system.  Electrodes and sensors were applied and monitored per AASM Specifications.   EEG,  EOG, Chin and Limb EMG, were sampled at 200 Hz.  ECG, Snore and Nasal Pressure, Thermal Airflow, Respiratory Effort, CPAP Flow and Pressure, Oximetry was sampled at 50 Hz. Digital video and audio were recorded.      BASELINE STUDY: Lights Out was at 22:13 and Lights On at 05:01.  Total recording time (TRT) was 408.5 minutes, with a total sleep time (TST) of 8.5 minutes.   The patient's sleep latency was 0 minutes.  REM latency was 0 minutes.  The sleep efficiency was 2.1 %.     SLEEP ARCHITECTURE: WASO (Wake after sleep onset) was 5.5 minutes.  There were 6 minutes in Stage N1, 2.5 minutes Stage N2, 0 minutes Stage N3 and 0 minutes in Stage REM.  The percentage of Stage N1 was 70.6%, Stage N2 was 29.4%, Stage N3 was 0% and Stage R (REM sleep) was 0%.   RESPIRATORY ANALYSIS:  There were a total of 8 respiratory events:  0 obstructive apneas, 8 central apneas and 0 mixed apneas with a total of 8 apneas and an apnea index (AI) of 56.5 /hour. There were 0 hypopneas with a hypopnea index of 0 /hour. The patient also had 0 respiratory event related arousals (RERAs).      The total APNEA/HYPOPNEA INDEX (AHI) was 56.5/hour and the total RESPIRATORY DISTURBANCE INDEX was  56.5 /hour.  0 events occurred in REM sleep and 8 events in NREM. The REM AHI was  0 /hour, versus a non-REM AHI of 56.5. The patient spent 8.5 minutes of total sleep time in the supine position and  0 minutes in non-supine. The supine AHI was 56.5 versus a non-supine AHI of 0.0.  OXYGEN SATURATION & C02:  The Wake baseline 02 saturation was 92%, with the lowest being 86%. Time spent below 89% saturation equaled 4 minutes. The arousals were noted as: 7 were spontaneous, 0 were associated with PLMs, 4 were associated with respiratory events. The patient had a total of 0 Periodic Limb Movements.   EKG was in regular rhythm  Post-study, the patient indicated that sleep was the same as usual.    IMPRESSION:  Dear Janett Billow,  We only recorded  8.5 minutes of sleep. I suggest using a HST instead to gather more data.   The patient was unable to sleep in the lab- severe insomnia.  The short sleep time recorded is not valid for a diagnosis but lets me suspect central apnea.  3 times nocturia during recording time.    RECOMMENDATIONS:  HST to allow gathering more sleep time in order to confirm a diagnosis.  Recommend Insomnia treatment with Unisom, an over the counter product.    I certify that I have reviewed the entire raw data recording prior to the issuance of this report in accordance with the Standards of Accreditation of the American Academy of Sleep Medicine (AASM)    Larey Seat, MD Diplomat, American Board of Psychiatry and Neurology  Diplomat, American Board of Sleep Medicine Market researcher, [place] [facility] [employer]

## 2021-03-19 NOTE — Progress Notes (Signed)
Lets try HST as this in-lab study only collected 8.5 minutes of sleep data.   I like for Mr Martin Richardson to use a sleep aid such as unisom ( over the counter) when he undergoes this next sleep test.   PS : Happy B-day, Mr Patchin !

## 2021-03-19 NOTE — Addendum Note (Signed)
Addended by: Larey Seat on: 03/19/2021 04:45 PM   Modules accepted: Orders

## 2021-03-19 NOTE — Progress Notes (Signed)
Dear Martin Richardson,  We only recorded 8.5 minutes of sleep. I suggest using a HST instead to gather more data.   1. The patient was unable to sleep in the lab- severe insomnia.  2. The short sleep time recorded is not valid for a diagnosis but lets me suspect central apnea.  3. 3 times nocturia during recording time.    RECOMMENDATIONS:  1. HST to allow gathering more sleep time in order to confirm a diagnosis.  2. Recommend Insomnia treatment with Unisom, an over the counter product.

## 2021-03-20 ENCOUNTER — Telehealth: Payer: Self-pay | Admitting: *Deleted

## 2021-03-20 DIAGNOSIS — G4733 Obstructive sleep apnea (adult) (pediatric): Secondary | ICD-10-CM

## 2021-03-20 DIAGNOSIS — Z8673 Personal history of transient ischemic attack (TIA), and cerebral infarction without residual deficits: Secondary | ICD-10-CM

## 2021-03-20 DIAGNOSIS — G3184 Mild cognitive impairment, so stated: Secondary | ICD-10-CM

## 2021-03-20 DIAGNOSIS — G4719 Other hypersomnia: Secondary | ICD-10-CM

## 2021-03-20 NOTE — Telephone Encounter (Signed)
-----   Message from Larey Seat, MD sent at 03/19/2021  4:51 PM EST ----- Dear Janett Billow,  We only recorded 8.5 minutes of sleep. I suggest using a HST instead to gather more data.   1. The patient was unable to sleep in the lab- severe insomnia.  2. The short sleep time recorded is not valid for a diagnosis but lets me suspect central apnea.  3. 3 times nocturia during recording time.    RECOMMENDATIONS:  1. HST to allow gathering more sleep time in order to confirm a diagnosis.  2. Recommend Insomnia treatment with Unisom, an over the counter product.

## 2021-03-20 NOTE — Telephone Encounter (Signed)
Called daughter, Jeannene Patella [facility]). Relayed Dr. Edwena Felty message. Agreeable to move forward with HST. Asked we call her father, Tabius to schedule. If unable to reach, can call other daughter Tama Headings at [phone] to schedule.  He is already taking benadryl po qhs. Will continue this.

## 2021-04-02 DIAGNOSIS — N184 Chronic kidney disease, stage 4 (severe): Secondary | ICD-10-CM | POA: Diagnosis not present

## 2021-04-09 DIAGNOSIS — N184 Chronic kidney disease, stage 4 (severe): Secondary | ICD-10-CM | POA: Diagnosis not present

## 2021-04-09 DIAGNOSIS — R338 Other retention of urine: Secondary | ICD-10-CM | POA: Diagnosis not present

## 2021-04-09 DIAGNOSIS — I129 Hypertensive chronic kidney disease with stage 1 through stage 4 chronic kidney disease, or unspecified chronic kidney disease: Secondary | ICD-10-CM | POA: Diagnosis not present

## 2021-04-09 DIAGNOSIS — N2581 Secondary hyperparathyroidism of renal origin: Secondary | ICD-10-CM | POA: Diagnosis not present

## 2021-04-09 DIAGNOSIS — D631 Anemia in chronic kidney disease: Secondary | ICD-10-CM | POA: Diagnosis not present

## 2021-04-09 DIAGNOSIS — C642 Malignant neoplasm of left kidney, except renal pelvis: Secondary | ICD-10-CM | POA: Diagnosis not present

## 2021-04-15 ENCOUNTER — Ambulatory Visit (INDEPENDENT_AMBULATORY_CARE_PROVIDER_SITE_OTHER): Payer: Medicare Other | Admitting: Neurology

## 2021-04-15 DIAGNOSIS — G4733 Obstructive sleep apnea (adult) (pediatric): Secondary | ICD-10-CM | POA: Diagnosis not present

## 2021-04-15 DIAGNOSIS — N184 Chronic kidney disease, stage 4 (severe): Secondary | ICD-10-CM

## 2021-04-15 DIAGNOSIS — Z8673 Personal history of transient ischemic attack (TIA), and cerebral infarction without residual deficits: Secondary | ICD-10-CM

## 2021-04-15 DIAGNOSIS — N131 Hydronephrosis with ureteral stricture, not elsewhere classified: Secondary | ICD-10-CM

## 2021-04-15 DIAGNOSIS — D508 Other iron deficiency anemias: Secondary | ICD-10-CM

## 2021-04-15 DIAGNOSIS — G4719 Other hypersomnia: Secondary | ICD-10-CM

## 2021-04-15 DIAGNOSIS — C649 Malignant neoplasm of unspecified kidney, except renal pelvis: Secondary | ICD-10-CM

## 2021-04-15 DIAGNOSIS — E0841 Diabetes mellitus due to underlying condition with diabetic mononeuropathy: Secondary | ICD-10-CM

## 2021-04-16 DIAGNOSIS — L820 Inflamed seborrheic keratosis: Secondary | ICD-10-CM | POA: Diagnosis not present

## 2021-04-16 DIAGNOSIS — I872 Venous insufficiency (chronic) (peripheral): Secondary | ICD-10-CM | POA: Diagnosis not present

## 2021-04-16 DIAGNOSIS — D1801 Hemangioma of skin and subcutaneous tissue: Secondary | ICD-10-CM | POA: Diagnosis not present

## 2021-04-16 DIAGNOSIS — I8312 Varicose veins of left lower extremity with inflammation: Secondary | ICD-10-CM | POA: Diagnosis not present

## 2021-04-16 DIAGNOSIS — L570 Actinic keratosis: Secondary | ICD-10-CM | POA: Diagnosis not present

## 2021-04-16 DIAGNOSIS — B354 Tinea corporis: Secondary | ICD-10-CM | POA: Diagnosis not present

## 2021-04-16 DIAGNOSIS — I8311 Varicose veins of right lower extremity with inflammation: Secondary | ICD-10-CM | POA: Diagnosis not present

## 2021-04-16 DIAGNOSIS — L821 Other seborrheic keratosis: Secondary | ICD-10-CM | POA: Diagnosis not present

## 2021-04-17 DIAGNOSIS — I10 Essential (primary) hypertension: Secondary | ICD-10-CM | POA: Diagnosis not present

## 2021-04-17 DIAGNOSIS — G4733 Obstructive sleep apnea (adult) (pediatric): Secondary | ICD-10-CM | POA: Insufficient documentation

## 2021-04-17 DIAGNOSIS — E1169 Type 2 diabetes mellitus with other specified complication: Secondary | ICD-10-CM | POA: Diagnosis not present

## 2021-04-17 DIAGNOSIS — N184 Chronic kidney disease, stage 4 (severe): Secondary | ICD-10-CM | POA: Diagnosis not present

## 2021-04-17 NOTE — Progress Notes (Signed)
? ? ? ?  ?  ?[facility] ?  ?HOME SLEEP TEST REPORT ( by Watch PAT)   ?STUDY DATE: 04-16-2021  ? ?  ?ORDERING CLINICIAN: Larey Seat, MD  ?REFERRING CLINICIAN: Stroke service, Sheliah Hatch  ?  ?CLINICAL INFORMATION/HISTORY: CVA,  Aphasia following cerebral infarction,Atherosclerotic heart disease of native coronary artery without angina pectoris ?Chronic kidney disease, stage 4 (severe) (HCC) ?Hypertensive chronic kidney disease with stage 1 through stage 4 chronic kidney disease, or unspecified chronic kidney disease ?Type 2 diabetes mellitus with diabetic chronic kidney disease ([place]) ?Long term (current) use of insulin ([place]) in Type 2 diabetes mellitus with diabetic neuropathy, unspecified ([place]) ? ?Hypothyroidism, unspecified ?Hyperlipidemia, unspecified ?Legal blindness, as defined in [place]. ?Previous baseline PSG recorded only 8 minutes of sleep.  ? ?  ?Epworth sleepiness score: 24/24. FSS at 35/63 points. Depression score  n/a ?  ?BMI: 31kg/m? ?  ?Neck Circumference: 16.5" ?  ?FINDINGS: ?  ?Sleep Summary: ?  ?Total Recording Time (hours, min): Total recording time by watch pat home sleep study amounted to 8 hours 19 minutes of which 7 hours and 3 minutes was a total recorded sleep time.    ?  ?Percent REM (%): 14.3%                                      ?  ?Respiratory Indices: ?  ?Calculated pAHI (per hour): 69.9/h                          ?  ?REM pAHI: 53/h                                             ?  ?NREM pAHI:   73.4/h                         ?  ?Positional AHI and snoring data failed to record.  The chest wall electrode did not transmit any data.                                               ?  ?Oxygen Saturation Statistics: ?  ?O2 Saturation Range (%): Between a nadir of 74% and a maximum 100% with a mean saturation at 93%                                    ?  ?O2 Saturation (minutes) <89%:   29.2 minutes ? ?O2 saturation minutes under 90%: 40 minutes, or 9.4% of total sleep time.      ?   ?Pulse Rate Statistics:              ?  ?Pulse Range: Between 38 and 121 bpm.  Please note that a home sleep test cannot give data about cardiac rhythms only the heart rate is reflected here.              ?  ?IMPRESSION:  This HST confirms the presence of severe sleep apnea with frequent brief oxygen desaturations, moderate  severe hypoxia, presumed to be obstructive apnea.   ?The patient's position during sleep and his snoring data were not captured. ?  ?RECOMMENDATION: Severe sleep apnea in conjunction with hypoxia may require positive airway pressure therapy. ?I am also concerned about the wide variability of heart rate, this is either tachybradycardia or tachybradycardia arrhythmia. ?In either case this patient would not be served by a dental device or an inspire implant device.  The patient had in the past used CPAP but was frustrated with a full facemask.  I like for him to be fitted with a chinstrap and nasal pillow, autotitration CPAP between 5 and 15 cmH2O with 2 cm EPR, heated humidification to be on auto setting.  Since this patient is vision impaired I would prefer him to be set up in a one-on-one session in the presence of a relative or caretaker. ? ?  ?INTERPRETING PHYSICIAN: ? ? Larey Seat, MD  ? ?Medical Director of [facility] [employer].  ? ? ? ? ? ? ? ? ? ? ? ? ? ? ? ? ? ? ? ?

## 2021-04-17 NOTE — Addendum Note (Signed)
Addended by: Larey Seat on: 04/17/2021 06:33 PM ? ? Modules accepted: Orders ? ?

## 2021-04-17 NOTE — Procedures (Signed)
[facility] ?  ?HOME SLEEP TEST REPORT ( by Watch PAT)   ?STUDY DATE: 04-16-2021  ? ?  ?ORDERING CLINICIAN: Larey Seat, MD  ?REFERRING CLINICIAN: Stroke service, Sheliah Hatch  ?  ?CLINICAL INFORMATION/HISTORY: CVA,  Aphasia following cerebral infarction,Atherosclerotic heart disease of native coronary artery without angina pectoris ?Chronic kidney disease, stage 4 (severe) (HCC) ?Hypertensive chronic kidney disease with stage 1 through stage 4 chronic kidney disease, or unspecified chronic kidney disease ?Type 2 diabetes mellitus with diabetic chronic kidney disease ([place]) ?Long term (current) use of insulin ([place]) in Type 2 diabetes mellitus with diabetic neuropathy, unspecified ([place]) ? ?Hypothyroidism, unspecified ?Hyperlipidemia, unspecified ?Legal blindness, as defined in [place]. ?Previous baseline PSG recorded only 8 minutes of sleep.  ? ?  ?Epworth sleepiness score: 24/24. FSS at 35/63 points. Depression score  n/a ?  ?BMI: 31kg/m? ?  ?Neck Circumference: 16.5" ?  ?FINDINGS: ?  ?Sleep Summary: ?  ?Total Recording Time (hours, min): Total recording time by watch pat home sleep study amounted to 8 hours 19 minutes of which 7 hours and 3 minutes was a total recorded sleep time.    ?  ?Percent REM (%): 14.3%                                      ?  ?Respiratory Indices: ?  ?Calculated pAHI (per hour): 69.9/h                          ?  ?REM pAHI: 53/h                                             ?  ?NREM pAHI:   73.4/h                         ?  ?Positional AHI and snoring data failed to record.  The chest wall electrode did not transmit any data.                                               ?  ?Oxygen Saturation Statistics: ?  ?O2 Saturation Range (%): Between a nadir of 74% and a maximum 100% with a mean saturation at 93%                                    ?  ?O2 Saturation (minutes) <89%:   29.2 minutes ? ?O2 saturation minutes under 90%: 40 minutes, or 9.4% of total sleep time.      ?  ?Pulse Rate  Statistics:              ?  ?Pulse Range: Between 38 and 121 bpm.  Please note that a home sleep test cannot give data about cardiac rhythms only the heart rate is reflected here.              ?  ?IMPRESSION:  This HST confirms the presence of severe sleep apnea with frequent brief oxygen desaturations, moderate severe hypoxia, presumed to be obstructive apnea.   ?  The patient's position during sleep and his snoring data were not captured. ?  ?RECOMMENDATION: Severe sleep apnea in conjunction with hypoxia may require positive airway pressure therapy. ?I am also concerned about the wide variability of heart rate, this is either tachybradycardia or tachybradycardia arrhythmia. ?In either case this patient would not be served by a dental device or an inspire implant device.  The patient had in the past used CPAP but was frustrated with a full facemask.  I like for him to be fitted with a chinstrap and nasal pillow, autotitration CPAP between 5 and 15 cmH2O with 2 cm EPR, heated humidification to be on auto setting.  Since this patient is vision impaired I would prefer him to be set up in a one-on-one session in the presence of a relative or caretaker. ? ?  ?INTERPRETING PHYSICIAN: ? ? Larey Seat, MD  ? ?Medical Director of [facility] at [employer].  ? ? ? ? ? ? ? ? ? ? ? ? ? ? ? ? ? ? ? ?

## 2021-04-17 NOTE — Progress Notes (Signed)
Calculated pAHI (per hour): 69.9/h, O2 saturation minutes under 90%: 40 minutes, or 9.4% of total sleep time.     Pulse Range: Between 38 and 121 bpm.      ? ?IMPRESSION:  This HST confirms the presence of severe sleep apnea with frequent brief oxygen desaturations, moderate severe hypoxia, presumed to be obstructive apnea.   ?The patient's position during sleep and his snoring data were not captured. ?? ?RECOMMENDATION: Severe sleep apnea in conjunction with hypoxia will require positive airway pressure therapy. ?(I am also concerned about the wide variability of heart rate, this is either tachybradycardia or tachybradycardia arrhythmia). ?In either case, this patient would not be served by a dental device or an inspire implant device.  ? The patient had in the past used CPAP but was frustrated with a full facemask.  I like for him to be fitted with a chinstrap and nasal pillow, autotitration CPAP between 5 and 15 cmH2O with 2 cm EPR, heated humidification , ResMed device /wifi compatible .  ? Since this patient is vision impaired he needs to be set up and fitted for mask in a one-on-one session in the presence of a relative or caretaker. ??

## 2021-04-18 ENCOUNTER — Encounter: Payer: Self-pay | Admitting: Neurology

## 2021-04-18 ENCOUNTER — Telehealth: Payer: Self-pay | Admitting: Neurology

## 2021-04-18 DIAGNOSIS — I10 Essential (primary) hypertension: Secondary | ICD-10-CM | POA: Diagnosis not present

## 2021-04-18 DIAGNOSIS — K219 Gastro-esophageal reflux disease without esophagitis: Secondary | ICD-10-CM | POA: Diagnosis not present

## 2021-04-18 DIAGNOSIS — N184 Chronic kidney disease, stage 4 (severe): Secondary | ICD-10-CM | POA: Diagnosis not present

## 2021-04-18 DIAGNOSIS — E785 Hyperlipidemia, unspecified: Secondary | ICD-10-CM | POA: Diagnosis not present

## 2021-04-18 DIAGNOSIS — E039 Hypothyroidism, unspecified: Secondary | ICD-10-CM | POA: Diagnosis not present

## 2021-04-18 DIAGNOSIS — E1169 Type 2 diabetes mellitus with other specified complication: Secondary | ICD-10-CM | POA: Diagnosis not present

## 2021-04-18 NOTE — Telephone Encounter (Signed)
I called pt. I advised pt that Dr. Brett Fairy reviewed their sleep study results and found that pt has severe sleep apnea. Dr. Brett Fairy recommends that pt starts auto CPAP. I reviewed PAP compliance expectations with the pt. Pt is agreeable to starting a CPAP. I advised pt that an order will be sent to a DME, [facility], and [facility] will call the pt within about one week after they file with the pt's insurance. [facility] will show the pt how to use the machine, fit for masks, and troubleshoot the CPAP if needed. A follow up appt was made for insurance purposes with Frann Rider on 3:45 pm. Pt verbalized understanding to arrive 15 minutes early and bring their CPAP. A letter with all of this information in it will be mailed to the pt as a reminder. I verified with the pt that the address we have on file is correct. Pt verbalized understanding of results. Pt had no questions at this time but was encouraged to call back if questions arise. I have sent the order to [facility] and have received confirmation that they have received the order. ? ?

## 2021-04-18 NOTE — Telephone Encounter (Signed)
-----   Message from Larey Seat, MD sent at 04/17/2021  6:33 PM EDT ----- ?IMPRESSION:  This HST confirms the presence of severe sleep apnea with frequent brief oxygen desaturations, moderate severe hypoxia, presumed to be obstructive apnea.   ?The patient's position during sleep and his snoring data were not captured. ?? ?RECOMMENDATION: Severe sleep apnea in conjunction with hypoxia will require positive airway pressure therapy. ?I am also concerned about the wide variability of heart rate, this is either tachybradycardia or tachybradycardia arrhythmia. ?In either case this patient would not be served by a dental device or an inspire implant device.   ? ?The patient had in the past used CPAP but was frustrated with a full facemask.  I like for him to be fitted with a chinstrap and nasal pillow, autotitration CPAP between 5 and 15 cmH2O with 2 cm EPR, heated humidification to be on auto setting.  Since this patient is vision impaired he is to be set up in a one-on-one session in the presence of a relative or caretaker. ?? ?

## 2021-04-23 DIAGNOSIS — G4733 Obstructive sleep apnea (adult) (pediatric): Secondary | ICD-10-CM | POA: Diagnosis not present

## 2021-04-24 ENCOUNTER — Telehealth: Payer: Self-pay | Admitting: Neurology

## 2021-04-24 NOTE — Telephone Encounter (Signed)
Pt was scheduled for Initial CPAP visit on 06/06/21 ?Pt was informed to bring machine and power cord to appt. ?DME: [facility] ?Phone: [phone] ?Fax: [phone] ?Equipment issued: Resmed F20 large on 04/23/21 ?

## 2021-04-25 ENCOUNTER — Ambulatory Visit: Payer: Medicare Other | Admitting: Podiatry

## 2021-04-25 ENCOUNTER — Encounter: Payer: Self-pay | Admitting: Podiatry

## 2021-04-25 DIAGNOSIS — M79676 Pain in unspecified toe(s): Secondary | ICD-10-CM

## 2021-04-25 DIAGNOSIS — I2510 Atherosclerotic heart disease of native coronary artery without angina pectoris: Secondary | ICD-10-CM | POA: Insufficient documentation

## 2021-04-25 DIAGNOSIS — B351 Tinea unguium: Secondary | ICD-10-CM

## 2021-04-25 DIAGNOSIS — N2581 Secondary hyperparathyroidism of renal origin: Secondary | ICD-10-CM | POA: Insufficient documentation

## 2021-04-25 DIAGNOSIS — Z794 Long term (current) use of insulin: Secondary | ICD-10-CM | POA: Insufficient documentation

## 2021-04-25 DIAGNOSIS — K219 Gastro-esophageal reflux disease without esophagitis: Secondary | ICD-10-CM | POA: Insufficient documentation

## 2021-04-25 DIAGNOSIS — E785 Hyperlipidemia, unspecified: Secondary | ICD-10-CM | POA: Insufficient documentation

## 2021-04-25 DIAGNOSIS — K5900 Constipation, unspecified: Secondary | ICD-10-CM | POA: Insufficient documentation

## 2021-04-25 DIAGNOSIS — G629 Polyneuropathy, unspecified: Secondary | ICD-10-CM | POA: Insufficient documentation

## 2021-04-25 DIAGNOSIS — H401130 Primary open-angle glaucoma, bilateral, stage unspecified: Secondary | ICD-10-CM | POA: Insufficient documentation

## 2021-04-25 DIAGNOSIS — G4700 Insomnia, unspecified: Secondary | ICD-10-CM | POA: Insufficient documentation

## 2021-04-25 NOTE — Progress Notes (Signed)
Right presents today chief complaint of painful elongated toenails. ? ?Objective: Toenails are long thick yellow dystrophic onychomycotic.  Pulses remain palpable no open lesions or wounds. ? ?Assessment: Pain in limb secondary onychomycosis. ? ?Plan: Debridement of toenails 1 through 5 bilateral. ?

## 2021-05-24 DIAGNOSIS — G4733 Obstructive sleep apnea (adult) (pediatric): Secondary | ICD-10-CM | POA: Diagnosis not present

## 2021-06-05 ENCOUNTER — Ambulatory Visit: Payer: Medicare Other | Admitting: Adult Health

## 2021-06-05 ENCOUNTER — Telehealth: Payer: Self-pay | Admitting: Family Medicine

## 2021-06-05 ENCOUNTER — Encounter: Payer: Self-pay | Admitting: Adult Health

## 2021-06-05 VITALS — BP 118/60 | HR 60 | Ht 71.0 in | Wt 230.0 lb

## 2021-06-05 DIAGNOSIS — R2689 Other abnormalities of gait and mobility: Secondary | ICD-10-CM

## 2021-06-05 DIAGNOSIS — G4730 Sleep apnea, unspecified: Secondary | ICD-10-CM | POA: Diagnosis not present

## 2021-06-05 DIAGNOSIS — I639 Cerebral infarction, unspecified: Secondary | ICD-10-CM

## 2021-06-05 NOTE — Telephone Encounter (Signed)
Referral for Physical Therapy sent to [facility] [phone]. ?

## 2021-06-05 NOTE — Patient Instructions (Addendum)
Continue nightly use of CPAP - will make some adjustments to your settings as you still have a higher level amount of apneas  ? ?Will refer to outpatient physical therapy at [facility] for imbalance and gait difficulties  ? ?Continue aspirin 81 mg daily  and atorvastatin  for secondary stroke prevention ? ?Continue to follow up with PCP regarding cholesterol and blood pressure management  ?Maintain strict control of hypertension with blood pressure goal below 130/90 and cholesterol with LDL cholesterol (bad cholesterol) goal below 70 mg/dL.  ? ?Signs of a Stroke? Follow the BEFAST method:  ?Balance Watch for a sudden loss of balance, trouble with coordination or vertigo ?Eyes Is there a sudden loss of vision in one or both eyes? Or double vision?  ?Face: Ask the person to smile. Does one side of the face droop or is it numb?  ?Arms: Ask the person to raise both arms. Does one arm drift downward? Is there weakness or numbness of a leg? ?Speech: Ask the person to repeat a simple phrase. Does the speech sound slurred/strange? Is the person confused ? ?Time: If you observe any of these signs, call 911. ? ? ? ? ? ?Followup in the future with me in 6 months or call earlier if needed ? ? ? ? ? ? ?Thank you for coming to see [place] at [facility]. I hope we have been able to provide you high quality care today. ? ?You may receive a patient satisfaction survey over the next few weeks. We would appreciate your feedback and comments so that we may continue to improve ourselves and the health of our patients. ? ?

## 2021-06-05 NOTE — Progress Notes (Signed)
?[facility] ?[address] ?[place]. [place] [address] ?[phone] ? ?     OFFICE FOLLOW UP NOTE ? ?[name] ?Date of Birth:  07-23-1925 ?Medical Record Number:  [id number]  ? ? ?Primary neurologist: Dr. Leonie Man ?Sleep neurologist: Dr. Brett Fairy ?Reason for Referral: Initial CPAP follow-up and stroke follow-up ? ? ? ?SUBJECTIVE: ? ? ?CHIEF COMPLAINT:  ?Chief Complaint  ?Patient presents with  ? Follow-up  ?  Rm 3 with daughter here for initial CPAP visit. Reports he has been doing well    ? ? ?HPI:  ? ?Update 06/05/2021 JM: Patient returns for initial CPAP follow-up visit as well as stroke follow-up. ? ?Stroke:  ?Overall stable without new stroke/TIA symptoms.  Cognition has been stable since prior visit.  Daughter believes word finding difficulty and hesitation has improved since prior visit. Daughter concerned regarding gradual decline of ambulation/balance and occasionally knees giving out. He does have neuropathy.  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  Was working with [facility] back in December for BPPV, he will occasionally do Epley maneuver but feels this may worsen vertigo. he ambulates with a cane. Continues to maintain ADLs independently, daughters assist with IADLs.  Compliant on aspirin and atorvastatin, denies side effects.  Blood pressure today 118/60.  ? ? ?Sleep apnea:  ?Evaluated by Dr. Brett Fairy 1/26 with prior history of sleep apnea.  Initially underwent split-night study on 2/5 but only reported 8.5 minutes of sleep therefore proceeded with HST.  HST completed 3/20 which showed calculated AHI 69.9/h consistent with severe sleep apnea in conjunction with hypoxia and recommend initiating AutoPap.  AutoPap initiated 3/28. Initially had difficulty tolerating full face mask, now using hybrid mask with improvement of tolerance.  Reports improvement of sleep quality and some increase energy during the day since starting.  Epworth Sleepiness Scale  20/24 (before start 20/24).  Fatigue severity scale 37/63 (before start 35/63). ? ? ? ? ? ? ? ? ? ? ? ?History provided for reference purposes only ?Initial visit 11/26/2020 JM: Patient being seen for hospital follow up accompanied by his daughter. Overall stable since discharge. She has since returned back to his own home living independently (stayed with family for 1-2 weeks after discharge). He has since returned back to all prior activities. Maintains ADLs independently - does have maid for more intense cleaning, eats out a lot but will cook simple meals, he reports paying his own bills (although per daughter, his other daughter recently started assisting with this), has not yet returned back to driving (he was driving locally prior). Denies new or reoccurring stroke/TIA symptoms. Compliant on aspirin and atorvastatin without side effects. Blood pressure today 122/68.  ? ? ?Daughter has multiple other concerns today:  ? ?Drooling - present prior to recent stroke but questions worsening after recent stroke. Questions possible difficulty swallowing but does have hx of esophageal stricture needing stretching in the past ? ?BUE Tremor present prior to stroke - not constant - usually occurs when he tries to write or hold an object. No tremors at rest.  ? ?Mild cognitive impairment - chronic. no worsening since stroke, short term memory loss main concern ? ?Insomnia - chronic. Takes benadryl nightly (as advised by his granddaughter who is a Software engineer); hx of sleep apnea intolerant to CPAP - no use over the past 2 years ? ?Occasional word finding difficulty - chronic. daughter believes worsening since recent stroke ? ?On ROS sheet which was completed by daughters: Fatigue, blurred  vision, loss of vision, easy bruising, easy bleeding, palpitations, swelling of legs, snoring, feeling cold, ringing in ears, spinning sensation, trouble swallowing, constipation, joint pain, rash, urination problems, incontinence,  incontinence, allergies, skin sensitivity, memory loss, weakness, dizziness, tremor, insomnia, sleepiness, snoring, depression, too much sleep, not enough sleep, decreased energy, disinterest in activities and racing thoughts ? ? ?Stroke admission 10/07/2020 ?[name] is a 88 y.o. male with history of left eye blindness, cancer (left kidney), arthritis, cataract, CKD, DM, glaucoma, HTN, hx of vertigo, neuropathy and prior stroke who presented on 10/07/2020 from home with acute onset of expressive dysphasia (speaking and texting) that started around 8:30am initially resolved but en route to ED via EMS, reoccurrence of expressive aphasia "could not get words out" in conjunction with right facial droop which both rapidly resolved.  Personally reviewed hospitalization pertinent progress notes, lab work and imaging.  Evaluated by Dr. Leonie Man.  Stroke work-up revealed right precentral gyrus infarct embolic possibly secondary to cardioembolic source such as atrial fibrillation (hx documented on neuro note but not other mention of prior hx).  Baseline mild cognitive impairment as well as fall risk with multiple falls and lives alone but medication noncompliance and deemed to not be a good AC candidate there further monitoring not indicated.  Carotid Dopplers no significant stenosis.  EF 50 to 55%.  LDL 13.  A1c 9.3.  On aspirin 81 mg PTA although missed doses frequently and advised to restart aspirin 81 mg daily as well as continuation of atorvastatin 80 mg daily.  HTN stable on amlodipine.  Therapy eval's recommended HH therapies (chronic dizziness/vertigo, with 24-hour supervision and d/c'd to stay with his daughter until further recovery.  ? ? ? ? ? ? ?PERTINENT IMAGING ? ?MR BRAIN 10/07/2020 ?IMPRESSION: ?1. Small acute infarcts in the right precentral gyrus and the ?anterior right frontal white matter. No significant edema or mass ?effect. ?2. Remote infarcts in the right inferior cerebellum and right ?temporal  lobe. ?3. Mild for age chronic microvascular ischemic disease and atrophy. ? ?MRA HEAD 10/07/2020 ?IMPRESSION: ?1. Artifact rather than ELVO suspected at the Left MCA trifurcation, ?with MRI pending at this time. ?But if DWI is positive in the Left MCA territory then an LVO can not ?be excluded and should be considered. Follow-up CTA Head may be ?valuable in that scenario. ?2. Otherwise negative for age intracranial MRA. ? ?2D ECHO 10/07/2020 ?IMPRESSIONS  ? 1. Left ventricular ejection fraction, by estimation, is 50 to 55%. The  ?left ventricle has low normal function. The left ventricle has no regional  ?wall motion abnormalities. Left ventricular diastolic parameters are  ?consistent with Grade I diastolic  ?dysfunction (impaired relaxation).  ? 2. Right ventricular systolic function is normal. The right ventricular  ?size is normal.  ? 3. Left atrial size was severely dilated.  ? 4. The mitral valve is normal in structure. No evidence of mitral valve  ?regurgitation. No evidence of mitral stenosis.  ? 5. The aortic valve is tricuspid. There is mild calcification of the  ?aortic valve. There is mild thickening of the aortic valve. Aortic valve  ?regurgitation is trivial. Mild aortic valve sclerosis is present, with no  ?evidence of aortic valve stenosis.  ?Aortic valve area, by VTI measures 3.54 cm?Marland Kitchen Aortic valve mean gradient  ?measures 4.0 mmHg. Aortic valve Vmax measures 1.22 m/s.  ? 6. Aortic dilatation noted. 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  ? 7. The  inferior vena cava is normal in size with greater than 50%  ?respiratory variability, suggesting right atrial pressure of 3 mmHg.  ? ? ? ? ? ? ?ROS:   ?14 system review of systems performed and negative with exception of those listed in HPI ? ?PMH:  ?Past Medical History:  ?Diagnosis Date  ? Arthritis   ? Blind left eye   ? Cancer of left kidney ([place])   ? Cataract   ? Chronic kidney disease   ?  Constipation   ? Diabetes mellitus without complication ([place])   ? Glaucoma   ? Hypertension   ? Neuropathy   ? Stroke [facility])   ? ? ?PSH:  ?Past Surgical History:  ?Procedure Laterality Date  ? CHOLECYSTECTOMY    ? LE

## 2021-06-06 ENCOUNTER — Encounter: Payer: Medicare Other | Admitting: Adult Health

## 2021-06-07 DIAGNOSIS — E039 Hypothyroidism, unspecified: Secondary | ICD-10-CM | POA: Diagnosis not present

## 2021-06-07 DIAGNOSIS — N184 Chronic kidney disease, stage 4 (severe): Secondary | ICD-10-CM | POA: Diagnosis not present

## 2021-06-07 DIAGNOSIS — I10 Essential (primary) hypertension: Secondary | ICD-10-CM | POA: Diagnosis not present

## 2021-06-07 DIAGNOSIS — E785 Hyperlipidemia, unspecified: Secondary | ICD-10-CM | POA: Diagnosis not present

## 2021-06-07 DIAGNOSIS — E1169 Type 2 diabetes mellitus with other specified complication: Secondary | ICD-10-CM | POA: Diagnosis not present

## 2021-06-10 ENCOUNTER — Telehealth: Payer: Self-pay | Admitting: Adult Health

## 2021-06-10 DIAGNOSIS — G4730 Sleep apnea, unspecified: Secondary | ICD-10-CM

## 2021-06-10 NOTE — Addendum Note (Signed)
Addended by: Frann Rider L on: 06/10/2021 04:46 PM ? ? Modules accepted: Orders ? ?

## 2021-06-10 NOTE — Telephone Encounter (Signed)
Contacted pt back, he states the air pressure comes out the side of mask and has air blowing in his eyes waking him up in the middle of the night. May need to readjust pressure? Please advise  ?

## 2021-06-10 NOTE — Telephone Encounter (Signed)
Pt said since adjusting pressure, machine wakes pt up at 4:00 am. Pt can not go back to sleep. Mask unable to seal after waking up that early in the morning. Would like a call from the nurse. ?

## 2021-06-10 NOTE — Telephone Encounter (Signed)
Would recommend proceeding with titration study if he has difficulty tolerating increased pressure as on lower pressures, his apnea was not adequately treated. Please advise patient that he will be called to schedule this in lab titration study.  Thank you. ?

## 2021-06-10 NOTE — Telephone Encounter (Signed)
Patient is scheduled with [facility] ?

## 2021-06-11 DIAGNOSIS — R293 Abnormal posture: Secondary | ICD-10-CM | POA: Diagnosis not present

## 2021-06-11 DIAGNOSIS — R2681 Unsteadiness on feet: Secondary | ICD-10-CM | POA: Diagnosis not present

## 2021-06-11 DIAGNOSIS — M6281 Muscle weakness (generalized): Secondary | ICD-10-CM | POA: Diagnosis not present

## 2021-06-11 DIAGNOSIS — R5381 Other malaise: Secondary | ICD-10-CM | POA: Diagnosis not present

## 2021-06-11 NOTE — Telephone Encounter (Signed)
Contacted pt, informed him of jessica's recommendations and he was appreciative.  ?

## 2021-06-13 DIAGNOSIS — M6281 Muscle weakness (generalized): Secondary | ICD-10-CM | POA: Diagnosis not present

## 2021-06-13 DIAGNOSIS — R293 Abnormal posture: Secondary | ICD-10-CM | POA: Diagnosis not present

## 2021-06-13 DIAGNOSIS — R2681 Unsteadiness on feet: Secondary | ICD-10-CM | POA: Diagnosis not present

## 2021-06-13 DIAGNOSIS — R5381 Other malaise: Secondary | ICD-10-CM | POA: Diagnosis not present

## 2021-06-23 DIAGNOSIS — G4733 Obstructive sleep apnea (adult) (pediatric): Secondary | ICD-10-CM | POA: Diagnosis not present

## 2021-06-27 NOTE — Telephone Encounter (Signed)
Order placed for titration study as his apnea was not well controlled on prior download and having difficulty tolerating increased pressure. Please advise patient of indication for titration study and would highly recommend pursing. Thank you.

## 2021-06-27 NOTE — Telephone Encounter (Signed)
I spoke with the patient he does not want to proceed on having the sleep study at this time. He stated he has had 2 sleep studies before and he knows he has Sleep Apnea, he stated he does not know where to go from here.

## 2021-06-27 NOTE — Telephone Encounter (Signed)
I contacted the pt and we had an extended conversation I also pulled compliance data with the pt as follows:  Upon review of the last 30 day report I discussed with the pt how his AHI is not within the normal range and places him within the moderate OSA range. I discussed the benefits of the titration study as requested by Janett Billow and how this study would help [place] better understand what pressure and machine setting would best treat his apnea. Pt was advised based of the data report the current setting is not treating his apnea effectively and the titration study would be very beneficial. I also discussed this report with the NP briefly will the pt held on the phone. Pt was agreeable to titration study and appreciated the information will update sleep lab and NP.

## 2021-07-01 ENCOUNTER — Ambulatory Visit (INDEPENDENT_AMBULATORY_CARE_PROVIDER_SITE_OTHER): Payer: Medicare Other | Admitting: Neurology

## 2021-07-01 DIAGNOSIS — G4731 Primary central sleep apnea: Secondary | ICD-10-CM

## 2021-07-01 DIAGNOSIS — R2681 Unsteadiness on feet: Secondary | ICD-10-CM | POA: Diagnosis not present

## 2021-07-01 DIAGNOSIS — R293 Abnormal posture: Secondary | ICD-10-CM | POA: Diagnosis not present

## 2021-07-01 DIAGNOSIS — I639 Cerebral infarction, unspecified: Secondary | ICD-10-CM

## 2021-07-01 DIAGNOSIS — R5381 Other malaise: Secondary | ICD-10-CM | POA: Diagnosis not present

## 2021-07-01 DIAGNOSIS — G4730 Sleep apnea, unspecified: Secondary | ICD-10-CM

## 2021-07-01 DIAGNOSIS — M6281 Muscle weakness (generalized): Secondary | ICD-10-CM | POA: Diagnosis not present

## 2021-07-01 NOTE — Telephone Encounter (Signed)
Noted, spoke to the patient he is scheduled for tonight 07/01/21 at 8 pm. [facility] medicare no Josem Kaufmann req

## 2021-07-02 DIAGNOSIS — E039 Hypothyroidism, unspecified: Secondary | ICD-10-CM | POA: Diagnosis not present

## 2021-07-02 DIAGNOSIS — N184 Chronic kidney disease, stage 4 (severe): Secondary | ICD-10-CM | POA: Diagnosis not present

## 2021-07-02 DIAGNOSIS — I2510 Atherosclerotic heart disease of native coronary artery without angina pectoris: Secondary | ICD-10-CM | POA: Diagnosis not present

## 2021-07-02 DIAGNOSIS — E1169 Type 2 diabetes mellitus with other specified complication: Secondary | ICD-10-CM | POA: Diagnosis not present

## 2021-07-02 DIAGNOSIS — I10 Essential (primary) hypertension: Secondary | ICD-10-CM | POA: Diagnosis not present

## 2021-07-02 DIAGNOSIS — E785 Hyperlipidemia, unspecified: Secondary | ICD-10-CM | POA: Diagnosis not present

## 2021-07-03 DIAGNOSIS — R293 Abnormal posture: Secondary | ICD-10-CM | POA: Diagnosis not present

## 2021-07-03 DIAGNOSIS — R5381 Other malaise: Secondary | ICD-10-CM | POA: Diagnosis not present

## 2021-07-03 DIAGNOSIS — R2681 Unsteadiness on feet: Secondary | ICD-10-CM | POA: Diagnosis not present

## 2021-07-03 DIAGNOSIS — M6281 Muscle weakness (generalized): Secondary | ICD-10-CM | POA: Diagnosis not present

## 2021-07-08 DIAGNOSIS — R2681 Unsteadiness on feet: Secondary | ICD-10-CM | POA: Diagnosis not present

## 2021-07-08 DIAGNOSIS — M6281 Muscle weakness (generalized): Secondary | ICD-10-CM | POA: Diagnosis not present

## 2021-07-08 DIAGNOSIS — R293 Abnormal posture: Secondary | ICD-10-CM | POA: Diagnosis not present

## 2021-07-08 DIAGNOSIS — R5381 Other malaise: Secondary | ICD-10-CM | POA: Diagnosis not present

## 2021-07-10 DIAGNOSIS — R5381 Other malaise: Secondary | ICD-10-CM | POA: Diagnosis not present

## 2021-07-10 DIAGNOSIS — R293 Abnormal posture: Secondary | ICD-10-CM | POA: Diagnosis not present

## 2021-07-10 DIAGNOSIS — M6281 Muscle weakness (generalized): Secondary | ICD-10-CM | POA: Diagnosis not present

## 2021-07-10 DIAGNOSIS — R2681 Unsteadiness on feet: Secondary | ICD-10-CM | POA: Diagnosis not present

## 2021-07-14 DIAGNOSIS — G4731 Primary central sleep apnea: Secondary | ICD-10-CM | POA: Insufficient documentation

## 2021-07-14 NOTE — Procedures (Signed)
BIPAP titration: PATIENT'S NAME:  Martin Richardson, Martin Richardson DOB:      1926-01-28      MR#:    [id number]     DATE OF RECORDING: 07/01/2021 REFERRING M.D.:  Frann Rider, NP/ Stroke Team  Study Performed:   CPAP  Titration HISTORY:  here for attended titration study with new mask fitting. Chinstrap added.   Martin Richardson is a 88 y.o. Caucasian male patient seen here as a referral on 02/21/2021 from Stroke Team NP  for a sleep apnea evaluation. .  Evaluated by Dr. Brett Fairy 1/26 with prior history of sleep apnea/ He was diagnosed with apnea in [place] and was not having follow up, developed problems with CPAP, FFM left bleeding blisters on outside of the mouth, chin strap, he was frustrated.   It has been more recently that he has had problems with going to sleep and staying asleep. He is staying up till midnight watching acc basketball and some stressful. He stopped about 2019. Initially underwent split-night study on 2/5 but only reported 8.5 minutes of sleep therefore proceeded with HST.  HST by watch pat completed 3/20 which showed Calculated pAHI (per hour): 69.9/h                           REM pAHI: 53/h                                             NREM pAHI:   73.4/h                         Auto Pap initiated 3/28.residual AHI was 19.8/h. Initially had difficulty tolerating full face mask, now using hybrid mask with improvement of tolerance.  Reports improvement of sleep quality and some increase energy during the day since starting.  Epworth Sleepiness Scale 20/24 (before start 20/24).  Fatigue severity scale 37/63 (before start 35/63).  The patient's weight 230 pounds with a height of 71 (inches), resulting in a BMI of 32.1 kg/m2. The patient's neck circumference measured 16.5 inches.  CURRENT MEDICATIONS: Tylenol, Norvasc, Aspirin, Lipitor, Valisone, Vitamin D3, Benadryl, Colace, Pepcid, Neurontin, Synthroid, Xylocaine, Protonix, Align, Flomax, Timoptic, Kenalog    PROCEDURE:  This is a  multichannel digital polysomnogram utilizing the SomnoStar 11.2 system.  Electrodes and sensors were applied and monitored per AASM Specifications.   EEG, EOG, Chin and Limb EMG, were sampled at 200 Hz.  ECG, Snore and Nasal Pressure, Thermal Airflow, Respiratory Effort, CPAP Flow and Pressure, Oximetry was sampled at 50 Hz. Digital video and audio were recorded.       CPAP was initiated under a Simplus FFM in medium size, with a chin strap later added.  Beginning at 60 cmH20 with heated humidity per AASM standards, this PAP pressure was advanced to 15 cmH20 because of hypopneas, apneas, and desaturations. EPR of 3 cm was added and patient was transferred to BiPAP- beginning at 17/13 cm water, titrated up to 22/18 cm water- with the best result seen at   BiPAP pressure of 21/ 17 cmH20, achieving a reduction of the AHI to 3.0 /h over a sleep time of 70 minutes with improvement of sleep apnea.  Lights Out was at 20:47 and Lights On at 04:52. Total recording time (TRT) was 480 minutes, with a total sleep time (TST)  of 306.5 minutes. The patient's sleep latency was 48.5 minutes. REM latency was 123.5 minutes.  The sleep efficiency was 63.9 %.    SLEEP ARCHITECTURE: WASO (Wake after sleep onset) was high at 142.5 minutes.   There were 28.5 minutes in Stage N1, 193.5 minutes Stage N2, 60.5 minutes Stage N3 and 24 minutes in Stage REM.  The percentage of Stage N1 was 9.3%, Stage N2 was 63.1%, Stage N3 was 19.7% and Stage R (REM sleep) was 7.8%.  RESPIRATORY ANALYSIS:  There was a total of 63 respiratory events:  4 obstructive apneas, 10 central apneas and 0 mixed apneas with a total of 14 apneas and an apnea index (AI) of 2.7 /hour. There were 49 hypopneas with a hypopnea index of 9.6/hour.   The total APNEA/HYPOPNEA INDEX  (AHI) was 12.3 /hour . 1 event occurred in REM sleep and 62 events in NREM. The REM AHI was 2.5 /hour versus a non-REM AHI of 13.2 /hour.   The patient spent 306.5 minutes of total  sleep time in the supine position and 0 minutes in non-supine. The supine AHI was 12.3, versus a non-supine AHI of 0.0. OXYGEN SATURATION & C02:  The baseline 02 saturation was 94%, with the lowest being 86%. Time spent below 89% saturation equaled 1 minute.  PERIODIC LIMB MOVEMENTS:  The patient had a total of 41 Periodic Limb Movements. The Periodic Limb Movement (PLM) Arousal index was .2 /hour. The arousals were noted as: 66 were spontaneous, 1 were associated with PLMs, 19 were associated with respiratory events.  Audio and video analysis did not show any abnormal or unusual movements, behaviors, phonations or vocalizations.    DIAGNOSIS Central Sleep Apnea, obstructive events and fragmentation of sleep improved under BiPAP of 21/17 cm water, with an AHI of 3/h and nadir 02 of 90%. The patient will use a [employer] SIMPLUS FFM in medium size with chin strap.   PLANS/RECOMMENDATIONS: Please provide BiPAP with heated humidification and a pressure of 21/17 cm water, with a prolonged RAMP function. The patient will use a [employer] SIMPLUS FFM in medium size with chin strap. CPAP therapy compliance is defined as 4 hours of nightly use.  DISCUSSION: Follow up with primary NP within 60-90 days of BiPAP therapy.  A follow up appointment will be scheduled with the NP in the Sleep Clinic at [facility].   Please call [phone] with any questions.      I certify that I have reviewed the entire raw data recording prior to the issuance of this report in accordance with the Standards of Accreditation of the American Academy of Sleep Medicine (AASM)      Larey Seat, M.D. Diplomat, Tax adviser of  Neurology  Diplomat, Tax adviser of Sleep Medicine Market researcher, [employer] [facility] at [employer]

## 2021-07-15 ENCOUNTER — Telehealth: Payer: Self-pay | Admitting: Adult Health

## 2021-07-15 DIAGNOSIS — G4730 Sleep apnea, unspecified: Secondary | ICD-10-CM

## 2021-07-15 DIAGNOSIS — Z9989 Dependence on other enabling machines and devices: Secondary | ICD-10-CM

## 2021-07-15 NOTE — Telephone Encounter (Signed)
Orders sent to [facility] via [place] confirmed receipt.

## 2021-07-15 NOTE — Telephone Encounter (Signed)
Completed titration study 07/01/2021   Per Dr. Brett Fairy "Please provide BiPAP with heated humidification and a pressure of 21/17 cm water, with a prolonged RAMP function. The patient will use a [employer] SIMPLUS FFM in medium size with chin strap. "  Order will be placed. Please schedule f/u visit in 60 to 90 days after starting BiPAP therapy

## 2021-07-15 NOTE — Telephone Encounter (Signed)
I called pt. I advised pt that Janett Billow reviewed their sleep study results and found that pt did well during most recent sleep study while on BiPAP. Janett Billow recommends that pt start BiPAP therapy for treatment. I reviewed PAP compliance expectations with the pt. Pt is agreeable to starting a BiPAP. I advised pt that an order will be sent to a DME, [facility], and [facility] will call the pt within about one week after they file with the pt's insurance. [facility] will show the pt how to use the machine, fit for masks, and troubleshoot the BiPAP if needed. I will call within the next few weeks to check status on this and get pt scheduled for 61-90 day follow up.  Pt will call back with any questions/concerns.

## 2021-07-24 DIAGNOSIS — I10 Essential (primary) hypertension: Secondary | ICD-10-CM | POA: Diagnosis not present

## 2021-07-24 DIAGNOSIS — R293 Abnormal posture: Secondary | ICD-10-CM | POA: Diagnosis not present

## 2021-07-24 DIAGNOSIS — M6281 Muscle weakness (generalized): Secondary | ICD-10-CM | POA: Diagnosis not present

## 2021-07-24 DIAGNOSIS — R5381 Other malaise: Secondary | ICD-10-CM | POA: Diagnosis not present

## 2021-07-24 DIAGNOSIS — R2681 Unsteadiness on feet: Secondary | ICD-10-CM | POA: Diagnosis not present

## 2021-07-24 DIAGNOSIS — E1169 Type 2 diabetes mellitus with other specified complication: Secondary | ICD-10-CM | POA: Diagnosis not present

## 2021-07-26 DIAGNOSIS — R5381 Other malaise: Secondary | ICD-10-CM | POA: Diagnosis not present

## 2021-07-26 DIAGNOSIS — R293 Abnormal posture: Secondary | ICD-10-CM | POA: Diagnosis not present

## 2021-07-26 DIAGNOSIS — R2681 Unsteadiness on feet: Secondary | ICD-10-CM | POA: Diagnosis not present

## 2021-07-26 DIAGNOSIS — M6281 Muscle weakness (generalized): Secondary | ICD-10-CM | POA: Diagnosis not present

## 2021-07-29 DIAGNOSIS — R5381 Other malaise: Secondary | ICD-10-CM | POA: Diagnosis not present

## 2021-07-29 DIAGNOSIS — M6281 Muscle weakness (generalized): Secondary | ICD-10-CM | POA: Diagnosis not present

## 2021-07-29 DIAGNOSIS — R293 Abnormal posture: Secondary | ICD-10-CM | POA: Diagnosis not present

## 2021-07-29 DIAGNOSIS — R2681 Unsteadiness on feet: Secondary | ICD-10-CM | POA: Diagnosis not present

## 2021-07-31 ENCOUNTER — Encounter: Payer: Self-pay | Admitting: Podiatry

## 2021-07-31 ENCOUNTER — Ambulatory Visit: Payer: Medicare Other | Admitting: Podiatry

## 2021-07-31 DIAGNOSIS — E119 Type 2 diabetes mellitus without complications: Secondary | ICD-10-CM

## 2021-07-31 DIAGNOSIS — M79676 Pain in unspecified toe(s): Secondary | ICD-10-CM

## 2021-07-31 DIAGNOSIS — M2041 Other hammer toe(s) (acquired), right foot: Secondary | ICD-10-CM

## 2021-07-31 DIAGNOSIS — M2012 Hallux valgus (acquired), left foot: Secondary | ICD-10-CM

## 2021-07-31 DIAGNOSIS — M2042 Other hammer toe(s) (acquired), left foot: Secondary | ICD-10-CM

## 2021-07-31 DIAGNOSIS — E1142 Type 2 diabetes mellitus with diabetic polyneuropathy: Secondary | ICD-10-CM | POA: Diagnosis not present

## 2021-07-31 DIAGNOSIS — M2011 Hallux valgus (acquired), right foot: Secondary | ICD-10-CM

## 2021-07-31 DIAGNOSIS — B351 Tinea unguium: Secondary | ICD-10-CM | POA: Diagnosis not present

## 2021-07-31 NOTE — Patient Instructions (Signed)
Diabetes Mellitus and Foot Care Foot care is an important part of your health, especially when you have diabetes. Diabetes may cause you to have problems because of poor blood flow (circulation) to your feet and legs, which can cause your skin to: Become thinner and drier. Break more easily. Heal more slowly. Peel and crack. You may also have nerve damage (neuropathy) in your legs and feet, causing decreased feeling in them. This means that you may not notice minor injuries to your feet that could lead to more serious problems. Noticing and addressing any potential problems early is the best way to prevent future foot problems. How to care for your feet Foot hygiene Wash your feet daily with warm water and mild soap. Do not use hot water. Then, pat your feet and the areas between your toes until they are completely dry. Do not soak your feet as this can dry your skin. Apply a moisturizing lotion or petroleum jelly to the skin on your feet and to dry, brittle toenails. Use lotion that does not contain alcohol and is unscented. Do not apply lotion between your toes. Shoes and socks Wear clean socks or stockings every day. Make sure they are not too tight. Do not wear knee-high stockings since they may decrease blood flow to your legs. Wear shoes that fit properly and have enough cushioning. Always look in your shoes before you put them on to be sure there are no objects inside. To break in new shoes, wear them for just a few hours a day. This prevents injuries on your feet. Wounds, scrapes, corns, and calluses  Check your feet daily for blisters, cuts, bruises, sores, and redness. If you cannot see the bottom of your feet, use a mirror or ask someone for help. Do not cut corns or calluses or try to remove them with medicine. If you find a minor scrape, cut, or break in the skin on your feet, keep it and the skin around it clean and dry. You may clean these areas with mild soap and water. Do not  clean the area with peroxide, alcohol, or iodine. If you have a wound, scrape, corn, or callus on your foot, look at it several times a day to make sure it is healing and not infected. Check for: Redness, swelling, or pain. Fluid or blood. Warmth. Pus or a bad smell. General tips Do not cross your legs. This may decrease blood flow to your feet. Do not use heating pads or hot water bottles on your feet. They may burn your skin. If you have lost feeling in your feet or legs, you may not know this is happening until it is too late. Protect your feet from hot and cold by wearing shoes, such as at the beach or on hot pavement. Schedule a complete foot exam at least once a year (annually) or more often if you have foot problems. Report any cuts, sores, or bruises to your health care provider immediately. Where to find more information American Diabetes Association: www.diabetes.org Association of Diabetes Care & Education Specialists: www.diabeteseducator.org Contact a health care provider if: You have a medical condition that increases your risk of infection and you have any cuts, sores, or bruises on your feet. You have an injury that is not healing. You have redness on your legs or feet. You feel burning or tingling in your legs or feet. You have pain or cramps in your legs and feet. Your legs or feet are numb. Your feet always   feel cold. You have pain around any toenails. Get help right away if: You have a wound, scrape, corn, or callus on your foot and: You have pain, swelling, or redness that gets worse. You have fluid or blood coming from the wound, scrape, corn, or callus. Your wound, scrape, corn, or callus feels warm to the touch. You have pus or a bad smell coming from the wound, scrape, corn, or callus. You have a fever. You have a red line going up your leg. Summary Check your feet every day for blisters, cuts, bruises, sores, and redness. Apply a moisturizing lotion or  petroleum jelly to the skin on your feet and to dry, brittle toenails. Wear shoes that fit properly and have enough cushioning. If you have foot problems, report any cuts, sores, or bruises to your health care provider immediately. Schedule a complete foot exam at least once a year (annually) or more often if you have foot problems. This information is not intended to replace advice given to you by your health care provider. Make sure you discuss any questions you have with your health care provider. Document Revised: 08/04/2019 Document Reviewed: 08/04/2019 Elsevier Patient Education  [place].

## 2021-08-01 ENCOUNTER — Ambulatory Visit: Payer: Medicare Other | Admitting: Podiatry

## 2021-08-01 DIAGNOSIS — M6281 Muscle weakness (generalized): Secondary | ICD-10-CM | POA: Diagnosis not present

## 2021-08-01 DIAGNOSIS — R293 Abnormal posture: Secondary | ICD-10-CM | POA: Diagnosis not present

## 2021-08-01 DIAGNOSIS — R5381 Other malaise: Secondary | ICD-10-CM | POA: Diagnosis not present

## 2021-08-01 DIAGNOSIS — R2681 Unsteadiness on feet: Secondary | ICD-10-CM | POA: Diagnosis not present

## 2021-08-05 NOTE — Progress Notes (Unsigned)
ANNUAL DIABETIC FOOT EXAM  Subjective: Martin Richardson presents today for annual diabetic foot examination.  Patient confirms h/o diabetes.  Patient relates 15 year h/o diabetes.  Patient denies any h/o foot wounds.  Patient has been diagnosed with neuropathy and it is managed with gabapentin.  Patient's blood sugar was 228 mg/dl today. Last known  HgA1c was 7.2%   Risk factors: diabetes, diabetic neuropathy, h/o CVA, HTN, CAD, CKD, hyperlipidemia, h/o tobacco use in remission.  Martin Loader, FNP is patient's PCP. Last visit was July 24, 2021.  Patient is accompanied by his son in law, Martin Richardson.   Past Medical History:  Diagnosis Date   Arthritis    Blind left eye    Cancer of left kidney ([place])    Cataract    Chronic kidney disease    Constipation    Diabetes mellitus without complication ([facility])    Glaucoma    Hypertension    Neuropathy    Stroke [facility])    Patient Active Problem List   Diagnosis Date Noted   Complex sleep apnea syndrome 07/14/2021   Atherosclerotic heart disease of native coronary artery without angina pectoris 04/25/2021   Constipation 04/25/2021   Gastroesophageal reflux disease 04/25/2021   Hyperlipidemia, unspecified 04/25/2021   Insomnia 04/25/2021   Long term (current) use of insulin ([place]) 04/25/2021   Neuropathy 04/25/2021   Primary open-angle glaucoma, bilateral, stage unspecified 04/25/2021   Secondary hyperparathyroidism ([place]) 04/25/2021   Severe obstructive sleep apnea-hypopnea syndrome 04/17/2021   Malignant neoplasm of kidney ([place]) 03/19/2021   Hydronephrosis due to obstruction of ureter 02/21/2021   History of stroke in prior 3 months 02/21/2021   Excessive daytime sleepiness 02/21/2021   Absolute anemia 02/21/2021   Acute CVA (cerebrovascular accident) ([place]) 10/07/2020   Hypothyroidism (acquired) 10/07/2020   Unstable angina ([place])    Chest pain 12/15/2019   Stage 3b chronic kidney disease ([place])    DM neuropathy, painful ([place])  08/18/2019   Ankle fracture 08/12/2019   CKD (chronic kidney disease), stage IV ([place]) 08/12/2019   DM (diabetes mellitus), type 2 with renal complications ([place]) 71/21/9758   History of stroke 08/12/2019   Essential hypertension 08/12/2019   Closed right ankle fracture 08/12/2019   Benign hypertensive renal disease 07/22/2018   Benign prostatic hyperplasia 07/22/2018   Renal mass 07/22/2018   Chronic angle-closure glaucoma, bilateral 01/02/2012   Nuclear sclerotic cataract of left eye 01/02/2012   Amblyopia 07/29/2011   Cataract 07/29/2011   POAG (primary open-angle glaucoma) 07/29/2011   Past Surgical History:  Procedure Laterality Date   CHOLECYSTECTOMY     LEFT HEART CATH AND CORONARY ANGIOGRAPHY N/A 12/16/2019   Procedure: LEFT HEART CATH AND CORONARY ANGIOGRAPHY;  Surgeon: Martinique, Peter M, MD;  Location: [facility];  Service: Cardiovascular;  Laterality: N/A;   ORIF ANKLE FRACTURE Right 08/13/2019   Procedure: OPEN REDUCTION INTERNAL FIXATION (ORIF) ANKLE FRACTURE;  Surgeon: Renette Butters, MD;  Location: [facility];  Service: Orthopedics;  Laterality: Right;   THYROID SURGERY     TONSILLECTOMY     Current Outpatient Medications on File Prior to Visit  Medication Sig Dispense Refill   acetaminophen (TYLENOL) 325 MG tablet Take 650 mg by mouth every 4 (four) hours as needed for mild pain or headache.     amLODipine (NORVASC) 5 MG tablet Take 1 tablet (5 mg total) by mouth daily. 90 tablet 3   aspirin EC 81 MG EC tablet Take 1 tablet (81 mg total) by mouth daily.  Swallow whole. 30 tablet 11   atorvastatin (LIPITOR) 80 MG tablet Take 80 mg by mouth daily.     betamethasone valerate (VALISONE) 0.1 % cream Apply 1 application topically every Monday, Wednesday, and Friday. For psoriasis -  HOLD     BETIMOL 0.5 % ophthalmic solution Apply 1 drop to eye 2 (two) times daily.     cholecalciferol (VITAMIN D3) 25 MCG (1000 UNIT) tablet 1 tablet     diphenhydrAMINE (BENADRYL) 25 MG  tablet Take 25 mg by mouth at bedtime.     diphenhydrAMINE HCl, Sleep, (UNISOM SLEEPGELS) 50 MG CAPS Take one caps at desired bedtime po. 30 capsule 0   docusate sodium (COLACE) 100 MG capsule Take 100 mg by mouth 2 (two) times daily.     famotidine (PEPCID) 20 MG tablet Take 20 mg by mouth at bedtime.     gabapentin (NEURONTIN) 100 MG capsule Take 2 capsules (200 mg total) by mouth at bedtime. 60 capsule 0   glimepiride (AMARYL) 4 MG tablet Take 4 mg by mouth 2 (two) times daily.     insulin glargine (LANTUS) 100 UNIT/ML injection Lantus 45 units every morning, 40 units evening time, 10 mL 11   Insulin Pen Needle 29G X 12MM MISC Use as directed 200 each 0   levothyroxine (SYNTHROID) 125 MCG tablet Take 125 mcg by mouth daily before breakfast.     lidocaine (XYLOCAINE) 2 % solution Use as directed 15 mLs in the mouth or throat as needed for mouth pain.     ONETOUCH ULTRA test strip 1 each 2 (two) times daily.     pantoprazole (PROTONIX) 40 MG tablet Take 40 mg by mouth daily.     Probiotic Product (ALIGN) 4 MG CAPS Take 4 mg by mouth daily as needed (constipation).     tamsulosin (FLOMAX) 0.4 MG CAPS capsule Take 1 capsule (0.4 mg total) by mouth daily. 30 capsule    timolol (TIMOPTIC) 0.5 % ophthalmic solution SMARTSIG:In Eye(s)     triamcinolone cream (KENALOG) 0.1 % Apply 1 application topically as needed.     No current facility-administered medications on file prior to visit.    Allergies  Allergen Reactions   Other Other (See Comments)    Other reaction(s): rash   Tape Rash    Other reaction(s): rash   Social History   Occupational History   Occupation: retired  Tobacco Use   Smoking status: Former    Types: Pipe    Quit date: 1983    Years since quitting: 40.5   Smokeless tobacco: Former  Substance and Sexual Activity   Alcohol use: Yes    Comment: social   Drug use: Never   Sexual activity: Not on file   Family History  Problem Relation Age of Onset   Diabetes  Mellitus II Mother    Immunization History  Administered Date(s) Administered   Fluad Quad(high Dose 65+) 12/17/2019     Review of Systems: Negative except as noted in the HPI.   Objective: There were no vitals filed for this visit.  Martin Richardson is a pleasant 88 y.o. male in NAD. AAO X 3.  Vascular Examination: Palpable pedal pulses b/l LE. Pedal hair absent. No pain with calf compression b/l. Lower extremity skin temperature gradient within normal limits. Trace edema noted BLE. No cyanosis or clubbing noted b/l LE.  Dermatological Examination: Pedal skin thin, shiny and atrophic b/l LE. No open wounds b/l LE. No interdigital macerations noted b/l LE. Toenails 1-5  b/l elongated, discolored, dystrophic, thickened, crumbly with subungual debris and tenderness to dorsal palpation. No hyperkeratotic nor porokeratotic lesions present on today's visit.  Neurological Examination: Protective sensation decreased with 10 gram monofilament b/l. Vibratory sensation intact b/l.  Musculoskeletal Examination: Muscle strength 5/5 to all lower extremity muscle groups bilaterally. HAV with bunion deformity noted b/l LE.  Footwear Assessment: Does the patient wear appropriate shoes? Yes. Does the patient need inserts/orthotics? No.     Latest Ref Rng & Units 10/08/2020    4:31 AM  Hemoglobin A1C  Hemoglobin-A1c 4.8 - 5.6 % 9.3    Assessment: No diagnosis found.   ADA Risk Categorization:  High Risk  Patient has one or more of the following: Loss of protective sensation Absent pedal pulses Severe Foot deformity History of foot ulcer Plan: {[id number]::"-Patient/POA to call should there be question/concern in the interim."} Return in about 3 months (around 10/31/2021).  Marzetta Board, DPM

## 2021-08-15 DIAGNOSIS — H04123 Dry eye syndrome of bilateral lacrimal glands: Secondary | ICD-10-CM | POA: Diagnosis not present

## 2021-08-15 DIAGNOSIS — Z961 Presence of intraocular lens: Secondary | ICD-10-CM | POA: Diagnosis not present

## 2021-08-15 DIAGNOSIS — Z794 Long term (current) use of insulin: Secondary | ICD-10-CM | POA: Diagnosis not present

## 2021-08-15 DIAGNOSIS — E119 Type 2 diabetes mellitus without complications: Secondary | ICD-10-CM | POA: Diagnosis not present

## 2021-08-15 DIAGNOSIS — H401130 Primary open-angle glaucoma, bilateral, stage unspecified: Secondary | ICD-10-CM | POA: Diagnosis not present

## 2021-08-16 DIAGNOSIS — G4733 Obstructive sleep apnea (adult) (pediatric): Secondary | ICD-10-CM | POA: Diagnosis not present

## 2021-08-20 ENCOUNTER — Telehealth: Payer: Self-pay | Admitting: Adult Health

## 2021-08-20 NOTE — Telephone Encounter (Addendum)
Pt was scheduled for his initial CPAP visit on 10-23-21 Pt was informed to bring machine and power cord to the appointment. DME and between dates are in pt's SnapShot. Initial CPAP f/u schedule between 09-16-21 and 11-14-21

## 2021-08-22 DIAGNOSIS — G4733 Obstructive sleep apnea (adult) (pediatric): Secondary | ICD-10-CM | POA: Diagnosis not present

## 2021-09-23 ENCOUNTER — Telehealth: Payer: Self-pay

## 2021-09-23 NOTE — Telephone Encounter (Signed)
Received Plan of care from [facility] has been signed and faxed back.

## 2021-09-26 ENCOUNTER — Encounter (HOSPITAL_BASED_OUTPATIENT_CLINIC_OR_DEPARTMENT_OTHER): Payer: Self-pay | Admitting: Emergency Medicine

## 2021-09-26 ENCOUNTER — Other Ambulatory Visit: Payer: Self-pay

## 2021-09-26 ENCOUNTER — Emergency Department (HOSPITAL_BASED_OUTPATIENT_CLINIC_OR_DEPARTMENT_OTHER): Payer: Medicare Other

## 2021-09-26 ENCOUNTER — Emergency Department (HOSPITAL_BASED_OUTPATIENT_CLINIC_OR_DEPARTMENT_OTHER)
Admission: EM | Admit: 2021-09-26 | Discharge: 2021-09-26 | Disposition: A | Discharge status: Home / Self Care | Payer: Medicare Other | Attending: Emergency Medicine | Admitting: Emergency Medicine

## 2021-09-26 DIAGNOSIS — E119 Type 2 diabetes mellitus without complications: Secondary | ICD-10-CM | POA: Insufficient documentation

## 2021-09-26 DIAGNOSIS — R0981 Nasal congestion: Secondary | ICD-10-CM | POA: Diagnosis not present

## 2021-09-26 DIAGNOSIS — Z7984 Long term (current) use of oral hypoglycemic drugs: Secondary | ICD-10-CM | POA: Diagnosis not present

## 2021-09-26 DIAGNOSIS — R531 Weakness: Secondary | ICD-10-CM | POA: Diagnosis present

## 2021-09-26 DIAGNOSIS — Z794 Long term (current) use of insulin: Secondary | ICD-10-CM | POA: Diagnosis not present

## 2021-09-26 DIAGNOSIS — Z20822 Contact with and (suspected) exposure to covid-19: Secondary | ICD-10-CM | POA: Insufficient documentation

## 2021-09-26 DIAGNOSIS — Z7982 Long term (current) use of aspirin: Secondary | ICD-10-CM | POA: Diagnosis not present

## 2021-09-26 DIAGNOSIS — R0989 Other specified symptoms and signs involving the circulatory and respiratory systems: Secondary | ICD-10-CM | POA: Insufficient documentation

## 2021-09-26 LAB — COMPREHENSIVE METABOLIC PANEL
ALT: 13 U/L (ref 0–44)
AST: 12 U/L — ABNORMAL LOW (ref 15–41)
Albumin: 4 g/dL (ref 3.5–5.0)
Alkaline Phosphatase: 77 U/L (ref 38–126)
Anion gap: 9 (ref 5–15)
BUN: 45 mg/dL — ABNORMAL HIGH (ref 8–23)
CO2: 19 mmol/L — ABNORMAL LOW (ref 22–32)
Calcium: 10.3 mg/dL (ref 8.9–10.3)
Chloride: 104 mmol/L (ref 98–111)
Creatinine, Ser: 2.29 mg/dL — ABNORMAL HIGH (ref 0.61–1.24)
GFR, Estimated: 26 mL/min — ABNORMAL LOW (ref >60)
Glucose, Bld: 228 mg/dL — ABNORMAL HIGH (ref 70–99)
Potassium: 4.8 mmol/L (ref 3.5–5.1)
Sodium: 132 mmol/L — ABNORMAL LOW (ref 135–145)
Total Bilirubin: 0.7 mg/dL (ref 0.3–1.2)
Total Protein: 6.6 g/dL (ref 6.5–8.1)

## 2021-09-26 LAB — CBC WITH DIFFERENTIAL/PLATELET
Abs Immature Granulocytes: 0.05 10*3/uL (ref 0.00–0.07)
Basophils Absolute: 0.1 10*3/uL (ref 0.0–0.1)
Basophils Relative: 1 %
Eosinophils Absolute: 0.5 10*3/uL (ref 0.0–0.5)
Eosinophils Relative: 6 %
HCT: 36.2 % — ABNORMAL LOW (ref 39.0–52.0)
Hemoglobin: 12.4 g/dL — ABNORMAL LOW (ref 13.0–17.0)
Immature Granulocytes: 1 %
Lymphocytes Relative: 14 %
Lymphs Abs: 1.1 10*3/uL (ref 0.7–4.0)
MCH: 28.8 pg (ref 26.0–34.0)
MCHC: 34.3 g/dL (ref 30.0–36.0)
MCV: 84 fL (ref 80.0–100.0)
Monocytes Absolute: 1.1 10*3/uL — ABNORMAL HIGH (ref 0.1–1.0)
Monocytes Relative: 14 %
Neutro Abs: 5 10*3/uL (ref 1.7–7.7)
Neutrophils Relative %: 64 %
Platelets: 159 10*3/uL (ref 150–400)
RBC: 4.31 MIL/uL (ref 4.22–5.81)
RDW: 13.3 % (ref 11.5–15.5)
WBC: 7.8 10*3/uL (ref 4.0–10.5)
nRBC: 0 % (ref 0.0–0.2)

## 2021-09-26 LAB — RESP PANEL BY RT-PCR (FLU A&B, COVID) ARPGX2
Influenza A by PCR: NEGATIVE
Influenza B by PCR: NEGATIVE
SARS Coronavirus 2 by RT PCR: NEGATIVE

## 2021-09-26 LAB — CBG MONITORING, ED: Glucose-Capillary: 225 mg/dL — ABNORMAL HIGH (ref 70–99)

## 2021-09-26 MED ORDER — SODIUM CHLORIDE 0.9 % IV BOLUS
1000.0000 mL | Freq: Once | INTRAVENOUS | Status: AC
  Administered 2021-09-26: 1000 mL via INTRAVENOUS

## 2021-09-26 NOTE — ED Triage Notes (Signed)
Pt from home c/o of weakness and dizziness that started at about 0600 this morning. Pt has a hx of vertigo. Pt uses a walker or cane as needed.   Pt alert and oriented x 4

## 2021-09-26 NOTE — ED Notes (Signed)
Pt legally blind in left eye

## 2021-09-26 NOTE — ED Provider Notes (Signed)
[facility] EMERGENCY DEPT Provider Note   CSN: [id number] Arrival date & time: 09/26/21  1038     History  Chief Complaint  Patient presents with   Weakness    Martin Richardson is a 88 y.o. male.  Patient here with some generalized weakness.  Runny nose, congestion.  Viral type symptoms.  History of diabetes, chronic vertigo.  Overall feeling better now.  Had a fairly busy week last week and was traveling.  Denies any fevers or chills or cough or sputum production.  No stroke symptoms.  No chest pain or shortness of breath.  Being still makes him feel better.  Movement sometimes makes him not feel well.  The history is provided by the patient.       Home Medications Prior to Admission medications   Medication Sig Start Date End Date Taking? Authorizing Provider  acetaminophen (TYLENOL) 325 MG tablet Take 650 mg by mouth every 4 (four) hours as needed for mild pain or headache.    [provider]  amLODipine (NORVASC) 5 MG tablet Take 1 tablet (5 mg total) by mouth daily. 12/24/20   Belva Crome, MD  aspirin EC 81 MG EC tablet Take 1 tablet (81 mg total) by mouth daily. Swallow whole. 10/09/20   Elgergawy, Silver Huguenin, MD  atorvastatin (LIPITOR) 80 MG tablet Take 80 mg by mouth daily. 06/13/20   [provider]  betamethasone valerate (VALISONE) 0.1 % cream Apply 1 application topically every Monday, Wednesday, and Friday. For psoriasis -  HOLD    [provider]  BETIMOL 0.5 % ophthalmic solution Apply 1 drop to eye 2 (two) times daily. 06/20/21   [provider]  cholecalciferol (VITAMIN D3) 25 MCG (1000 UNIT) tablet 1 tablet    [provider]  diphenhydrAMINE (BENADRYL) 25 MG tablet Take 25 mg by mouth at bedtime.    [provider]  diphenhydrAMINE HCl, Sleep, (UNISOM SLEEPGELS) 50 MG CAPS Take one caps at desired bedtime po. 03/19/21   Dohmeier, Asencion Partridge, MD  docusate sodium (COLACE) 100 MG capsule Take 100 mg by  mouth 2 (two) times daily.    [provider]  famotidine (PEPCID) 20 MG tablet Take 20 mg by mouth at bedtime.    [provider]  gabapentin (NEURONTIN) 100 MG capsule Take 2 capsules (200 mg total) by mouth at bedtime. 08/22/19   Allie Bossier, MD  glimepiride (AMARYL) 4 MG tablet Take 4 mg by mouth 2 (two) times daily. 11/07/20   [provider]  insulin glargine (LANTUS) 100 UNIT/ML injection Lantus 45 units every morning, 40 units evening time, 10/08/20   Elgergawy, Silver Huguenin, MD  Insulin Pen Needle 29G X 12MM MISC Use as directed 08/22/19   Allie Bossier, MD  levothyroxine (SYNTHROID) 125 MCG tablet Take 125 mcg by mouth daily before breakfast.    [provider]  lidocaine (XYLOCAINE) 2 % solution Use as directed 15 mLs in the mouth or throat as needed for mouth pain.    [provider]  [facility] ULTRA test strip 1 each 2 (two) times daily. 01/09/20   [provider]  pantoprazole (PROTONIX) 40 MG tablet Take 40 mg by mouth daily. 10/17/20   [provider]  Probiotic Product (ALIGN) 4 MG CAPS Take 4 mg by mouth daily as needed (constipation).    [provider]  tamsulosin (FLOMAX) 0.4 MG CAPS capsule Take 1 capsule (0.4 mg total) by mouth daily. 10/08/20   Elgergawy, Emeline Gins  S, MD  timolol (TIMOPTIC) 0.5 % ophthalmic solution SMARTSIG:In Eye(s) 02/12/20   [provider]  triamcinolone cream (KENALOG) 0.1 % Apply 1 application topically as needed.    [provider]      Allergies    Other and Tape    Review of Systems   Review of Systems  Physical Exam Updated Vital Signs BP 110/70   Pulse 66   Temp 98.4 F (36.9 C) (Oral)   Resp 17   Ht '5\' 11"'$  (1.803 m)   Wt 104.3 kg   SpO2 95%   BMI 32.08 kg/m  Physical Exam Vitals and nursing note reviewed.  Constitutional:      General: He is not in acute distress.    Appearance: He is well-developed.  HENT:     Head: Normocephalic and  atraumatic.     Nose: Congestion present.     Mouth/Throat:     Mouth: Mucous membranes are moist.  Eyes:     Extraocular Movements: Extraocular movements intact.     Conjunctiva/sclera: Conjunctivae normal.     Pupils: Pupils are equal, round, and reactive to light.  Cardiovascular:     Rate and Rhythm: Normal rate and regular rhythm.     Pulses: Normal pulses.     Heart sounds: Normal heart sounds. No murmur heard. Pulmonary:     Effort: Pulmonary effort is normal. No respiratory distress.     Breath sounds: Normal breath sounds.  Abdominal:     Palpations: Abdomen is soft.     Tenderness: There is no abdominal tenderness.  Musculoskeletal:        General: No swelling.     Cervical back: Neck supple.  Skin:    General: Skin is warm and dry.     Capillary Refill: Capillary refill takes less than 2 seconds.  Neurological:     General: No focal deficit present.     Mental Status: He is alert and oriented to person, place, and time.     Cranial Nerves: No cranial nerve deficit.     Sensory: No sensory deficit.     Motor: No weakness.     Coordination: Coordination normal.     Comments: 5+ out of 5 strength throughout, normal sensation, no drift, normal finger-nose-finger, normal speech.  Psychiatric:        Mood and Affect: Mood normal.     ED Results / Procedures / Treatments   Labs (all labs ordered are listed, but only abnormal results are displayed) Labs Reviewed  COMPREHENSIVE METABOLIC PANEL - Abnormal; Notable for the following components:      Result Value   Sodium 132 (*)    CO2 19 (*)    Glucose, Bld 228 (*)    BUN 45 (*)    Creatinine, Ser 2.29 (*)    AST 12 (*)    GFR, Estimated 26 (*)    All other components within normal limits  CBC WITH DIFFERENTIAL/PLATELET - Abnormal; Notable for the following components:   Hemoglobin 12.4 (*)    HCT 36.2 (*)    Monocytes Absolute 1.1 (*)    All other components within normal limits  CBG MONITORING, ED -  Abnormal; Notable for the following components:   Glucose-Capillary 225 (*)    All other components within normal limits  RESP PANEL BY RT-PCR (FLU A&B, COVID) [id number]  CBC WITH DIFFERENTIAL/PLATELET    EKG EKG Interpretation  Date/Time:  Thursday September 26 2021 10:53:41 EDT Ventricular Rate:  71 PR  Interval:  397 QRS Duration: 100 QT Interval:  374 QTC Calculation: 407 R Axis:   -27 Text Interpretation: Sinus rhythm Prolonged PR interval Borderline left axis deviation Abnormal R-wave progression, late transition Confirmed by Lennice Sites ([id number]) on 09/26/2021 11:02:13 AM  Radiology DG Chest Portable 1 View  Result Date: 09/26/2021 CLINICAL DATA:  cough; weakness and dizziness EXAM: PORTABLE CHEST 1 VIEW COMPARISON:  December 14, 2019 FINDINGS: Again seen are the postsurgical changes with multiple surgical clips at the left lower neck. The heart size and mediastinal contours are stable. Low lung volume. No consolidation, pleural effusion or significant vascular congestion. Pleuropulmonary scarring-minor atelectasis at the left lung base, stable. Moderate thoracic spondylosis. IMPRESSION: Minor atelectasis-pleuropulmonary scarring at the left lung base, stable. Electronically Signed   By: Frazier Richards M.D.   On: 09/26/2021 11:24    Procedures Procedures    Medications Ordered in ED Medications  sodium chloride 0.9 % bolus 1,000 mL (1,000 mLs Intravenous New Bag/Given 09/26/21 1220)    ED Course/ Medical Decision Making/ A&P                           Medical Decision Making Amount and/or Complexity of Data Reviewed Labs: ordered. Radiology: ordered.   Martin Richardson is here with generalized weakness.  Normal vitals.  No fever.  Comorbidities include diabetes, stroke.  Differential likely dehydration versus viral process.  Have no concern for ACS or stroke.  We will get CBC, CMP, chest x-ray, EKG, give fluid bolus.  Orthostatics are normal here.  Will check COVID and flu  test.  EKG per my review and interpretation shows sinus rhythm.  No ischemic changes.  Chest x-ray per my review and interpretation shows no evidence of pneumonia or pneumothorax.  Blood sugar is 225.  Per further review and interpretation of labs is no significant anemia, electrolyte abnormality, kidney injury.  Patient feeling better after IV fluids.  Discharged in good condition.  Overall suspect mild dehydration.  This chart was dictated using voice recognition software.  Despite best efforts to proofread,  errors can occur which can change the documentation meaning.         Final Clinical Impression(s) / ED Diagnoses Final diagnoses:  Weakness    Rx / DC Orders ED Discharge Orders     None         Lennice Sites, DO 09/26/21 1309

## 2021-10-23 ENCOUNTER — Ambulatory Visit: Payer: Medicare Other | Admitting: Neurology

## 2021-10-24 ENCOUNTER — Telehealth: Payer: Self-pay | Admitting: *Deleted

## 2021-10-24 NOTE — Telephone Encounter (Signed)
Called and spoke w/ pt and daughter. Currently, with 53-70 days of data for CPAP, he is at 49% of usage for 4 hours or greater of use each night. We want this to be at least 70% or more for initial cpap f/u.  Pt states he feels machine not working correctly. Cannot get correct seal on mask and has been back to [facility] 6 times for refits. He has about 6 different masks he has tried already.  He would like to keep appt 10/28/21 at 1:30pm w/ Dr. Brett Fairy to discuss options at this point. Daughter will bring him. They will check in at 1pm.

## 2021-10-28 ENCOUNTER — Ambulatory Visit: Payer: Medicare Other | Admitting: Neurology

## 2021-10-28 ENCOUNTER — Encounter: Payer: Self-pay | Admitting: Neurology

## 2021-10-28 VITALS — BP 161/88 | HR 62 | Ht 71.0 in | Wt 230.0 lb

## 2021-10-28 DIAGNOSIS — R2689 Other abnormalities of gait and mobility: Secondary | ICD-10-CM | POA: Diagnosis not present

## 2021-10-28 DIAGNOSIS — G4730 Sleep apnea, unspecified: Secondary | ICD-10-CM

## 2021-10-28 DIAGNOSIS — Z8673 Personal history of transient ischemic attack (TIA), and cerebral infarction without residual deficits: Secondary | ICD-10-CM | POA: Diagnosis not present

## 2021-10-28 DIAGNOSIS — Z9989 Dependence on other enabling machines and devices: Secondary | ICD-10-CM | POA: Diagnosis not present

## 2021-10-28 NOTE — Progress Notes (Signed)
SLEEP MEDICINE CLINIC    Provider:  Larey Seat, MD  Primary Care Physician:  Kristen Loader, [place] [address]     Referring Provider: Kristen Loader, [place],  [place] [address]          Chief Complaint according to patient   Patient presents with:     stroke Patient (Initial Visit)      BipAP has worked but he cant tolerate the masks.       HISTORY OF PRESENT ILLNESS:  Martin Richardson is a 88 y.o. Caucasian male patient seen here as a referral on 10/28/2021 from Stroke Team NP for a RV after several sleep tests- finally BiPAP titration:     HISTORY:  was here recently for attended titration study with new mask fitting. Chinstrap added.   Martin Richardson is a 88 y.o. Caucasian male patient seen here as a referral on 02/21/2021 from Stroke Team NP  for a sleep apnea evaluation. .  Evaluated by Dr. Brett Fairy 1/26 with prior history of sleep apnea/ He was diagnosed with apnea in [place] and was not having follow up, developed problems with CPAP, FFM left bleeding blisters on outside of the mouth, chin strap, he was frustrated.   It has been more recently that he has had problems with going to sleep and staying asleep. He is staying up till midnight watching acc basketball and some stressful. He stopped about 2019. Initially underwent split-night study on 2/5 but only reported 8.5 minutes of sleep therefore proceeded with HST.later followed b with CPAP and BiPAP.   10-28-2021: RV  The patient is a mean 88 year old cognitively alert gentleman presenting here today with his daughter and has been on BiPAP now since July or June 2023.  He has tried 6 different masks but is unable to meet the compliance of 4 hours of daily use.  He has worked with Social research officer, government.  And what I see is a 76% compliant by days but some days for short of even 2 hours of use.  The average usage of days used is 4 hours and 54 minutes so I need to convince him  to use it every day and to at least aim for 4 hours his current BiPAP setting is 21 over 17 cm water pressure and we had the so far the best results of apnea control central apneas residual at 2.8/h and they are about 2.3/h unknown apneas these are related to air leaks.  The 95th percentile air leak is 59 L/min so this keeps him awake.  He was frustrated with a full facemask in the past and we have tried a chinstrap with nasal pillow he had failed CPAP but or I should say has been failed by CPAP, but has had much better control on BiPAP.  He continues to endorse the Epworth Sleepiness Scale at a very high degree at the last visit to 20/24 points today 17/24 points so at least some decrease in daytime sleepiness seems to be present.  Fatigue severity now is 37 out of 63 points.  This is also decreased in comparison to 40 out of 63 points.    I am ordering gecko skin patch for his nasal bridge, encouraged use in pm at 8 PM and then continue through the night.   He is willing to try and has much less apnea than untreated!         HST by  watch pat completed 3/20 which showed:  Calculated pAHI (per hour): 69.9/h                           REM pAHI: 53/h                                             NREM pAHI:   73.4/h                         Auto Pap initiated 3/28.residual AHI was 19.8/h. Initially had difficulty tolerating full face mask, now using hybrid mask with improvement of tolerance.  Reports improvement of sleep quality and some increase energy during the day since starting.  Epworth Sleepiness Scale 20/24 (before start 20/24).  Fatigue severity scale 37/63 (before start 35/63).  07-01-2021: CPAP was initiated under a Simplus FFM in medium size, with a chin strap later added.  Beginning at 27 cmH20 with heated humidity per AASM standards, this PAP pressure was advanced to 15 cmH20 because of hypopneas, apneas, and desaturations. EPR of 3 cm was added and patient was transferred to BiPAP- beginning  at 17/13 cm water, titrated up to 22/18 cm water- with the best result seen at   BiPAP pressure of 21/ 17 cmH20, achieving a reduction of the AHI to 3.0 /h over a sleep time of 70 minutes with improvement of sleep apnea.  Lights Out was at 20:47 and Lights On at 04:52. Total recording time (TRT) was 480 minutes, with a total sleep time (TST) of 306.5 minutes. The patient's sleep latency was 48.5 minutes. REM latency was 123.5 minutes.  The sleep efficiency was 63.9 %.    SLEEP ARCHITECTURE: WASO (Wake after sleep onset) was high at 142.5 minutes.   There were 28.5 minutes in Stage N1, 193.5 minutes Stage N2, 60.5 minutes Stage N3 and 24 minutes in Stage REM.  The percentage of Stage N1 was 9.3%, Stage N2 was 63.1%, Stage N3 was 19.7% and Stage R (REM sleep) was 7.8%.  RESPIRATORY ANALYSIS:  There was a total of 63 respiratory events:  4 obstructive apneas, 10 central apneas and 0 mixed apneas with a total of 14 apneas and an apnea index (AI) of 2.7 /hour. There were 49 hypopneas with a hypopnea index of 9.6/hour.   The total APNEA/HYPOPNEA INDEX  (AHI) was 12.3 /hour . 1 event occurred in REM sleep and 62 events in NREM.  The REM AHI was 2.5 /hour versus a non-REM AHI of 13.2 /hour.   The patient spent 306.5 minutes of total sleep time in the supine position and 0 minutes in non-supine. The supine AHI was 12.3, versus a non-supine AHI of 0.0. OXYGEN SATURATION & C02:  The baseline 02 saturation was 94%, with the lowest being 86%. Time spent below 89% saturation equaled 1 minute. sleep apnea evaluation. .        Chief concern according to patient :  Pt with daughter, rm 24. Pt states that until recently he would avg about 7 hrs of sleep. He was diagnosed with apnea in [place] and was not having follow up, developed problems with CPAP, FFM left bleeding blisters on outside of the mouth, chin strap, he was frustrated.    It has been more recently that he has had problems with going to sleep  and staying asleep. He is staying up til midnight watching acc basketball and some stressful. Pt has been diagnosed with OSA before. He wore a CPAP at one time and it caused problems with the cpap rubbing blisters. He stopped about 2019.    I have the pleasure of seeing Martin Richardson today, a right-handed male.  Who has a past medical history of osteo-arthritis, Blind left eye, Cancer of left kidney ([place]), Cataract, Chronic kidney disease stage , Hydronephrosis, CVA times 2, Constipation, tachy brady cardia- at night.Diabetes mellitus with Neuropathy ([place]), Glaucoma, Hypertension, dysphagia, dysphonia, Vertigo, deviated nasal septum..   I have no data form the [place] sleep study. Sleep relevant medical history: Nocturia/ deviated septum , choking at night.     Family medical /sleep history: no other family member on CPAP with OSA    Social history:  Patient is retired from Press photographer, Orthoptist- and lives in a household  alone.  Widowed in 2018, was caretaker of his wife who had MS, lived in [place] until 2021. Tobacco use;none.   ETOH use rare , Caffeine intake in form of Coffee( 1 cup most mornings) Soda( /) Tea ( 1 a day) or energy drinks.   Hobbies :[employer]-      Sleep habits are as follows: The patient's dinner time is between 5-7 PM. The patient goes to bed at 11-12 PM and continues to sleep for 2-3 hours, wakes for one bathroom breaks.6-7 hours of sleep total.  The preferred sleep position is sideways and supine , with the support of 2 pillows.  Head elevated Dreams are reportedly  frequent/vivid.   7-7.30 AM is the usual rise time. The patient wakes up spontaneously.  He reports not feeling refreshed or restored in AM, with symptoms such as dry mouth, morning headaches, and residual fatigue.  Naps frequently in a chair- 10 minutes to 120 minutes !   Review of Systems: Out of a complete 14 system review, the patient complains of only the following symptoms, and all other reviewed  systems are negative.:  Fatigue, sleepiness , snoring, fragmented sleep, tachy brady-   How likely are you to doze in the following situations: 0 = not likely, 1 = slight chance, 2 = moderate chance, 3 = high chance   Sitting and Reading? Watching Television? Sitting inactive in a public place (theater or meeting)? As a passenger in a car for an hour without a break? Lying down in the afternoon when circumstances permit? Sitting and talking to someone? Sitting quietly after lunch without alcohol? In a car, while stopped for a few minutes in traffic?   Total = 24/ 24 points  in first visit, now on BIPAP 17/ 24- 10-28-2021  FSS endorsed at 35/ 63 points.  Now 37 / 63 points.    Social History   Socioeconomic History   Marital status: Widowed    Spouse name: Not on file   Number of children: Not on file   Years of education: Not on file   Highest education level: Not on file  Occupational History   Occupation: retired  Tobacco Use   Smoking status: Former    Types: Pipe    Quit date: 1983    Years since quitting: 40.7   Smokeless tobacco: Former  Substance and Sexual Activity   Alcohol use: Yes    Comment: social   Drug use: Never   Sexual activity: Not on file  Other Topics Concern   Not on file  Social  History Narrative   Not on file   Social Determinants of Health   Financial Resource Strain: Not on file  Food Insecurity: Not on file  Transportation Needs: Not on file  Physical Activity: Not on file  Stress: Not on file  Social Connections: Not on file    Family History  Problem Relation Age of Onset   Diabetes Mellitus II Mother     Past Medical History:  Diagnosis Date   Arthritis    Blind left eye    Cancer of left kidney ([place])    Cataract    Chronic kidney disease    Constipation    Diabetes mellitus without complication ([place])    Glaucoma    Hypertension    Neuropathy    Stroke [facility])     Past Surgical History:  Procedure Laterality Date    CHOLECYSTECTOMY     LEFT HEART CATH AND CORONARY ANGIOGRAPHY N/A 12/16/2019   Procedure: LEFT HEART CATH AND CORONARY ANGIOGRAPHY;  Surgeon: Martinique, Peter M, MD;  Location: [facility];  Service: Cardiovascular;  Laterality: N/A;   ORIF ANKLE FRACTURE Right 08/13/2019   Procedure: OPEN REDUCTION INTERNAL FIXATION (ORIF) ANKLE FRACTURE;  Surgeon: Renette Butters, MD;  Location: [facility];  Service: Orthopedics;  Laterality: Right;   THYROID SURGERY     TONSILLECTOMY       Current Outpatient Medications on File Prior to Visit  Medication Sig Dispense Refill   acetaminophen (TYLENOL) 325 MG tablet Take 650 mg by mouth every 4 (four) hours as needed for mild pain or headache.     amLODipine (NORVASC) 5 MG tablet Take 1 tablet (5 mg total) by mouth daily. 90 tablet 3   aspirin EC 81 MG EC tablet Take 1 tablet (81 mg total) by mouth daily. Swallow whole. 30 tablet 11   atorvastatin (LIPITOR) 80 MG tablet Take 80 mg by mouth daily.     betamethasone valerate (VALISONE) 0.1 % cream Apply 1 application topically every Monday, Wednesday, and Friday. For psoriasis -  HOLD     BETIMOL 0.5 % ophthalmic solution Apply 1 drop to eye 2 (two) times daily.     cholecalciferol (VITAMIN D3) 25 MCG (1000 UNIT) tablet 1 tablet     diphenhydrAMINE (BENADRYL) 25 MG tablet Take 25 mg by mouth at bedtime.     diphenhydrAMINE HCl, Sleep, (UNISOM SLEEPGELS) 50 MG CAPS Take one caps at desired bedtime po. 30 capsule 0   docusate sodium (COLACE) 100 MG capsule Take 100 mg by mouth 2 (two) times daily.     famotidine (PEPCID) 20 MG tablet Take 20 mg by mouth at bedtime.     gabapentin (NEURONTIN) 100 MG capsule Take 2 capsules (200 mg total) by mouth at bedtime. 60 capsule 0   glimepiride (AMARYL) 4 MG tablet Take 4 mg by mouth 2 (two) times daily.     insulin glargine (LANTUS) 100 UNIT/ML injection Lantus 45 units every morning, 40 units evening time, 10 mL 11   Insulin Pen Needle 29G X 12MM MISC Use as directed  200 each 0   levothyroxine (SYNTHROID) 125 MCG tablet Take 125 mcg by mouth daily before breakfast.     lidocaine (XYLOCAINE) 2 % solution Use as directed 15 mLs in the mouth or throat as needed for mouth pain.     ONETOUCH ULTRA test strip 1 each 2 (two) times daily.     pantoprazole (PROTONIX) 40 MG tablet Take 40 mg by mouth daily.  Probiotic Product (ALIGN) 4 MG CAPS Take 4 mg by mouth daily as needed (constipation).     tamsulosin (FLOMAX) 0.4 MG CAPS capsule Take 1 capsule (0.4 mg total) by mouth daily. 30 capsule    timolol (TIMOPTIC) 0.5 % ophthalmic solution SMARTSIG:In Eye(s)     triamcinolone cream (KENALOG) 0.1 % Apply 1 application topically as needed.     No current facility-administered medications on file prior to visit.    Allergies  Allergen Reactions   Other Other (See Comments)    Other reaction(s): rash   Tape Rash    Other reaction(s): rash    Physical exam:  Today's Vitals   10/28/21 1327  BP: (!) 161/88  Pulse: 62  Weight: 230 lb (104.3 kg)  Height: '5\' 11"'$  (1.803 m)   Body mass index is 32.08 kg/m.   Wt Readings from Last 3 Encounters:  10/28/21 230 lb (104.3 kg)  09/26/21 230 lb (104.3 kg)  06/05/21 230 lb (104.3 kg)     Ht Readings from Last 3 Encounters:  10/28/21 '5\' 11"'$  (1.803 m)  09/26/21 '5\' 11"'$  (1.803 m)  06/05/21 '5\' 11"'$  (1.803 m)      General: The patient is awake, alert and appears not in acute distress. The patient is well groomed. Head: Normocephalic, atraumatic. Neck is supple. Mallampati 3,  neck circumference:16.5 inches . Nasal airflow  patent.  Retrognathia is not seen.  Dental status: BIOLOGICAL- loud snoring.  Cardiovascular:  Regular rate and cardiac rhythm by pulse,  without distended neck veins. Respiratory: Lungs are clear to auscultation.  Skin:  Without evidence of ankle edema, or rash. Trunk: The patient's posture is erect.   Neurologic exam : The patient is awake and alert, oriented to place and time.   Memory  subjective described as intact.  Attention span & concentration ability appears normal.  Speech is fluent,  without  dysarthria, dysphonia or aphasia.  Mood and affect are appropriate.   Cranial nerves: no loss of smell or taste reported  Pupils are equal and briskly reactive to light. Funduscopic exam deferred. Left eye  blindness, lazy eye since birth . Cataract in the left present, removed in the right.  Extraocular movements in vertical and horizontal planes were intact and without nystagmus. No Diplopia. Visual fields monocular  Hearing was intact to soft voice and finger rubbing.    Facial sensation intact to fine touch.  Facial motor strength is symmetric and tongue and uvula move midline.  Neck ROM : rotation, tilt and flexion extension were normal for age and shoulder shrug was symmetrical.    Motor exam:  Symmetric bulk, tone and ROM.   Rapid alternating movements in the fingers/hands were of normal speed.  Deferred.   Gait and station: Patient could rise unassisted from a seated position, walked without assistive device.  Stance is of normal width/ base and the patient turned with 4 steps.       After spending a total time of 25 minutes face to face and additional time for physical and neurologic examination, review of laboratory studies,  personal review of imaging studies, reports and results of other testing and review of referral information / records as far as provided in visit, I have established the following assessments:  Assessment:1)  central apnea, severe apnea improved on BiPAP, but compliance is below CMS guidelines.    My Plan is to proceed with:  1) The 95th percentile air leak is 59 L/min so this keeps him awake.  He was frustrated with a full facemask in the past and we have tried a chinstrap with nasal pillow he had failed CPAP but or I should say has been failed by CPAP, but has had much better control on BiPAP.  He continues to endorse the Epworth  Sleepiness Scale at a very high degree at the last visit to 20/24 points today 17/24 points so at least some decrease in daytime sleepiness seems to be present.  Fatigue severity now is 37 out of 63 points.  This is also decreased in comparison to 40 out of 63 points His daughter sees him more later, and more focussed.   I am ordering gecko skin patch for his nasal bridge, encouraged use in pm at 8 PM and then continue through the night.  He is willing to try and has much less apnea than untreated!    I would like to thank  Dr Antony Contras , MD , Frann Rider , NP and NP Jillyn Ledger [address] [place], [place],  [place] [address] for allowing me to meet with and to take care of this pleasant patient.   In short, Martin Richardson is presenting with EDS. Daily naps. On BiPAP improving and struggling with compliance, but main problem is air leak and nasal bridge pressure marks. I am ordering gecko skin patch for his nasal bridge, encouraged use in pm at 8 PM and then continue through the night.  He is willing to try and has much less apnea than untreated!    Electronically signed by: Larey Seat, MD 10/28/2021 1:43 PM  [facility] Board certified by The [employer] of Sleep Medicine and Diplomate of the [employer] of Sleep Medicine. Board certified In Neurology through the [place], Fellow of the [employer] of Neurology. Medical Director of [employer].

## 2021-10-28 NOTE — Patient Instructions (Signed)
Quality Sleep Information, Adult Quality sleep is important for your mental and physical health. It also improves your quality of life. Quality sleep means you: Are asleep for most of the time you are in bed. Fall asleep within 30 minutes. Wake up no more than once a night. Are awake for no longer than 20 minutes if you do wake up during the night. Most adults need 7-8 hours of quality sleep each night. How can poor sleep affect me? If you do not get enough quality sleep, you may have: Mood swings. Daytime sleepiness. Decreased alertness, reaction time, and concentration. Sleep disorders, such as insomnia and sleep apnea. Difficulty with: Solving problems. Coping with stress. Paying attention. These issues may affect your performance and productivity at work, school, and home. Lack of sleep may also put you at higher risk for accidents, suicide, and risky behaviors. If you do not get quality sleep, you may also be at higher risk for several health problems, including: Infections. Type 2 diabetes. Heart disease. High blood pressure. Obesity. Worsening of long-term conditions, like arthritis, kidney disease, depression, Parkinson's disease, and epilepsy. What actions can I take to get more quality sleep? Sleep schedule and routine Stick to a sleep schedule. Go to sleep and wake up at about the same time each day. Do not try to sleep less on weekdays and make up for lost sleep on weekends. This does not work. Limit naps during the day to 30 minutes or less. Do not take naps in the late afternoon. Make time to relax before bed. Reading, listening to music, or taking a hot bath promotes quality sleep. Make your bedroom a place that promotes quality sleep. Keep your bedroom dark, quiet, and at a comfortable room temperature. Make sure your bed is comfortable. Avoid using electronic devices that give off bright blue light for 30 minutes before bedtime. Your brain perceives bright blue light  as sunlight. This includes television, phones, and computers. If you are lying awake in bed for longer than 20 minutes, get up and do a relaxing activity until you feel sleepy. Lifestyle     Try to get at least 30 minutes of exercise on most days. Do not exercise 2-3 hours before going to bed. Do not use any products that contain nicotine or tobacco. These products include cigarettes, chewing tobacco, and vaping devices, such as e-cigarettes. If you need help quitting, ask your health care provider. Do not drink caffeinated beverages for at least 8 hours before going to bed. Coffee, tea, and some sodas contain caffeine. Do not drink alcohol or eat large meals close to bedtime. Try to get at least 30 minutes of sunlight every day. Morning sunlight is best. Medical concerns Work with your health care provider to treat medical conditions that may affect sleeping, such as: Nasal obstruction. Snoring. Sleep apnea and other sleep disorders. Talk to your health care provider if you think any of your prescription medicines may cause you to have difficulty falling or staying asleep. If you have sleep problems, talk with a sleep consultant. If you think you have a sleep disorder, talk with your health care provider about getting evaluated by a specialist. Where to find more information Sleep Foundation: sleepfoundation.org American Academy of Sleep Medicine: aasm.org Centers for Disease Control and Prevention (CDC): StoreMirror.com.cy Contact a health care provider if: You have trouble getting to sleep or staying asleep. You often wake up very early in the morning and cannot get back to sleep. You have daytime sleepiness. You  have daytime sleep attacks of suddenly falling asleep and sudden muscle weakness (narcolepsy). You have a tingling sensation in your legs with a strong urge to move your legs (restless legs syndrome). You stop breathing briefly during sleep (sleep apnea). You think you have a sleep  disorder or are taking a medicine that is affecting your quality of sleep. Summary Most adults need 7-8 hours of quality sleep each night. Getting enough quality sleep is important for your mental and physical health. Make your bedroom a place that promotes quality sleep, and avoid things that may cause you to have poor sleep, such as alcohol, caffeine, smoking, or large meals. Talk to your health care provider if you have trouble falling asleep or staying asleep. This information is not intended to replace advice given to you by your health care provider. Make sure you discuss any questions you have with your health care provider. Document Revised: 05/08/2021 Document Reviewed: 05/08/2021 Elsevier Patient Education  2023 Elsevier Inc.  

## 2021-10-29 DIAGNOSIS — I129 Hypertensive chronic kidney disease with stage 1 through stage 4 chronic kidney disease, or unspecified chronic kidney disease: Secondary | ICD-10-CM | POA: Diagnosis not present

## 2021-11-01 DIAGNOSIS — D692 Other nonthrombocytopenic purpura: Secondary | ICD-10-CM | POA: Diagnosis not present

## 2021-11-01 DIAGNOSIS — D1801 Hemangioma of skin and subcutaneous tissue: Secondary | ICD-10-CM | POA: Diagnosis not present

## 2021-11-01 DIAGNOSIS — L570 Actinic keratosis: Secondary | ICD-10-CM | POA: Diagnosis not present

## 2021-11-01 DIAGNOSIS — R21 Rash and other nonspecific skin eruption: Secondary | ICD-10-CM | POA: Diagnosis not present

## 2021-11-01 DIAGNOSIS — L821 Other seborrheic keratosis: Secondary | ICD-10-CM | POA: Diagnosis not present

## 2021-11-05 ENCOUNTER — Ambulatory Visit: Payer: Medicare Other | Admitting: Podiatry

## 2021-11-05 ENCOUNTER — Encounter: Payer: Self-pay | Admitting: Podiatry

## 2021-11-05 DIAGNOSIS — E1142 Type 2 diabetes mellitus with diabetic polyneuropathy: Secondary | ICD-10-CM | POA: Diagnosis not present

## 2021-11-05 DIAGNOSIS — M79676 Pain in unspecified toe(s): Secondary | ICD-10-CM

## 2021-11-05 DIAGNOSIS — B351 Tinea unguium: Secondary | ICD-10-CM | POA: Diagnosis not present

## 2021-11-05 NOTE — Progress Notes (Signed)
  Subjective:  Patient ID: Martin Richardson, male    DOB: [birth year],  MRN: [id number]  Martin Richardson presents to clinic today for:  Chief Complaint  Patient presents with   Nail Problem    Diabetic foot care BS-did not check today. 187 yesterday A1C-9.2 or 9.3 PCP-Andrew Brake PCP VST- 3 months ago   New problem(s): None.   He is accompanied by his daughter on today's visit.  Allergies  Allergen Reactions   Other Other (See Comments)    Other reaction(s): rash   Tape Rash    Other reaction(s): rash   Review of Systems: Negative except as noted in the HPI.  Objective: No changes noted in today's physical examination.  Martin Richardson is a pleasant 88 y.o. male WD, WN in NAD. AAO x 3.  Vascular Examination: Palpable pedal pulses b/l LE. Pedal hair absent. No pain with calf compression b/l. Lower extremity skin temperature gradient within normal limits. Trace edema noted BLE. No cyanosis or clubbing noted b/l LE.  Dermatological Examination: Pedal skin thin, shiny and atrophic b/l LE. No open wounds b/l LE. No interdigital macerations noted b/l LE. Toenails 1-5 b/l elongated, discolored, dystrophic, thickened, crumbly with subungual debris and tenderness to dorsal palpation. No hyperkeratotic nor porokeratotic lesions present on today's visit.  Neurological Examination: Protective sensation decreased with 10 gram monofilament b/l. Vibratory sensation intact b/l.  Musculoskeletal Examination: Muscle strength 5/5 to all lower extremity muscle groups bilaterally. HAV with bunion deformity noted b/l LE. Patient ambulates with cane assistance.  Assessment/Plan: 1. Pain due to onychomycosis of toenail   2. Diabetic peripheral neuropathy associated with type 2 diabetes mellitus ([place])     No orders of the defined types were placed in this encounter.   -Patient's family member present. All questions/concerns addressed on today's visit. -Examined patient. -Continue  diabetic foot care principles: inspect feet daily, monitor glucose as recommended by PCP and/or Endocrinologist, and follow prescribed diet per PCP, Endocrinologist and/or dietician. -Toenails 1-5 b/l were debrided in length and girth with sterile nail nippers and dremel without iatrogenic bleeding.  -Patient/POA to call should there be question/concern in the interim.   Return in about 3 months (around 02/05/2022).  Marzetta Board, DPM

## 2021-11-16 DIAGNOSIS — G4733 Obstructive sleep apnea (adult) (pediatric): Secondary | ICD-10-CM | POA: Diagnosis not present

## 2021-12-03 DIAGNOSIS — G4733 Obstructive sleep apnea (adult) (pediatric): Secondary | ICD-10-CM | POA: Diagnosis not present

## 2021-12-06 DIAGNOSIS — Z23 Encounter for immunization: Secondary | ICD-10-CM | POA: Diagnosis not present

## 2021-12-10 NOTE — Progress Notes (Unsigned)
[facility] [address] [place]. [place] [address] [phone]       OFFICE FOLLOW UP NOTE  [name] Date of Birth:  08-May-1925 Medical Record Number:  [id number]    Primary neurologist: Dr. Leonie Richardson Sleep neurologist: Dr. Brett Richardson Reason for Referral: stroke follow-up    SUBJECTIVE:   CHIEF COMPLAINT:  Chief Complaint  Patient presents with   Follow-up    RM 3 with daughter Martin Richardson  Pt is well and hasn't notice much change since last visit. Daughter states he is having more imbalance, cognition decline and more weakness.     HPI:   Update 12/11/2021 JM: Patient returns for 9-monthstroke follow-up accompanied by his daughter.  Overall stable without new stroke/TIA symptoms.  Daughter concerned regarding continued gait difficulty which she believes continues to decline, as well as decline of memory, generalized weakness and less stamina. Did work with PT since the last visit, completed PT after working with them for 2 months. Did see some improvement but plateaued per patient. Not routinely doing exercises at home as advised.  He tries to stay active in his community and with family but when not going some where or with family, he spends his time watching TV.  He continues to live alone but family does assist with some IADLs such as medication management.  Ambulates with a cane outdoors but will ambulate in house without cane. Denies any falls but feels unsteady.  He will occasionally feel dizziness which is not new.  He does mention difficulty tolerating BiPAP, was seen by Dr. DBrett Fairylast month for BiPAP follow-up, having difficulty tolerating mask and just recently obtained gecko skin patch for nasal bridge (had difficulty finding), he is motivated to continue to try and use.    History provided for reference purposes only Update 06/05/2021 JM: Patient returns for initial CPAP follow-up visit as well as stroke follow-up.  Stroke:  Overall stable  without new stroke/TIA symptoms.  Cognition has been stable since prior visit.  Daughter believes word finding difficulty and hesitation has improved since prior visit. Daughter concerned regarding gradual decline of ambulation/balance and occasionally knees giving out. He does have neuropathy.  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  Was working with [facility] back in December for BPPV, he will occasionally do Epley maneuver but feels this may worsen vertigo. he ambulates with a cane. Continues to maintain ADLs independently, daughters assist with IADLs.  Compliant on aspirin and atorvastatin, denies side effects.  Blood pressure today 118/60.    Sleep apnea:  Evaluated by Dr. DBrett Fairy1/26 with prior history of sleep apnea.  Initially underwent split-night study on 2/5 but only reported 8.5 minutes of sleep therefore proceeded with HST.  HST completed 3/20 which showed calculated AHI 69.9/h consistent with severe sleep apnea in conjunction with hypoxia and recommend initiating AutoPap.  AutoPap initiated 3/28. Initially had difficulty tolerating full face mask, now using hybrid mask with improvement of tolerance.  Reports improvement of sleep quality and some increase energy during the day since starting.  Epworth Sleepiness Scale 20/24 (before start 20/24).  Fatigue severity scale 37/63 (before start 35/63).        Initial visit 11/26/2020 JM: Patient being seen for hospital follow up accompanied by his daughter. Overall stable since discharge. She has since returned back to his own home living independently (stayed with family for 1-2 weeks after discharge). He has since returned back to all prior activities. Maintains ADLs independently -  does have maid for more intense cleaning, eats out a lot but will cook simple meals, he reports paying his own bills (although per daughter, his other daughter recently started assisting with this), has not yet returned back to driving  (he was driving locally prior). Denies new or reoccurring stroke/TIA symptoms. Compliant on aspirin and atorvastatin without side effects. Blood pressure today 122/68.    Daughter has multiple other concerns today:   Drooling - present prior to recent stroke but questions worsening after recent stroke. Questions possible difficulty swallowing but does have hx of esophageal stricture needing stretching in the past  BUE Tremor present prior to stroke - not constant - usually occurs when he tries to write or hold an object. No tremors at rest.   Mild cognitive impairment - chronic. no worsening since stroke, short term memory loss main concern  Insomnia - chronic. Takes benadryl nightly (as advised by his granddaughter who is a Software engineer); hx of sleep apnea intolerant to CPAP - no use over the past 2 years  Occasional word finding difficulty - chronic. daughter believes worsening since recent stroke  On ROS sheet which was completed by daughters: Fatigue, blurred vision, loss of vision, easy bruising, easy bleeding, palpitations, swelling of legs, snoring, feeling cold, ringing in ears, spinning sensation, trouble swallowing, constipation, joint pain, rash, urination problems, incontinence, incontinence, allergies, skin sensitivity, memory loss, weakness, dizziness, tremor, insomnia, sleepiness, snoring, depression, too much sleep, not enough sleep, decreased energy, disinterest in activities and racing thoughts   Stroke admission 10/07/2020 [name] is a 88 y.o. male with history of left eye blindness, cancer (left kidney), arthritis, cataract, CKD, DM, glaucoma, HTN, hx of vertigo, neuropathy and prior stroke who presented on 10/07/2020 from home with acute onset of expressive dysphasia (speaking and texting) that started around 8:30am initially resolved but en route to ED via EMS, reoccurrence of expressive aphasia "could not get words out" in conjunction with right facial droop which  both rapidly resolved.  Personally reviewed hospitalization pertinent progress notes, lab work and imaging.  Evaluated by Dr. Leonie Richardson.  Stroke work-up revealed right precentral gyrus infarct embolic possibly secondary to cardioembolic source such as atrial fibrillation (hx documented on neuro note but not other mention of prior hx).  Baseline mild cognitive impairment as well as fall risk with multiple falls and lives alone but medication noncompliance and deemed to not be a good AC candidate there further monitoring not indicated.  Carotid Dopplers no significant stenosis.  EF 50 to 55%.  LDL 13.  A1c 9.3.  On aspirin 81 mg PTA although missed doses frequently and advised to restart aspirin 81 mg daily as well as continuation of atorvastatin 80 mg daily.  HTN stable on amlodipine.  Therapy eval's recommended HH therapies (chronic dizziness/vertigo, with 24-hour supervision and d/c'd to stay with his daughter until further recovery.        PERTINENT IMAGING  MR BRAIN 10/07/2020 IMPRESSION: 1. Small acute infarcts in the right precentral gyrus and the anterior right frontal white matter. No significant edema or mass effect. 2. Remote infarcts in the right inferior cerebellum and right temporal lobe. 3. Mild for age chronic microvascular ischemic disease and atrophy.  MRA HEAD 10/07/2020 IMPRESSION: 1. Artifact rather than ELVO suspected at the Left MCA trifurcation, with MRI pending at this time. But if DWI is positive in the Left MCA territory then an LVO can not be excluded and should be considered. Follow-up CTA Head may be valuable in that  scenario. 2. Otherwise negative for age intracranial MRA.  2D ECHO 10/07/2020 IMPRESSIONS   1. Left ventricular ejection fraction, by estimation, is 50 to 55%. The  left ventricle has low normal function. The left ventricle has no regional  wall motion abnormalities. Left ventricular diastolic parameters are  consistent with Grade I diastolic   dysfunction (impaired relaxation).   2. Right ventricular systolic function is normal. The right ventricular  size is normal.   3. Left atrial size was severely dilated.   4. The mitral valve is normal in structure. No evidence of mitral valve  regurgitation. No evidence of mitral stenosis.   5. The aortic valve is tricuspid. There is mild calcification of the  aortic valve. There is mild thickening of the aortic valve. Aortic valve  regurgitation is trivial. Mild aortic valve sclerosis is present, with no  evidence of aortic valve stenosis.  Aortic valve area, by VTI measures 3.54 cm. Aortic valve mean gradient  measures 4.0 mmHg. Aortic valve Vmax measures 1.22 m/s.   6. Aortic dilatation noted. 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   7. The inferior vena cava is normal in size with greater than 50%  respiratory variability, suggesting right atrial pressure of 3 mmHg.        ROS:   14 system review of systems performed and negative with exception of those listed in HPI  PMH:  Past Medical History:  Diagnosis Date   Arthritis    Blind left eye    Cancer of left kidney ([place])    Cataract    Chronic kidney disease    Constipation    Diabetes mellitus without complication (HCC)    Glaucoma    Hypertension    Neuropathy    Stroke ([place])     PSH:  Past Surgical History:  Procedure Laterality Date   CHOLECYSTECTOMY     LEFT HEART CATH AND CORONARY ANGIOGRAPHY N/A 12/16/2019   Procedure: LEFT HEART CATH AND CORONARY ANGIOGRAPHY;  Surgeon: Martinique, Peter M, MD;  Location: [facility];  Service: Cardiovascular;  Laterality: N/A;   ORIF ANKLE FRACTURE Right 08/13/2019   Procedure: OPEN REDUCTION INTERNAL FIXATION (ORIF) ANKLE FRACTURE;  Surgeon: Renette Butters, MD;  Location: [facility];  Service: Orthopedics;  Laterality: Right;   THYROID SURGERY     TONSILLECTOMY      Social History:  Social History    Socioeconomic History   Marital status: Widowed    Spouse name: Not on file   Number of children: Not on file   Years of education: Not on file   Highest education level: Not on file  Occupational History   Occupation: retired  Tobacco Use   Smoking status: Former    Types: Pipe    Quit date: 1983    Years since quitting: 40.8   Smokeless tobacco: Former  Substance and Sexual Activity   Alcohol use: Yes    Comment: social   Drug use: Never   Sexual activity: Not on file  Other Topics Concern   Not on file  Social History Narrative   Not on file   Social Determinants of Health   Financial Resource Strain: Not on file  Food Insecurity: Not on file  Transportation Needs: Not on file  Physical Activity: Not on file  Stress: Not on file  Social Connections: Not on file  Intimate Partner Violence: Not on file    Family History:  Family History  Problem Relation Age of Onset   Diabetes Mellitus II Mother     Medications:   Current Outpatient Medications on File Prior to Visit  Medication Sig Dispense Refill   acetaminophen (TYLENOL) 325 MG tablet Take 650 mg by mouth every 4 (four) hours as needed for mild pain or headache.     amLODipine (NORVASC) 5 MG tablet Take 1 tablet (5 mg total) by mouth daily. 90 tablet 3   aspirin EC 81 MG EC tablet Take 1 tablet (81 mg total) by mouth daily. Swallow whole. 30 tablet 11   atorvastatin (LIPITOR) 80 MG tablet Take 80 mg by mouth daily.     betamethasone valerate (VALISONE) 0.1 % cream Apply 1 application topically every Monday, Wednesday, and Friday. For psoriasis -  HOLD     BETIMOL 0.5 % ophthalmic solution Apply 1 drop to eye 2 (two) times daily.     cholecalciferol (VITAMIN D3) 25 MCG (1000 UNIT) tablet 1 tablet     diphenhydrAMINE (BENADRYL) 25 MG tablet Take 25 mg by mouth at bedtime.     diphenhydrAMINE HCl, Sleep, (UNISOM SLEEPGELS) 50 MG CAPS Take one caps at desired bedtime po. 30 capsule 0   docusate sodium  (COLACE) 100 MG capsule Take 100 mg by mouth 2 (two) times daily.     famotidine (PEPCID) 20 MG tablet Take 20 mg by mouth at bedtime.     gabapentin (NEURONTIN) 100 MG capsule Take 2 capsules (200 mg total) by mouth at bedtime. 60 capsule 0   glimepiride (AMARYL) 4 MG tablet Take 4 mg by mouth 2 (two) times daily.     insulin glargine (LANTUS) 100 UNIT/ML injection Lantus 45 units every morning, 40 units evening time, 10 mL 11   Insulin Pen Needle 29G X 12MM MISC Use as directed 200 each 0   levothyroxine (SYNTHROID) 125 MCG tablet Take 125 mcg by mouth daily before breakfast.     lidocaine (XYLOCAINE) 2 % solution Use as directed 15 mLs in the mouth or throat as needed for mouth pain.     ONETOUCH ULTRA test strip 1 each 2 (two) times daily.     pantoprazole (PROTONIX) 40 MG tablet Take 40 mg by mouth daily.     Probiotic Product (ALIGN) 4 MG CAPS Take 4 mg by mouth daily as needed (constipation).     tamsulosin (FLOMAX) 0.4 MG CAPS capsule Take 1 capsule (0.4 mg total) by mouth daily. 30 capsule    timolol (TIMOPTIC) 0.5 % ophthalmic solution SMARTSIG:In Eye(s)     triamcinolone cream (KENALOG) 0.1 % Apply 1 application topically as needed.     triamcinolone ointment (KENALOG) 0.1 % Apply topically 2 (two) times daily.     No current facility-administered medications on file prior to visit.    Allergies:   Allergies  Allergen Reactions   Other Other (See Comments)    Other reaction(s): rash   Tape Rash    Other reaction(s): rash      OBJECTIVE:  Physical Exam  Vitals:   12/11/21 1038  BP: (!) 140/73  Pulse: 66  Weight: 231 lb (104.8 kg)  Height: '5\' 11"'$  (1.803 m)    Body mass index is 32.22 kg/m. No results found.   General: well developed, well nourished, very pleasant elderly Caucasian male, seated, in no evident distress Head: head normocephalic and atraumatic.   Neck: supple with no carotid or supraclavicular bruits Cardiovascular: regular rate and rhythm, no  murmurs Musculoskeletal: no deformity Skin:  no rash/petichiae Vascular:  Normal pulses all extremities   Neurologic Exam Mental Status: Awake and fully alert.  Fluent speech and language.  Oriented to place and time. Recent memory impaired and remote memory intact. Attention span, concentration and fund of knowledge mostly appropriate during visit. Mood and affect appropriate.  Cranial Nerves: Pupil briskly reactive to light in OD, OS pupil 42m and nonreactive. OS severe visual loss at baseline.  Extraocular movements full without nystagmus. Visual fields intact in OD. Hearing intact. Facial sensation intact. Very slight left lower facial weakness (more noticeable when smiling).  Tongue, and palate moves normally and symmetrically.  Motor: Normal bulk and tone. Normal strength in all tested extremity muscles Sensory.: intact to touch , pinprick , position and vibratory sensation.  Coordination: Rapid alternating movements normal in all extremities. Finger-to-Richardson mild LUE incoordination and heel-to-shin performed accurately bilaterally. Mild postural tremor L>R UE.  No evidence of resting tremor or cogwheel rigidity Gait and Station: Arises from chair without difficulty. Stance is slightly hunched. Gait demonstrates decreased stride length and step height and mild imbalance with use of cane. Tandem walk and heel toe not attempted.  Appropriate arm swing (although needs cane for ambulation).  Some difficulty with turns. Reflexes: 1+ and symmetric. Toes downgoing.         ASSESSMENT: WCaydn Justenis a 88y.o. year old male with right precentral gyrus stroke on 90/17/5102 embolic secondary to unknown source - documented possibly in setting of A. Fib but unable to verify prior history. Not a AC candidate d/t high fall risk, cognitive impairment, lives alone and does not consistently take medications (d/t cognitive impairment) therefore no indication for prolonged cardiac monitoring for possible  A. fib. Vascular risk factors include HTN, HLD, DM with neuropathy, mild cognitive impairment and prior stroke history (R cerebellum and R temporal lobe).      PLAN:  Recurrent strokes:  continue aspirin 81 mg daily  and atorvastatin 80 mg daily for secondary stroke prevention.   Discussed secondary stroke prevention measures and importance of close PCP follow up for aggressive stroke risk factor management including BP goal<130/90, HLD with LDL goal<70 and DM with A1c.<7.  I have gone over the pathophysiology of stroke, warning signs and symptoms, risk factors and their management in some detail with instructions to go to the closest emergency room for symptoms of concern.  Severe sleep apnea: HST 03/2021 severe sleep apnea with hypoxia.  CPAP titration 06/2021 transition to BiPAP.  Which is seen by Dr. DBrett Fairy10/2, has f/u scheduled 04/2021.  Discussed importance of nightly usage as this is likely contributing to decreased energy levels as well as memory difficulties  Gait impairment: Likely multifactorial with history of prior strokes, arthritis in multiple joints, neuropathy, age and sedentary.  Limited benefit previously with PT.  As concern for worsening, could consider additional PT but unsure how beneficial at this point, patient will call in the future if interested in pursuing. Discussed use of cane at all times and may  need to consider use of RW or rollator walker if needed for more long distance ambulation and fall prevention.  Discussed importance of routine exercise and to avoid prolonged sitting     Follow up in 6 months with Dr. SLeonie Manor call earlier if needed   CC:  PCP: BKristen Loader FNP    I spent 38 minutes of face-to-face and non-face-to-face time with patient and daughter.  This included previsit chart review, lab review, study review, order entry,  electronic health record documentation, patient and daughter education and discussion regarding above diagnoses and  treatment plan and answered all other questions to patient and daughter satisfaction  Frann Rider, [facility]  [facility] [address] [place], [place] [address]  Phone [phone] Fax [phone] Note: This document was prepared with digital dictation and possible smart phrase technology. Any transcriptional errors that result from this process are unintentional.

## 2021-12-11 ENCOUNTER — Encounter: Payer: Self-pay | Admitting: Adult Health

## 2021-12-11 ENCOUNTER — Ambulatory Visit: Payer: Medicare Other | Admitting: Adult Health

## 2021-12-11 VITALS — BP 140/73 | HR 66 | Ht 71.0 in | Wt 231.0 lb

## 2021-12-11 DIAGNOSIS — I639 Cerebral infarction, unspecified: Secondary | ICD-10-CM

## 2021-12-11 DIAGNOSIS — G4730 Sleep apnea, unspecified: Secondary | ICD-10-CM

## 2021-12-11 DIAGNOSIS — R269 Unspecified abnormalities of gait and mobility: Secondary | ICD-10-CM | POA: Diagnosis not present

## 2021-12-11 NOTE — Patient Instructions (Addendum)
Please let me know if you would like to restart working with therapies  Continue aspirin and atorvastatin for secondary stroke prevention measures  Continue to follow with PCP for aggressive stroke risk factor management     Follow up in 6 months or call earlier if needed

## 2021-12-17 DIAGNOSIS — G4733 Obstructive sleep apnea (adult) (pediatric): Secondary | ICD-10-CM | POA: Diagnosis not present

## 2022-01-06 DIAGNOSIS — I10 Essential (primary) hypertension: Secondary | ICD-10-CM | POA: Diagnosis not present

## 2022-01-06 DIAGNOSIS — E785 Hyperlipidemia, unspecified: Secondary | ICD-10-CM | POA: Diagnosis not present

## 2022-01-06 DIAGNOSIS — E1169 Type 2 diabetes mellitus with other specified complication: Secondary | ICD-10-CM | POA: Diagnosis not present

## 2022-01-06 DIAGNOSIS — N184 Chronic kidney disease, stage 4 (severe): Secondary | ICD-10-CM | POA: Diagnosis not present

## 2022-01-06 DIAGNOSIS — E039 Hypothyroidism, unspecified: Secondary | ICD-10-CM | POA: Diagnosis not present

## 2022-01-16 DIAGNOSIS — G4733 Obstructive sleep apnea (adult) (pediatric): Secondary | ICD-10-CM | POA: Diagnosis not present

## 2022-02-05 DIAGNOSIS — D649 Anemia, unspecified: Secondary | ICD-10-CM | POA: Diagnosis not present

## 2022-02-05 DIAGNOSIS — Z0000 Encounter for general adult medical examination without abnormal findings: Secondary | ICD-10-CM | POA: Diagnosis not present

## 2022-02-05 DIAGNOSIS — Z20822 Contact with and (suspected) exposure to covid-19: Secondary | ICD-10-CM | POA: Diagnosis not present

## 2022-02-05 DIAGNOSIS — E039 Hypothyroidism, unspecified: Secondary | ICD-10-CM | POA: Diagnosis not present

## 2022-02-05 DIAGNOSIS — I10 Essential (primary) hypertension: Secondary | ICD-10-CM | POA: Diagnosis not present

## 2022-02-05 DIAGNOSIS — K219 Gastro-esophageal reflux disease without esophagitis: Secondary | ICD-10-CM | POA: Diagnosis not present

## 2022-02-05 DIAGNOSIS — E785 Hyperlipidemia, unspecified: Secondary | ICD-10-CM | POA: Diagnosis not present

## 2022-02-05 DIAGNOSIS — N184 Chronic kidney disease, stage 4 (severe): Secondary | ICD-10-CM | POA: Diagnosis not present

## 2022-02-05 DIAGNOSIS — N2581 Secondary hyperparathyroidism of renal origin: Secondary | ICD-10-CM | POA: Diagnosis not present

## 2022-02-05 DIAGNOSIS — E1169 Type 2 diabetes mellitus with other specified complication: Secondary | ICD-10-CM | POA: Diagnosis not present

## 2022-02-05 DIAGNOSIS — I2510 Atherosclerotic heart disease of native coronary artery without angina pectoris: Secondary | ICD-10-CM | POA: Diagnosis not present

## 2022-02-08 ENCOUNTER — Other Ambulatory Visit: Payer: Self-pay

## 2022-02-08 ENCOUNTER — Emergency Department (HOSPITAL_COMMUNITY): Payer: Medicare Other

## 2022-02-08 ENCOUNTER — Encounter (HOSPITAL_COMMUNITY): Payer: Self-pay

## 2022-02-08 ENCOUNTER — Inpatient Hospital Stay (HOSPITAL_COMMUNITY)
Admit: 2022-02-08 | Discharge: 2022-02-10 | DRG: 312 | Disposition: A | Discharge status: Home / Self Care | Payer: Medicare Other | Attending: Internal Medicine | Admitting: Internal Medicine

## 2022-02-08 DIAGNOSIS — E1122 Type 2 diabetes mellitus with diabetic chronic kidney disease: Secondary | ICD-10-CM | POA: Diagnosis not present

## 2022-02-08 DIAGNOSIS — E1140 Type 2 diabetes mellitus with diabetic neuropathy, unspecified: Secondary | ICD-10-CM | POA: Diagnosis not present

## 2022-02-08 DIAGNOSIS — Z7982 Long term (current) use of aspirin: Secondary | ICD-10-CM | POA: Diagnosis not present

## 2022-02-08 DIAGNOSIS — R55 Syncope and collapse: Secondary | ICD-10-CM | POA: Diagnosis not present

## 2022-02-08 DIAGNOSIS — Z7189 Other specified counseling: Secondary | ICD-10-CM

## 2022-02-08 DIAGNOSIS — Z7984 Long term (current) use of oral hypoglycemic drugs: Secondary | ICD-10-CM

## 2022-02-08 DIAGNOSIS — E1129 Type 2 diabetes mellitus with other diabetic kidney complication: Secondary | ICD-10-CM | POA: Diagnosis present

## 2022-02-08 DIAGNOSIS — Z8673 Personal history of transient ischemic attack (TIA), and cerebral infarction without residual deficits: Secondary | ICD-10-CM

## 2022-02-08 DIAGNOSIS — E039 Hypothyroidism, unspecified: Secondary | ICD-10-CM | POA: Diagnosis not present

## 2022-02-08 DIAGNOSIS — I499 Cardiac arrhythmia, unspecified: Secondary | ICD-10-CM | POA: Diagnosis not present

## 2022-02-08 DIAGNOSIS — Z66 Do not resuscitate: Secondary | ICD-10-CM | POA: Diagnosis present

## 2022-02-08 DIAGNOSIS — I2510 Atherosclerotic heart disease of native coronary artery without angina pectoris: Secondary | ICD-10-CM | POA: Diagnosis not present

## 2022-02-08 DIAGNOSIS — I25119 Atherosclerotic heart disease of native coronary artery with unspecified angina pectoris: Secondary | ICD-10-CM | POA: Diagnosis not present

## 2022-02-08 DIAGNOSIS — G4733 Obstructive sleep apnea (adult) (pediatric): Secondary | ICD-10-CM | POA: Diagnosis present

## 2022-02-08 DIAGNOSIS — R42 Dizziness and giddiness: Secondary | ICD-10-CM | POA: Diagnosis not present

## 2022-02-08 DIAGNOSIS — Z888 Allergy status to other drugs, medicaments and biological substances status: Secondary | ICD-10-CM

## 2022-02-08 DIAGNOSIS — R531 Weakness: Secondary | ICD-10-CM | POA: Diagnosis not present

## 2022-02-08 DIAGNOSIS — I10 Essential (primary) hypertension: Secondary | ICD-10-CM | POA: Diagnosis present

## 2022-02-08 DIAGNOSIS — N184 Chronic kidney disease, stage 4 (severe): Secondary | ICD-10-CM | POA: Diagnosis not present

## 2022-02-08 DIAGNOSIS — I455 Other specified heart block: Secondary | ICD-10-CM | POA: Diagnosis not present

## 2022-02-08 DIAGNOSIS — R001 Bradycardia, unspecified: Secondary | ICD-10-CM

## 2022-02-08 DIAGNOSIS — H5462 Unqualified visual loss, left eye, normal vision right eye: Secondary | ICD-10-CM | POA: Diagnosis not present

## 2022-02-08 DIAGNOSIS — I495 Sick sinus syndrome: Secondary | ICD-10-CM | POA: Diagnosis present

## 2022-02-08 DIAGNOSIS — R079 Chest pain, unspecified: Secondary | ICD-10-CM

## 2022-02-08 DIAGNOSIS — Z85528 Personal history of other malignant neoplasm of kidney: Secondary | ICD-10-CM | POA: Diagnosis not present

## 2022-02-08 DIAGNOSIS — M1990 Unspecified osteoarthritis, unspecified site: Secondary | ICD-10-CM | POA: Diagnosis present

## 2022-02-08 DIAGNOSIS — Z79899 Other long term (current) drug therapy: Secondary | ICD-10-CM

## 2022-02-08 DIAGNOSIS — I129 Hypertensive chronic kidney disease with stage 1 through stage 4 chronic kidney disease, or unspecified chronic kidney disease: Secondary | ICD-10-CM | POA: Diagnosis present

## 2022-02-08 DIAGNOSIS — Z87891 Personal history of nicotine dependence: Secondary | ICD-10-CM | POA: Diagnosis not present

## 2022-02-08 DIAGNOSIS — R6889 Other general symptoms and signs: Secondary | ICD-10-CM | POA: Diagnosis not present

## 2022-02-08 DIAGNOSIS — G459 Transient cerebral ischemic attack, unspecified: Secondary | ICD-10-CM | POA: Diagnosis not present

## 2022-02-08 DIAGNOSIS — K5909 Other constipation: Secondary | ICD-10-CM | POA: Diagnosis not present

## 2022-02-08 DIAGNOSIS — I440 Atrioventricular block, first degree: Secondary | ICD-10-CM | POA: Diagnosis not present

## 2022-02-08 DIAGNOSIS — Z794 Long term (current) use of insulin: Secondary | ICD-10-CM

## 2022-02-08 DIAGNOSIS — Z833 Family history of diabetes mellitus: Secondary | ICD-10-CM

## 2022-02-08 DIAGNOSIS — E785 Hyperlipidemia, unspecified: Secondary | ICD-10-CM | POA: Diagnosis not present

## 2022-02-08 DIAGNOSIS — K219 Gastro-esophageal reflux disease without esophagitis: Secondary | ICD-10-CM | POA: Diagnosis present

## 2022-02-08 DIAGNOSIS — R404 Transient alteration of awareness: Secondary | ICD-10-CM | POA: Diagnosis not present

## 2022-02-08 DIAGNOSIS — E782 Mixed hyperlipidemia: Secondary | ICD-10-CM | POA: Diagnosis not present

## 2022-02-08 DIAGNOSIS — Z743 Need for continuous supervision: Secondary | ICD-10-CM | POA: Diagnosis not present

## 2022-02-08 DIAGNOSIS — Z79890 Hormone replacement therapy (postmenopausal): Secondary | ICD-10-CM

## 2022-02-08 DIAGNOSIS — N400 Enlarged prostate without lower urinary tract symptoms: Secondary | ICD-10-CM | POA: Diagnosis present

## 2022-02-08 DIAGNOSIS — H401130 Primary open-angle glaucoma, bilateral, stage unspecified: Secondary | ICD-10-CM | POA: Diagnosis present

## 2022-02-08 DIAGNOSIS — E1165 Type 2 diabetes mellitus with hyperglycemia: Secondary | ICD-10-CM | POA: Diagnosis not present

## 2022-02-08 LAB — CBC WITH DIFFERENTIAL/PLATELET
Abs Immature Granulocytes: 0.05 10*3/uL (ref 0.00–0.07)
Basophils Absolute: 0.1 10*3/uL (ref 0.0–0.1)
Basophils Relative: 2 %
Eosinophils Absolute: 0.9 10*3/uL — ABNORMAL HIGH (ref 0.0–0.5)
Eosinophils Relative: 13 %
HCT: 35.8 % — ABNORMAL LOW (ref 39.0–52.0)
Hemoglobin: 12.4 g/dL — ABNORMAL LOW (ref 13.0–17.0)
Immature Granulocytes: 1 %
Lymphocytes Relative: 16 %
Lymphs Abs: 1.1 10*3/uL (ref 0.7–4.0)
MCH: 29.5 pg (ref 26.0–34.0)
MCHC: 34.6 g/dL (ref 30.0–36.0)
MCV: 85.2 fL (ref 80.0–100.0)
Monocytes Absolute: 0.6 10*3/uL (ref 0.1–1.0)
Monocytes Relative: 9 %
Neutro Abs: 3.9 10*3/uL (ref 1.7–7.7)
Neutrophils Relative %: 59 %
Platelets: 154 10*3/uL (ref 150–400)
RBC: 4.2 MIL/uL — ABNORMAL LOW (ref 4.22–5.81)
RDW: 13.2 % (ref 11.5–15.5)
WBC: 6.5 10*3/uL (ref 4.0–10.5)
nRBC: 0 % (ref 0.0–0.2)

## 2022-02-08 LAB — COMPREHENSIVE METABOLIC PANEL
ALT: 16 U/L (ref 0–44)
AST: 18 U/L (ref 15–41)
Albumin: 3.3 g/dL — ABNORMAL LOW (ref 3.5–5.0)
Alkaline Phosphatase: 65 U/L (ref 38–126)
Anion gap: 8 (ref 5–15)
BUN: 38 mg/dL — ABNORMAL HIGH (ref 8–23)
CO2: 22 mmol/L (ref 22–32)
Calcium: 9.5 mg/dL (ref 8.9–10.3)
Chloride: 107 mmol/L (ref 98–111)
Creatinine, Ser: 2.4 mg/dL — ABNORMAL HIGH (ref 0.61–1.24)
GFR, Estimated: 24 mL/min — ABNORMAL LOW (ref >60)
Glucose, Bld: 181 mg/dL — ABNORMAL HIGH (ref 70–99)
Potassium: 4.5 mmol/L (ref 3.5–5.1)
Sodium: 137 mmol/L (ref 135–145)
Total Bilirubin: 0.8 mg/dL (ref 0.3–1.2)
Total Protein: 5.9 g/dL — ABNORMAL LOW (ref 6.5–8.1)

## 2022-02-08 LAB — CBG MONITORING, ED
Glucose-Capillary: 183 mg/dL — ABNORMAL HIGH (ref 70–99)
Glucose-Capillary: 152 mg/dL — ABNORMAL HIGH (ref 70–99)
Glucose-Capillary: 196 mg/dL — ABNORMAL HIGH (ref 70–99)

## 2022-02-08 LAB — TROPONIN I (HIGH SENSITIVITY)
Troponin I (High Sensitivity): 57 ng/L — ABNORMAL HIGH (ref <18)
Troponin I (High Sensitivity): 58 ng/L — ABNORMAL HIGH (ref <18)

## 2022-02-08 LAB — BRAIN NATRIURETIC PEPTIDE: B Natriuretic Peptide: 87.8 pg/mL (ref 0.0–100.0)

## 2022-02-08 MED ORDER — TIMOLOL MALEATE 0.5 % OP SOLN
1.0000 [drp] | Freq: Two times a day (BID) | OPHTHALMIC | Status: DC
  Administered 2022-02-10: 1 [drp] via OPHTHALMIC
  Filled 2022-02-08 (×2): qty 5

## 2022-02-08 MED ORDER — LACTATED RINGERS IV SOLN: INTRAVENOUS | Status: DC

## 2022-02-08 MED ORDER — GABAPENTIN 100 MG PO CAPS
200.0000 mg | ORAL_CAPSULE | Freq: Every day | ORAL | Status: DC
  Administered 2022-02-08 – 2022-02-09 (×2): 200 mg via ORAL
  Filled 2022-02-08 (×2): qty 2

## 2022-02-08 MED ORDER — RISAQUAD PO CAPS: 1.0000 | ORAL_CAPSULE | Freq: Every day | ORAL | Status: DC | PRN

## 2022-02-08 MED ORDER — SENNA 8.6 MG PO TABS
1.0000 | ORAL_TABLET | Freq: Two times a day (BID) | ORAL | Status: DC
  Administered 2022-02-08 – 2022-02-10 (×4): 8.6 mg via ORAL
  Filled 2022-02-08 (×4): qty 1

## 2022-02-08 MED ORDER — PANTOPRAZOLE SODIUM 40 MG PO TBEC
40.0000 mg | DELAYED_RELEASE_TABLET | Freq: Every day | ORAL | Status: DC
  Administered 2022-02-09 – 2022-02-10 (×2): 40 mg via ORAL
  Filled 2022-02-08 (×2): qty 1

## 2022-02-08 MED ORDER — TIMOLOL HEMIHYDRATE 0.5 % OP SOLN: 1.0000 [drp] | Freq: Two times a day (BID) | OPHTHALMIC | Status: DC

## 2022-02-08 MED ORDER — INSULIN GLARGINE-YFGN 100 UNIT/ML ~~LOC~~ SOLN
20.0000 [IU] | Freq: Two times a day (BID) | SUBCUTANEOUS | Status: DC
  Administered 2022-02-08 – 2022-02-10 (×4): 20 [IU] via SUBCUTANEOUS
  Filled 2022-02-08 (×6): qty 0.2

## 2022-02-08 MED ORDER — LEVOTHYROXINE SODIUM 25 MCG PO TABS
125.0000 ug | ORAL_TABLET | Freq: Every day | ORAL | Status: DC
  Administered 2022-02-09 – 2022-02-10 (×2): 125 ug via ORAL
  Filled 2022-02-08 (×2): qty 1

## 2022-02-08 MED ORDER — ALIGN 4 MG PO CAPS: 4.0000 mg | ORAL_CAPSULE | Freq: Every day | ORAL | Status: DC | PRN

## 2022-02-08 MED ORDER — ONDANSETRON HCL 4 MG/2ML IJ SOLN: 4.0000 mg | Freq: Four times a day (QID) | INTRAMUSCULAR | Status: DC | PRN

## 2022-02-08 MED ORDER — TAMSULOSIN HCL 0.4 MG PO CAPS
0.8000 mg | ORAL_CAPSULE | Freq: Every day | ORAL | Status: DC
  Administered 2022-02-08 – 2022-02-09 (×2): 0.8 mg via ORAL
  Filled 2022-02-08 (×2): qty 2

## 2022-02-08 MED ORDER — ATORVASTATIN CALCIUM 80 MG PO TABS
80.0000 mg | ORAL_TABLET | Freq: Every day | ORAL | Status: DC
  Administered 2022-02-09 – 2022-02-10 (×2): 80 mg via ORAL
  Filled 2022-02-08 (×2): qty 1

## 2022-02-08 MED ORDER — ASPIRIN 81 MG PO TBEC
81.0000 mg | DELAYED_RELEASE_TABLET | Freq: Every day | ORAL | Status: DC
  Administered 2022-02-09 – 2022-02-10 (×2): 81 mg via ORAL
  Filled 2022-02-08 (×2): qty 1

## 2022-02-08 MED ORDER — FAMOTIDINE 20 MG PO TABS
20.0000 mg | ORAL_TABLET | Freq: Every day | ORAL | Status: DC
  Administered 2022-02-08 – 2022-02-09 (×2): 20 mg via ORAL
  Filled 2022-02-08 (×2): qty 1

## 2022-02-08 MED ORDER — ONDANSETRON HCL 4 MG PO TABS: 4.0000 mg | ORAL_TABLET | Freq: Four times a day (QID) | ORAL | Status: DC | PRN

## 2022-02-08 MED ORDER — POLYETHYLENE GLYCOL 3350 17 G PO PACK
17.0000 g | PACK | Freq: Two times a day (BID) | ORAL | Status: DC
  Administered 2022-02-08 – 2022-02-09 (×3): 17 g via ORAL
  Filled 2022-02-08 (×3): qty 1

## 2022-02-08 MED ORDER — INSULIN ASPART 100 UNIT/ML IJ SOLN
0.0000 [IU] | Freq: Three times a day (TID) | INTRAMUSCULAR | Status: DC
  Administered 2022-02-08: 2 [IU] via SUBCUTANEOUS
  Administered 2022-02-09: 1 [IU] via SUBCUTANEOUS
  Administered 2022-02-10 (×2): 2 [IU] via SUBCUTANEOUS

## 2022-02-08 MED ORDER — DOCUSATE SODIUM 100 MG PO CAPS
100.0000 mg | ORAL_CAPSULE | Freq: Two times a day (BID) | ORAL | Status: DC
  Administered 2022-02-08 – 2022-02-10 (×4): 100 mg via ORAL
  Filled 2022-02-08 (×4): qty 1

## 2022-02-08 MED ORDER — FLEET ENEMA 7-19 GM/118ML RE ENEM: 1.0000 | ENEMA | Freq: Once | RECTAL | Status: DC | PRN

## 2022-02-08 MED ORDER — ACETAMINOPHEN 325 MG PO TABS: 650.0000 mg | ORAL_TABLET | Freq: Four times a day (QID) | ORAL | Status: DC | PRN

## 2022-02-08 MED ORDER — ACETAMINOPHEN 650 MG RE SUPP: 650.0000 mg | Freq: Four times a day (QID) | RECTAL | Status: DC | PRN

## 2022-02-08 MED ORDER — BISACODYL 5 MG PO TBEC: 5.0000 mg | DELAYED_RELEASE_TABLET | Freq: Every day | ORAL | Status: DC | PRN

## 2022-02-08 MED ORDER — SODIUM CHLORIDE 0.9% FLUSH
3.0000 mL | Freq: Two times a day (BID) | INTRAVENOUS | Status: DC
  Administered 2022-02-09 – 2022-02-10 (×3): 3 mL via INTRAVENOUS

## 2022-02-08 MED ORDER — ENOXAPARIN SODIUM 30 MG/0.3ML IJ SOSY
30.0000 mg | PREFILLED_SYRINGE | INTRAMUSCULAR | Status: DC
  Administered 2022-02-08 – 2022-02-09 (×2): 30 mg via SUBCUTANEOUS
  Filled 2022-02-08 (×2): qty 0.3

## 2022-02-08 NOTE — ED Provider Notes (Signed)
[facility] EMERGENCY DEPARTMENT Provider Note  CSN: [id number] Arrival date & time: 02/08/22 1010  Chief Complaint(s) Weakness  HPI Martin Richardson is a 89 y.o. male with PMH left kidney cancer, T2DM, previous CVA and TIAs, sleep apnea, first-degree heart block, tachybradycardia syndrome who presents emergency department for evaluation of weakness and loss of coordination.  Patient states that at approximately 830 this morning he had a 10-minute episode of centralized chest discomfort, back pain and neck pain with sense of impending doom.  He states that he was trying to grab his phone but he felt like his "arms would not do what I asked them to do".  He was ultimately able to hit his life alert button and EMS arrived 30 minutes later.  Symptoms had been resolved.  Here in the emergency department patient is endorsing no symptoms and currently denies chest pain, shortness of breath, Donnell pain, nausea vomiting or other systemic symptoms.   Past Medical History Past Medical History:  Diagnosis Date   Arthritis    Blind left eye    Cancer of left kidney ([place])    Cataract    Chronic kidney disease    Constipation    Diabetes mellitus without complication ([place])    Glaucoma    Hypertension    Neuropathy    Stroke [facility])    Patient Active Problem List   Diagnosis Date Noted   Complex sleep apnea syndrome 07/14/2021   Atherosclerotic heart disease of native coronary artery without angina pectoris 04/25/2021   Constipation 04/25/2021   Gastroesophageal reflux disease 04/25/2021   Hyperlipidemia, unspecified 04/25/2021   Insomnia 04/25/2021   Long term (current) use of insulin ([place]) 04/25/2021   Neuropathy 04/25/2021   Primary open-angle glaucoma, bilateral, stage unspecified 04/25/2021   Secondary hyperparathyroidism ([place]) 04/25/2021   Severe obstructive sleep apnea-hypopnea syndrome 04/17/2021   Malignant neoplasm of kidney ([place]) 03/19/2021   Hydronephrosis due  to obstruction of ureter 02/21/2021   History of stroke in prior 3 months 02/21/2021   Excessive daytime sleepiness 02/21/2021   Absolute anemia 02/21/2021   Acute CVA (cerebrovascular accident) ([place]) 10/07/2020   Hypothyroidism (acquired) 10/07/2020   Unstable angina ([place])    Chest pain 12/15/2019   Stage 3b chronic kidney disease ([place])    DM neuropathy, painful ([place]) 08/18/2019   Ankle fracture 08/12/2019   CKD (chronic kidney disease), stage IV ([place]) 08/12/2019   DM (diabetes mellitus), type 2 with renal complications ([place]) 25/36/6440   History of stroke 08/12/2019   Essential hypertension 08/12/2019   Closed right ankle fracture 08/12/2019   Benign hypertensive renal disease 07/22/2018   Benign prostatic hyperplasia 07/22/2018   Renal mass 07/22/2018   Chronic angle-closure glaucoma, bilateral 01/02/2012   Nuclear sclerotic cataract of left eye 01/02/2012   Amblyopia 07/29/2011   Cataract 07/29/2011   POAG (primary open-angle glaucoma) 07/29/2011   Home Medication(s) Prior to Admission medications   Medication Sig Start Date End Date Taking? Authorizing Provider  acetaminophen (TYLENOL) 325 MG tablet Take 650 mg by mouth every 4 (four) hours as needed for mild pain or headache.    [provider]  amLODipine (NORVASC) 5 MG tablet Take 1 tablet (5 mg total) by mouth daily. 12/24/20   Belva Crome, MD  aspirin EC 81 MG EC tablet Take 1 tablet (81 mg total) by mouth daily. Swallow whole. 10/09/20   Elgergawy, Silver Huguenin, MD  atorvastatin (LIPITOR) 80 MG tablet Take 80 mg by mouth daily. 06/13/20  [provider]  betamethasone valerate (VALISONE) 0.1 % cream Apply 1 application topically every Monday, Wednesday, and Friday. For psoriasis -  HOLD    [provider]  BETIMOL 0.5 % ophthalmic solution Apply 1 drop to eye 2 (two) times daily. 06/20/21   [provider]  cholecalciferol (VITAMIN D3) 25 MCG (1000 UNIT) tablet 1 tablet    [provider]  diphenhydrAMINE (BENADRYL) 25 MG tablet Take 25 mg by mouth at bedtime.    [provider]  diphenhydrAMINE HCl, Sleep, (UNISOM SLEEPGELS) 50 MG CAPS Take one caps at desired bedtime po. 03/19/21   Dohmeier, Asencion Partridge, MD  docusate sodium (COLACE) 100 MG capsule Take 100 mg by mouth 2 (two) times daily.    [provider]  famotidine (PEPCID) 20 MG tablet Take 20 mg by mouth at bedtime.    [provider]  gabapentin (NEURONTIN) 100 MG capsule Take 2 capsules (200 mg total) by mouth at bedtime. 08/22/19   Allie Bossier, MD  glimepiride (AMARYL) 4 MG tablet Take 4 mg by mouth 2 (two) times daily. 11/07/20   [provider]  insulin glargine (LANTUS) 100 UNIT/ML injection Lantus 45 units every morning, 40 units evening time, 10/08/20   Elgergawy, Silver Huguenin, MD  Insulin Pen Needle 29G X 12MM MISC Use as directed 08/22/19   Allie Bossier, MD  levothyroxine (SYNTHROID) 125 MCG tablet Take 125 mcg by mouth daily before breakfast.    [provider]  lidocaine (XYLOCAINE) 2 % solution Use as directed 15 mLs in the mouth or throat as needed for mouth pain.    [provider]  [facility] ULTRA test strip 1 each 2 (two) times daily. 01/09/20   [provider]  pantoprazole (PROTONIX) 40 MG tablet Take 40 mg by mouth daily. 10/17/20   [provider]  Probiotic Product (ALIGN) 4 MG CAPS Take 4 mg by mouth daily as needed (constipation).    [provider]  tamsulosin (FLOMAX) 0.4 MG CAPS capsule Take 1 capsule (0.4 mg total) by mouth daily. 10/08/20   Elgergawy, Silver Huguenin, MD  timolol (TIMOPTIC) 0.5 % ophthalmic solution SMARTSIG:In Eye(s) 02/12/20   [provider]  triamcinolone cream (KENALOG) 0.1 % Apply 1 application topically as needed.    [provider]  triamcinolone ointment (KENALOG) 0.1 % Apply topically 2 (two) times daily. 10/15/21   [provider]                                                                                                                                     Past Surgical History Past Surgical History:  Procedure Laterality Date   CHOLECYSTECTOMY     LEFT HEART CATH AND CORONARY ANGIOGRAPHY N/A 12/16/2019   Procedure: LEFT HEART CATH AND CORONARY ANGIOGRAPHY;  Surgeon: Martinique, Peter M, MD;  Location: [facility];  Service: Cardiovascular;  Laterality: N/A;  ORIF ANKLE FRACTURE Right 08/13/2019   Procedure: OPEN REDUCTION INTERNAL FIXATION (ORIF) ANKLE FRACTURE;  Surgeon: Renette Butters, MD;  Location: [facility];  Service: Orthopedics;  Laterality: Right;   THYROID SURGERY     TONSILLECTOMY     Family History Family History  Problem Relation Age of Onset   Diabetes Mellitus II Mother     Social History Social History   Tobacco Use   Smoking status: Former    Types: Pipe    Quit date: 1983    Years since quitting: 41.0   Smokeless tobacco: Former  Substance Use Topics   Alcohol use: Yes    Comment: social   Drug use: Never   Allergies Other and Tape  Review of Systems Review of Systems  Musculoskeletal:  Positive for back pain and neck pain.  Neurological:        Difficulty with coordination    Physical Exam Vital Signs  I have reviewed the triage vital signs BP (!) 149/75   Pulse (!) 52   Temp (!) 97.5 F (36.4 C)   Resp 16   Ht '5\' 11"'$  (1.803 m)   Wt 103.4 kg   SpO2 93%   BMI 31.80 kg/m   Physical Exam Constitutional:      General: He is not in acute distress.    Appearance: Normal appearance.  HENT:     Head: Normocephalic and atraumatic.     Nose: No congestion or rhinorrhea.  Eyes:     General:        Right eye: No discharge.        Left eye: No discharge.     Extraocular Movements: Extraocular movements intact.     Pupils: Pupils are equal, round, and reactive to light.  Cardiovascular:     Rate and Rhythm: Regular rhythm. Bradycardia present.     Heart sounds: No murmur heard. Pulmonary:      Effort: No respiratory distress.     Breath sounds: No wheezing or rales.  Abdominal:     General: There is no distension.     Tenderness: There is no abdominal tenderness.  Musculoskeletal:        General: Normal range of motion.     Cervical back: Normal range of motion.  Skin:    General: Skin is warm and dry.  Neurological:     General: No focal deficit present.     Mental Status: He is alert.     ED Results and Treatments Labs (all labs ordered are listed, but only abnormal results are displayed) Labs Reviewed  COMPREHENSIVE METABOLIC PANEL - Abnormal; Notable for the following components:      Result Value   Glucose, Bld 181 (*)    BUN 38 (*)    Creatinine, Ser 2.40 (*)    Total Protein 5.9 (*)    Albumin 3.3 (*)    GFR, Estimated 24 (*)    All other components within normal limits  CBC WITH DIFFERENTIAL/PLATELET - Abnormal; Notable for the following components:   RBC 4.20 (*)    Hemoglobin 12.4 (*)    HCT 35.8 (*)    Eosinophils Absolute 0.9 (*)    All other components within normal limits  CBG MONITORING, ED - Abnormal; Notable for the following components:   Glucose-Capillary 183 (*)    All other components within normal limits  TROPONIN I (HIGH SENSITIVITY) - Abnormal; Notable for the following components:   Troponin I (High Sensitivity) 57 (*)  All other components within normal limits  TROPONIN I (HIGH SENSITIVITY) - Abnormal; Notable for the following components:   Troponin I (High Sensitivity) 58 (*)    All other components within normal limits  BRAIN NATRIURETIC PEPTIDE                                                                                                                          Radiology CT Head Wo Contrast  Result Date: 02/08/2022 CLINICAL DATA:  Transient ischemic attack (TIA) EXAM: CT HEAD WITHOUT CONTRAST TECHNIQUE: Contiguous axial images were obtained from the base of the skull through the vertex without intravenous contrast.  RADIATION DOSE REDUCTION: This exam was performed according to the departmental dose-optimization program which includes automated exposure control, adjustment of the mA and/or kV according to patient size and/or use of iterative reconstruction technique. COMPARISON:  10/07/2020 FINDINGS: Brain: No evidence of acute infarction, hemorrhage, hydrocephalus, extra-axial collection or mass lesion/mass effect. Patchy low-density changes within the periventricular and subcortical white matter compatible with chronic microvascular ischemic change. Mild-moderate diffuse cerebral volume loss. Vascular: Atherosclerotic calcifications involving the large vessels of the skull base. No unexpected hyperdense vessel. Skull: Normal. Negative for fracture or focal lesion. Sinuses/Orbits: No acute finding. Other: None. IMPRESSION: 1. No acute intracranial findings. 2. Chronic microvascular ischemic change and cerebral volume loss. Electronically Signed   By: Davina Poke D.O.   On: 02/08/2022 13:38   DG Chest 2 View  Result Date: 02/08/2022 CLINICAL DATA:  89 year old male with sudden onset weakness. Chest pain. EXAM: CHEST - 2 VIEW COMPARISON:  Portable chest 09/26/2021 and earlier. FINDINGS: Semi upright AP and lateral views at 1049 hours. Stable lung volumes compared to last year, more normal on the initial lateral view. Stable cardiac size and mediastinal contours. Cardiac size at the upper limits of normal. Visualized tracheal air column is within normal limits. Chronic left thoracic inlet surgical clips. No pneumothorax, pulmonary edema, pleural effusion or confluent pulmonary opacity. No acute osseous abnormality identified. Negative visible bowel gas. Abdominal Calcified aortic atherosclerosis. IMPRESSION: No acute cardiopulmonary abnormality. Abdominal  Aortic Atherosclerosis (ICD10-I70.0). Electronically Signed   By: Genevie Ann M.D.   On: 02/08/2022 11:03    Pertinent labs & imaging results that were available during  my care of the patient were reviewed by me and considered in my medical decision making (see MDM for details).  Medications Ordered in ED Medications - No data to display  Procedures Procedures  (including critical care time)  Medical Decision Making / ED Course   This patient presents to the ED for concern of generalized weakness and loss of coordination, this involves an extensive number of treatment options, and is a complaint that carries with it a high risk of complications and morbidity.  The differential diagnosis includes TIA, symptomatic bradycardia, heart block, electrolyte abnormality, encephalopathy  MDM: Patient seen emergency room for evaluation of multiple complaints as described above.  Physical exam reveals a regular bradycardia but is otherwise unremarkable.  Neuroexam is reassuringly normal with no focal motor or sensory deficits, no cranial nerve deficits.  Laboratory evaluation with a hemoglobin of 12.4, BUN 38, creatinine 2.4 which is patient's baseline, high sensitive troponin elevated to 57 but delta troponin is flat at 58, BNP 87.8, chest x-ray unremarkable, CT head unremarkable.  I spoke briefly with Dr. Leonel Ramsay of neurology who has a lower suspicion for TIA given sudden onset and resolution of bilateral symptoms that do not localize and are not consistent with his previous stroke pattern.  Given patient's associated sense of impending doom with this episode and ECG today showing a very long PR interval, suspect the patient's episode today may have been a prolonged sinus pause versus sinus bradycardia and be cardiac in origin.  I spoke with Dr. Johney Frame who recommends heart monitoring with either inpatient observation or home with a Zio patch mailed to the home.  The patient lives alone and is understandably nervous about pursuing this in the  outpatient setting.  Thus patient will require hospital admission for high risk syncope and was admitted to the hospitalist with cardiology assistance.   Additional history obtained: -Additional history obtained from daughter -External records from outside source obtained and reviewed including: Chart review including previous notes, labs, imaging, consultation notes   Lab Tests: -I ordered, reviewed, and interpreted labs.   The pertinent results include:   Labs Reviewed  COMPREHENSIVE METABOLIC PANEL - Abnormal; Notable for the following components:      Result Value   Glucose, Bld 181 (*)    BUN 38 (*)    Creatinine, Ser 2.40 (*)    Total Protein 5.9 (*)    Albumin 3.3 (*)    GFR, Estimated 24 (*)    All other components within normal limits  CBC WITH DIFFERENTIAL/PLATELET - Abnormal; Notable for the following components:   RBC 4.20 (*)    Hemoglobin 12.4 (*)    HCT 35.8 (*)    Eosinophils Absolute 0.9 (*)    All other components within normal limits  CBG MONITORING, ED - Abnormal; Notable for the following components:   Glucose-Capillary 183 (*)    All other components within normal limits  TROPONIN I (HIGH SENSITIVITY) - Abnormal; Notable for the following components:   Troponin I (High Sensitivity) 57 (*)    All other components within normal limits  TROPONIN I (HIGH SENSITIVITY) - Abnormal; Notable for the following components:   Troponin I (High Sensitivity) 58 (*)    All other components within normal limits  BRAIN NATRIURETIC PEPTIDE      EKG   EKG Interpretation  Date/Time:  Saturday February 08 2022 10:21:52 EST Ventricular Rate:  60 PR Interval:    QRS Duration: 107 QT Interval:  453 QTC Calculation: 453 R Axis:   -6 Text Interpretation: sinus brady with 1st degree heart block very long PR interval Abnormal R-wave progression, late transition No significant change since last tracing Confirmed by [place], Badger ([id number])  on 02/08/2022 6:16:09 PM          Imaging Studies ordered: I ordered imaging studies including chest x-ray, CT head I independently visualized and interpreted imaging. I agree with the radiologist interpretation   Medicines ordered and prescription drug management: No orders of the defined types were placed in this encounter.   -I have reviewed the patients home medicines and have made adjustments as needed  Critical interventions none  Consultations Obtained: I requested consultation with the cardiologist on-call Dr. Johney Frame,  and discussed lab and imaging findings as well as pertinent plan - they recommend: Cardiac monitoring   Cardiac Monitoring: The patient was maintained on a cardiac monitor.  I personally viewed and interpreted the cardiac monitored which showed an underlying rhythm of: Sinus bradycardia with prolonged PR interval  Social Determinants of Health:  Factors impacting patients care include: Lives at home alone   Reevaluation: After the interventions noted above, I reevaluated the patient and found that they have :stayed the same  Co morbidities that complicate the patient evaluation  Past Medical History:  Diagnosis Date   Arthritis    Blind left eye    Cancer of left kidney ([place])    Cataract    Chronic kidney disease    Constipation    Diabetes mellitus without complication ([place])    Glaucoma    Hypertension    Neuropathy    Stroke [facility])       Dispostion: I considered admission for this patient, and due to high risk syncope with heart block patient will require hospital admission     Final Clinical Impression(s) / ED Diagnoses Final diagnoses:  None     '@PCDICTATION'$ @    Teressa Lower, MD 02/08/22 1818

## 2022-02-08 NOTE — Consult Note (Signed)
Consultation Note Date: 02/08/2022   Patient Name: Martin Richardson  DOB: July 22, 1925  MRN: [id number]  Age / Sex: 89 y.o., male  PCP: Kristen Loader, FNP Referring Physician: Karmen Bongo, MD  Reason for Consultation: Establishing goals of care  HPI/Patient Profile: 89 y.o. male  with past medical history of DM, HTN, glaucoma, OSA, tachy-brady syndrome without pacer, and prior recurrent CVA, HLD, CKD3-4, anemia, admitted on 02/08/2022 with syncope and dyscoordination.   Patient admitted for monitoring given concern for cardiogenic syncope, with plans for echo. PMT has been consulted to assist with goals of care conversation.  Clinical Assessment and Goals of Care:  I have reviewed medical records including EPIC notes, labs and imaging, received report from RN, assessed the patient and then met at the bedside with patient's 3 daughters to discuss diagnosis prognosis, GOC, EOL wishes, disposition and options.  I introduced Palliative Medicine as specialized medical care for people living with serious illness. It focuses on providing relief from the symptoms and stress of a serious illness. The goal is to improve quality of life for both the patient and the family.  We discussed a brief life review of the patient and then focused on their current illness.   I attempted to elicit values and goals of care important to the patient.    Medical History Review and Understanding:  Reviewed patient's history of syncopal episodes and progressive decline. Patient and his family have a good understanding of his current illness.  Social History: Patient is widowed after his wife of 60+ years died with MS about 5 years ago. He has 3 daughters. He is well supported by family, friends, neighbors, church members and club members. He is Fish farm manager. He previously enjoyed fishing and hunting but has been unable to do  health concerns worsening since he moved back to [place] a couple of years ago. He previously worked for [employer] and moved to [place] for work, now returned since his daughter no longer lives there.  Functional and Nutritional State: Patient has a cane, walker, and wheelchair as needed. He states he has "gone downhill" for about 2 years. He was unable to attend [place] conference this year due to health decline. He still drives within a 5 mile radius.   Palliative Symptoms: Weakness  Advance Directives: A detailed discussion regarding advanced directives was had. Patient's daughter Martin Richardson is HCPOA and has a copy of documentation. Will attempt to scan into medical records when obtained.   Code Status: Concepts specific to code status, artifical feeding and hydration, and rehospitalization were considered and discussed. DNR confirmed.   Discussion: Patient reports satisfaction with his current quality of life and he is very appreciative of the support from his daughters. He becomes emotional throughout our conversation, voicing that he is thrilled his family is dedicating to helping him reach his goal of remaining independent for as long as possible. They are all on the same page that they would prefer avoidance of aggressive interventions, that he should be treated as well as he treated his wife before she passed (this was the same emphasis for her, she was ultimately on hospice for 6-8 months with [facility]). We reviewed plans for further diagnostics and they are interested in how this will guide his treatment plan moving forward. At this time, they are not too concerned with his ability to return to his condo safely. They would be agreeable to hiring caregivers or having him move into one of their homes if needed.  Patient's daughter Martin Richardson previously worked with [employer] until a few years ago and would strongly prefer them for outpatient palliative or hospice when that time came. Reviewed their  new palliative care criteria and that he likely would not be eligible yet. Spoke with [place] privately in the hall and she shares her concern that while patient has just agreed he does not want aggressive measures to keep him alive as long as possible, he is occasionally agreeable to interventions such as HD. He does not have the best health literacy. Reassured that I would continue to assist with ongoing discussions and solidify goals of care in the coming days.   The difference between aggressive medical intervention and comfort care was considered in light of the patient's goals of care. Hospice and Palliative Care services outpatient were explained.  Discussed the importance of continued conversation with family and the medical providers regarding overall plan of care and treatment options, ensuring decisions are within the context of the patient's values and GOCs.   Questions and concerns were addressed.  Hard Choices booklet left for review. The family was encouraged to call with questions or concerns.  PMT will continue to support holistically.   SUMMARY OF RECOMMENDATIONS   -Continue DNR -Continue current care, avoid aggressive interventions -Ongoing goals of care discussions pending clinical course -Goal is to return home and live independently as long as he can -Psychosocial and emotional support provided -PMT will continue to folow and support  Prognosis:  Unable to determine  Discharge Planning: To Be Determined      Primary Diagnoses: Present on Admission:  Syncope, cardiogenic  Hypothyroidism (acquired)  Hyperlipidemia, unspecified  Essential hypertension  DM (diabetes mellitus), type 2 with renal complications ([place])  CKD (chronic kidney disease), stage IV ([place])  DNR (do not resuscitate)   Physical Exam Vitals and nursing note reviewed.  Constitutional:      General: He is not in acute distress. Cardiovascular:     Rate and Rhythm: Normal rate.  Pulmonary:      Effort: Pulmonary effort is normal.  Skin:    General: Skin is cool and dry.  Neurological:     Mental Status: He is alert and oriented to person, place, and time.  Psychiatric:        Mood and Affect: Mood normal.        Behavior: Behavior normal.     Vital Signs: BP (!) 111/94   Pulse 60   Temp 97.7 F (36.5 C) (Oral)   Resp (!) 24   Ht '5\' 11"'$  (1.803 m)   Wt 103.4 kg   SpO2 98%   BMI 31.80 kg/m  Pain Scale: 0-10   Pain Score: 0-No pain   SpO2: SpO2: 98 % O2 Device:SpO2: 98 % O2 Flow Rate: .   Palliative Assessment/Data: ~50-60%     Total time: I spent 97 minutes in the care of the patient today in the above activities and documenting the encounter.  MDM: high    Kathyleen Radice Johnnette Litter, PA-C  Palliative Medicine Team Team phone # [phone]  Thank you for allowing the Palliative Medicine Team to assist in the care of this patient. Please utilize secure chat with additional questions, if there is no response within 30 minutes please call the above phone number.  Palliative Medicine Team providers are available by phone from 7am to 7pm daily and can be reached through the team cell phone.  Should this patient require assistance outside of these hours, please call the patient's  attending physician.

## 2022-02-08 NOTE — ED Triage Notes (Signed)
Pt BIB [facility] from home c/o sudden onset of weakness and issues with coordination that he was trying to get his life alert out of his shirt but felt like he could not work his hands. Pt LKW was 530 and was fine that is when he woke up. Symptoms started at 830 this morning. When EMS arrived about 30 mins later pts symptoms had resolved.

## 2022-02-08 NOTE — H&P (Signed)
History and Physical    Patient: Martin Richardson AJO:[id number] DOB: March 06, 1925 DOA: 02/08/2022 DOS: the patient was seen and examined on 02/08/2022 PCP: Kristen Loader, FNP  Patient coming from: Home - lives alone; NOK: Daughter, [name], [phone]   Chief Complaint: Weakness, coordination issues  HPI: Martin Richardson is a 89 y.o. 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  He reports that he thought that he was having a stroke.  He ate breakfast and suddenly became weak and confused.  He sat down and was unable to use the phone, couldn't get his arms and hands to work.  He pushed his Life Alert finally and called for help.  His symptoms lasted at least 20-25 minutes (daughter says much shorter, told the other doctor 5-10 minutes).  He feels normal now.  He has had 2 prior strokes.  He has had this feeling a time or two before but never this severe (once was seen at [facility] (09/26/21) and one prior time when diagnosed with tachy-brady syndrome.  He has had issues with this - HR up to 120 and down to 48.  He has worn a heart monitor for a few weeks.  They discussed a possible pacemaker but the frequency was not severe enough to proceed.  He has chronic constipation, had a BM yesterday and had felt the urge to go just before the episode.  He takes daily stool softener, Miralax, prn prune juice, glycerin suppositories prn.    ER Course:  High risk syncope.  Episode feeling unable to move arms/legs for about 10 minutes.  Ultimately able to push life alert.  Huge first degree heart block, chronic.  ?bradycardia as cause, cardiology cannot place heart monitor on weekend.  Suggest monitoring overnight and have cards consult.     Review of Systems: As mentioned in the history of present illness. All other systems reviewed and are negative. Past Medical History:  Diagnosis Date    Arthritis    Blind left eye    Cancer of left kidney ([place])    Cataract    Chronic kidney disease    Constipation    Diabetes mellitus without complication (HCC)    Glaucoma    Hypertension    Neuropathy    Stroke [facility])    Past Surgical History:  Procedure Laterality Date   CHOLECYSTECTOMY     LEFT HEART CATH AND CORONARY ANGIOGRAPHY N/A 12/16/2019   Procedure: LEFT HEART CATH AND CORONARY ANGIOGRAPHY;  Surgeon: Martinique, Peter M, MD;  Location: [place] [facility];  Service: Cardiovascular;  Laterality: N/A;   ORIF ANKLE FRACTURE Right 08/13/2019   Procedure: OPEN REDUCTION INTERNAL FIXATION (ORIF) ANKLE FRACTURE;  Surgeon: Renette Butters, MD;  Location: [place];  Service: Orthopedics;  Laterality: Right;   THYROID SURGERY     TONSILLECTOMY     Social History:  reports that he quit smoking about 41 years ago. His smoking use included pipe. He has quit using smokeless tobacco. He reports current alcohol use. He reports that he does not use drugs.  Allergies  Allergen Reactions   Other Other (See Comments)    Other reaction(s): rash   Tape Rash    Other reaction(s): rash    Family History  Problem Relation Age of Onset   Diabetes Mellitus II Mother     Prior to Admission medications   Medication Sig Start Date End Date Taking? Authorizing Provider  acetaminophen (TYLENOL) 325 MG tablet Take 650 mg by mouth every 4 (four) hours as needed for mild pain or headache.    [provider]  amLODipine (NORVASC) 5 MG tablet Take 1 tablet (5 mg total) by mouth daily. 12/24/20   Belva Crome, MD  aspirin EC 81 MG EC tablet Take 1 tablet (81 mg total) by mouth daily. Swallow whole. 10/09/20   Elgergawy, Silver Huguenin, MD  atorvastatin (LIPITOR) 80 MG tablet Take 80 mg by mouth daily. 06/13/20   [provider]  betamethasone valerate (VALISONE) 0.1 % cream Apply 1 application topically every Monday, Wednesday, and Friday. For psoriasis -  HOLD    [provider]   BETIMOL 0.5 % ophthalmic solution Apply 1 drop to eye 2 (two) times daily. 06/20/21   [provider]  cholecalciferol (VITAMIN D3) 25 MCG (1000 UNIT) tablet 1 tablet    [provider]  diphenhydrAMINE (BENADRYL) 25 MG tablet Take 25 mg by mouth at bedtime.    [provider]  diphenhydrAMINE HCl, Sleep, (UNISOM SLEEPGELS) 50 MG CAPS Take one caps at desired bedtime po. 03/19/21   Dohmeier, Asencion Partridge, MD  docusate sodium (COLACE) 100 MG capsule Take 100 mg by mouth 2 (two) times daily.    [provider]  famotidine (PEPCID) 20 MG tablet Take 20 mg by mouth at bedtime.    [provider]  gabapentin (NEURONTIN) 100 MG capsule Take 2 capsules (200 mg total) by mouth at bedtime. 08/22/19   Allie Bossier, MD  glimepiride (AMARYL) 4 MG tablet Take 4 mg by mouth 2 (two) times daily. 11/07/20   [provider]  insulin glargine (LANTUS) 100 UNIT/ML injection Lantus 45 units every morning, 40 units evening time, 10/08/20   Elgergawy, Silver Huguenin, MD  Insulin Pen Needle 29G X 12MM MISC Use as directed 08/22/19   Allie Bossier, MD  levothyroxine (SYNTHROID) 125 MCG tablet Take 125 mcg by mouth daily before breakfast.    [provider]  lidocaine (XYLOCAINE) 2 % solution Use as directed 15 mLs in the mouth or throat as needed for mouth pain.    [provider]  [facility] ULTRA test strip 1 each 2 (two) times daily. 01/09/20   [provider]  pantoprazole (PROTONIX) 40 MG tablet Take 40 mg by mouth daily. 10/17/20   [provider]  Probiotic Product (ALIGN) 4 MG CAPS Take 4 mg by mouth daily as needed (constipation).    [provider]  tamsulosin (FLOMAX) 0.4 MG CAPS capsule Take 1 capsule (0.4 mg total) by mouth daily. 10/08/20   Elgergawy, Silver Huguenin, MD  timolol (TIMOPTIC) 0.5 % ophthalmic solution SMARTSIG:In Eye(s) 02/12/20   [provider]  triamcinolone cream (KENALOG) 0.1 % Apply 1 application  topically as needed.    [provider]  triamcinolone ointment (KENALOG) 0.1 % Apply topically 2 (two) times daily. 10/15/21   [provider]    Physical Exam: Vitals:   02/08/22 1330 02/08/22 1404 02/08/22 1702 02/08/22 1801  BP:  (!) 149/75 (!) 111/94   Pulse: (!) 56 (!) 52 60   Resp: 15 16 (!) 24   Temp:  (!) 97.5 F (36.4 C)  97.7 F (36.5 C)  TempSrc:    Oral  SpO2: 91% 93% 98%   Weight:      Height:       General:  Appears calm and comfortable and is in NAD Eyes:  PERRL, EOMI, normal lids, iris ENT:  grossly normal hearing, lips & tongue, mmm; appropriate dentition Neck:  no LAD, masses or thyromegaly Cardiovascular:  RR with bradycardia, no m/r/g. No LE edema.  Respiratory:   CTA bilaterally with no wheezes/rales/rhonchi.  Normal respiratory effort. Abdomen:  soft, NT, ND Skin:  no rash or induration seen on limited exam Musculoskeletal:  grossly normal tone BUE/BLE, good ROM, no bony abnormality Psychiatric:  grossly normal mood and affect, speech fluent and appropriate, AOx3 Neurologic:  CN 2-12 grossly intact, moves all extremities in coordinated fashion   Radiological Exams on Admission: Independently reviewed - see discussion in A/P where applicable  CT Head Wo Contrast  Result Date: 02/08/2022 CLINICAL DATA:  Transient ischemic attack (TIA) EXAM: CT HEAD WITHOUT CONTRAST TECHNIQUE: Contiguous axial images were obtained from the base of the skull through the vertex without intravenous contrast. RADIATION DOSE REDUCTION: This exam was performed according to the departmental dose-optimization program which includes automated exposure control, adjustment of the mA and/or kV according to patient size and/or use of iterative reconstruction technique. COMPARISON:  10/07/2020 FINDINGS: Brain: No evidence of acute infarction, hemorrhage, hydrocephalus, extra-axial collection or mass lesion/mass effect. Patchy low-density changes within the periventricular  and subcortical white matter compatible with chronic microvascular ischemic change. Mild-moderate diffuse cerebral volume loss. Vascular: Atherosclerotic calcifications involving the large vessels of the skull base. No unexpected hyperdense vessel. Skull: Normal. Negative for fracture or focal lesion. Sinuses/Orbits: No acute finding. Other: None. IMPRESSION: 1. No acute intracranial findings. 2. Chronic microvascular ischemic change and cerebral volume loss. Electronically Signed   By: Davina Poke D.O.   On: 02/08/2022 13:38   DG Chest 2 View  Result Date: 02/08/2022 CLINICAL DATA:  89 year old male with sudden onset weakness. Chest pain. EXAM: CHEST - 2 VIEW COMPARISON:  Portable chest 09/26/2021 and earlier. FINDINGS: Semi upright AP and lateral views at 1049 hours. Stable lung volumes compared to last year, more normal on the initial lateral view. Stable cardiac size and mediastinal contours. Cardiac size at the upper limits of normal. Visualized tracheal air column is within normal limits. Chronic left thoracic inlet surgical clips. No pneumothorax, pulmonary edema, pleural effusion or confluent pulmonary opacity. No acute osseous abnormality identified. Negative visible bowel gas. Abdominal Calcified aortic atherosclerosis. IMPRESSION: No acute cardiopulmonary abnormality. Abdominal  Aortic Atherosclerosis (ICD10-I70.0). Electronically Signed   By: Genevie Ann M.D.   On: 02/08/2022 11:03    EKG: Independently reviewed.  Junctional rhythm with rate 60; nonspecific ST changes with no evidence of acute ischemia   Labs on Admission: I have personally reviewed the available labs and imaging studies at the time of the admission.  Pertinent labs:    Glucose 181 BUN 38/Creatinine 2.40/GFR 24 - stable HS troponin 57, 58 BNP 87.8 Unremarkable CBC   Assessment and Plan: Principal Problem:   Syncope, cardiogenic Active Problems:   CKD (chronic kidney disease), stage IV (HCC)   DM (diabetes  mellitus), type 2 with renal complications (HCC)   History of stroke   Essential hypertension   Hypothyroidism (acquired)   Hyperlipidemia, unspecified   DNR (do not resuscitate)    Syncope -Patient presenting with a transient episode of weakness, inability to call for help -Concern for cardiogenic syncope, particularly in the setting of very prolonged PR interval and bradycardia -Should cardiology think it is unrelated to his heart, MRI (given h/o recurrent CVA) and EEG (possible focal seizures) could be considered - but neurologic cause is considered less likely at this time -This patient is at moderate/high risk for  serious outcome and thus should be observed overnight on telemetry in the hospital. -2d echo is part of the extended work-up when cardiac etiology is suspected and is ordered -Cardiology is consulting -Neuro checks  -PT/OT eval and treat -Hold all potentially nodal-blocking agents -Echo ordered  DM -Last A1c was slightly above goal of 8 (9.3) -hold Glimepride -Continue Lantus but at decreased dose -Cover with sensitive-scale SSI   HTN -Hold amlodipine (daughter says he has taken maybe one time in 3 years anyway) -Continue tamsulosin for BPH as well -Goal BP is <160  HLD -Continue atorvastatin  Glaucoma -Continue timolol  Hypothyroidism -Continue Synthroid  CKD -Stage 4 CKD, appears to be progressing but generally stable -Attempt to avoid nephrotoxic medications -Recheck BMP in AM   Goals of care -Patient is DNR -Palliative care discussed and daughter thinks a consult would be helpful given 3 daughters in different places who all want to be involved in his care -General goal is to treat the treatable and make sure comfort is a priority -Possible MCI, delirium precautions ordered   Advance Care Planning:   Code Status: DNR   Consults: Cardiology, palliative care  DVT Prophylaxis: Lovenox  Family Communication: Daughter was present throughout  evaluation  Severity of Illness: The appropriate patient status for this patient is OBSERVATION. Observation status is judged to be reasonable and necessary in order to provide the required intensity of service to ensure the patient's safety. The patient's presenting symptoms, physical exam findings, and initial radiographic and laboratory data in the context of their medical condition is felt to place them at decreased risk for further clinical deterioration. Furthermore, it is anticipated that the patient will be medically stable for discharge from the hospital within 2 midnights of admission.   Author: Karmen Bongo, MD 02/08/2022 6:32 PM  For on call review [email or web address].

## 2022-02-08 NOTE — Consult Note (Signed)
Cardiology Consultation   Patient ID: Kohler Pellerito MRN: [id number]; DOB: 01/12/1926  Admit date: 02/08/2022 Date of Consult: 02/08/2022  PCP:  Kristen Loader, [place] Providers Cardiologist:  Sinclair Grooms, MD   {    Patient Profile:   Simuel Stebner is a 89 y.o. male with a hx of recurrent ischemic CVA, hypertension, hyperlipidemia, type 2 diabetes, sinus bradycardia, hypothyroidism, CKD stage III-IV, anemia, first-degree AV block who is being seen 02/08/2022 for the evaluation of syncope at the request of Dr. Lorin Mercy.  History of Present Illness:   Mr. Matty was above past medical history presented to the ER today complaining sudden onset of generalized weakness and loss of coordination of his hands.  Symptoms started around 8:30 AM.  Symptoms spontaneous resolved after EMS arrived for 30 minutes.  Per chart review, patient follows Dr. [name] outpatient, has known first-degree AV block with baseline sinus bradycardia.  He had chest pain 2021, echo showed LVEF 60 to 65%. Left heart cath 12/16/2019 showed two-vessel obstructive CAD without critical stenosis and normal LVEDP, 50% stenosis mid LAD and 80% distal LAD, 85% D2, 70% mid circumflex.  Patient was recommended medical therapy with aspirin, Imdur, statin.  He had issue with dizziness and hypotension 01/04/2020, carvedilol was discontinued.  He was last seen by Dr. [name] in office 06/11/2020, reports an episode of feeling lightheaded, heavy arm, heart racing with heart rate 115, diaphoresis, and low blood pressure.  He was concern for tachybradycardia syndrome versus atrial fibrillation, 4-week monitor was recommended.  Subsequent event monitor revealed sinus rhythm/bradycardia with first-degree AV block, average heart rate 62, occasional episode of accelerated junctional rhythm, no atrial fibrillation.  Patient was informed that he has tachybradycardia syndrome, as long as he has no symptoms, no medical  intervention required at the time.  Last echo was obtained 10/07/2020 when he was admitted for acute ischemic CVA, revealed LVEF 50 to 55%, grade 1 DD, normal RV, severe LAE, trivial AI, mild dilatation of aortic root 46 mm.  He presented on this admission for episode of sudden onset weakness with bilateral shoulder heaviness. He was sitting down at the time but was having the urge to have a BM before it started. He reached for his phone but was unable to dial and therefore pressed his medical alert button and EMS was called. By the time EMS arrived, his symptoms had resolved.   Admission diagnostic today revealed elevated creatinine 2.4, BUN 38, eGFR 24, and glucose of 181.  Albumin 3.3.  BNP 87.8.  High sensitive troponin 57>58.  CBC differential revealed hemoglobin 12.4, elevated eosinophils. Chest x-ray revealed no acute finding.  CT head revealed no acute intracranial finding, chronic microvascular ischemic change and cerebral volume loss. EKG revealed sinus rhythm, first-degree AV block, poor R wave progression.   Cardiology is now consulted for further evaluation of presyncope.  Past Medical History:  Diagnosis Date   Arthritis    Blind left eye    Cancer of left kidney ([place])    Cataract    Chronic kidney disease    Constipation    Diabetes mellitus without complication ([place])    Glaucoma    Hypertension    Neuropathy    Stroke [facility])     Past Surgical History:  Procedure Laterality Date   CHOLECYSTECTOMY     LEFT HEART CATH AND CORONARY ANGIOGRAPHY N/A 12/16/2019   Procedure: LEFT HEART CATH AND CORONARY ANGIOGRAPHY;  Surgeon: Martinique, Peter M,  MD;  Location: [facility];  Service: Cardiovascular;  Laterality: N/A;   ORIF ANKLE FRACTURE Right 08/13/2019   Procedure: OPEN REDUCTION INTERNAL FIXATION (ORIF) ANKLE FRACTURE;  Surgeon: Renette Butters, MD;  Location: [facility];  Service: Orthopedics;  Laterality: Right;   THYROID SURGERY     TONSILLECTOMY       Home Medications:   Prior to Admission medications   Medication Sig Start Date End Date Taking? Authorizing Provider  acetaminophen (TYLENOL) 325 MG tablet Take 650 mg by mouth every 4 (four) hours as needed for mild pain or headache.    [provider]  amLODipine (NORVASC) 5 MG tablet Take 1 tablet (5 mg total) by mouth daily. 12/24/20   Belva Crome, MD  aspirin EC 81 MG EC tablet Take 1 tablet (81 mg total) by mouth daily. Swallow whole. 10/09/20   Elgergawy, Silver Huguenin, MD  atorvastatin (LIPITOR) 80 MG tablet Take 80 mg by mouth daily. 06/13/20   [provider]  betamethasone valerate (VALISONE) 0.1 % cream Apply 1 application topically every Monday, Wednesday, and Friday. For psoriasis -  HOLD    [provider]  BETIMOL 0.5 % ophthalmic solution Apply 1 drop to eye 2 (two) times daily. 06/20/21   [provider]  cholecalciferol (VITAMIN D3) 25 MCG (1000 UNIT) tablet 1 tablet    [provider]  diphenhydrAMINE (BENADRYL) 25 MG tablet Take 25 mg by mouth at bedtime.    [provider]  diphenhydrAMINE HCl, Sleep, (UNISOM SLEEPGELS) 50 MG CAPS Take one caps at desired bedtime po. 03/19/21   Dohmeier, Asencion Partridge, MD  docusate sodium (COLACE) 100 MG capsule Take 100 mg by mouth 2 (two) times daily.    [provider]  famotidine (PEPCID) 20 MG tablet Take 20 mg by mouth at bedtime.    [provider]  gabapentin (NEURONTIN) 100 MG capsule Take 2 capsules (200 mg total) by mouth at bedtime. 08/22/19   Allie Bossier, MD  glimepiride (AMARYL) 4 MG tablet Take 4 mg by mouth 2 (two) times daily. 11/07/20   [provider]  insulin glargine (LANTUS) 100 UNIT/ML injection Lantus 45 units every morning, 40 units evening time, 10/08/20   Elgergawy, Silver Huguenin, MD  Insulin Pen Needle 29G X 12MM MISC Use as directed 08/22/19   Allie Bossier, MD  levothyroxine (SYNTHROID) 125 MCG tablet Take 125 mcg by mouth daily before breakfast.    [provider]  lidocaine (XYLOCAINE) 2 % solution Use as directed 15 mLs in the mouth or throat as needed for mouth pain.    [provider]  [facility] ULTRA test strip 1 each 2 (two) times daily. 01/09/20   [provider]  pantoprazole (PROTONIX) 40 MG tablet Take 40 mg by mouth daily. 10/17/20   [provider]  Probiotic Product (ALIGN) 4 MG CAPS Take 4 mg by mouth daily as needed (constipation).    [provider]  tamsulosin (FLOMAX) 0.4 MG CAPS capsule Take 1 capsule (0.4 mg total) by mouth daily. 10/08/20   Elgergawy, Silver Huguenin, MD  timolol (TIMOPTIC) 0.5 % ophthalmic solution SMARTSIG:In Eye(s) 02/12/20   [provider]  triamcinolone cream (KENALOG) 0.1 % Apply 1 application topically as needed.    [provider]  triamcinolone ointment (KENALOG) 0.1 % Apply topically 2 (two) times daily. 10/15/21   [provider]    Inpatient Medications: Scheduled Meds:  Continuous Infusions:  PRN Meds:   Allergies:  Allergies  Allergen Reactions   Other Other (See Comments)    Other reaction(s): rash   Tape Rash    Other reaction(s): rash    Social History:   Social History   Socioeconomic History   Marital status: Widowed    Spouse name: Not on file   Number of children: Not on file   Years of education: Not on file   Highest education level: Not on file  Occupational History   Occupation: retired  Tobacco Use   Smoking status: Former    Types: Pipe    Quit date: 1983    Years since quitting: 41.0   Smokeless tobacco: Former  Substance and Sexual Activity   Alcohol use: Yes    Comment: social   Drug use: Never   Sexual activity: Not on file  Other Topics Concern   Not on file  Social History Narrative   Not on file   Social Determinants of Health   Financial Resource Strain: Not on file  Food Insecurity: Not on file  Transportation Needs: Not on file  Physical Activity: Not on file  Stress: Not on  file  Social Connections: Not on file  Intimate Partner Violence: Not on file    Family History:    Family History  Problem Relation Age of Onset   Diabetes Mellitus II Mother      ROS:  Please see the history of present illness.  All other ROS reviewed and negative.     Physical Exam/Data:   Vitals:   02/08/22 1300 02/08/22 1315 02/08/22 1330 02/08/22 1404  BP: (!) 142/74   (!) 149/75  Pulse: (!) 57 (!) 47 (!) 56 (!) 52  Resp: '17 13 15 16  '$ Temp:    (!) 97.5 F (36.4 C)  SpO2: 100% 99% 91% 93%  Weight:      Height:       No intake or output data in the 24 hours ending 02/08/22 1601    02/08/2022   10:22 AM 12/11/2021   10:38 AM 10/28/2021    1:27 PM  Last 3 Weights  Weight (lbs) 228 lb 231 lb 230 lb  Weight (kg) 103.42 kg 104.781 kg 104.327 kg     Body mass index is 31.8 kg/m.  General:  Well nourished, well developed, in no acute distress HEENT: normal Neck: no JVD Vascular: No carotid bruits; Distal pulses 2+ bilaterally Cardiac:  bradycardic, regular,  Lungs:  clear to auscultation bilaterally, no wheezing, rhonchi or rales  Abd: soft, nontender, no hepatomegaly  Ext: no edema Musculoskeletal:  Trace edema with R>L (chronic), warm Skin: warm and dry  Neuro:  CNs 2-12 intact, no focal abnormalities noted Psych:  Normal affect   EKG:  The EKG was personally reviewed and demonstrates:  sinus bradycardia with significant first degree AVB  Telemetry:  Telemetry was personally reviewed and demonstrates:  sinus bradycardia   Relevant CV Studies:  Echo from 10/07/20:  1. Left ventricular ejection fraction, by estimation, is 50 to 55%. The  left ventricle has low normal function. The left ventricle has no regional  wall motion abnormalities. Left ventricular diastolic parameters are  consistent with Grade I diastolic  dysfunction (impaired relaxation).   2. Right ventricular systolic function is normal. The right ventricular  size is normal.   3. Left atrial  size was severely dilated.   4. The mitral valve is normal in structure. No evidence of mitral valve  regurgitation. No evidence of mitral stenosis.  5. The aortic valve is tricuspid. There is mild calcification of the  aortic valve. There is mild thickening of the aortic valve. Aortic valve  regurgitation is trivial. Mild aortic valve sclerosis is present, with no  evidence of aortic valve stenosis.  Aortic valve area, by VTI measures 3.54 cm. Aortic valve mean gradient  measures 4.0 mmHg. Aortic valve Vmax measures 1.22 m/s.   6. Aortic dilatation noted. 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   7. The inferior vena cava is normal in size with greater than 50%  respiratory variability, suggesting right atrial pressure of 3 mmHg.    4-week event monitor 06/14/2020:  Overall rhythm is NSR and Sinus bradycardia with 1st degree AV block Average HR 62 bpm Occasional episodes of accelerated junctional rhythm No atrial fibrillation No complaints reported   Asymptomatic Tachy-Brady syndrome without symptoms or need for therapy. Does have AV conduction system disease and may be at risk for high grade block. No current indication for pacing.    Left heart cath 12/16/2019:  Mid LM to Dist LM lesion is 20% stenosed. Mid LAD lesion is 50% stenosed. Dist LAD lesion is 80% stenosed. 2nd Diag lesion is 85% stenosed. Mid Cx lesion is 70% stenosed. LV end diastolic pressure is normal.   1. 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 Normal LVEDP   Plan: recommend medical management.    Laboratory Data:  High Sensitivity Troponin:   Recent Labs  Lab 02/08/22 1027 02/08/22 1246  TROPONINIHS 57* 58*     Chemistry Recent Labs  Lab 02/08/22 1027  NA 137  K 4.5  CL 107  CO2 22  GLUCOSE 181*  BUN 38*  CREATININE 2.40*  CALCIUM 9.5  GFRNONAA 24*  ANIONGAP 8    Recent Labs  Lab 02/08/22 1027  PROT 5.9*   ALBUMIN 3.3*  AST 18  ALT 16  ALKPHOS 65  BILITOT 0.8   Lipids No results for input(s): "CHOL", "TRIG", "HDL", "LABVLDL", "LDLCALC", "CHOLHDL" in the last 168 hours.  Hematology Recent Labs  Lab 02/08/22 1027  WBC 6.5  RBC 4.20*  HGB 12.4*  HCT 35.8*  MCV 85.2  MCH 29.5  MCHC 34.6  RDW 13.2  PLT 154   Thyroid No results for input(s): "TSH", "FREET4" in the last 168 hours.  BNP Recent Labs  Lab 02/08/22 1034  BNP 87.8    DDimer No results for input(s): "DDIMER" in the last 168 hours.   Radiology/Studies:  CT Head Wo Contrast  Result Date: 02/08/2022 CLINICAL DATA:  Transient ischemic attack (TIA) EXAM: CT HEAD WITHOUT CONTRAST TECHNIQUE: Contiguous axial images were obtained from the base of the skull through the vertex without intravenous contrast. RADIATION DOSE REDUCTION: This exam was performed according to the departmental dose-optimization program which includes automated exposure control, adjustment of the mA and/or kV according to patient size and/or use of iterative reconstruction technique. COMPARISON:  10/07/2020 FINDINGS: Brain: No evidence of acute infarction, hemorrhage, hydrocephalus, extra-axial collection or mass lesion/mass effect. Patchy low-density changes within the periventricular and subcortical white matter compatible with chronic microvascular ischemic change. Mild-moderate diffuse cerebral volume loss. Vascular: Atherosclerotic calcifications involving the large vessels of the skull base. No unexpected hyperdense vessel. Skull: Normal. Negative for fracture or focal lesion. Sinuses/Orbits: No acute finding. Other: None. IMPRESSION: 1. No acute intracranial findings. 2. Chronic microvascular ischemic change and cerebral volume loss. Electronically Signed   By: Davina Poke D.O.  On: 02/08/2022 13:38   DG Chest 2 View  Result Date: 02/08/2022 CLINICAL DATA:  89 year old male with sudden onset weakness. Chest pain. EXAM: CHEST - 2 VIEW COMPARISON:   Portable chest 09/26/2021 and earlier. FINDINGS: Semi upright AP and lateral views at 1049 hours. Stable lung volumes compared to last year, more normal on the initial lateral view. Stable cardiac size and mediastinal contours. Cardiac size at the upper limits of normal. Visualized tracheal air column is within normal limits. Chronic left thoracic inlet surgical clips. No pneumothorax, pulmonary edema, pleural effusion or confluent pulmonary opacity. No acute osseous abnormality identified. Negative visible bowel gas. Abdominal Calcified aortic atherosclerosis. IMPRESSION: No acute cardiopulmonary abnormality. Abdominal  Aortic Atherosclerosis (ICD10-I70.0). Electronically Signed   By: Genevie Ann M.D.   On: 02/08/2022 11:03     Assessment and Plan:   Shoulder Pain Profound Weakness Presyncope First degree AVB with sinus bradycardia Patient presents with episode of sudden onset weakness, bilateral shoulder/neck pain and presyncope. Trop flat in 50s, BNP not elevated. Cr at baseline. ECG with NSR with profound first degree AVB (although this is chronic). CT head unremarkable. Patient states he has had similar episodes in the past where cardiac monitor revealed SB-NSR with average HR 62 with first degree AVB, no pauses and no Afib. Thought to be some tachy-brady syndrome but given no symptoms at that time, was recommended for continued monitoring. TTE 09/2020 with LVEF 50-55%,  normal RV, severe LAE, moderate aortic dilation. On telemetry here, he is in sinus bradycardia with HR as low as 40s. Has <3s pauses since arrival. Overall, there is concern that his episode was related to bradycardic event in the setting of known significant first degree AVB and bradycardia. May also have some vagal element as patient did report urge to have BM prior to the event (episodes have occurred in the past without that sensation, however). Will monitor on tele overnight and check limited TTE to ensure no interval change in EF.  Likely will need loop in future if cardiac monitor unrevealing. -Monitor on telemetry  -If tele unrevealing, likely would benefit from loop for further evaluation vs 30day monitor and then loop in the future if 30day monitor unrevealing -Check limited TTE to ensure no change in LVEF  CAD -LHC 2021 with 50% mLAD, 80% distal LAD, 85% D2, 70% Lcx. Was recommended for continued medical management -High sensitive troponin flat, not consistent with ACS -Continue medical therapy with aspirin, statin -Not on BB due to bradycardia as detailed above  Hypertension Hyperlipidemia History of recurrent CVA Type 2 diabetes CKD stage III-IV -Per primary team  Risk Assessment/Risk Scores:    For questions or updates, please contact [place] Please consult [email or web address] for contact info under    Signed, Freada Bergeron, MD  02/08/2022 4:01 PM

## 2022-02-09 ENCOUNTER — Observation Stay (HOSPITAL_COMMUNITY): Payer: Medicare Other

## 2022-02-09 DIAGNOSIS — R55 Syncope and collapse: Secondary | ICD-10-CM

## 2022-02-09 DIAGNOSIS — I2510 Atherosclerotic heart disease of native coronary artery without angina pectoris: Secondary | ICD-10-CM | POA: Diagnosis present

## 2022-02-09 DIAGNOSIS — R001 Bradycardia, unspecified: Secondary | ICD-10-CM | POA: Diagnosis not present

## 2022-02-09 DIAGNOSIS — N184 Chronic kidney disease, stage 4 (severe): Secondary | ICD-10-CM | POA: Diagnosis present

## 2022-02-09 DIAGNOSIS — E782 Mixed hyperlipidemia: Secondary | ICD-10-CM | POA: Diagnosis not present

## 2022-02-09 DIAGNOSIS — K219 Gastro-esophageal reflux disease without esophagitis: Secondary | ICD-10-CM | POA: Diagnosis present

## 2022-02-09 DIAGNOSIS — I440 Atrioventricular block, first degree: Secondary | ICD-10-CM

## 2022-02-09 DIAGNOSIS — K5909 Other constipation: Secondary | ICD-10-CM | POA: Diagnosis present

## 2022-02-09 DIAGNOSIS — Z8673 Personal history of transient ischemic attack (TIA), and cerebral infarction without residual deficits: Secondary | ICD-10-CM | POA: Diagnosis not present

## 2022-02-09 DIAGNOSIS — E039 Hypothyroidism, unspecified: Secondary | ICD-10-CM | POA: Diagnosis present

## 2022-02-09 DIAGNOSIS — E1140 Type 2 diabetes mellitus with diabetic neuropathy, unspecified: Secondary | ICD-10-CM | POA: Diagnosis present

## 2022-02-09 DIAGNOSIS — I129 Hypertensive chronic kidney disease with stage 1 through stage 4 chronic kidney disease, or unspecified chronic kidney disease: Secondary | ICD-10-CM | POA: Diagnosis present

## 2022-02-09 DIAGNOSIS — E1165 Type 2 diabetes mellitus with hyperglycemia: Secondary | ICD-10-CM | POA: Diagnosis not present

## 2022-02-09 DIAGNOSIS — Z66 Do not resuscitate: Secondary | ICD-10-CM

## 2022-02-09 DIAGNOSIS — Z7982 Long term (current) use of aspirin: Secondary | ICD-10-CM | POA: Diagnosis not present

## 2022-02-09 DIAGNOSIS — Z7189 Other specified counseling: Secondary | ICD-10-CM

## 2022-02-09 DIAGNOSIS — E1122 Type 2 diabetes mellitus with diabetic chronic kidney disease: Secondary | ICD-10-CM | POA: Diagnosis present

## 2022-02-09 DIAGNOSIS — I10 Essential (primary) hypertension: Secondary | ICD-10-CM | POA: Diagnosis not present

## 2022-02-09 DIAGNOSIS — R531 Weakness: Secondary | ICD-10-CM | POA: Diagnosis present

## 2022-02-09 DIAGNOSIS — Z85528 Personal history of other malignant neoplasm of kidney: Secondary | ICD-10-CM | POA: Diagnosis not present

## 2022-02-09 DIAGNOSIS — H5462 Unqualified visual loss, left eye, normal vision right eye: Secondary | ICD-10-CM | POA: Diagnosis present

## 2022-02-09 DIAGNOSIS — I455 Other specified heart block: Secondary | ICD-10-CM

## 2022-02-09 DIAGNOSIS — I495 Sick sinus syndrome: Secondary | ICD-10-CM | POA: Diagnosis present

## 2022-02-09 DIAGNOSIS — E785 Hyperlipidemia, unspecified: Secondary | ICD-10-CM | POA: Diagnosis present

## 2022-02-09 DIAGNOSIS — Z87891 Personal history of nicotine dependence: Secondary | ICD-10-CM | POA: Diagnosis not present

## 2022-02-09 DIAGNOSIS — G4733 Obstructive sleep apnea (adult) (pediatric): Secondary | ICD-10-CM | POA: Diagnosis present

## 2022-02-09 DIAGNOSIS — R42 Dizziness and giddiness: Secondary | ICD-10-CM | POA: Diagnosis not present

## 2022-02-09 DIAGNOSIS — M1990 Unspecified osteoarthritis, unspecified site: Secondary | ICD-10-CM | POA: Diagnosis present

## 2022-02-09 DIAGNOSIS — Z794 Long term (current) use of insulin: Secondary | ICD-10-CM | POA: Diagnosis not present

## 2022-02-09 DIAGNOSIS — H401130 Primary open-angle glaucoma, bilateral, stage unspecified: Secondary | ICD-10-CM | POA: Diagnosis present

## 2022-02-09 DIAGNOSIS — N400 Enlarged prostate without lower urinary tract symptoms: Secondary | ICD-10-CM | POA: Diagnosis present

## 2022-02-09 LAB — CBC
HCT: 35.2 % — ABNORMAL LOW (ref 39.0–52.0)
Hemoglobin: 12.2 g/dL — ABNORMAL LOW (ref 13.0–17.0)
MCH: 29.1 pg (ref 26.0–34.0)
MCHC: 34.7 g/dL (ref 30.0–36.0)
MCV: 84 fL (ref 80.0–100.0)
Platelets: 160 10*3/uL (ref 150–400)
RBC: 4.19 MIL/uL — ABNORMAL LOW (ref 4.22–5.81)
RDW: 13.2 % (ref 11.5–15.5)
WBC: 6.8 10*3/uL (ref 4.0–10.5)
nRBC: 0 % (ref 0.0–0.2)

## 2022-02-09 LAB — BASIC METABOLIC PANEL
Anion gap: 7 (ref 5–15)
BUN: 36 mg/dL — ABNORMAL HIGH (ref 8–23)
CO2: 22 mmol/L (ref 22–32)
Calcium: 9.5 mg/dL (ref 8.9–10.3)
Chloride: 109 mmol/L (ref 98–111)
Creatinine, Ser: 2.25 mg/dL — ABNORMAL HIGH (ref 0.61–1.24)
GFR, Estimated: 26 mL/min — ABNORMAL LOW (ref >60)
Glucose, Bld: 106 mg/dL — ABNORMAL HIGH (ref 70–99)
Potassium: 4.3 mmol/L (ref 3.5–5.1)
Sodium: 138 mmol/L (ref 135–145)

## 2022-02-09 LAB — GLUCOSE, CAPILLARY
Glucose-Capillary: 144 mg/dL — ABNORMAL HIGH (ref 70–99)
Glucose-Capillary: 169 mg/dL — ABNORMAL HIGH (ref 70–99)

## 2022-02-09 LAB — ECHOCARDIOGRAM LIMITED
Area-P 1/2: 2.45 cm2
Height: 71 in
Single Plane A4C EF: 59.1 %
Weight: 3648 oz

## 2022-02-09 LAB — CBG MONITORING, ED
Glucose-Capillary: 102 mg/dL — ABNORMAL HIGH (ref 70–99)
Glucose-Capillary: 120 mg/dL — ABNORMAL HIGH (ref 70–99)

## 2022-02-09 NOTE — Progress Notes (Signed)
Daily Progress Note   Patient Name: Martin Richardson       Date: 02/09/2022 DOB: 31-Aug-1925  Age: 89 y.o. MRN#: [id number] Attending Physician: Little Ishikawa, MD Primary Care Physician: Kristen Loader, FNP Admit Date: 02/08/2022  Reason for Consultation/Follow-up: Establishing goals of care  Subjective: Medical records reviewed. Patient assessed at the bedside. Discussed with RN. No family present during my visit.  Called patient's daughter prior to visiting patient. She shares that patient had a very difficult night and was very much hopeful for discharge today.  Provided update that patient is not considered stable for discharge today.  Provided update on today's echo results, with EF of 60 to 65%.  She is glad to hear this and not too surprised.  She feels that his syncopal episodes are more related to his constipation.  We discussed that cardiology has already visited today and will likely plan to set up for 30-day outpatient monitor.  She is going to be at the bedside in about an hour.  I offered to assist with coordinating another family meeting if they thought this was helpful, adding that we may need to just see how things play out.  She agrees with this, though will discuss with her sisters and see how they feel as well.  Patient has just eaten lunch when I arrived to assess.  He confirms that he had a very difficult time overnight and could hardly sleep.  Provided him with update that he is going to be moved to an inpatient bed soon.  He is disappointed to hear that he is not going to be discharged today, asking if this is related to his echo results.  I shared that it is likely not, as his results were normal, but rather this is more due to continuing cautious monitoring of his low  heart rate.  He states that if he were not being listened, he would not stay.  He thinks maybe getting dressed would help lift his spirits.  He is appreciative of the support and has no other concerns.  Questions and concerns addressed. PMT will continue to support holistically.   Length of Stay: 0  Physical Exam Vitals and nursing note reviewed.  Cardiovascular:     Rate and Rhythm: Bradycardia present.  Pulmonary:     Effort: Pulmonary  effort is normal.  Skin:    General: Skin is warm and dry.  Neurological:     Mental Status: He is alert and oriented to person, place, and time.  Psychiatric:        Behavior: Behavior is cooperative.            Vital Signs: BP (!) 142/65   Pulse (!) 56   Temp 97.6 F (36.4 C) (Oral)   Resp 16   Ht '5\' 11"'$  (1.803 m)   Wt 103.4 kg   SpO2 94%   BMI 31.80 kg/m  SpO2: SpO2: 94 % O2 Device: O2 Device: Room Air O2 Flow Rate:        Palliative Assessment/Data: 50-60%   Palliative Care Assessment & Plan   Patient Profile: 89 y.o. male  with past medical history of DM, HTN, glaucoma, OSA, tachy-brady syndrome without pacer, and prior recurrent CVA, HLD, CKD3-4, anemia, admitted on 02/08/2022 with syncope and dyscoordination.    Patient admitted for monitoring given concern for cardiogenic syncope, with plans for echo. PMT has been consulted to assist with goals of care conversation.  Assessment: Goals of care conversation Tachy-brady syndrome with acute symptomatic bradycardia CKD 4, progressing Poorly controlled DM2 History of CVA  Recommendations/Plan: Continue DNR Continue current care and avoid aggressive interventions Goals of care are clear to return home and live independently as long as he can, he would rather avoid moving in or staying with any of his daughters for the time being Psychosocial and emotional support provided PMT will continue to follow   Prognosis:  Unable to determine  Discharge Planning: With outpatient  PT/OT; likely not a candidate for outpatient palliative care with [facility] (preferred agency) yet.  Care plan was discussed with patient, patient's daughter, RN   MDM high         [place], PA-C  Palliative Medicine Team Team phone # [phone]  Thank you for allowing the Palliative Medicine Team to assist in the care of this patient. Please utilize secure chat with additional questions, if there is no response within 30 minutes please call the above phone number.  Palliative Medicine Team providers are available by phone from 7am to 7pm daily and can be reached through the team cell phone.  Should this patient require assistance outside of these hours, please call the patient's attending physician.

## 2022-02-09 NOTE — Plan of Care (Signed)
Pt ambulating to [facility] with one person to assist.   Problem: Education: Goal: Ability to describe self-care measures that may prevent or decrease complications (Diabetes Survival Skills Education) will improve Outcome: Progressing Goal: Individualized Educational Video(s) Outcome: Progressing   Problem: Coping: Goal: Ability to adjust to condition or change in health will improve Outcome: Progressing   Problem: Fluid Volume: Goal: Ability to maintain a balanced intake and output will improve Outcome: Progressing   Problem: Health Behavior/Discharge Planning: Goal: Ability to identify and utilize available resources and services will improve Outcome: Progressing Goal: Ability to manage health-related needs will improve Outcome: Progressing   Problem: Metabolic: Goal: Ability to maintain appropriate glucose levels will improve Outcome: Progressing   Problem: Nutritional: Goal: Maintenance of adequate nutrition will improve Outcome: Progressing Goal: Progress toward achieving an optimal weight will improve Outcome: Progressing   Problem: Skin Integrity: Goal: Risk for impaired skin integrity will decrease Outcome: Progressing   Problem: Tissue Perfusion: Goal: Adequacy of tissue perfusion will improve Outcome: Progressing   Problem: Education: Goal: Knowledge of condition and prescribed therapy will improve Outcome: Progressing   Problem: Cardiac: Goal: Will achieve and/or maintain adequate cardiac output Outcome: Progressing   Problem: Physical Regulation: Goal: Complications related to the disease process, condition or treatment will be avoided or minimized Outcome: Progressing   Problem: Education: Goal: Knowledge of General Education information will improve Description: Including pain rating scale, medication(s)/side effects and non-pharmacologic comfort measures Outcome: Progressing   Problem: Health Behavior/Discharge Planning: Goal: Ability to manage  health-related needs will improve Outcome: Progressing   Problem: Clinical Measurements: Goal: Ability to maintain clinical measurements within normal limits will improve Outcome: Progressing Goal: Will remain free from infection Outcome: Progressing Goal: Diagnostic test results will improve Outcome: Progressing Goal: Respiratory complications will improve Outcome: Progressing Goal: Cardiovascular complication will be avoided Outcome: Progressing   Problem: Activity: Goal: Risk for activity intolerance will decrease Outcome: Progressing   Problem: Nutrition: Goal: Adequate nutrition will be maintained Outcome: Progressing   Problem: Coping: Goal: Level of anxiety will decrease Outcome: Progressing   Problem: Elimination: Goal: Will not experience complications related to bowel motility Outcome: Progressing Goal: Will not experience complications related to urinary retention Outcome: Progressing   Problem: Pain Managment: Goal: General experience of comfort will improve Outcome: Progressing   Problem: Safety: Goal: Ability to remain free from injury will improve Outcome: Progressing   Problem: Skin Integrity: Goal: Risk for impaired skin integrity will decrease Outcome: Progressing

## 2022-02-09 NOTE — ED Notes (Signed)
Pt transported to echo lab.

## 2022-02-09 NOTE — ED Notes (Signed)
ED TO INPATIENT HANDOFF REPORT  ED Nurse Name and Phone #: Delana Meyer Horacio Werth [phone]  S Name/Age/Gender Martin Richardson 89 y.o. male Room/Bed: 039C/039C  Code Status   Code Status: DNR  Home/SNF/Other Home Patient oriented to: self, place, time and situation Is this baseline? Yes   Triage Complete: Triage complete  Chief Complaint Syncope, cardiogenic [R55]  Triage Note Pt BIB [facility] from home c/o sudden onset of weakness and issues with coordination that he was trying to get his life alert out of his shirt but felt like he could not work his hands. Pt LKW was 530 and was fine that is when he woke up. Symptoms started at 830 this morning. When EMS arrived about 30 mins later pts symptoms had resolved.    Allergies Allergies  Allergen Reactions   Other Other (See Comments)    Other reaction(s): rash   Tape Rash    Other reaction(s): rash    Level of Care/Admitting Diagnosis ED Disposition     ED Disposition  Admit   Condition  --   Comment  Hospital Area: [facility] [[id number]]  Level of Care: Telemetry Cardiac [[id number]]  May place patient in observation at [facility] or [facility] if equivalent level of care is available:: No  Covid Evaluation: Asymptomatic - no recent exposure (last 10 days) testing not required  Diagnosis: Syncope, cardiogenic [[id number]]  Admitting Physician: Karmen Bongo [[id number]]  Attending Physician: Karmen Bongo [[id number]]          B Medical/Surgery History Past Medical History:  Diagnosis Date   Arthritis    Blind left eye    Cancer of left kidney ([place])    Cataract    Chronic kidney disease    Constipation    Diabetes mellitus without complication ([place])    Glaucoma    Hypertension    Neuropathy    Stroke [facility])    Past Surgical History:  Procedure Laterality Date   CHOLECYSTECTOMY     LEFT HEART CATH AND CORONARY ANGIOGRAPHY N/A 12/16/2019   Procedure: LEFT HEART CATH AND CORONARY ANGIOGRAPHY;  Surgeon:  Martinique, Peter M, MD;  Location: [facility];  Service: Cardiovascular;  Laterality: N/A;   ORIF ANKLE FRACTURE Right 08/13/2019   Procedure: OPEN REDUCTION INTERNAL FIXATION (ORIF) ANKLE FRACTURE;  Surgeon: Renette Butters, MD;  Location: [facility];  Service: Orthopedics;  Laterality: Right;   THYROID SURGERY     TONSILLECTOMY       A IV Location/Drains/Wounds Patient Lines/Drains/Airways Status     Active Line/Drains/Airways     Name Placement date Placement time Site Days   Peripheral IV 02/08/22 18 G Left;Posterior Hand 02/08/22  1019  Hand  1            Intake/Output Last 24 hours  Intake/Output Summary (Last 24 hours) at 02/09/2022 1226 Last data filed at 02/09/2022 0551 Gross per 24 hour  Intake 573.45 ml  Output --  Net 573.45 ml    Labs/Imaging Results for orders placed or performed during the hospital encounter of 02/08/22 (from the past 48 hour(s))  Comprehensive metabolic panel     Status: Abnormal   Collection Time: 02/08/22 10:27 AM  Result Value Ref Range   Sodium 137 135 - 145 mmol/L   Potassium 4.5 3.5 - 5.1 mmol/L   Chloride 107 98 - 111 mmol/L   CO2 22 22 - 32 mmol/L   Glucose, Bld 181 (H) 70 - 99 mg/dL    Comment: Glucose  reference range applies only to samples taken after fasting for at least 8 hours.   BUN 38 (H) 8 - 23 mg/dL   Creatinine, Ser 2.40 (H) 0.61 - 1.24 mg/dL   Calcium 9.5 8.9 - 10.3 mg/dL   Total Protein 5.9 (L) 6.5 - 8.1 g/dL   Albumin 3.3 (L) 3.5 - 5.0 g/dL   AST 18 15 - 41 U/L   ALT 16 0 - 44 U/L   Alkaline Phosphatase 65 38 - 126 U/L   Total Bilirubin 0.8 0.3 - 1.2 mg/dL   GFR, Estimated 24 (L) >60 mL/min    Comment: (NOTE) Calculated using the CKD-EPI Creatinine Equation (2021)    Anion gap 8 5 - 15    Comment: Performed at [facility], [place] [address] [place], [place] [address]  Troponin I (High Sensitivity)     Status: Abnormal   Collection Time: 02/08/22 10:27 AM  Result Value Ref Range   Troponin I (High  Sensitivity) 57 (H) <18 ng/L    Comment: (NOTE) Elevated high sensitivity troponin I (hsTnI) values and significant  changes across serial measurements may suggest ACS but many other  chronic and acute conditions are known to elevate hsTnI results.  Refer to the "Links" section for chest pain algorithms and additional  guidance. Performed at [facility], [place] [address], [place], [place] [address]   CBC with Differential     Status: Abnormal   Collection Time: 02/08/22 10:27 AM  Result Value Ref Range   WBC 6.5 4.0 - 10.5 K/uL   RBC 4.20 (L) 4.22 - 5.81 MIL/uL   Hemoglobin 12.4 (L) 13.0 - 17.0 g/dL   HCT 35.8 (L) 39.0 - 52.0 %   MCV 85.2 80.0 - 100.0 fL   MCH 29.5 26.0 - 34.0 pg   MCHC 34.6 30.0 - 36.0 g/dL   RDW 13.2 11.5 - 15.5 %   Platelets 154 150 - 400 K/uL   nRBC 0.0 0.0 - 0.2 %   Neutrophils Relative % 59 %   Neutro Abs 3.9 1.7 - 7.7 K/uL   Lymphocytes Relative 16 %   Lymphs Abs 1.1 0.7 - 4.0 K/uL   Monocytes Relative 9 %   Monocytes Absolute 0.6 0.1 - 1.0 K/uL   Eosinophils Relative 13 %   Eosinophils Absolute 0.9 (H) 0.0 - 0.5 K/uL   Basophils Relative 2 %   Basophils Absolute 0.1 0.0 - 0.1 K/uL   Immature Granulocytes 1 %   Abs Immature Granulocytes 0.05 0.00 - 0.07 K/uL    Comment: Performed at [facility] [address] [place], [place] [address]  Brain natriuretic peptide     Status: None   Collection Time: 02/08/22 10:34 AM  Result Value Ref Range   B Natriuretic Peptide 87.8 0.0 - 100.0 pg/mL    Comment: Performed at [facility] [address], [place], [place] [address]  Troponin I (High Sensitivity)     Status: Abnormal   Collection Time: 02/08/22 12:46 PM  Result Value Ref Range   Troponin I (High Sensitivity) 58 (H) <18 ng/L    Comment: (NOTE) Elevated high sensitivity troponin I (hsTnI) values and significant  changes across serial measurements may suggest ACS but many other  chronic and acute conditions are known to elevate  hsTnI results.  Refer to the "Links" section for chest pain algorithms and additional  guidance. Performed at [facility], [place] [address] [place], [place] [address]  CBG monitoring, ED     Status: Abnormal   Collection Time: 02/08/22 12:51 PM  Result Value Ref Range   Glucose-Capillary 183 (H) 70 - 99 mg/dL    Comment: Glucose reference range applies only to samples taken after fasting for at least 8 hours.  CBG monitoring, ED     Status: Abnormal   Collection Time: 02/08/22  5:45 PM  Result Value Ref Range   Glucose-Capillary 152 (H) 70 - 99 mg/dL    Comment: Glucose reference range applies only to samples taken after fasting for at least 8 hours.  CBG monitoring, ED     Status: Abnormal   Collection Time: 02/08/22  9:38 PM  Result Value Ref Range   Glucose-Capillary 196 (H) 70 - 99 mg/dL    Comment: Glucose reference range applies only to samples taken after fasting for at least 8 hours.  Basic metabolic panel     Status: Abnormal   Collection Time: 02/09/22  2:40 AM  Result Value Ref Range   Sodium 138 135 - 145 mmol/L   Potassium 4.3 3.5 - 5.1 mmol/L   Chloride 109 98 - 111 mmol/L   CO2 22 22 - 32 mmol/L   Glucose, Bld 106 (H) 70 - 99 mg/dL    Comment: Glucose reference range applies only to samples taken after fasting for at least 8 hours.   BUN 36 (H) 8 - 23 mg/dL   Creatinine, Ser 2.25 (H) 0.61 - 1.24 mg/dL   Calcium 9.5 8.9 - 10.3 mg/dL   GFR, Estimated 26 (L) >60 mL/min    Comment: (NOTE) Calculated using the CKD-EPI Creatinine Equation (2021)    Anion gap 7 5 - 15    Comment: Performed at [facility] [address], [place], [place] [address]  CBC     Status: Abnormal   Collection Time: 02/09/22  2:40 AM  Result Value Ref Range   WBC 6.8 4.0 - 10.5 K/uL   RBC 4.19 (L) 4.22 - 5.81 MIL/uL   Hemoglobin 12.2 (L) 13.0 - 17.0 g/dL   HCT 35.2 (L) 39.0 - 52.0 %   MCV 84.0 80.0 - 100.0 fL   MCH 29.1 26.0 - 34.0 pg   MCHC 34.7 30.0 - 36.0 g/dL   RDW  13.2 11.5 - 15.5 %   Platelets 160 150 - 400 K/uL   nRBC 0.0 0.0 - 0.2 %    Comment: Performed at [facility], [place] [address] [place], [place] [address]  CBG monitoring, ED     Status: Abnormal   Collection Time: 02/09/22  8:33 AM  Result Value Ref Range   Glucose-Capillary 102 (H) 70 - 99 mg/dL    Comment: Glucose reference range applies only to samples taken after fasting for at least 8 hours.  CBG monitoring, ED     Status: Abnormal   Collection Time: 02/09/22 12:01 PM  Result Value Ref Range   Glucose-Capillary 120 (H) 70 - 99 mg/dL    Comment: Glucose reference range applies only to samples taken after fasting for at least 8 hours.   ECHOCARDIOGRAM LIMITED  Result Date: 02/09/2022    ECHOCARDIOGRAM LIMITED REPORT   Patient Name:   Martin Richardson Date of Exam: 02/09/2022 Medical Rec #:  [id number]         Height:       71.0 in Accession #:    [id number]        Weight:  228.0 lb Date of Birth:  08/18/1925         BSA:          2.229 m Patient Age:    7 years          BP:           142/65 mmHg Patient Gender: M                 HR:           51 bpm. Exam Location:  Inpatient Procedure: Limited Echo, Cardiac Doppler and Limited Color Doppler Indications:    CVA;  History:        Patient has prior history of Echocardiogram examinations, most                 recent 10/07/2020. Stroke, Signs/Symptoms:Syncope and Chest Pain;                 Risk Factors:Diabetes, Hypertension, Dyslipidemia and Former                 Smoker.  Sonographer:    Greer Pickerel Referring Phys: [place]  1. Limited study.  2. Left ventricular ejection fraction, by estimation, is 60 to 65%. The left ventricle has normal function. The left ventricle has no regional wall motion abnormalities. There is mild left ventricular hypertrophy. Left ventricular diastolic function could not be evaluated.  3. Right ventricular systolic function is normal. The right ventricular size is normal.  4. The  mitral valve is normal in structure. No evidence of mitral valve regurgitation. No evidence of mitral stenosis.  5. The aortic valve is tricuspid. Aortic valve regurgitation is trivial. Aortic valve sclerosis is present, with no evidence of aortic valve stenosis.  6. Aortic dilatation noted. There is mild dilatation of the aortic root, measuring 45 mm. FINDINGS  Left Ventricle: Left ventricular ejection fraction, by estimation, is 60 to 65%. The left ventricle has normal function. The left ventricle has no regional wall motion abnormalities. The left ventricular internal cavity size was normal in size. There is  mild left ventricular hypertrophy. Left ventricular diastolic function could not be evaluated. Right Ventricle: The right ventricular size is normal. Right ventricular systolic function is normal. Left Atrium: Left atrial size was normal in size. Right Atrium: Right atrial size was normal in size. Pericardium: There is no evidence of pericardial effusion. Mitral Valve: The mitral valve is normal in structure. No evidence of mitral valve stenosis. Tricuspid Valve: The tricuspid valve is normal in structure. Tricuspid valve regurgitation is not demonstrated. No evidence of tricuspid stenosis. Aortic Valve: The aortic valve is tricuspid. Aortic valve regurgitation is trivial. Aortic valve sclerosis is present, with no evidence of aortic valve stenosis. Pulmonic Valve: The pulmonic valve was normal in structure. Aorta: Aortic dilatation noted. There is mild dilatation of the aortic root, measuring 45 mm. Venous: The inferior vena cava was not well visualized. IAS/Shunts: The interatrial septum was not well visualized. Additional Comments: Limited study.  LEFT VENTRICLE PLAX 2D LVOT diam:     1.90 cm LV SV:         74 LV SV Index:   33 LVOT Area:     2.84 cm  LV Volumes (MOD) LV vol d, MOD A4C: 162.0 ml LV vol s, MOD A4C: 66.2 ml LV SV MOD A4C:     162.0 ml AORTIC VALVE LVOT Vmax:   104.00 cm/s LVOT Vmean:   69.800 cm/s LVOT VTI:  0.260 m MITRAL VALVE MV Area (PHT): 2.45 cm    SHUNTS MV Decel Time: 310 msec    Systemic VTI:  0.26 m MV E velocity: 53.80 cm/s  Systemic Diam: 1.90 cm MV A velocity: 66.90 cm/s MV E/A ratio:  0.80 Kirk Ruths MD Electronically signed by Kirk Ruths MD Signature Date/Time: 02/09/2022/10:52:40 AM    Final    CT Head Wo Contrast  Result Date: 02/08/2022 CLINICAL DATA:  Transient ischemic attack (TIA) EXAM: CT HEAD WITHOUT CONTRAST TECHNIQUE: Contiguous axial images were obtained from the base of the skull through the vertex without intravenous contrast. RADIATION DOSE REDUCTION: This exam was performed according to the departmental dose-optimization program which includes automated exposure control, adjustment of the mA and/or kV according to patient size and/or use of iterative reconstruction technique. COMPARISON:  10/07/2020 FINDINGS: Brain: No evidence of acute infarction, hemorrhage, hydrocephalus, extra-axial collection or mass lesion/mass effect. Patchy low-density changes within the periventricular and subcortical white matter compatible with chronic microvascular ischemic change. Mild-moderate diffuse cerebral volume loss. Vascular: Atherosclerotic calcifications involving the large vessels of the skull base. No unexpected hyperdense vessel. Skull: Normal. Negative for fracture or focal lesion. Sinuses/Orbits: No acute finding. Other: None. IMPRESSION: 1. No acute intracranial findings. 2. Chronic microvascular ischemic change and cerebral volume loss. Electronically Signed   By: Davina Poke D.O.   On: 02/08/2022 13:38   DG Chest 2 View  Result Date: 02/08/2022 CLINICAL DATA:  89 year old male with sudden onset weakness. Chest pain. EXAM: CHEST - 2 VIEW COMPARISON:  Portable chest 09/26/2021 and earlier. FINDINGS: Semi upright AP and lateral views at 1049 hours. Stable lung volumes compared to last year, more normal on the initial lateral view. Stable cardiac size  and mediastinal contours. Cardiac size at the upper limits of normal. Visualized tracheal air column is within normal limits. Chronic left thoracic inlet surgical clips. No pneumothorax, pulmonary edema, pleural effusion or confluent pulmonary opacity. No acute osseous abnormality identified. Negative visible bowel gas. Abdominal Calcified aortic atherosclerosis. IMPRESSION: No acute cardiopulmonary abnormality. Abdominal  Aortic Atherosclerosis (ICD10-I70.0). Electronically Signed   By: Genevie Ann M.D.   On: 02/08/2022 11:03    Pending Labs Unresulted Labs (From admission, onward)    None       Vitals/Pain Today's Vitals   02/09/22 0600 02/09/22 0800 02/09/22 0937 02/09/22 1205  BP: 113/82 (!) 142/65  (!) 147/80  Pulse: (!) 50 (!) 56  (!) 49  Resp: '13 16  12  '$ Temp:   97.6 F (36.4 C)   TempSrc:   Oral   SpO2: 96% 94%  100%  Weight:      Height:      PainSc:        Isolation Precautions No active isolations  Medications Medications  aspirin EC tablet 81 mg (81 mg Oral Given 02/09/22 1003)  atorvastatin (LIPITOR) tablet 80 mg (80 mg Oral Given 02/09/22 1003)  insulin glargine-yfgn (SEMGLEE) injection 20 Units (0 Units Subcutaneous Hold 02/09/22 1012)  levothyroxine (SYNTHROID) tablet 125 mcg (125 mcg Oral Given 02/09/22 1003)  docusate sodium (COLACE) capsule 100 mg (100 mg Oral Given 02/09/22 1003)  famotidine (PEPCID) tablet 20 mg (20 mg Oral Given 02/08/22 2152)  pantoprazole (PROTONIX) EC tablet 40 mg (40 mg Oral Given 02/09/22 1003)  tamsulosin (FLOMAX) capsule 0.8 mg (0.8 mg Oral Given 02/08/22 1749)  gabapentin (NEURONTIN) capsule 200 mg (200 mg Oral Given 02/08/22 2152)  sodium chloride flush (NS) 0.9 % injection 3 mL (3 mLs Intravenous Given 02/09/22  1011)  acetaminophen (TYLENOL) tablet 650 mg (has no administration in time range)    Or  acetaminophen (TYLENOL) suppository 650 mg (has no administration in time range)  insulin aspart (novoLOG) injection 0-9 Units ( Subcutaneous  Not Given 02/09/22 1205)  enoxaparin (LOVENOX) injection 30 mg (30 mg Subcutaneous Given 02/08/22 1748)  ondansetron (ZOFRAN) tablet 4 mg (has no administration in time range)    Or  ondansetron (ZOFRAN) injection 4 mg (has no administration in time range)  senna (SENOKOT) tablet 8.6 mg (8.6 mg Oral Given 02/09/22 1003)  polyethylene glycol (MIRALAX / GLYCOLAX) packet 17 g (17 g Oral Given 02/09/22 1004)  bisacodyl (DULCOLAX) EC tablet 5 mg (has no administration in time range)  sodium phosphate (FLEET) 7-19 GM/118ML enema 1 enema (has no administration in time range)  acidophilus (RISAQUAD) capsule 1 capsule (has no administration in time range)  timolol (TIMOPTIC) 0.5 % ophthalmic solution 1 drop (1 drop Both Eyes Not Given 02/09/22 1011)    Mobility Walks with cane, stand-by assist Low fall risk   Focused Assessments Cardiac Assessment Handoff:  Cardiac Rhythm: Sinus bradycardia No results found for: "CKTOTAL", "CKMB", "CKMBINDEX", "TROPONINI" No results found for: "DDIMER" Does the Patient currently have chest pain? No    R Recommendations: See Admitting Provider Note  Report given to:   Additional Notes:

## 2022-02-09 NOTE — Progress Notes (Signed)
DAILY PROGRESS NOTE   Patient Name: Martin Richardson Date of Encounter: 02/09/2022 Cardiologist: Sinclair Grooms, MD  Chief Complaint   Dizzy  Patient Profile   Martin Richardson is a 89 y.o. male with a hx of recurrent ischemic CVA, hypertension, hyperlipidemia, type 2 diabetes, sinus bradycardia, hypothyroidism, CKD stage III-IV, anemia, first-degree AV block who is being seen 02/08/2022 for the evaluation of syncope at the request of Dr. Lorin Mercy   Subjective   No acute events noted overnight.  His monitor showed some intermittent bradycardia with first-degree AV block and short pauses less than 3 seconds.  He ambulated today with physical therapy and noted some increase in heart rate but only up into the 60s.  Blood pressure was elevated.  An echocardiogram is pending.  Objective   Vitals:   02/09/22 0508 02/09/22 0600 02/09/22 0800 02/09/22 0937  BP:  113/82 (!) 142/65   Pulse:  (!) 50 (!) 56   Resp:  13 16   Temp: 97.9 F (36.6 C)   97.6 F (36.4 C)  TempSrc: Oral   Oral  SpO2:  96% 94%   Weight:      Height:        Intake/Output Summary (Last 24 hours) at 02/09/2022 1037 Last data filed at 02/09/2022 0551 Gross per 24 hour  Intake 573.45 ml  Output --  Net 573.45 ml   Filed Weights   02/08/22 1022  Weight: 103.4 kg    Physical Exam   General appearance: alert and no distress Neck: no carotid bruit, no JVD, and thyroid not enlarged, symmetric, no tenderness/mass/nodules Lungs: clear to auscultation bilaterally Heart: regular rate and rhythm Abdomen: soft, non-tender; bowel sounds normal; no masses,  no organomegaly Extremities: extremities normal, atraumatic, no cyanosis or edema Pulses: 2+ and symmetric Skin: Skin color, texture, turgor normal. No rashes or lesions Neurologic: Grossly normal Psych: Pleasant  Inpatient Medications    Scheduled Meds:  aspirin EC  81 mg Oral Daily   atorvastatin  80 mg Oral Daily   docusate sodium  100 mg Oral BID    enoxaparin (LOVENOX) injection  30 mg Subcutaneous Q24H   famotidine  20 mg Oral QHS   gabapentin  200 mg Oral QHS   insulin aspart  0-9 Units Subcutaneous TID WC   insulin glargine-yfgn  20 Units Subcutaneous BID   levothyroxine  125 mcg Oral QAC breakfast   pantoprazole  40 mg Oral Daily   polyethylene glycol  17 g Oral BID   senna  1 tablet Oral BID   sodium chloride flush  3 mL Intravenous Q12H   tamsulosin  0.8 mg Oral QPC supper   timolol  1 drop Both Eyes BID    Continuous Infusions:   PRN Meds: acetaminophen **OR** acetaminophen, acidophilus, bisacodyl, ondansetron **OR** ondansetron (ZOFRAN) IV, sodium phosphate   Labs   Results for orders placed or performed during the hospital encounter of 02/08/22 (from the past 48 hour(s))  Comprehensive metabolic panel     Status: Abnormal   Collection Time: 02/08/22 10:27 AM  Result Value Ref Range   Sodium 137 135 - 145 mmol/L   Potassium 4.5 3.5 - 5.1 mmol/L   Chloride 107 98 - 111 mmol/L   CO2 22 22 - 32 mmol/L   Glucose, Bld 181 (H) 70 - 99 mg/dL    Comment: Glucose reference range applies only to samples taken after fasting for at least 8 hours.   BUN 38 (H) 8 -  23 mg/dL   Creatinine, Ser 2.40 (H) 0.61 - 1.24 mg/dL   Calcium 9.5 8.9 - 10.3 mg/dL   Total Protein 5.9 (L) 6.5 - 8.1 g/dL   Albumin 3.3 (L) 3.5 - 5.0 g/dL   AST 18 15 - 41 U/L   ALT 16 0 - 44 U/L   Alkaline Phosphatase 65 38 - 126 U/L   Total Bilirubin 0.8 0.3 - 1.2 mg/dL   GFR, Estimated 24 (L) >60 mL/min    Comment: (NOTE) Calculated using the CKD-EPI Creatinine Equation (2021)    Anion gap 8 5 - 15    Comment: Performed at [facility] [address], [place], [place] [address]  Troponin I (High Sensitivity)     Status: Abnormal   Collection Time: 02/08/22 10:27 AM  Result Value Ref Range   Troponin I (High Sensitivity) 57 (H) <18 ng/L    Comment: (NOTE) Elevated high sensitivity troponin I (hsTnI) values and significant  changes across  serial measurements may suggest ACS but many other  chronic and acute conditions are known to elevate hsTnI results.  Refer to the "Links" section for chest pain algorithms and additional  guidance. Performed at [facility], [place] [address], [place], [place] [address]   CBC with Differential     Status: Abnormal   Collection Time: 02/08/22 10:27 AM  Result Value Ref Range   WBC 6.5 4.0 - 10.5 K/uL   RBC 4.20 (L) 4.22 - 5.81 MIL/uL   Hemoglobin 12.4 (L) 13.0 - 17.0 g/dL   HCT 35.8 (L) 39.0 - 52.0 %   MCV 85.2 80.0 - 100.0 fL   MCH 29.5 26.0 - 34.0 pg   MCHC 34.6 30.0 - 36.0 g/dL   RDW 13.2 11.5 - 15.5 %   Platelets 154 150 - 400 K/uL   nRBC 0.0 0.0 - 0.2 %   Neutrophils Relative % 59 %   Neutro Abs 3.9 1.7 - 7.7 K/uL   Lymphocytes Relative 16 %   Lymphs Abs 1.1 0.7 - 4.0 K/uL   Monocytes Relative 9 %   Monocytes Absolute 0.6 0.1 - 1.0 K/uL   Eosinophils Relative 13 %   Eosinophils Absolute 0.9 (H) 0.0 - 0.5 K/uL   Basophils Relative 2 %   Basophils Absolute 0.1 0.0 - 0.1 K/uL   Immature Granulocytes 1 %   Abs Immature Granulocytes 0.05 0.00 - 0.07 K/uL    Comment: Performed at [facility] [address] [place], [place] [address]  Brain natriuretic peptide     Status: None   Collection Time: 02/08/22 10:34 AM  Result Value Ref Range   B Natriuretic Peptide 87.8 0.0 - 100.0 pg/mL    Comment: Performed at [facility] [address], [place], [place] [address]  Troponin I (High Sensitivity)     Status: Abnormal   Collection Time: 02/08/22 12:46 PM  Result Value Ref Range   Troponin I (High Sensitivity) 58 (H) <18 ng/L    Comment: (NOTE) Elevated high sensitivity troponin I (hsTnI) values and significant  changes across serial measurements may suggest ACS but many other  chronic and acute conditions are known to elevate hsTnI results.  Refer to the "Links" section for chest pain algorithms and additional  guidance. Performed at [facility], [place] [address], [place], [place] [address]   CBG monitoring, ED     Status: Abnormal   Collection Time: 02/08/22 12:51 PM  Result Value Ref  Range   Glucose-Capillary 183 (H) 70 - 99 mg/dL    Comment: Glucose reference range applies only to samples taken after fasting for at least 8 hours.  CBG monitoring, ED     Status: Abnormal   Collection Time: 02/08/22  5:45 PM  Result Value Ref Range   Glucose-Capillary 152 (H) 70 - 99 mg/dL    Comment: Glucose reference range applies only to samples taken after fasting for at least 8 hours.  CBG monitoring, ED     Status: Abnormal   Collection Time: 02/08/22  9:38 PM  Result Value Ref Range   Glucose-Capillary 196 (H) 70 - 99 mg/dL    Comment: Glucose reference range applies only to samples taken after fasting for at least 8 hours.  Basic metabolic panel     Status: Abnormal   Collection Time: 02/09/22  2:40 AM  Result Value Ref Range   Sodium 138 135 - 145 mmol/L   Potassium 4.3 3.5 - 5.1 mmol/L   Chloride 109 98 - 111 mmol/L   CO2 22 22 - 32 mmol/L   Glucose, Bld 106 (H) 70 - 99 mg/dL    Comment: Glucose reference range applies only to samples taken after fasting for at least 8 hours.   BUN 36 (H) 8 - 23 mg/dL   Creatinine, Ser 2.25 (H) 0.61 - 1.24 mg/dL   Calcium 9.5 8.9 - 10.3 mg/dL   GFR, Estimated 26 (L) >60 mL/min    Comment: (NOTE) Calculated using the CKD-EPI Creatinine Equation (2021)    Anion gap 7 5 - 15    Comment: Performed at [facility] [address], [place], [place] [address]  CBC     Status: Abnormal   Collection Time: 02/09/22  2:40 AM  Result Value Ref Range   WBC 6.8 4.0 - 10.5 K/uL   RBC 4.19 (L) 4.22 - 5.81 MIL/uL   Hemoglobin 12.2 (L) 13.0 - 17.0 g/dL   HCT 35.2 (L) 39.0 - 52.0 %   MCV 84.0 80.0 - 100.0 fL   MCH 29.1 26.0 - 34.0 pg   MCHC 34.7 30.0 - 36.0 g/dL   RDW 13.2 11.5 - 15.5 %   Platelets 160 150 - 400 K/uL   nRBC 0.0 0.0 - 0.2 %    Comment: Performed at [facility], [place]  [address], [place], [place] [address]  CBG monitoring, ED     Status: Abnormal   Collection Time: 02/09/22  8:33 AM  Result Value Ref Range   Glucose-Capillary 102 (H) 70 - 99 mg/dL    Comment: Glucose reference range applies only to samples taken after fasting for at least 8 hours.    ECG   N/A - Personally Reviewed  Telemetry   Sinus rhythm/sinus bradycardia with short pauses, first-degree AV block- Personally Reviewed  Radiology    CT Head Wo Contrast  Result Date: 02/08/2022 CLINICAL DATA:  Transient ischemic attack (TIA) EXAM: CT HEAD WITHOUT CONTRAST TECHNIQUE: Contiguous axial images were obtained from the base of the skull through the vertex without intravenous contrast. RADIATION DOSE REDUCTION: This exam was performed according to the departmental dose-optimization program which includes automated exposure control, adjustment of the mA and/or kV according to patient size and/or use of iterative reconstruction technique. COMPARISON:  10/07/2020 FINDINGS: Brain: No evidence of acute infarction, hemorrhage, hydrocephalus, extra-axial collection or mass lesion/mass effect. Patchy low-density changes within the periventricular and subcortical white matter compatible with chronic microvascular ischemic change. Mild-moderate diffuse cerebral  volume loss. Vascular: Atherosclerotic calcifications involving the large vessels of the skull base. No unexpected hyperdense vessel. Skull: Normal. Negative for fracture or focal lesion. Sinuses/Orbits: No acute finding. Other: None. IMPRESSION: 1. No acute intracranial findings. 2. Chronic microvascular ischemic change and cerebral volume loss. Electronically Signed   By: Davina Poke D.O.   On: 02/08/2022 13:38   DG Chest 2 View  Result Date: 02/08/2022 CLINICAL DATA:  89 year old male with sudden onset weakness. Chest pain. EXAM: CHEST - 2 VIEW COMPARISON:  Portable chest 09/26/2021 and earlier. FINDINGS: Semi upright AP and lateral views at 1049  hours. Stable lung volumes compared to last year, more normal on the initial lateral view. Stable cardiac size and mediastinal contours. Cardiac size at the upper limits of normal. Visualized tracheal air column is within normal limits. Chronic left thoracic inlet surgical clips. No pneumothorax, pulmonary edema, pleural effusion or confluent pulmonary opacity. No acute osseous abnormality identified. Negative visible bowel gas. Abdominal Calcified aortic atherosclerosis. IMPRESSION: No acute cardiopulmonary abnormality. Abdominal  Aortic Atherosclerosis (ICD10-I70.0). Electronically Signed   By: Genevie Ann M.D.   On: 02/08/2022 11:03    Cardiac Studies   N/A  Assessment   Principal Problem:   Syncope, cardiogenic Active Problems:   CKD (chronic kidney disease), stage IV (HCC)   DM (diabetes mellitus), type 2 with renal complications ([place])   History of stroke   Essential hypertension   Hypothyroidism (acquired)   Hyperlipidemia, unspecified   DNR (do not resuscitate)   Plan   Monitor did not reveal any significant pauses or heart block overnight.  He has had some persistent dizziness.  Heart rate did not increase significantly with ambulation.  Limited echo is pending.  He will likely be set up for 30-day outpatient monitor.  His daughter noted that he had some symptoms associated with frequent constipation which is an issue suggesting possibly vagally mediated events.  Time Spent Directly with Patient:  I have spent a total of 25 minutes with the patient reviewing hospital notes, telemetry, EKGs, labs and examining the patient as well as establishing an assessment and plan that was discussed personally with the patient.  > 50% of time was spent in direct patient care.  Length of Stay:  LOS: 0 days   Pixie Casino, MD, [facility], [place] Director of the [facility]  [facility] Diplomate of the American Board of  Clinical Lipidology Attending Cardiologist  Direct Dial: [phone]  Fax: [phone]  Website:  [email or web address] 02/09/2022, 10:37 AM

## 2022-02-09 NOTE — Progress Notes (Signed)
PROGRESS NOTE    Martin Richardson  GNF:[id number] DOB: Jan 18, 1926 DOA: 02/08/2022 PCP: Kristen Loader, FNP   Brief Narrative:  Martin Richardson is a 89 y.o. 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  He presents to the hospital on 02/08/2022 for weakness and confusion in which she was concerned he was having a recurrent stroke.  In the ED patient was noted to have first-degree heart block and bradycardia which is thought to be the primary cause of his symptoms.  Hospitalist called for admission, cardiology called for consult.  Assessment & Plan:   Principal Problem:   Syncope, cardiogenic Active Problems:   CKD (chronic kidney disease), stage IV (HCC)   DM (diabetes mellitus), type 2 with renal complications (HCC)   History of stroke   Essential hypertension   Hypothyroidism (acquired)   Hyperlipidemia, unspecified   DNR (do not resuscitate)   Acute symptomatic bradycardia -Cardiology consulted, appreciate insight and recommendations -Patient has known chronic first-degree heart block, symptoms acutely likely in the setting of profound bradycardia with prolonged PR interval -Currently awaiting echocardiogram for further evaluation, likely will require long-term monitoring whether this is via loop recorder vs zio patch -Hold all potentially nodal-blocking agents -Minimal improvement in heart rate with exertion   IDDM2, uncontrolled with hyperglycemia -Last A1c above goal -hold Glimepride -Continue decreased Lantus dose -Cover with sensitive-scale SSI, hypoglycemic protocol    HTN, essential -Hold amlodipine (daughter says he is very noncompliant with this medication) -Continue tamsulosin for BPH as well -Goal BP is <160   HLD -Continue atorvastatin   Glaucoma -Continue timolol   Hypothyroidism -Continue Synthroid  Progressive CKD4 -Stage 4 CKD,  appears to be progressing over the past year -Attempt to avoid nephrotoxic medications -Recheck BMP in AM    Goals of care -Patient is DNR -Palliative care discussed and daughter thinks a consult would be helpful given 3 daughters in different places who all want to be involved in his care -General goal is to treat the treatable and make sure comfort is a priority   DVT prophylaxis: Lovenox Code Status: DNR Family Communication: None present  Status is: Inpt  Dispo: The patient is from: Home              Anticipated d/c is to: TBD              Anticipated d/c date is: TBD              Patient currently NOT medically stable for discharge  Consultants:  Cardiology  Procedures:  None  Antimicrobials:  None   Subjective: No acute issues/events overnight -intermittent bradycardia with first-degree AV block noted on telemetry  Objective: Vitals:   02/09/22 0300 02/09/22 0500 02/09/22 0508 02/09/22 0600  BP: (!) 146/76 (!) 181/79  113/82  Pulse: (!) 37 (!) 51  (!) 50  Resp: '16 12  13  '$ Temp:   97.9 F (36.6 C)   TempSrc:   Oral   SpO2: 93% 96%  96%  Weight:      Height:        Intake/Output Summary (Last 24 hours) at 02/09/2022 0759 Last data filed at 02/09/2022 0551 Gross per 24 hour  Intake 573.45 ml  Output --  Net 573.45 ml   Filed Weights   02/08/22 1022  Weight: 103.4 kg    Examination:  General:  Pleasantly resting in bed, No acute distress. HEENT:  Normocephalic atraumatic.  Sclerae nonicteric, noninjected.  Extraocular movements intact bilaterally. Neck:  Without mass or deformity.  Trachea is midline. Lungs:  Clear to auscultate bilaterally without rhonchi, wheeze, or rales. Heart:  Regular rate and rhythm.  Without murmurs, rubs, or gallops. Abdomen:  Soft, nontender, nondistended.  Without guarding or rebound. Extremities: Without cyanosis, clubbing, edema, or obvious deformity. Vascular:  Dorsalis pedis and posterior tibial pulses palpable  bilaterally. Skin:  Warm and dry, no erythema, no ulcerations.   Data Reviewed: I have personally reviewed following labs and imaging studies  CBC: Recent Labs  Lab 02/08/22 1027 02/09/22 0240  WBC 6.5 6.8  NEUTROABS 3.9  --   HGB 12.4* 12.2*  HCT 35.8* 35.2*  MCV 85.2 84.0  PLT 154 956   Basic Metabolic Panel: Recent Labs  Lab 02/08/22 1027 02/09/22 0240  NA 137 138  K 4.5 4.3  CL 107 109  CO2 22 22  GLUCOSE 181* 106*  BUN 38* 36*  CREATININE 2.40* 2.25*  CALCIUM 9.5 9.5   GFR: Estimated Creatinine Clearance: 24.6 mL/min (A) (by C-G formula based on SCr of 2.25 mg/dL (H)). Liver Function Tests: Recent Labs  Lab 02/08/22 1027  AST 18  ALT 16  ALKPHOS 65  BILITOT 0.8  PROT 5.9*  ALBUMIN 3.3*   No results for input(s): "LIPASE", "AMYLASE" in the last 168 hours. No results for input(s): "AMMONIA" in the last 168 hours. Coagulation Profile: No results for input(s): "INR", "PROTIME" in the last 168 hours. Cardiac Enzymes: No results for input(s): "CKTOTAL", "CKMB", "CKMBINDEX", "TROPONINI" in the last 168 hours. BNP (last 3 results) No results for input(s): "PROBNP" in the last 8760 hours. HbA1C: No results for input(s): "HGBA1C" in the last 72 hours. CBG: Recent Labs  Lab 02/08/22 1251 02/08/22 1745 02/08/22 2138  GLUCAP 183* 152* 196*   Lipid Profile: No results for input(s): "CHOL", "HDL", "LDLCALC", "TRIG", "CHOLHDL", "LDLDIRECT" in the last 72 hours. Thyroid Function Tests: No results for input(s): "TSH", "T4TOTAL", "FREET4", "T3FREE", "THYROIDAB" in the last 72 hours. Anemia Panel: No results for input(s): "VITAMINB12", "FOLATE", "FERRITIN", "TIBC", "IRON", "RETICCTPCT" in the last 72 hours. Sepsis Labs: No results for input(s): "PROCALCITON", "LATICACIDVEN" in the last 168 hours.  No results found for this or any previous visit (from the past 240 hour(s)).       Radiology Studies: CT Head Wo Contrast  Result Date: 02/08/2022 CLINICAL  DATA:  Transient ischemic attack (TIA) EXAM: CT HEAD WITHOUT CONTRAST TECHNIQUE: Contiguous axial images were obtained from the base of the skull through the vertex without intravenous contrast. RADIATION DOSE REDUCTION: This exam was performed according to the departmental dose-optimization program which includes automated exposure control, adjustment of the mA and/or kV according to patient size and/or use of iterative reconstruction technique. COMPARISON:  10/07/2020 FINDINGS: Brain: No evidence of acute infarction, hemorrhage, hydrocephalus, extra-axial collection or mass lesion/mass effect. Patchy low-density changes within the periventricular and subcortical white matter compatible with chronic microvascular ischemic change. Mild-moderate diffuse cerebral volume loss. Vascular: Atherosclerotic calcifications involving the large vessels of the skull base. No unexpected hyperdense vessel. Skull: Normal. Negative for fracture or focal lesion. Sinuses/Orbits: No acute finding. Other: None. IMPRESSION: 1. No acute intracranial findings. 2. Chronic microvascular ischemic change and cerebral volume loss. Electronically Signed   By: Davina Poke D.O.   On: 02/08/2022 13:38   DG Chest 2 View  Result Date: 02/08/2022 CLINICAL DATA:  89 year old male with sudden onset weakness. Chest pain. EXAM: CHEST - 2 VIEW COMPARISON:  Portable chest 09/26/2021 and earlier. FINDINGS: Semi upright AP and lateral views at 1049 hours. Stable lung volumes compared to last year, more normal on the initial lateral view. Stable cardiac size and mediastinal contours. Cardiac size at the upper limits of normal. Visualized tracheal air column is within normal limits. Chronic left thoracic inlet surgical clips. No pneumothorax, pulmonary edema, pleural effusion or confluent pulmonary opacity. No acute osseous abnormality identified. Negative visible bowel gas. Abdominal Calcified aortic atherosclerosis. IMPRESSION: No acute  cardiopulmonary abnormality. Abdominal  Aortic Atherosclerosis (ICD10-I70.0). Electronically Signed   By: Genevie Ann M.D.   On: 02/08/2022 11:03     Scheduled Meds:  aspirin EC  81 mg Oral Daily   atorvastatin  80 mg Oral Daily   docusate sodium  100 mg Oral BID   enoxaparin (LOVENOX) injection  30 mg Subcutaneous Q24H   famotidine  20 mg Oral QHS   gabapentin  200 mg Oral QHS   insulin aspart  0-9 Units Subcutaneous TID WC   insulin glargine-yfgn  20 Units Subcutaneous BID   levothyroxine  125 mcg Oral QAC breakfast   pantoprazole  40 mg Oral Daily   polyethylene glycol  17 g Oral BID   senna  1 tablet Oral BID   sodium chloride flush  3 mL Intravenous Q12H   tamsulosin  0.8 mg Oral QPC supper   timolol  1 drop Both Eyes BID   Continuous Infusions:   LOS: 0 days   Time spent: 13mn  Christina Gintz C Adylene Dlugosz, DO [facility]  If 7PM-7AM, please contact night-coverage [email or web address]  02/09/2022, 7:59 AM

## 2022-02-09 NOTE — Evaluation (Signed)
Occupational Therapy Evaluation Patient Details Name: Martin Richardson MRN: [id number] DOB: 10-18-1925 Today's Date: 02/09/2022   History of Present Illness Pt is a 89 y.o. male admitted 02/08/22 with acute onset weakness and confusion, eventually able to push Life Alert button. Concern for cardiogenic syncope, particularly in the setting of very prolonged PR interval and bradycardia. Head CT negative for acute injury; chronic microvascular ischemia. PMH includes afib (not on anticoagulation), left eye blindness, kidney CA, arthritis, cataract, CKD, DM, glaucoma, HTN, neuropathy, prior strokes .   Clinical Impression   Martin Richardson was evaluated s/p the above admission list, he is typically mod I at baseline, he lives alone and drives short distances. Upon evaluation pt had mild functional limitations due to generalized weakness, baseline bilat knee pain and decreased activity tolerance. Overall he required superivsion - min G for mobility with SPC, and up to min A for LB ADLs. OT to continue to follow acutely. Recommend d/c home with increased assist from family for medication management and OPOT.     Recommendations for follow up therapy are one component of a multi-disciplinary discharge planning process, led by the attending physician.  Recommendations may be updated based on patient status, additional functional criteria and insurance authorization.   Follow Up Recommendations  Outpatient OT     Assistance Recommended at Discharge Intermittent Supervision/Assistance  Patient can return home with the following A little help with walking and/or transfers;A little help with bathing/dressing/bathroom;Assistance with cooking/housework;Help with stairs or ramp for entrance;Assist for transportation    Functional Status Assessment  Patient has had a recent decline in their functional status and demonstrates the ability to make significant improvements in function in a reasonable and predictable amount of  time.  Equipment Recommendations  None recommended by OT    Recommendations for Other Services       Precautions / Restrictions Precautions Precautions: Fall;Other (comment) Precaution Comments: HR 50s-60s Restrictions Weight Bearing Restrictions: No      Mobility Bed Mobility Overal bed mobility: Modified Independent                  Transfers Overall transfer level: Needs assistance Equipment used: Straight cane Transfers: Sit to/from Stand Sit to Stand: Min guard                  Balance Overall balance assessment: Needs assistance Sitting-balance support: No upper extremity supported, Feet supported Sitting balance-[name]: Fair Sitting balance - Comments: assist to don shoes   Standing balance support: No upper extremity supported, Single extremity supported, During functional activity Standing balance-[name]: Fair                             ADL either performed or assessed with clinical judgement   ADL Overall ADL's : Needs assistance/impaired Eating/Feeding: Independent   Grooming: Min guard;Standing   Upper Body Bathing: Set up;Sitting   Lower Body Bathing: Min guard;Sit to/from stand   Upper Body Dressing : Set up;Sitting   Lower Body Dressing: Minimal assistance;Sit to/from stand Lower Body Dressing Details (indicate cue type and reason): assist for shoes/socks Toilet Transfer: Min guard;Ambulation (SPC)   Toileting- Clothing Manipulation and Hygiene: Supervision/safety;Sitting/lateral lean       Functional mobility during ADLs: Min guard;Cane General ADL Comments: assist for safety, no overt LOB     Vision Baseline Vision/History: 2 Legally blind (L eye) Patient Visual Report: No change from baseline Vision Assessment?: No apparent visual deficits  Perception Perception Perception Tested?: No   Praxis Praxis Praxis tested?: Not tested    Pertinent Vitals/Pain Pain Assessment Pain Assessment:  No/denies pain     Hand Dominance Right   Extremity/Trunk Assessment Upper Extremity Assessment Upper Extremity Assessment: Overall WFL for tasks assessed   Lower Extremity Assessment Lower Extremity Assessment: Defer to PT evaluation (bilat knee pain - chronic)   Cervical / Trunk Assessment Cervical / Trunk Assessment: Kyphotic (mild)   Communication Communication Communication: No difficulties   Cognition Arousal/Alertness: Awake/alert Behavior During Therapy: WFL for tasks assessed/performed Overall Cognitive Status: Within Functional Limits for tasks assessed                                       General Comments  BP elevated, daughter present and supportive    Exercises     Shoulder Instructions      Home Living Family/patient expects to be discharged to:: Private residence Living Arrangements: Alone Available Help at Discharge: Family;Available 24 hours/day Type of Home: House Home Access: Elevator     Home Layout: One level     Bathroom Shower/Tub: Occupational psychologist: Handicapped height Bathroom Accessibility: Yes   Home Equipment: Conservation officer, nature (2 wheels);Rollator (4 wheels);Cane - single point;Shower seat   Additional Comments: daughters live nearby and check on pt. pt wears Life Alert button "90% of the time"      Prior Functioning/Environment Prior Level of Function : Independent/Modified Independent;Driving             Mobility Comments: mod indep typically using SPC, though also has rollator and w/c (sometimes pushes it for nightly trips to bathroom). drives short distances, enjoys going to church on Sundays and eating meals out with daughters. reports getting lonely ADLs Comments: reports mod indep with bathing, dressing, toileting, meal prep; drives to grocery store        OT Problem List: Decreased strength;Decreased range of motion;Decreased activity tolerance;Impaired balance (sitting and/or  standing);Decreased safety awareness      OT Treatment/Interventions: Self-care/ADL training;Therapeutic exercise;DME and/or AE instruction;Therapeutic activities;Patient/family education;Balance training    OT Goals(Current goals can be found in the care plan section) Acute Rehab OT Goals Patient Stated Goal: home today OT Goal Formulation: With patient Time For Goal Achievement: 02/23/22 Potential to Achieve Goals: Good ADL Goals Pt Will Perform Lower Body Dressing: with modified independence;sit to/from stand Pt Will Transfer to Toilet: with modified independence;ambulating Additional ADL Goal #1: Pt will indep complete IADL medicaiton management task  OT Frequency: Min 2X/week    Co-evaluation              AM-PAC OT "6 Clicks" Daily Activity     Outcome Measure Help from another person eating meals?: None Help from another person taking care of personal grooming?: A Little Help from another person toileting, which includes using toliet, bedpan, or urinal?: A Little Help from another person bathing (including washing, rinsing, drying)?: A Little Help from another person to put on and taking off regular upper body clothing?: A Little Help from another person to put on and taking off regular lower body clothing?: A Little 6 Click Score: 19   End of Session Equipment Utilized During Treatment: Other (comment) [facility]) Nurse Communication: Mobility status  Activity Tolerance: Patient tolerated treatment well Patient left: in bed;with call bell/phone within reach;with family/visitor present  OT Visit Diagnosis: Unsteadiness on feet (R26.81);Other abnormalities of gait  and mobility (R26.89);Muscle weakness (generalized) (M62.81)                Time: 0981-1914 OT Time Calculation (min): 27 min Charges:  OT General Charges $OT Visit: 1 Visit OT Evaluation $OT Eval Moderate Complexity: 1 Mod OT Treatments $Self Care/Home Management : 8-22 mins   Elliot Cousin 02/09/2022,  10:15 AM

## 2022-02-09 NOTE — Progress Notes (Signed)
Pt arrived to [facility] room 25 via stretcher from ED at 1400. Pt A&O x4, has IV x1, on room air. Vitals stable. Weight obtained. Pt placed on continuous cardiac monitor. Bed alarm turned on. Pt given call bell and oriented to unit and room. Daughter at bedside.

## 2022-02-09 NOTE — ED Notes (Signed)
Patient has sleep apnea and continues to desat to 70% when sleeping.  Reports he use to wear a CPAP but it causes sores on his nose so he quit wearing it.

## 2022-02-09 NOTE — ED Notes (Signed)
Ambulatory to bathroom with cane and 1 person assist.

## 2022-02-09 NOTE — Evaluation (Signed)
Physical Therapy Evaluation Patient Details Name: Martin Richardson MRN: [id number] DOB: 01-27-1926 Today's Date: 02/09/2022  History of Present Illness  Pt is a 89 y.o. male admitted 02/08/22 with acute onset weakness and confusion, eventually able to push Life Alert button. Concern for cardiogenic syncope, particularly in the setting of very prolonged PR interval and bradycardia. Head CT negative for acute injury; chronic microvascular ischemia. PMH includes afib (not on anticoagulation), left eye blindness, kidney CA, arthritis, cataract, CKD, DM, glaucoma, HTN, neuropathy, prior strokes .   Clinical Impression  Pt presents with an overall decrease in functional mobility secondary to above. PTA, pt mod indep with SPC or rollator, lives alone with supportive daughters and neighbors nearby, pt drives, wears Life Alert button but denies recent h/o falls. Today, pt moving well with SPC; denies SOB or dizziness. Pt requesting PT treatment for h/o vertigo - discussed options for this. Pt hopeful for d/c home soon; if to remain admitted, will follow acutely to address established goals.    HR 50s-60s Post-mobility BP 168/68 (100)    Recommendations for follow up therapy are one component of a multi-disciplinary discharge planning process, led by the attending physician.  Recommendations may be updated based on patient status, additional functional criteria and insurance authorization.  Follow Up Recommendations Outpatient PT (vestibular)      Assistance Recommended at Discharge PRN  Patient can return home with the following  Assistance with cooking/housework;Assist for transportation    Equipment Recommendations None recommended by PT  Recommendations for Other Services       Functional Status Assessment Patient has had a recent decline in their functional status and demonstrates the ability to make significant improvements in function in a reasonable and predictable amount of time.      Precautions / Restrictions Precautions Precautions: Fall;Other (comment) Precaution Comments: HR 50s-60s Restrictions Weight Bearing Restrictions: No      Mobility  Bed Mobility Overal bed mobility: Modified Independent             General bed mobility comments: mod indep with HOB elevated on ED stretcher    Transfers Overall transfer level: Needs assistance Equipment used: Straight cane Transfers: Sit to/from Stand Sit to Stand: Supervision           General transfer comment: multiple sit<>stands from edge of stretcher with [facility]    Ambulation/Gait Ambulation/Gait assistance: Min guard, Supervision Gait Distance (Feet): 220 Feet Assistive device: Straight cane Gait Pattern/deviations: Step-through pattern, Decreased stride length, Trunk flexed Gait velocity: Decreased     General Gait Details: slow, mostly steady gait with SPC and initial min guard for balance, progressing to supervision for safety/lines  Stairs            Wheelchair Mobility    Modified Rankin (Stroke Patients Only)       Balance Overall balance assessment: Needs assistance Sitting-balance support: No upper extremity supported, Feet supported Sitting balance-[name]: Fair Sitting balance - Comments: assist to don shoes   Standing balance support: No upper extremity supported, Single extremity supported, During functional activity Standing balance-[name]: Fair Standing balance comment: can stand and take steps without DME, preference for [facility]                             Pertinent Vitals/Pain Pain Assessment Pain Assessment: No/denies pain    Home Living Family/patient expects to be discharged to:: Private residence Living Arrangements: Alone Available Help at Discharge: Family;Available 24 hours/day  Type of Home: House (condo) Home Access: Elevator       Home Layout: One level Home Equipment: Conservation officer, nature (2 wheels);Rollator (4 wheels);Cane -  single point;Shower seat Additional Comments: daughters live nearby and check on pt. pt wears Life Alert button "90% of the time"    Prior Function Prior Level of Function : Independent/Modified Independent;Driving             Mobility Comments: mod indep typically using SPC, though also has rollator and w/c (sometimes pushes it for nightly trips to bathroom). drives short distances, enjoys going to church on Sundays and eating meals out with daughters. reports getting lonely ADLs Comments: reports mod indep with bathing, dressing, toileting, meal prep; drives to grocery store     Hand Dominance        Extremity/Trunk Assessment   Upper Extremity Assessment Upper Extremity Assessment: Overall WFL for tasks assessed    Lower Extremity Assessment Lower Extremity Assessment: Overall WFL for tasks assessed    Cervical / Trunk Assessment Cervical / Trunk Assessment: Kyphotic  Communication   Communication: No difficulties  Cognition Arousal/Alertness: Awake/alert Behavior During Therapy: WFL for tasks assessed/performed Overall Cognitive Status: Within Functional Limits for tasks assessed                                          General Comments General comments (skin integrity, edema, etc.): daughter present and supportive. HR 50s-60s. pt asking about PT treatment for h/o vertigo (sounds like BPPV? that he has had tx for in past and feels like he could use again)    Exercises     Assessment/Plan    PT Assessment Patient needs continued PT services  PT Problem List Decreased activity tolerance;Decreased balance;Decreased mobility;Cardiopulmonary status limiting activity       PT Treatment Interventions DME instruction;Gait training;Functional mobility training;Therapeutic activities;Therapeutic exercise;Balance training;Patient/family education    PT Goals (Current goals can be found in the Care Plan section)  Acute Rehab PT Goals Patient Stated  Goal: return home, interested in more tx for vertigo PT Goal Formulation: With patient/family Time For Goal Achievement: 02/23/22 Potential to Achieve Goals: Good    Frequency Min 3X/week     Co-evaluation               AM-PAC PT "6 Clicks" Mobility  Outcome Measure Help needed turning from your back to your side while in a flat bed without using bedrails?: None Help needed moving from lying on your back to sitting on the side of a flat bed without using bedrails?: None Help needed moving to and from a bed to a chair (including a wheelchair)?: A Little Help needed standing up from a chair using your arms (e.g., wheelchair or bedside chair)?: A Little Help needed to walk in hospital room?: A Little Help needed climbing 3-5 steps with a railing? : A Little 6 Click Score: 20    End of Session Equipment Utilized During Treatment: Gait belt Activity Tolerance: Patient tolerated treatment well Patient left: in bed;with call bell/phone within reach;with family/visitor present Nurse Communication: Mobility status PT Visit Diagnosis: Other abnormalities of gait and mobility (R26.89);Muscle weakness (generalized) (M62.81)    Time: 7408-1448 PT Time Calculation (min) (ACUTE ONLY): 19 min   Charges:   PT Evaluation $PT Eval Moderate Complexity: 1 Mod        Mabeline Caras, PT, DPT Acute Rehabilitation Services  Personal: Secure Chat Rehab Office: [place] 02/09/2022, 9:29 AM

## 2022-02-09 NOTE — Progress Notes (Signed)
Rounding Note    Patient Name: Martin Richardson Date of Encounter: 02/09/2022  [place] Cardiologist: Sinclair Grooms, MD ***  Subjective   ***  Inpatient Medications    Scheduled Meds:  aspirin EC  81 mg Oral Daily   atorvastatin  80 mg Oral Daily   docusate sodium  100 mg Oral BID   enoxaparin (LOVENOX) injection  30 mg Subcutaneous Q24H   famotidine  20 mg Oral QHS   gabapentin  200 mg Oral QHS   insulin aspart  0-9 Units Subcutaneous TID WC   insulin glargine-yfgn  20 Units Subcutaneous BID   levothyroxine  125 mcg Oral QAC breakfast   pantoprazole  40 mg Oral Daily   polyethylene glycol  17 g Oral BID   senna  1 tablet Oral BID   sodium chloride flush  3 mL Intravenous Q12H   tamsulosin  0.8 mg Oral QPC supper   timolol  1 drop Both Eyes BID   Continuous Infusions:  PRN Meds: acetaminophen **OR** acetaminophen, acidophilus, bisacodyl, ondansetron **OR** ondansetron (ZOFRAN) IV, sodium phosphate   Vital Signs    Vitals:   02/09/22 2013 02/09/22 2014 02/09/22 2016 02/09/22 2019  BP: (!) 158/68 (!) 160/75 (!) 150/73 (!) 162/74  Pulse: (!) 59 63 66 65  Resp:      Temp:      TempSrc:      SpO2:      Weight:      Height:        Intake/Output Summary (Last 24 hours) at 02/09/2022 2101 Last data filed at 02/09/2022 1914 Gross per 24 hour  Intake 693.45 ml  Output 450 ml  Net 243.45 ml      02/09/2022    2:08 PM 02/08/2022   10:22 AM 12/11/2021   10:38 AM  Last 3 Weights  Weight (lbs) 225 lb 5 oz 228 lb 231 lb  Weight (kg) 102.2 kg 103.42 kg 104.781 kg      Telemetry    *** - Personally Reviewed  ECG    *** - Personally Reviewed  Physical Exam  *** GEN: No acute distress.   Neck: No JVD Cardiac: RRR, no murmurs, rubs, or gallops.  Respiratory: Clear to auscultation bilaterally. GI: Soft, nontender, non-distended  MS: No edema; No deformity. Neuro:  Nonfocal  Psych: Normal affect   Labs    High Sensitivity Troponin:    Recent Labs  Lab 02/08/22 1027 02/08/22 1246  TROPONINIHS 57* 58*     Chemistry Recent Labs  Lab 02/08/22 1027 02/09/22 0240  NA 137 138  K 4.5 4.3  CL 107 109  CO2 22 22  GLUCOSE 181* 106*  BUN 38* 36*  CREATININE 2.40* 2.25*  CALCIUM 9.5 9.5  PROT 5.9*  --   ALBUMIN 3.3*  --   AST 18  --   ALT 16  --   ALKPHOS 65  --   BILITOT 0.8  --   GFRNONAA 24* 26*  ANIONGAP 8 7    Lipids No results for input(s): "CHOL", "TRIG", "HDL", "LABVLDL", "LDLCALC", "CHOLHDL" in the last 168 hours.  Hematology Recent Labs  Lab 02/08/22 1027 02/09/22 0240  WBC 6.5 6.8  RBC 4.20* 4.19*  HGB 12.4* 12.2*  HCT 35.8* 35.2*  MCV 85.2 84.0  MCH 29.5 29.1  MCHC 34.6 34.7  RDW 13.2 13.2  PLT 154 160   Thyroid No results for input(s): "TSH", "FREET4" in the last 168 hours.  BNP Recent  Labs  Lab 02/08/22 1034  BNP 87.8    DDimer No results for input(s): "DDIMER" in the last 168 hours.   Radiology    ECHOCARDIOGRAM LIMITED  Result Date: 02/09/2022    ECHOCARDIOGRAM LIMITED REPORT   Patient Name:   JAFETH MUSTIN Schlotter Date of Exam: 02/09/2022 Medical Rec #:  [id number]         Height:       71.0 in Accession #:    [id number]        Weight:       228.0 lb Date of Birth:  [birth year]         BSA:          2.229 m Patient Age:    21 years          BP:           142/65 mmHg Patient Gender: M                 HR:           51 bpm. Exam Location:  Inpatient Procedure: Limited Echo, Cardiac Doppler and Limited Color Doppler Indications:    CVA;  History:        Patient has prior history of Echocardiogram examinations, most                 recent 10/07/2020. Stroke, Signs/Symptoms:Syncope and Chest Pain;                 Risk Factors:Diabetes, Hypertension, Dyslipidemia and Former                 Smoker.  Sonographer:    Greer Pickerel Referring Phys: [place]  1. Limited study.  2. Left ventricular ejection fraction, by estimation, is 60 to 65%. The left ventricle has normal function.  The left ventricle has no regional wall motion abnormalities. There is mild left ventricular hypertrophy. Left ventricular diastolic function could not be evaluated.  3. Right ventricular systolic function is normal. The right ventricular size is normal.  4. The mitral valve is normal in structure. No evidence of mitral valve regurgitation. No evidence of mitral stenosis.  5. The aortic valve is tricuspid. Aortic valve regurgitation is trivial. Aortic valve sclerosis is present, with no evidence of aortic valve stenosis.  6. Aortic dilatation noted. There is mild dilatation of the aortic root, measuring 45 mm. FINDINGS  Left Ventricle: Left ventricular ejection fraction, by estimation, is 60 to 65%. The left ventricle has normal function. The left ventricle has no regional wall motion abnormalities. The left ventricular internal cavity size was normal in size. There is  mild left ventricular hypertrophy. Left ventricular diastolic function could not be evaluated. Right Ventricle: The right ventricular size is normal. Right ventricular systolic function is normal. Left Atrium: Left atrial size was normal in size. Right Atrium: Right atrial size was normal in size. Pericardium: There is no evidence of pericardial effusion. Mitral Valve: The mitral valve is normal in structure. No evidence of mitral valve stenosis. Tricuspid Valve: The tricuspid valve is normal in structure. Tricuspid valve regurgitation is not demonstrated. No evidence of tricuspid stenosis. Aortic Valve: The aortic valve is tricuspid. Aortic valve regurgitation is trivial. Aortic valve sclerosis is present, with no evidence of aortic valve stenosis. Pulmonic Valve: The pulmonic valve was normal in structure. Aorta: Aortic dilatation noted. There is mild dilatation of the aortic root, measuring 45 mm. Venous: The inferior vena cava was not well visualized. IAS/Shunts:  The interatrial septum was not well visualized. Additional Comments: Limited study.   LEFT VENTRICLE PLAX 2D LVOT diam:     1.90 cm LV SV:         74 LV SV Index:   33 LVOT Area:     2.84 cm  LV Volumes (MOD) LV vol d, MOD A4C: 162.0 ml LV vol s, MOD A4C: 66.2 ml LV SV MOD A4C:     162.0 ml AORTIC VALVE LVOT Vmax:   104.00 cm/s LVOT Vmean:  69.800 cm/s LVOT VTI:    0.260 m MITRAL VALVE MV Area (PHT): 2.45 cm    SHUNTS MV Decel Time: 310 msec    Systemic VTI:  0.26 m MV E velocity: 53.80 cm/s  Systemic Diam: 1.90 cm MV A velocity: 66.90 cm/s MV E/A ratio:  0.80 Kirk Ruths MD Electronically signed by Kirk Ruths MD Signature Date/Time: 02/09/2022/10:52:40 AM    Final    CT Head Wo Contrast  Result Date: 02/08/2022 CLINICAL DATA:  Transient ischemic attack (TIA) EXAM: CT HEAD WITHOUT CONTRAST TECHNIQUE: Contiguous axial images were obtained from the base of the skull through the vertex without intravenous contrast. RADIATION DOSE REDUCTION: This exam was performed according to the departmental dose-optimization program which includes automated exposure control, adjustment of the mA and/or kV according to patient size and/or use of iterative reconstruction technique. COMPARISON:  10/07/2020 FINDINGS: Brain: No evidence of acute infarction, hemorrhage, hydrocephalus, extra-axial collection or mass lesion/mass effect. Patchy low-density changes within the periventricular and subcortical white matter compatible with chronic microvascular ischemic change. Mild-moderate diffuse cerebral volume loss. Vascular: Atherosclerotic calcifications involving the large vessels of the skull base. No unexpected hyperdense vessel. Skull: Normal. Negative for fracture or focal lesion. Sinuses/Orbits: No acute finding. Other: None. IMPRESSION: 1. No acute intracranial findings. 2. Chronic microvascular ischemic change and cerebral volume loss. Electronically Signed   By: Davina Poke D.O.   On: 02/08/2022 13:38   DG Chest 2 View  Result Date: 02/08/2022 CLINICAL DATA:  89 year old male with sudden onset  weakness. Chest pain. EXAM: CHEST - 2 VIEW COMPARISON:  Portable chest 09/26/2021 and earlier. FINDINGS: Semi upright AP and lateral views at 1049 hours. Stable lung volumes compared to last year, more normal on the initial lateral view. Stable cardiac size and mediastinal contours. Cardiac size at the upper limits of normal. Visualized tracheal air column is within normal limits. Chronic left thoracic inlet surgical clips. No pneumothorax, pulmonary edema, pleural effusion or confluent pulmonary opacity. No acute osseous abnormality identified. Negative visible bowel gas. Abdominal Calcified aortic atherosclerosis. IMPRESSION: No acute cardiopulmonary abnormality. Abdominal  Aortic Atherosclerosis (ICD10-I70.0). Electronically Signed   By: Genevie Ann M.D.   On: 02/08/2022 11:03    Cardiac Studies   Echo from 10/07/20:   1. Left ventricular ejection fraction, by estimation, is 50 to 55%. The  left ventricle has low normal function. The left ventricle has no regional  wall motion abnormalities. Left ventricular diastolic parameters are  consistent with Grade I diastolic  dysfunction (impaired relaxation).   2. Right ventricular systolic function is normal. The right ventricular  size is normal.   3. Left atrial size was severely dilated.   4. The mitral valve is normal in structure. No evidence of mitral valve  regurgitation. No evidence of mitral stenosis.   5. The aortic valve is tricuspid. There is mild calcification of the  aortic valve. There is mild thickening of the aortic valve. Aortic valve  regurgitation is trivial. Mild aortic  valve sclerosis is present, with no  evidence of aortic valve stenosis.  Aortic valve area, by VTI measures 3.54 cm. Aortic valve mean gradient  measures 4.0 mmHg. Aortic valve Vmax measures 1.22 m/s.   6. Aortic dilatation noted. 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   7. The inferior vena cava is normal in  size with greater than 50%  respiratory variability, suggesting right atrial pressure of 3 mmHg.      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 Does have AV conduction system disease and may be at risk for high grade block. No current indication for pacing.     Left heart cath 12/16/2019:   Mid LM to Dist LM lesion is 20% stenosed. Mid LAD lesion is 50% stenosed. Dist LAD lesion is 80% stenosed. 2nd Diag lesion is 85% stenosed. Mid Cx lesion is 70% stenosed. LV end diastolic pressure is normal.   1. 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 Normal LVEDP   Plan: recommend medical management.   Patient Profile     89 y.o. male with history of recurrent ischemic CVA, hypertension, hyperlipidemia, type 2 diabetes, sinus bradycardia, hypothyroidism, CKD stage III-IV, anemia, first-degree AV block who presented with episode of presyncope in the setting of baseline bradycardia and significant first degree AVB for which Cardiology was consulted.   Assessment & Plan    Shoulder Pain Profound Weakness Presyncope First degree AVB with sinus bradycardia Patient presents with episode of sudden onset weakness, bilateral shoulder/neck pain and presyncope. Trop flat in 50s, BNP not elevated. Cr at baseline. ECG with NSR with profound first degree AVB (although this is chronic). CT head unremarkable. Patient states he has had similar episodes in the past where cardiac monitor revealed SB-NSR with average HR 62 with first degree AVB, no pauses and no Afib. Thought to be some tachy-brady syndrome but given no symptoms at that time, was recommended for continued monitoring. TTE 09/2020 with LVEF 50-55%,  normal RV, severe LAE, moderate  aortic dilation. On telemetry here, he is in sinus bradycardia with HR as low as 40s. Has <3s pauses since arrival. Overall, there is concern that his episode was related to bradycardic event in the setting of known significant first degree AVB and bradycardia. May also have some vagal element as patient did report urge to have BM prior to the event (episodes have occurred in the past without that sensation, however). Will monitor on tele overnight and check limited TTE to ensure no interval change in EF. Likely will need loop in future if cardiac monitor unrevealing. -Monitor on telemetry  -If tele unrevealing, likely would benefit from loop for further evaluation vs 30day monitor and then loop in the future if 30day monitor unrevealing -Check limited TTE to ensure no change in LVEF   CAD -LHC 2021 with 50% mLAD, 80% distal LAD, 85% D2, 70% Lcx. Was recommended for continued medical management -High sensitive troponin flat, not consistent with ACS -Continue medical therapy with aspirin, statin -Not on BB due to bradycardia as detailed above   Hypertension Hyperlipidemia History of recurrent CVA Type 2 diabetes CKD stage III-IV -Per primary team  {Are we signing off today?:[id number]}  For questions or updates, please contact  [facility] Please consult [email or web address] for contact info under        Signed, Freada Bergeron, MD  02/09/2022, 9:01 PM

## 2022-02-09 NOTE — Progress Notes (Signed)
Orthostatics are as follows Lying: 158/68, 59 Sitting 160/75, 63 Standing 150/73, 66 Standing 3 minutes 162/74, 65

## 2022-02-10 ENCOUNTER — Ambulatory Visit (INDEPENDENT_AMBULATORY_CARE_PROVIDER_SITE_OTHER): Payer: Medicare Other

## 2022-02-10 ENCOUNTER — Other Ambulatory Visit: Payer: Self-pay | Admitting: Cardiology

## 2022-02-10 DIAGNOSIS — I440 Atrioventricular block, first degree: Secondary | ICD-10-CM

## 2022-02-10 DIAGNOSIS — I10 Essential (primary) hypertension: Secondary | ICD-10-CM

## 2022-02-10 DIAGNOSIS — R42 Dizziness and giddiness: Secondary | ICD-10-CM

## 2022-02-10 DIAGNOSIS — R001 Bradycardia, unspecified: Secondary | ICD-10-CM

## 2022-02-10 DIAGNOSIS — E782 Mixed hyperlipidemia: Secondary | ICD-10-CM

## 2022-02-10 DIAGNOSIS — R55 Syncope and collapse: Secondary | ICD-10-CM | POA: Diagnosis not present

## 2022-02-10 LAB — GLUCOSE, CAPILLARY
Glucose-Capillary: 157 mg/dL — ABNORMAL HIGH (ref 70–99)
Glucose-Capillary: 165 mg/dL — ABNORMAL HIGH (ref 70–99)

## 2022-02-10 MED ORDER — INSULIN GLARGINE 100 UNIT/ML ~~LOC~~ SOLN: 20.0000 [IU] | Freq: Two times a day (BID) | SUBCUTANEOUS | 0 refills | Status: AC

## 2022-02-10 NOTE — Evaluation (Signed)
Clinical/Bedside Swallow Evaluation Patient Details  Name: Martin Richardson MRN: [id number] Date of Birth: [birth year]  Today's Date: 02/10/2022 Time: SLP Start Time (ACUTE ONLY): 0841 SLP Stop Time (ACUTE ONLY): 0901 SLP Time Calculation (min) (ACUTE ONLY): 20 min  Past Medical History:  Past Medical History:  Diagnosis Date   Arthritis    Blind left eye    Cancer of left kidney ([place])    Cataract    Chronic kidney disease    Constipation    Diabetes mellitus without complication ([place])    Glaucoma    Hypertension    Neuropathy    Stroke [facility])    Past Surgical History:  Past Surgical History:  Procedure Laterality Date   CHOLECYSTECTOMY     LEFT HEART CATH AND CORONARY ANGIOGRAPHY N/A 12/16/2019   Procedure: LEFT HEART CATH AND CORONARY ANGIOGRAPHY;  Surgeon: Martinique, Peter M, MD;  Location: [place] [facility];  Service: Cardiovascular;  Laterality: N/A;   ORIF ANKLE FRACTURE Right 08/13/2019   Procedure: OPEN REDUCTION INTERNAL FIXATION (ORIF) ANKLE FRACTURE;  Surgeon: Renette Butters, MD;  Location: [place];  Service: Orthopedics;  Laterality: Right;   THYROID SURGERY     TONSILLECTOMY     HPI:  Pt is a 89 y.o. male admitted 02/08/22 with acute onset weakness and confusion, eventually able to push Life Alert button. Concern for cardiogenic syncope, particularly in the setting of very prolonged PR interval and bradycardia. Head CT negative for acute injury; chronic microvascular ischemia. CXR also negative. Per MD BSE order, "Pt verbalizes easily choking on food. Has choking spells a few times each week". PMH includes afib (not on anticoagulation), left eye blindness, kidney CA, arthritis, cataract, CKD, DM, glaucoma, HTN, neuropathy, prior strokes , hx of esophageal stricture needing stretching in the past (per NP progress note 12/11/21).    Assessment / Plan / Recommendation  Clinical Impression  Pt presents with functional oropharyngeal swallow at bedside, with likely esophageal  component given reported hx of esophgeal stricture and dilation (about 10 years ago). He stated that he occasionally has feeling of food sticking in esophagus with certain dry foods. Sometimes he does not have an episode for a couple of weeks. Upon SLP arrival, pt with RN and had just consumed 5 whole pills at once with water without difficulty. Initial consecutive sipping of thin liquids by straw resulted in x1 brief overt cough. Subsequent small/large single sips were without clinical signs of aspiration. Puree and regular textures orally manipulated/masticated and cleared without difficulty. No reports of globus sensation throughout entirety of eval. Occasional swallowing difficulties appear more esophageal in nature, especially given hx. Recommend continue regular diet/thin liquids with adherence to swallow/reflux precautions. Pt does not desire to pursue GI f/u at this time, but reported he would if symptoms worsen/become more frequent. No further f/u from SLP indicated at this time and will s/o.  SLP Visit Diagnosis: Dysphagia, unspecified (R13.10)    Aspiration Risk  Mild aspiration risk    Diet Recommendation Regular;Thin liquid   Liquid Administration via: Cup;Straw Medication Administration: Whole meds with liquid Supervision: Patient able to self feed Compensations: Minimize environmental distractions;Slow rate;Small sips/bites;Follow solids with liquid Postural Changes: Seated upright at 90 degrees;Remain upright for at least 30 minutes after po intake    Other  Recommendations Recommended Consults: Consider esophageal assessment (Pt stated he would consider GI f/u if symptoms worsen/become more frequent) Oral Care Recommendations: Oral care BID    Recommendations for follow up therapy are one component of  a multi-disciplinary discharge planning process, led by the attending physician.  Recommendations may be updated based on patient status, additional functional criteria and insurance  authorization.  Follow up Recommendations No SLP follow up      Assistance Recommended at Discharge    Functional Status Assessment Patient has not had a recent decline in their functional status  Frequency and Duration            Prognosis        Swallow Study   General Date of Onset: 02/10/22 HPI: Pt is a 89 y.o. male admitted 02/08/22 with acute onset weakness and confusion, eventually able to push Life Alert button. Concern for cardiogenic syncope, particularly in the setting of very prolonged PR interval and bradycardia. Head CT negative for acute injury; chronic microvascular ischemia. CXR also negative. Per MD BSE order, "Pt verbalizes easily choking on food. Has choking spells a few times each week". PMH includes afib (not on anticoagulation), left eye blindness, kidney CA, arthritis, cataract, CKD, DM, glaucoma, HTN, neuropathy, prior strokes , hx of esophageal stricture needing stretching in the past (per NP progress note 12/11/21). Type of Study: Bedside Swallow Evaluation Previous Swallow Assessment: none per EMR Diet Prior to this Study: Regular;Thin liquids Temperature Spikes Noted: No Respiratory Status: Room air History of Recent Intubation: No Behavior/Cognition: Alert;Cooperative;Pleasant mood Oral Cavity Assessment: Within Functional Limits Oral Care Completed by SLP: No Oral Cavity - Dentition: Adequate natural dentition;Missing dentition Vision: Functional for self-feeding Self-Feeding Abilities: Able to feed self Patient Positioning: Postural control adequate for testing (upright at EOB) Baseline Vocal Quality: Normal Volitional Cough: Strong Volitional Swallow: Able to elicit    Oral/Motor/Sensory Function Overall Oral Motor/Sensory Function: Within functional limits   Ice Chips Ice chips: Not tested   Thin Liquid Thin Liquid: Within functional limits Presentation: Straw;Self Fed    Nectar Thick Nectar Thick Liquid: Not tested   Honey Thick Honey Thick  Liquid: Not tested   Puree Puree: Within functional limits Presentation: Spoon;Self Fed   Solid     Solid: Within functional limits Presentation: [place], [place], [place] Office Number: [phone]  Acie Fredrickson 02/10/2022,9:20 AM

## 2022-02-10 NOTE — Discharge Summary (Signed)
Physician Discharge Summary  Martin Richardson YTK:[id number] DOB: [birth year] DOA: 02/08/2022  PCP: Kristen Loader, FNP  Admit date: 02/08/2022 Discharge date: 02/10/2022  Admitted From: Home Disposition: Home  Recommendations for Outpatient Follow-up:  Follow up with PCP in 1-2 weeks Follow-up with cardiology for further evaluation per their schedule  Home Health: Patient PT Equipment/Devices: No new equipment  Discharge Condition: Stable CODE STATUS: DNR Diet recommendation: Low-salt low-fat low-carb diet  Brief/Interim Summary: Martin Richardson is a 89 y.o. 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  He presents to the hospital on 02/08/2022 for weakness and confusion in which she was concerned he was having a recurrent stroke.  In the ED patient was noted to have first-degree heart block and bradycardia which is thought to be the primary cause of his symptoms.  Hospitalist called for admission, cardiology called for consult.   Patient admitted as above with episode of presyncope and confusion concerning for stroke, noted to be profoundly bradycardic at intake.  Remainder of workup appears to be unremarkable, cardiology following currently recommending 30-day monitor at discharge.  Patient otherwise appears to be back to baseline no further episodes of dizziness lightheadedness presyncope syncope, review of systems otherwise essentially unremarkable at this point.  Patient's comorbid conditions appear stable.  Follow-up with PCP and cardiology in the outpatient setting as scheduled.  Discharge Diagnoses:  Principal Problem:   Syncope, cardiogenic Active Problems:   CKD (chronic kidney disease), stage IV (HCC)   DM (diabetes mellitus), type 2 with renal complications (HCC)   History of stroke   Essential hypertension   Hypothyroidism (acquired)   Hyperlipidemia,  unspecified   DNR (do not resuscitate)  Acute symptomatic bradycardia First-degree AV block with sinus bradycardia -Cardiology consulted, continue 30-day monitor, follow closely  IDDM2, uncontrolled with hyperglycemia -Last A1c above goal -Resume home medications, follow-up with PCP for repeat medication adjustment as appropriate -hesitant to make any aggressive changes given patient's age and comorbidities -more liberal A1c rather than strict control is likely appropriate given patient's age   HTN, essential -Continue home medications   HLD -Continue atorvastatin   Glaucoma -Continue timolol   Hypothyroidism -Continue Synthroid   Progressive CKD4 -Stage 4 CKD, appears to be progressing over the past year -Attempt to avoid nephrotoxic medications -Follow-up outpatient with PCP, unclear if nephrology follow-up is appropriate given patient's age and slowly progressing disease   Goals of care -Patient is DNR -Palliative care discussed and daughter thinks a consult would be helpful given 3 daughters in different places who all want to be involved in his care  Discharge Instructions   Allergies as of 02/10/2022       Reactions   Other Other (See Comments)   Other reaction(s): rash   Tape Rash   Other reaction(s): rash        Medication List     STOP taking these medications    Unisom Sleepgels 50 MG Caps Generic drug: diphenhydrAMINE HCl (Sleep)       TAKE these medications    acetaminophen 325 MG tablet Commonly known as: TYLENOL Take 650 mg by mouth every 4 (four) hours as needed for mild pain or headache.   Align 4 MG Caps Take 4 mg by mouth daily.   amLODipine 5 MG tablet Commonly known as: NORVASC Take 5 mg by mouth daily as needed (SBP> 140 or DBP> 80).   aspirin EC 81 MG tablet  Take 1 tablet (81 mg total) by mouth daily. Swallow whole.   atorvastatin 80 MG tablet Commonly known as: LIPITOR Take 80 mg by mouth daily.   Betimol 0.5 %  ophthalmic solution Generic drug: timolol Apply 1 drop to eye 2 (two) times daily.   cholecalciferol 25 MCG (1000 UNIT) tablet Commonly known as: VITAMIN D3 Take 1,000 Units by mouth daily.   docusate sodium 100 MG capsule Commonly known as: COLACE Take 100 mg by mouth 2 (two) times daily as needed for mild constipation.   famotidine 20 MG tablet Commonly known as: PEPCID Take 20 mg by mouth at bedtime as needed for heartburn or indigestion.   gabapentin 100 MG capsule Commonly known as: NEURONTIN Take 2 capsules (200 mg total) by mouth at bedtime.   insulin glargine 100 UNIT/ML injection Commonly known as: LANTUS Inject 0.2 mLs (20 Units total) into the skin 2 (two) times daily. Lantus 45 units every morning, 40 units evening time, What changed:  how much to take how to take this when to take this Another medication with the same name was removed. Continue taking this medication, and follow the directions you see here.   Insulin Pen Needle 29G X 12MM Misc Use as directed   levothyroxine 125 MCG tablet Commonly known as: SYNTHROID Take 125 mcg by mouth daily before breakfast.   OneTouch Ultra test strip Generic drug: glucose blood 1 each 2 (two) times daily.   pantoprazole 40 MG tablet Commonly known as: PROTONIX Take 40 mg by mouth daily.   tamsulosin 0.4 MG Caps capsule Commonly known as: FLOMAX Take 1 capsule (0.4 mg total) by mouth daily. What changed:  how much to take when to take this   terbinafine 1 % cream Commonly known as: LAMISIL Apply 1 Application topically daily.   triamcinolone ointment 0.1 % Commonly known as: KENALOG Apply 1 Application topically daily as needed (rash).        Allergies  Allergen Reactions   Other Other (See Comments)    Other reaction(s): rash   Tape Rash    Other reaction(s): rash    Consultations: Cardiology   Procedures/Studies: ECHOCARDIOGRAM LIMITED  Result Date: 02/09/2022    ECHOCARDIOGRAM LIMITED  REPORT   Patient Name:   Martin Richardson Date of Exam: 02/09/2022 Medical Rec #:  [id number]         Height:       71.0 in Accession #:    [id number]        Weight:       228.0 lb Date of Birth:  March 26, 1925         BSA:          2.229 m Patient Age:    6 years          BP:           142/65 mmHg Patient Gender: M                 HR:           51 bpm. Exam Location:  Inpatient Procedure: Limited Echo, Cardiac Doppler and Limited Color Doppler Indications:    CVA;  History:        Patient has prior history of Echocardiogram examinations, most                 recent 10/07/2020. Stroke, Signs/Symptoms:Syncope and Chest Pain;                 Risk Factors:Diabetes,  Hypertension, Dyslipidemia and Former                 Smoker.  Sonographer:    Greer Pickerel Referring Phys: [place]  1. Limited study.  2. Left ventricular ejection fraction, by estimation, is 60 to 65%. The left ventricle has normal function. The left ventricle has no regional wall motion abnormalities. There is mild left ventricular hypertrophy. Left ventricular diastolic function could not be evaluated.  3. Right ventricular systolic function is normal. The right ventricular size is normal.  4. The mitral valve is normal in structure. No evidence of mitral valve regurgitation. No evidence of mitral stenosis.  5. The aortic valve is tricuspid. Aortic valve regurgitation is trivial. Aortic valve sclerosis is present, with no evidence of aortic valve stenosis.  6. Aortic dilatation noted. There is mild dilatation of the aortic root, measuring 45 mm. FINDINGS  Left Ventricle: Left ventricular ejection fraction, by estimation, is 60 to 65%. The left ventricle has normal function. The left ventricle has no regional wall motion abnormalities. The left ventricular internal cavity size was normal in size. There is  mild left ventricular hypertrophy. Left ventricular diastolic function could not be evaluated. Right Ventricle: The right  ventricular size is normal. Right ventricular systolic function is normal. Left Atrium: Left atrial size was normal in size. Right Atrium: Right atrial size was normal in size. Pericardium: There is no evidence of pericardial effusion. Mitral Valve: The mitral valve is normal in structure. No evidence of mitral valve stenosis. Tricuspid Valve: The tricuspid valve is normal in structure. Tricuspid valve regurgitation is not demonstrated. No evidence of tricuspid stenosis. Aortic Valve: The aortic valve is tricuspid. Aortic valve regurgitation is trivial. Aortic valve sclerosis is present, with no evidence of aortic valve stenosis. Pulmonic Valve: The pulmonic valve was normal in structure. Aorta: Aortic dilatation noted. There is mild dilatation of the aortic root, measuring 45 mm. Venous: The inferior vena cava was not well visualized. IAS/Shunts: The interatrial septum was not well visualized. Additional Comments: Limited study.  LEFT VENTRICLE PLAX 2D LVOT diam:     1.90 cm LV SV:         74 LV SV Index:   33 LVOT Area:     2.84 cm  LV Volumes (MOD) LV vol d, MOD A4C: 162.0 ml LV vol s, MOD A4C: 66.2 ml LV SV MOD A4C:     162.0 ml AORTIC VALVE LVOT Vmax:   104.00 cm/s LVOT Vmean:  69.800 cm/s LVOT VTI:    0.260 m MITRAL VALVE MV Area (PHT): 2.45 cm    SHUNTS MV Decel Time: 310 msec    Systemic VTI:  0.26 m MV E velocity: 53.80 cm/s  Systemic Diam: 1.90 cm MV A velocity: 66.90 cm/s MV E/A ratio:  0.80 Kirk Ruths MD Electronically signed by Kirk Ruths MD Signature Date/Time: 02/09/2022/10:52:40 AM    Final    CT Head Wo Contrast  Result Date: 02/08/2022 CLINICAL DATA:  Transient ischemic attack (TIA) EXAM: CT HEAD WITHOUT CONTRAST TECHNIQUE: Contiguous axial images were obtained from the base of the skull through the vertex without intravenous contrast. RADIATION DOSE REDUCTION: This exam was performed according to the departmental dose-optimization program which includes automated exposure control,  adjustment of the mA and/or kV according to patient size and/or use of iterative reconstruction technique. COMPARISON:  10/07/2020 FINDINGS: Brain: No evidence of acute infarction, hemorrhage, hydrocephalus, extra-axial collection or mass lesion/mass effect. Patchy low-density changes within the periventricular and  subcortical white matter compatible with chronic microvascular ischemic change. Mild-moderate diffuse cerebral volume loss. Vascular: Atherosclerotic calcifications involving the large vessels of the skull base. No unexpected hyperdense vessel. Skull: Normal. Negative for fracture or focal lesion. Sinuses/Orbits: No acute finding. Other: None. IMPRESSION: 1. No acute intracranial findings. 2. Chronic microvascular ischemic change and cerebral volume loss. Electronically Signed   By: Davina Poke D.O.   On: 02/08/2022 13:38   DG Chest 2 View  Result Date: 02/08/2022 CLINICAL DATA:  89 year old male with sudden onset weakness. Chest pain. EXAM: CHEST - 2 VIEW COMPARISON:  Portable chest 09/26/2021 and earlier. FINDINGS: Semi upright AP and lateral views at 1049 hours. Stable lung volumes compared to last year, more normal on the initial lateral view. Stable cardiac size and mediastinal contours. Cardiac size at the upper limits of normal. Visualized tracheal air column is within normal limits. Chronic left thoracic inlet surgical clips. No pneumothorax, pulmonary edema, pleural effusion or confluent pulmonary opacity. No acute osseous abnormality identified. Negative visible bowel gas. Abdominal Calcified aortic atherosclerosis. IMPRESSION: No acute cardiopulmonary abnormality. Abdominal  Aortic Atherosclerosis (ICD10-I70.0). Electronically Signed   By: Genevie Ann M.D.   On: 02/08/2022 11:03     Subjective: No acute issues/events overnight   Discharge Exam: Vitals:   02/10/22 0606 02/10/22 1107  BP: (!) 161/71 114/66  Pulse: 73 65  Resp: 18 19  Temp: (!) 97.5 F (36.4 C) 97.7 F (36.5 C)   SpO2: 99% 100%   Vitals:   02/09/22 2019 02/10/22 0018 02/10/22 0606 02/10/22 1107  BP: (!) 162/74 (!) 156/86 (!) 161/71 114/66  Pulse: 65 65 73 65  Resp:  '18 18 19  '$ Temp:  (!) 97.5 F (36.4 C) (!) 97.5 F (36.4 C) 97.7 F (36.5 C)  TempSrc:  Oral Oral Oral  SpO2:  96% 99% 100%  Weight:   100 kg   Height:        General: Pt is alert, awake, not in acute distress Cardiovascular: Bradycardic, S1/S2 +, no rubs, no gallops Respiratory: CTA bilaterally, no wheezing, no rhonchi Abdominal: Soft, NT, ND, bowel sounds + Extremities: no edema, no cyanosis    The results of significant diagnostics from this hospitalization (including imaging, microbiology, ancillary and laboratory) are listed below for reference.     Microbiology: No results found for this or any previous visit (from the past 240 hour(s)).   Labs: BNP (last 3 results) Recent Labs    02/08/22 1034  BNP 29.4   Basic Metabolic Panel: Recent Labs  Lab 02/08/22 1027 02/09/22 0240  NA 137 138  K 4.5 4.3  CL 107 109  CO2 22 22  GLUCOSE 181* 106*  BUN 38* 36*  CREATININE 2.40* 2.25*  CALCIUM 9.5 9.5   Liver Function Tests: Recent Labs  Lab 02/08/22 1027  AST 18  ALT 16  ALKPHOS 65  BILITOT 0.8  PROT 5.9*  ALBUMIN 3.3*   No results for input(s): "LIPASE", "AMYLASE" in the last 168 hours. No results for input(s): "AMMONIA" in the last 168 hours. CBC: Recent Labs  Lab 02/08/22 1027 02/09/22 0240  WBC 6.5 6.8  NEUTROABS 3.9  --   HGB 12.4* 12.2*  HCT 35.8* 35.2*  MCV 85.2 84.0  PLT 154 160   Cardiac Enzymes: No results for input(s): "CKTOTAL", "CKMB", "CKMBINDEX", "TROPONINI" in the last 168 hours. BNP: Invalid input(s): "POCBNP" CBG: Recent Labs  Lab 02/09/22 1201 02/09/22 1606 02/09/22 2033 02/10/22 0607 02/10/22 1104  GLUCAP 120* 144* 169* 157*  165*   D-Dimer No results for input(s): "DDIMER" in the last 72 hours. Hgb A1c No results for input(s): "HGBA1C" in the last 72  hours. Lipid Profile No results for input(s): "CHOL", "HDL", "LDLCALC", "TRIG", "CHOLHDL", "LDLDIRECT" in the last 72 hours. Thyroid function studies No results for input(s): "TSH", "T4TOTAL", "T3FREE", "THYROIDAB" in the last 72 hours.  Invalid input(s): "FREET3" Anemia work up No results for input(s): "VITAMINB12", "FOLATE", "FERRITIN", "TIBC", "IRON", "RETICCTPCT" in the last 72 hours. Urinalysis    Component Value Date/Time   COLORURINE YELLOW 08/14/2019 2032   APPEARANCEUR CLEAR 08/14/2019 2032   LABSPEC 1.017 08/14/2019 2032   PHURINE 5.0 08/14/2019 2032   GLUCOSEU >=500 (A) 08/14/2019 2032   HGBUR NEGATIVE 08/14/2019 2032   BILIRUBINUR NEGATIVE 08/14/2019 2032   [place] NEGATIVE 08/14/2019 2032   PROTEINUR 30 (A) 08/14/2019 2032   NITRITE NEGATIVE 08/14/2019 2032   LEUKOCYTESUR NEGATIVE 08/14/2019 2032   Sepsis Labs Recent Labs  Lab 02/08/22 1027 02/09/22 0240  WBC 6.5 6.8   Microbiology No results found for this or any previous visit (from the past 240 hour(s)).   Time coordinating discharge: Over 30 minutes  SIGNED:   Little Ishikawa, DO [facility] 02/10/2022, 11:35 AM Pager   If 7PM-7AM, please contact night-coverage [email or web address]

## 2022-02-10 NOTE — Progress Notes (Unsigned)
[id number] from office inventory applied to patient.   Dr. Johney Frame to read.

## 2022-02-10 NOTE — Plan of Care (Signed)
  Problem: Education: Goal: Ability to describe self-care measures that may prevent or decrease complications (Diabetes Survival Skills Education) will improve Outcome: Adequate for Discharge Goal: Individualized Educational Video(s) Outcome: Adequate for Discharge   Problem: Coping: Goal: Ability to adjust to condition or change in health will improve Outcome: Adequate for Discharge   Problem: Fluid Volume: Goal: Ability to maintain a balanced intake and output will improve Outcome: Adequate for Discharge   Problem: Health Behavior/Discharge Planning: Goal: Ability to identify and utilize available resources and services will improve Outcome: Adequate for Discharge Goal: Ability to manage health-related needs will improve Outcome: Adequate for Discharge   Problem: Metabolic: Goal: Ability to maintain appropriate glucose levels will improve Outcome: Adequate for Discharge   Problem: Nutritional: Goal: Maintenance of adequate nutrition will improve Outcome: Adequate for Discharge Goal: Progress toward achieving an optimal weight will improve Outcome: Adequate for Discharge   Problem: Skin Integrity: Goal: Risk for impaired skin integrity will decrease Outcome: Adequate for Discharge   Problem: Tissue Perfusion: Goal: Adequacy of tissue perfusion will improve Outcome: Adequate for Discharge   Problem: Education: Goal: Knowledge of condition and prescribed therapy will improve Outcome: Adequate for Discharge   Problem: Cardiac: Goal: Will achieve and/or maintain adequate cardiac output Outcome: Adequate for Discharge   Problem: Physical Regulation: Goal: Complications related to the disease process, condition or treatment will be avoided or minimized Outcome: Adequate for Discharge   Problem: Education: Goal: Knowledge of General Education information will improve Description: Including pain rating scale, medication(s)/side effects and non-pharmacologic comfort  measures Outcome: Adequate for Discharge   Problem: Health Behavior/Discharge Planning: Goal: Ability to manage health-related needs will improve Outcome: Adequate for Discharge   Problem: Clinical Measurements: Goal: Ability to maintain clinical measurements within normal limits will improve Outcome: Adequate for Discharge Goal: Will remain free from infection Outcome: Adequate for Discharge Goal: Diagnostic test results will improve Outcome: Adequate for Discharge Goal: Respiratory complications will improve Outcome: Adequate for Discharge Goal: Cardiovascular complication will be avoided Outcome: Adequate for Discharge   Problem: Activity: Goal: Risk for activity intolerance will decrease Outcome: Adequate for Discharge   Problem: Safety: Goal: Ability to remain free from injury will improve Outcome: Adequate for Discharge   Problem: Skin Integrity: Goal: Risk for impaired skin integrity will decrease Outcome: Adequate for Discharge

## 2022-02-10 NOTE — TOC Transition Note (Addendum)
Transition of Care [facility]) - CM/SW Discharge Note   Patient Details  Name: Martin Richardson MRN: [id number] Date of Birth: [birth year]  [facility] CM/SW Contact:  Zenon Mayo, RN Phone Number: 02/10/2022, 1:11 PM   Clinical Narrative:    Patient is for dc today, NCM spoke with daughter , she is on her way to transport him to the Cardiology across the street to get the heart monitor. Daughter states he would prefer to do outpatient physical therapy at [facility] [place].  NCM asked MD to give script to patient because this is not in epic where NCM can make the referral.  MD put in order for ambulatory outpatient physical therapy referral.         Patient Goals and CMS Choice      Discharge Placement                         Discharge Plan and Services Additional resources added to the After Visit Summary for                                       Social Determinants of Health (SDOH) Interventions SDOH Screenings   Food Insecurity: No Food Insecurity (02/09/2022)  Housing: Low Risk  (02/09/2022)  Transportation Needs: No Transportation Needs (02/09/2022)  Utilities: Not At Risk (02/09/2022)  Tobacco Use: Medium Risk (02/08/2022)     Readmission Risk Interventions     No data to display

## 2022-02-16 DIAGNOSIS — G4733 Obstructive sleep apnea (adult) (pediatric): Secondary | ICD-10-CM | POA: Diagnosis not present

## 2022-02-17 DIAGNOSIS — N2581 Secondary hyperparathyroidism of renal origin: Secondary | ICD-10-CM | POA: Diagnosis not present

## 2022-02-17 DIAGNOSIS — D649 Anemia, unspecified: Secondary | ICD-10-CM | POA: Diagnosis not present

## 2022-02-17 DIAGNOSIS — I10 Essential (primary) hypertension: Secondary | ICD-10-CM | POA: Diagnosis not present

## 2022-02-17 DIAGNOSIS — Z09 Encounter for follow-up examination after completed treatment for conditions other than malignant neoplasm: Secondary | ICD-10-CM | POA: Diagnosis not present

## 2022-02-17 DIAGNOSIS — N184 Chronic kidney disease, stage 4 (severe): Secondary | ICD-10-CM | POA: Diagnosis not present

## 2022-02-17 DIAGNOSIS — E1169 Type 2 diabetes mellitus with other specified complication: Secondary | ICD-10-CM | POA: Diagnosis not present

## 2022-02-17 DIAGNOSIS — K5900 Constipation, unspecified: Secondary | ICD-10-CM | POA: Diagnosis not present

## 2022-02-17 DIAGNOSIS — I440 Atrioventricular block, first degree: Secondary | ICD-10-CM | POA: Diagnosis not present

## 2022-02-19 DIAGNOSIS — H401130 Primary open-angle glaucoma, bilateral, stage unspecified: Secondary | ICD-10-CM | POA: Diagnosis not present

## 2022-02-25 ENCOUNTER — Encounter: Payer: Self-pay | Admitting: Podiatry

## 2022-02-25 ENCOUNTER — Ambulatory Visit: Payer: Medicare Other | Admitting: Podiatry

## 2022-02-25 VITALS — BP 132/74

## 2022-02-25 DIAGNOSIS — B351 Tinea unguium: Secondary | ICD-10-CM

## 2022-02-25 DIAGNOSIS — M79676 Pain in unspecified toe(s): Secondary | ICD-10-CM

## 2022-02-25 DIAGNOSIS — L601 Onycholysis: Secondary | ICD-10-CM | POA: Diagnosis not present

## 2022-02-25 DIAGNOSIS — E1142 Type 2 diabetes mellitus with diabetic polyneuropathy: Secondary | ICD-10-CM | POA: Diagnosis not present

## 2022-02-25 NOTE — Progress Notes (Signed)
  Subjective:  Patient ID: Martin Richardson, male    DOB: 02-01-1925,  MRN: [id number]  Martin Richardson presents to clinic today for at risk foot care with history of diabetic neuropathy and painful thick toenails that are difficult to trim. Pain interferes with ambulation. Aggravating factors include wearing enclosed shoe gear. Pain is relieved with periodic professional debridement.  Chief Complaint  Patient presents with   Nail Problem    DFC BS-have not checked today A1C-8.0 Martin Richardson   New problem(s): None, patient is aware of.  Patient unaware of bleeding toenail which is split, right 2nd digit. He is accompanied by his daughter on today's visit.  PCP is Kristen Loader, FNP.  Allergies  Allergen Reactions   Other Other (See Comments)    Other reaction(s): rash   Tape Rash    Other reaction(s): rash    Review of Systems: Negative except as noted in the HPI.  Objective:  Vitals:   02/25/22 1437  BP: 132/74   Martin Richardson is a pleasant 89 y.o. male in NAD. AAO x 3.  Vascular Examination: Palpable pedal pulses b/l LE. Pedal hair absent. No pain with calf compression b/l. Lower extremity skin temperature gradient within normal limits. Trace edema noted BLE. No cyanosis or clubbing noted b/l LE.  Dermatological Examination: Pedal skin thin, shiny and atrophic b/l LE. No interdigital macerations noted b/l LE.   Right 2nd digit with right 2nd digit with longitudinal split toenail. Lateral 1/2 of nailplate sticking up in vertical direction.  Remaining toenails are elongated, discolored, dystrophic, thickened, crumbly with subungual debris and tenderness to dorsal palpation.   No hyperkeratotic nor porokeratotic lesions present on today's visit.  Neurological Examination: Protective sensation decreased with 10 gram monofilament b/l. Vibratory sensation intact b/l.  Musculoskeletal Examination: Muscle strength 5/5 to all lower  extremity muscle groups bilaterally. HAV with bunion deformity noted b/l LE. Patient ambulates with cane assistance.  Assessment/Plan: 1. Pain due to onychomycosis of toenail   2. Onycholysis of toenail   3. Diabetic peripheral neuropathy associated with type 2 diabetes mellitus ([place])   -Consent given for treatment as described below: -Examined patient. -Loose portion of nailplate right 2nd digit debrided. Nailbed cleansed with alcohol. TAO and band-aid applied. -Patient to continue soft, supportive shoe gear daily. -Patient/daughter instructed to apply Neosporin to R 2nd toe once daily. They related understanding. -Patient/POA to call should there be question/concern in the interim.   Return in about 3 months (around 05/27/2022).  Marzetta Board, DPM

## 2022-03-04 DIAGNOSIS — L2089 Other atopic dermatitis: Secondary | ICD-10-CM | POA: Diagnosis not present

## 2022-03-19 DIAGNOSIS — G4733 Obstructive sleep apnea (adult) (pediatric): Secondary | ICD-10-CM | POA: Diagnosis not present

## 2022-03-30 DIAGNOSIS — M549 Dorsalgia, unspecified: Secondary | ICD-10-CM | POA: Diagnosis not present

## 2022-03-30 DIAGNOSIS — R739 Hyperglycemia, unspecified: Secondary | ICD-10-CM | POA: Diagnosis not present

## 2022-03-30 DIAGNOSIS — I440 Atrioventricular block, first degree: Secondary | ICD-10-CM | POA: Diagnosis not present

## 2022-03-30 DIAGNOSIS — R55 Syncope and collapse: Secondary | ICD-10-CM | POA: Diagnosis not present

## 2022-03-30 DIAGNOSIS — I499 Cardiac arrhythmia, unspecified: Secondary | ICD-10-CM | POA: Diagnosis not present

## 2022-03-31 DIAGNOSIS — N184 Chronic kidney disease, stage 4 (severe): Secondary | ICD-10-CM | POA: Diagnosis not present

## 2022-03-31 DIAGNOSIS — N189 Chronic kidney disease, unspecified: Secondary | ICD-10-CM | POA: Diagnosis not present

## 2022-04-04 DIAGNOSIS — E119 Type 2 diabetes mellitus without complications: Secondary | ICD-10-CM | POA: Diagnosis not present

## 2022-04-04 DIAGNOSIS — I10 Essential (primary) hypertension: Secondary | ICD-10-CM | POA: Diagnosis not present

## 2022-04-08 ENCOUNTER — Ambulatory Visit: Payer: Medicare Other | Admitting: Physician Assistant

## 2022-04-08 DIAGNOSIS — R338 Other retention of urine: Secondary | ICD-10-CM | POA: Diagnosis not present

## 2022-04-08 DIAGNOSIS — N184 Chronic kidney disease, stage 4 (severe): Secondary | ICD-10-CM | POA: Diagnosis not present

## 2022-04-08 DIAGNOSIS — N2581 Secondary hyperparathyroidism of renal origin: Secondary | ICD-10-CM | POA: Diagnosis not present

## 2022-04-08 DIAGNOSIS — K5909 Other constipation: Secondary | ICD-10-CM | POA: Diagnosis not present

## 2022-04-08 DIAGNOSIS — I129 Hypertensive chronic kidney disease with stage 1 through stage 4 chronic kidney disease, or unspecified chronic kidney disease: Secondary | ICD-10-CM | POA: Diagnosis not present

## 2022-04-08 DIAGNOSIS — D631 Anemia in chronic kidney disease: Secondary | ICD-10-CM | POA: Diagnosis not present

## 2022-04-10 NOTE — Progress Notes (Signed)
Office Visit    Patient Name: Martin Richardson Date of Encounter: 04/11/2022  PCP:  Kristen Loader, [facility]  Cardiologist:  Freada Bergeron, MD  Advanced Practice Provider:  No care team member to display Electrophysiologist:  None   HPI    Martin Richardson is a 89 y.o. male with a past medical history of CVA, hypertension, hyperlipidemia, type 2 diabetes mellitus, sinus bradycardia, hypothyroidism, CKD stage III-IV, and anemia presents today for follow-up visit.  He was last seen by Dr. [name] 05/2020.  He was doing well at that time without complaint.  According to the patient and his daughter he had an episode where he fell he became lightheaded and his arms became heavy.  He felt as though his heart was racing.  He did develop a little diaphoresis and had lower blood pressure.  Heart rate reported around that time was 115 bpm.  He felt perhaps his blood pressure was low so he ate.  Checked his sugar thereafter and it was 504.  Episode lasted less than 10 minutes.  He was not having any discomfort or issue at the time of the appointment.  Occasionally identifies heart rates over 110 bpm.   The patient presented to the ER 02/08/2022 for weakness and confusion in which he was concerned he was having a recurrent stroke.  In the ED was noted to be in first-degree heart block and bradycardia which was thought to be the primary cause of his symptoms.  Patient was admitted and became profoundly bradycardic at intake.  Remainder of workup was unremarkable.  30-day monitor recommended at discharge.  Patient was otherwise back to his baseline.  Encouraged follow-up with PCP. Looked okay without any significant arrhythmia (2/22/4).  He did have occasional PVCs.   Today, he states when he has his episodes likely linked to constipation.  Usually his heart rate jumps to 133 and then drops to 50s to 60s.  He is on a daily stool softener and we have encouraged  hydration. His nephrologist changed him from miralax to lactulose.  He does not move around too much.  I offered physical therapy to come to his house but he does have the ability to walk around his condo and has to go up and down a flight of steps to get his mail every day.  He has a history of vertigo in the past but has techniques to overcome the symptoms.  Reports no shortness of breath nor dyspnea on exertion. Reports no chest pain, pressure, or tightness. No edema, orthopnea, PND. Reports no palpitations.    Past Medical History    Past Medical History:  Diagnosis Date   Arthritis    Blind left eye    Cancer of left kidney ([place])    Cataract    Chronic kidney disease    Constipation    Diabetes mellitus without complication (HCC)    Glaucoma    Hypertension    Neuropathy    Stroke [facility])    Past Surgical History:  Procedure Laterality Date   CHOLECYSTECTOMY     LEFT HEART CATH AND CORONARY ANGIOGRAPHY N/A 12/16/2019   Procedure: LEFT HEART CATH AND CORONARY ANGIOGRAPHY;  Surgeon: Martinique, Peter M, MD;  Location: [facility];  Service: Cardiovascular;  Laterality: N/A;   ORIF ANKLE FRACTURE Right 08/13/2019   Procedure: OPEN REDUCTION INTERNAL FIXATION (ORIF) ANKLE FRACTURE;  Surgeon: Renette Butters, MD;  Location: [facility];  Service: Orthopedics;  Laterality: Right;   THYROID SURGERY     TONSILLECTOMY      Allergies  Allergies  Allergen Reactions   Other Other (See Comments)    Other reaction(s): rash   Tape Rash    Other reaction(s): rash    EKGs/Labs/Other Studies Reviewed:   The following studies were reviewed today: Cardiac catheterization 12/16/2019: Diagnostic Dominance: Right        Mid LM to Dist LM lesion is 20% stenosed. Mid LAD lesion is 50% stenosed. Dist LAD lesion is 80% stenosed. 2nd Diag lesion is 85% stenosed. Mid Cx lesion is 70% stenosed. LV end diastolic pressure is normal.   1. 2 vessel obstructive CAD without critical  stenosis 2. Normal LVEDP   Plan: recommend medical management.     EKG:  EKG is not ordered today.    Recent Labs: 02/08/2022: ALT 16; B Natriuretic Peptide 87.8 02/09/2022: BUN 36; Creatinine, Ser 2.25; Hemoglobin 12.2; Platelets 160; Potassium 4.3; Sodium 138  Recent Lipid Panel    Component Value Date/Time   CHOL 69 10/08/2020 0431   TRIG 171 (H) 10/08/2020 0431   HDL 22 (L) 10/08/2020 0431   CHOLHDL 3.1 10/08/2020 0431   VLDL 34 10/08/2020 0431   LDLCALC 13 10/08/2020 0431    Home Medications   Current Meds  Medication Sig   acetaminophen (TYLENOL) 325 MG tablet Take 650 mg by mouth every 4 (four) hours as needed for mild pain or headache.   aspirin EC 81 MG EC tablet Take 1 tablet (81 mg total) by mouth daily. Swallow whole.   atorvastatin (LIPITOR) 80 MG tablet Take 80 mg by mouth daily.   BETIMOL 0.5 % ophthalmic solution Apply 1 drop to eye 2 (two) times daily.   cholecalciferol (VITAMIN D3) 25 MCG (1000 UNIT) tablet Take 1,000 Units by mouth daily.   docusate sodium (COLACE) 100 MG capsule Take 100 mg by mouth 2 (two) times daily as needed for mild constipation.   ENULOSE 10 GM/15ML SOLN Take 10 g by mouth as needed.   famotidine (PEPCID) 20 MG tablet Take 20 mg by mouth at bedtime as needed for heartburn or indigestion.   gabapentin (NEURONTIN) 100 MG capsule Take 2 capsules (200 mg total) by mouth at bedtime.   insulin glargine (LANTUS) 100 UNIT/ML injection Inject 0.2 mLs (20 Units total) into the skin 2 (two) times daily. Lantus 45 units every morning, 40 units evening time,   Insulin Pen Needle 29G X 12MM MISC Use as directed   levothyroxine (SYNTHROID) 125 MCG tablet Take 125 mcg by mouth daily before breakfast.   ONETOUCH ULTRA test strip 1 each 2 (two) times daily.   pantoprazole (PROTONIX) 40 MG tablet Take 40 mg by mouth daily.   polyethylene glycol powder (MIRALAX) 17 GM/SCOOP powder 1 scoop mixed with 8 ounces of fluid Orally Once a day for 30 day(s)    Probiotic Product (ALIGN) 4 MG CAPS Take 4 mg by mouth daily.   tamsulosin (FLOMAX) 0.4 MG CAPS capsule Take 1 capsule (0.4 mg total) by mouth daily. (Patient taking differently: Take 0.8 mg by mouth at bedtime.)   terbinafine (LAMISIL) 1 % cream Apply 1 Application topically daily.   triamcinolone ointment (KENALOG) 0.1 % Apply 1 Application topically daily as needed (rash).     Review of Systems      All other systems reviewed and are otherwise negative except as noted above.  Physical Exam    VS:  BP (!) 122/58  Pulse (!) 54   Ht 5\' 11"  (1.803 m)   Wt 232 lb (105.2 kg)   SpO2 96%   BMI 32.36 kg/m  , BMI Body mass index is 32.36 kg/m.  Wt Readings from Last 3 Encounters:  04/11/22 232 lb (105.2 kg)  02/10/22 220 lb 8 oz (100 kg)  12/11/21 231 lb (104.8 kg)     GEN: Well nourished, well developed, in no acute distress. HEENT: normal. Neck: Supple, no JVD, carotid bruits, or masses. Cardiac: RRR, no murmurs, rubs, or gallops. No clubbing, cyanosis, edema.  Radials/PT 2+ and equal bilaterally.  Respiratory:  Respirations regular and unlabored, clear to auscultation bilaterally. GI: Soft, nontender, nondistended. MS: No deformity or atrophy. Skin: Warm and dry, no rash. Neuro:  Strength and sensation are intact. Psych: Normal affect.  Assessment & Plan    Bradycardia/presyncope - has to do with constipation -His nephrologist has switched him from his kidney function -Emphasized exercise and hydration -Sinus bradycardia today rate 54 not on any nodal blocking agents -continue current medications  IDDM 2, uncontrolled -9.3 A1C, recently 8.3, goal < 7 -continue to work with PCP -he used to be in a trial where he was getting his A1c checked Multiple times a year  Hypertension -Blood pressure well-controlled -he is off Norvasc at this time -Continue to monitor blood pressure and heart rate at home  Hyperlipidemia -Recent labs with  PCP -continue  Lipitor  Hypothyroidism -per PCP  Progressive CKD stage IV -Nephrology is following along closely and has changed his MiraLAX to lactulose -Continue close monitoring        Disposition: Follow up 3-4 months with Freada Bergeron, MD or APP.  Signed, Elgie Collard, PA-C 04/11/2022, 12:10 PM [facility]

## 2022-04-11 ENCOUNTER — Ambulatory Visit: Payer: Medicare Other | Attending: Physician Assistant | Admitting: Physician Assistant

## 2022-04-11 ENCOUNTER — Encounter: Payer: Self-pay | Admitting: Physician Assistant

## 2022-04-11 VITALS — BP 122/58 | HR 54 | Ht 71.0 in | Wt 232.0 lb

## 2022-04-11 DIAGNOSIS — N184 Chronic kidney disease, stage 4 (severe): Secondary | ICD-10-CM | POA: Diagnosis not present

## 2022-04-11 DIAGNOSIS — R001 Bradycardia, unspecified: Secondary | ICD-10-CM

## 2022-04-11 DIAGNOSIS — I440 Atrioventricular block, first degree: Secondary | ICD-10-CM | POA: Diagnosis not present

## 2022-04-11 DIAGNOSIS — I2510 Atherosclerotic heart disease of native coronary artery without angina pectoris: Secondary | ICD-10-CM | POA: Diagnosis not present

## 2022-04-11 DIAGNOSIS — I10 Essential (primary) hypertension: Secondary | ICD-10-CM | POA: Diagnosis not present

## 2022-04-11 DIAGNOSIS — R42 Dizziness and giddiness: Secondary | ICD-10-CM

## 2022-04-11 NOTE — Patient Instructions (Signed)
Medication Instructions:  Your physician recommends that you continue on your current medications as directed. Please refer to the Current Medication list given to you today.  *If you need a refill on your cardiac medications before your next appointment, please call your pharmacy*  Lab Work: None If you have labs (blood work) drawn today and your tests are completely normal, you will receive your results only by: [place] (if you have MyChart) OR A paper copy in the mail If you have any lab test that is abnormal or we need to change your treatment, we will call you to review the results.   Follow-Up: At [facility], you and your health needs are our priority.  As part of our continuing mission to provide you with exceptional heart care, we have created designated Provider Care Teams.  These Care Teams include your primary Cardiologist (physician) and Advanced Practice Providers (APPs -  Physician Assistants and Nurse Practitioners) who all work together to provide you with the care you need, when you need it.  Your next appointment:   3-4 month(s) or next available thereafter  Provider:   Freada Bergeron, MD    Other Instructions Check your blood pressure and heart rate daily, one hour after taking your morning medications for 2 weeks, keep a log and send [place] the reading through mychart at the end of the 2 weeks.

## 2022-04-17 DIAGNOSIS — G4733 Obstructive sleep apnea (adult) (pediatric): Secondary | ICD-10-CM | POA: Diagnosis not present

## 2022-04-30 ENCOUNTER — Ambulatory Visit: Payer: Medicare Other | Admitting: Neurology

## 2022-04-30 ENCOUNTER — Encounter: Payer: Self-pay | Admitting: Neurology

## 2022-04-30 VITALS — BP 137/59 | HR 63 | Ht 70.0 in | Wt 230.0 lb

## 2022-04-30 DIAGNOSIS — Z8673 Personal history of transient ischemic attack (TIA), and cerebral infarction without residual deficits: Secondary | ICD-10-CM | POA: Diagnosis not present

## 2022-04-30 DIAGNOSIS — I480 Paroxysmal atrial fibrillation: Secondary | ICD-10-CM

## 2022-04-30 DIAGNOSIS — G4731 Primary central sleep apnea: Secondary | ICD-10-CM

## 2022-04-30 DIAGNOSIS — Z789 Other specified health status: Secondary | ICD-10-CM | POA: Diagnosis not present

## 2022-04-30 DIAGNOSIS — G4739 Other sleep apnea: Secondary | ICD-10-CM

## 2022-04-30 NOTE — Progress Notes (Signed)
Provider:  Larey Seat, MD  Primary Care Physician:  Kristen Loader, [place] [address]     Referring Provider: Kristen Loader, [place],  [place] [address]          Chief Complaint according to patient   Patient presents with:     New Patient (Initial Visit)           HISTORY OF PRESENT ILLNESS:  Martin Richardson is a 89 y.o. male patient who is here for revisit 04/30/2022 for  Insomnia and BiPAP, history of strokes. Accompanied by his daughter.   04-30-2022: Patient state he's not using his CPAP because he couldn't find the right mask.  Chief concern according to patient :  I haven't used PAP since Christmas.  He had trouble with his nasal bridge - I will have him use a chin strap and gave his daughter a picture of a model, and then he could easily use an under the nose dream wear mask. I h Gave him an image of that too. An Evora FFM  medium size, large halo.  He no longer has the sore bridge of the nose, but he is unhappy with using it.    Andersson Schlotter is a 89 y.o. Caucasian male patient seen here as a referral on 10/28/2021 from Stroke Team NP for a RV after several sleep tests- finally BiPAP titration:     Robben Oelschlager is a 89 y.o. Caucasian male patient seen here as a referral on 02/21/2021 from Stroke Team NP  for a sleep apnea evaluation. .  Evaluated by Dr. Brett Fairy 1/26 with prior history of sleep apnea/ He was diagnosed with apnea in [place] and was not having follow up, developed problems with CPAP, FFM left bleeding blisters on outside of the mouth, chin strap, he was frustrated.   It has been more recently that he has had problems with going to sleep and staying asleep. He is staying up till midnight watching acc basketball and some stressful. He stopped about 2019. Initially underwent split-night study on 2/5 but only reported 8.5 minutes of sleep therefore proceeded with HST.  HST by watch pat  completed 3/20 which showed Calculated pAHI (per hour): 69.9/h                           REM pAHI: 53/h      NREM pAHI:   73.4/h                         Auto Pap initiated 3/28.residual AHI was 19.8/h. Initially had difficulty tolerating full face mask, now using hybrid mask with improvement of tolerance.  Reports improvement of sleep quality and some increase energy during the day since starting.  Epworth Sleepiness Scale 20/24 (before start 20/24).  Fatigue severity scale 37/63 (before start 35/63).     HISTORY:  was here recently for attended titration study with new mask fitting. Chinstrap added.    Soloman Dube is a 89 y.o. Caucasian male patient seen here as a referral on 02/21/2021 from Stroke Team NP  for a sleep apnea evaluation. .  Evaluated by Dr. Brett Fairy 1/26 with prior history of sleep apnea/ He was diagnosed with apnea in [place] and was not having follow up, developed problems with CPAP, FFM left bleeding blisters on outside  of the mouth, chin strap, he was frustrated.   It has been more recently that he has had problems with going to sleep and staying asleep. He is staying up till midnight watching acc basketball and some stressful. He stopped about 2019. Initially underwent split-night study on 2/5 but only reported 8.5 minutes of sleep therefore proceeded with HST.later followed b with CPAP and BiPAP.    10-28-2021: RV  The patient is a mean 89 year old cognitively alert gentleman presenting here today with his daughter and has been on BiPAP now since July or June 2023.  He has tried 6 different masks but is unable to meet the compliance of 4 hours of daily use.  He has worked with Social research officer, government.  And what I see is a 76% compliant by days but some days for short of even 2 hours of use.  The average usage of days used is 4 hours and 54 minutes so I need to convince him to use it every day and to at least aim for 4 hours his current BiPAP setting is 21 over 17 cm water pressure and  we had the so far the best results of apnea control central apneas residual at 2.8/h and they are about 2.3/h unknown apneas these are related to air leaks.  The 95th percentile air leak is 59 L/min so this keeps him awake.  He was frustrated with a full facemask in the past and we have tried a chinstrap with nasal pillow he had failed CPAP but or I should say has been failed by CPAP, but has had much better control on BiPAP.  He continues to endorse the Epworth Sleepiness Scale at a very high degree at the last visit to 20/24 points today 17/24 points so at least some decrease in daytime sleepiness seems to be present.  Fatigue severity now is 37 out of 63 points.  This is also decreased in comparison to 40 out of 63 points.       I am ordering gecko skin patch for his nasal bridge, encouraged use in pm at 8 PM and then continue through the night.  He is willing to try and has much less apnea than untreated!              HST by watch pat completed 3/20 which showed: Calculated pAHI (per hour): 69.9/h                           REM pAHI: 53/h                                             NREM pAHI:   73.4/h                         Auto Pap initiated 3/28.residual AHI was 19.8/h. Initially had difficulty tolerating full face mask, now using hybrid mask with improvement of tolerance.  Reports improvement of sleep quality and some increase energy during the day since starting.  Epworth Sleepiness Scale 20/24 (before start 20/24).  Fatigue severity scale 37/63 (before start 35/63).   07-01-2021: CPAP was initiated under a Simplus FFM in medium size, with a chin strap later added.  Beginning at 85 cmH20 with heated humidity per AASM standards, this PAP pressure was advanced to 15 cmH20 because of hypopneas,  apneas, and desaturations. EPR of 3 cm was added and patient was transferred to BiPAP- beginning at 17/13 cm water, titrated up to 22/18 cm water- with the best result seen at   BiPAP pressure of 21/ 17  cmH20, achieving a reduction of the AHI to 3.0 /h over a sleep time of 70 minutes with improvement of sleep apnea.   Lights Out was at 20:47 and Lights On at 04:52. Total recording time (TRT) was 480 minutes, with a total sleep time (TST) of 306.5 minutes. The patient's sleep latency was 48.5 minutes. REM latency was 123.5 minutes.  The sleep efficiency was 63.9 %.       Review of Systems: Out of a complete 14 system review, the patient complains of only the following symptoms, and all other reviewed systems are negative.:  Fatigue, sleepiness , snoring, fragmented sleep, Insomnia,   How likely are you to doze in the following situations: 0 = not likely, 1 = slight chance, 2 = moderate chance, 3 = high chance   Sitting and Reading? Watching Television? Sitting inactive in a public place (theater or meeting)? As a passenger in a car for an hour without a break? Lying down in the afternoon when circumstances permit? Sitting and talking to someone? Sitting quietly after lunch without alcohol? In a car, while stopped for a few minutes in traffic?   Total = 23/ 24 points   FSS endorsed at 43/ 63 points.   GDS 5/ 15.   04-30-2022  Social History   Socioeconomic History   Marital status: Widowed    Spouse name: Not on file   Number of children: Not on file   Years of education: Not on file   Highest education level: Not on file  Occupational History   Occupation: retired  Tobacco Use   Smoking status: Former    Types: Pipe    Quit date: 1983    Years since quitting: 41.2   Smokeless tobacco: Former  Substance and Sexual Activity   Alcohol use: Yes    Comment: social   Drug use: Never   Sexual activity: Not on file  Other Topics Concern   Not on file  Social History Narrative   Not on file   Social Determinants of Health   Financial Resource Strain: Not on file  Food Insecurity: No [place] (02/09/2022)   Hunger Vital Sign    Worried About Running Out of Food in  the Last Year: Never true    [place] in the Last Year: Never true  Transportation Needs: No Transportation Needs (02/09/2022)   PRAPARE - Hydrologist (Medical): No    Lack of Transportation (Non-Medical): No  Physical Activity: Not on file  Stress: Not on file  Social Connections: Not on file    Family History  Problem Relation Age of Onset   Diabetes Mellitus II Mother     Past Medical History:  Diagnosis Date   Arthritis    Blind left eye    Cancer of left kidney ([place])    Cataract    Chronic kidney disease    Constipation    Diabetes mellitus without complication ([place])    Glaucoma    Hypertension    Neuropathy    Stroke [facility])     Past Surgical History:  Procedure Laterality Date   CHOLECYSTECTOMY     LEFT HEART CATH AND CORONARY ANGIOGRAPHY N/A 12/16/2019   Procedure: LEFT HEART CATH AND  CORONARY ANGIOGRAPHY;  Surgeon: Martinique, Peter M, MD;  Location: [facility];  Service: Cardiovascular;  Laterality: N/A;   ORIF ANKLE FRACTURE Right 08/13/2019   Procedure: OPEN REDUCTION INTERNAL FIXATION (ORIF) ANKLE FRACTURE;  Surgeon: Renette Butters, MD;  Location: [facility];  Service: Orthopedics;  Laterality: Right;   THYROID SURGERY     TONSILLECTOMY       Current Outpatient Medications on File Prior to Visit  Medication Sig Dispense Refill   acetaminophen (TYLENOL) 325 MG tablet Take 650 mg by mouth every 4 (four) hours as needed for mild pain or headache.     aspirin EC 81 MG EC tablet Take 1 tablet (81 mg total) by mouth daily. Swallow whole. 30 tablet 11   atorvastatin (LIPITOR) 80 MG tablet Take 80 mg by mouth daily.     BETIMOL 0.5 % ophthalmic solution Apply 1 drop to eye 2 (two) times daily.     cholecalciferol (VITAMIN D3) 25 MCG (1000 UNIT) tablet Take 1,000 Units by mouth daily.     docusate sodium (COLACE) 100 MG capsule Take 100 mg by mouth 2 (two) times daily as needed for mild constipation.     ENULOSE 10 GM/15ML SOLN  Take 10 g by mouth as needed.     famotidine (PEPCID) 20 MG tablet Take 20 mg by mouth at bedtime as needed for heartburn or indigestion.     gabapentin (NEURONTIN) 100 MG capsule Take 2 capsules (200 mg total) by mouth at bedtime. 60 capsule 0   Insulin Pen Needle 29G X 12MM MISC Use as directed 200 each 0   levothyroxine (SYNTHROID) 125 MCG tablet Take 125 mcg by mouth daily before breakfast.     ONETOUCH ULTRA test strip 1 each 2 (two) times daily.     pantoprazole (PROTONIX) 40 MG tablet Take 40 mg by mouth daily.     polyethylene glycol powder (MIRALAX) 17 GM/SCOOP powder 1 scoop mixed with 8 ounces of fluid Orally Once a day for 30 day(s)     Probiotic Product (ALIGN) 4 MG CAPS Take 4 mg by mouth daily.     tamsulosin (FLOMAX) 0.4 MG CAPS capsule Take 1 capsule (0.4 mg total) by mouth daily. (Patient taking differently: Take 0.8 mg by mouth at bedtime.) 30 capsule    terbinafine (LAMISIL) 1 % cream Apply 1 Application topically daily.     triamcinolone ointment (KENALOG) 0.1 % Apply 1 Application topically daily as needed (rash).     amLODipine (NORVASC) 5 MG tablet Take 5 mg by mouth daily as needed (SBP> 140 or DBP> 80). (Patient not taking: Reported on 04/11/2022)     insulin glargine (LANTUS) 100 UNIT/ML injection Inject 0.2 mLs (20 Units total) into the skin 2 (two) times daily. Lantus 45 units every morning, 40 units evening time, 20 mL 0   No current facility-administered medications on file prior to visit.    Allergies  Allergen Reactions   Other Other (See Comments)    Other reaction(s): rash   Tape Rash    Other reaction(s): rash     DIAGNOSTIC DATA (LABS, IMAGING, TESTING) - I reviewed patient records, labs, notes, testing and imaging myself where available.  Lab Results  Component Value Date   WBC 6.8 02/09/2022   HGB 12.2 (L) 02/09/2022   HCT 35.2 (L) 02/09/2022   MCV 84.0 02/09/2022   PLT 160 02/09/2022      Component Value Date/Time   NA 138 02/09/2022 0240    K  4.3 02/09/2022 0240   CL 109 02/09/2022 0240   CO2 22 02/09/2022 0240   GLUCOSE 106 (H) 02/09/2022 0240   BUN 36 (H) 02/09/2022 0240   CREATININE 2.25 (H) 02/09/2022 0240   CALCIUM 9.5 02/09/2022 0240   PROT 5.9 (L) 02/08/2022 1027   ALBUMIN 3.3 (L) 02/08/2022 1027   AST 18 02/08/2022 1027   ALT 16 02/08/2022 1027   ALKPHOS 65 02/08/2022 1027   BILITOT 0.8 02/08/2022 1027   GFRNONAA 26 (L) 02/09/2022 0240   GFRAA 36 (L) 08/22/2019 0456   Lab Results  Component Value Date   CHOL 69 10/08/2020   HDL 22 (L) 10/08/2020   LDLCALC 13 10/08/2020   TRIG 171 (H) 10/08/2020   CHOLHDL 3.1 10/08/2020   Lab Results  Component Value Date   HGBA1C 9.3 (H) 10/08/2020   No results found for: "VITAMINB12" Lab Results  Component Value Date   TSH 1.450 12/15/2019    PHYSICAL EXAM:  Today's Vitals   04/30/22 1301  BP: (!) 137/59  Pulse: 63  Weight: 230 lb (104.3 kg)  Height: 5\' 10"  (1.778 m)   Body mass index is 33 kg/m.   Wt Readings from Last 3 Encounters:  04/30/22 230 lb (104.3 kg)  04/11/22 232 lb (105.2 kg)  02/10/22 220 lb 8 oz (100 kg)     Ht Readings from Last 3 Encounters:  04/30/22 5\' 10"  (1.778 m)  04/11/22 5\' 11"  (1.803 m)  02/09/22 5\' 11"  (1.803 m)      General: The patient is awake, alert and appears not in acute distress. The patient is well groomed. Head: Normocephalic, atraumatic. Neck is supple. Mallampati 3,  neck circumference:16.5 inches . Nasal airflow  patent.  Retrognathia is not seen.  Dental status: BIOLOGICAL- loud snoring.  Cardiovascular:  Regular rate and cardiac rhythm by pulse,  without distended neck veins. Respiratory: Lungs are clear to auscultation.  Skin:  Without evidence of ankle edema, or rash. Trunk: The patient's posture is erect.   Neurologic exam : The patient is awake and alert, oriented to place and time.   Memory subjective described as intact.  Attention span & concentration ability appears normal.  Speech is  fluent,  without  dysarthria, dysphonia or aphasia.  Mood and affect are appropriate.   Cranial nerves: no loss of smell or taste reported  Pupils are equal and briskly reactive to light. Funduscopic exam deferred. Left eye  blindness, lazy eye since birth . Cataract in the left present, removed in the right.  Extraocular movements in vertical and horizontal planes were intact and without nystagmus. No Diplopia. Visual fields monocular  Hearing was intact to soft voice and finger rubbing.    Facial sensation intact to fine touch.  Facial motor strength is symmetric and tongue and uvula move midline.  Neck ROM : rotation, tilt and flexion extension were normal for age and shoulder shrug was symmetrical.    Motor exam:  Symmetric bulk, tone and ROM.   Normal tone without cog- wheeling, symmetric grip strength .   Sensory:  Fine touch and vibration were normal in upper extremities, feet numbness, fingertips numbness. .  Proprioception tested in the upper extremities was normal.   Coordination: Rapid alternating movements in the fingers/hands were of normal speed.  Deferred.    Gait and station: Patient could rise unassisted from a seated position, walked without assistive device.  Stance is of normal width/ base and the patient turned with 4 steps.   ASSESSMENT AND  PLAN 89 y.o. year old STROKE patient of Dr Clydene Fake with recurrent strokes, Caucasian male  here with: Spell on March 03-2022, atrial fib related.    1)  Insomnia , but that's highly dependent on what he does for the evening . He doesn't fall asleep watching basketball  on TV late at night and sometimes can't go to sleep for an hour after the game stopped.   2) BIPAP - here again , he lacks commitment, I gave him a F and P  EVORA FFM and to be used with a chin strap , we needed a 12 minute part of the visit for fitting.   3) last Data from 11/ 23 - BIPAP reviewed.  21/ 17 cm water BiPAP.  The AHI was down from 79/h to 6.4/h.    His tachybradycardia was last evident in early march when he had a spell, syncope- atrial fib.    He will follow up  through our Stroke service or NP Amy Lomax, NP,  within 12 months or earlier if needed.   I would like to thank Kristen Loader, FNP and Kristen Loader, [address] [place],  [place] [address] for allowing me to meet with and to take care of this pleasant patient.   After spending a total time of  30  minutes face to face and additional time for physical and neurologic examination, review of laboratory studies,  personal review of imaging studies, reports and results of other testing and review of referral information / records as far as provided in visit,   Electronically signed by: Larey Seat, MD 04/30/2022 1:15 PM  [facility] [place] certified by The [employer] of Sleep Medicine and Diplomate of the [employer] of Sleep Medicine. Board certified In Neurology through the [place], Fellow of the [employer] of Neurology. Medical Director of [employer].

## 2022-04-30 NOTE — Patient Instructions (Signed)
  ASSESSMENT AND PLAN 89 y.o. year old STROKE patient of Dr Clydene Fake with recurrent strokes, Caucasian male  here with: Spell on March 03-2022, atrial fib related.    1)  Insomnia , but that's highly dependent on what he does for the evening . He doesn't fall asleep watching basketball  on TV late at night and sometimes can't go to sleep for an hour after the game stopped.   2) BIPAP - here again , he lacks commitment, I gave him a F and P  EVORA FFM and to be used with a chin strap , we needed a 12 minute part of the visit for fitting.   3) last Data from 11/ 23 - BIPAP reviewed.  21/ 17 cm water BiPAP.  The AHI was down from 79/h to 6.4/h.   His tachybradycardia was last evident in early march when he had a spell, syncope- atrial fib.    He will follow up  through our Stroke service or NP Clemmie Krill Cue, NP, Debbora Presto, NP,  within 12 months or earlier if needed.

## 2022-05-01 ENCOUNTER — Ambulatory Visit: Payer: Medicare Other | Admitting: Neurology

## 2022-05-06 ENCOUNTER — Ambulatory Visit: Payer: Medicare Other | Admitting: Adult Health

## 2022-05-07 DIAGNOSIS — E785 Hyperlipidemia, unspecified: Secondary | ICD-10-CM | POA: Diagnosis not present

## 2022-05-07 DIAGNOSIS — I10 Essential (primary) hypertension: Secondary | ICD-10-CM | POA: Diagnosis not present

## 2022-05-07 DIAGNOSIS — N184 Chronic kidney disease, stage 4 (severe): Secondary | ICD-10-CM | POA: Diagnosis not present

## 2022-05-07 DIAGNOSIS — E1169 Type 2 diabetes mellitus with other specified complication: Secondary | ICD-10-CM | POA: Diagnosis not present

## 2022-05-18 DIAGNOSIS — G4733 Obstructive sleep apnea (adult) (pediatric): Secondary | ICD-10-CM | POA: Diagnosis not present

## 2022-05-26 ENCOUNTER — Encounter: Payer: Self-pay | Admitting: Neurology

## 2022-05-26 ENCOUNTER — Ambulatory Visit: Payer: Medicare Other | Admitting: Neurology

## 2022-06-05 DIAGNOSIS — H401131 Primary open-angle glaucoma, bilateral, mild stage: Secondary | ICD-10-CM | POA: Diagnosis not present

## 2022-06-05 DIAGNOSIS — E119 Type 2 diabetes mellitus without complications: Secondary | ICD-10-CM | POA: Diagnosis not present

## 2022-06-05 DIAGNOSIS — H04123 Dry eye syndrome of bilateral lacrimal glands: Secondary | ICD-10-CM | POA: Diagnosis not present

## 2022-06-05 DIAGNOSIS — Z961 Presence of intraocular lens: Secondary | ICD-10-CM | POA: Diagnosis not present

## 2022-06-05 DIAGNOSIS — Z794 Long term (current) use of insulin: Secondary | ICD-10-CM | POA: Diagnosis not present

## 2022-06-10 ENCOUNTER — Ambulatory Visit: Payer: Medicare Other | Admitting: Podiatry

## 2022-06-11 ENCOUNTER — Ambulatory Visit: Payer: Medicare Other | Admitting: Neurology

## 2022-07-02 DIAGNOSIS — M6281 Muscle weakness (generalized): Secondary | ICD-10-CM | POA: Diagnosis not present

## 2022-07-02 DIAGNOSIS — R5381 Other malaise: Secondary | ICD-10-CM | POA: Diagnosis not present

## 2022-07-02 DIAGNOSIS — R293 Abnormal posture: Secondary | ICD-10-CM | POA: Diagnosis not present

## 2022-07-02 DIAGNOSIS — R2681 Unsteadiness on feet: Secondary | ICD-10-CM | POA: Diagnosis not present

## 2022-07-08 ENCOUNTER — Telehealth: Payer: Self-pay

## 2022-07-08 DIAGNOSIS — E039 Hypothyroidism, unspecified: Secondary | ICD-10-CM | POA: Diagnosis not present

## 2022-07-08 DIAGNOSIS — E1122 Type 2 diabetes mellitus with diabetic chronic kidney disease: Secondary | ICD-10-CM | POA: Diagnosis not present

## 2022-07-08 DIAGNOSIS — E785 Hyperlipidemia, unspecified: Secondary | ICD-10-CM | POA: Diagnosis not present

## 2022-07-08 DIAGNOSIS — E1165 Type 2 diabetes mellitus with hyperglycemia: Secondary | ICD-10-CM | POA: Diagnosis not present

## 2022-07-08 DIAGNOSIS — G629 Polyneuropathy, unspecified: Secondary | ICD-10-CM | POA: Diagnosis not present

## 2022-07-08 DIAGNOSIS — I2510 Atherosclerotic heart disease of native coronary artery without angina pectoris: Secondary | ICD-10-CM | POA: Diagnosis not present

## 2022-07-08 DIAGNOSIS — D649 Anemia, unspecified: Secondary | ICD-10-CM | POA: Diagnosis not present

## 2022-07-08 DIAGNOSIS — Z794 Long term (current) use of insulin: Secondary | ICD-10-CM | POA: Diagnosis not present

## 2022-07-08 DIAGNOSIS — N184 Chronic kidney disease, stage 4 (severe): Secondary | ICD-10-CM | POA: Diagnosis not present

## 2022-07-08 DIAGNOSIS — I10 Essential (primary) hypertension: Secondary | ICD-10-CM | POA: Diagnosis not present

## 2022-07-08 DIAGNOSIS — I480 Paroxysmal atrial fibrillation: Secondary | ICD-10-CM | POA: Diagnosis not present

## 2022-07-08 DIAGNOSIS — E1142 Type 2 diabetes mellitus with diabetic polyneuropathy: Secondary | ICD-10-CM | POA: Diagnosis not present

## 2022-07-08 NOTE — Telephone Encounter (Signed)
-----   Message from Melvyn Novas, MD sent at 07/07/2022  8:49 AM EDT ----- Poor compliance , AHI is 6.8/h and better than expected.

## 2022-07-08 NOTE — Telephone Encounter (Signed)
I spoke with the patient's daughter, Bosie Clos (as per [facility]) and provided results. She states her dad has not been using his CPAP since around Christmas. He seems to be doing well overall.  Most of our conversation consisted of a discussion regarding the letter the patient received for the appointment date of 05/26/22. She states she spoke with someone, who was very kind and helpful prior to that appointment about cancelling. She believes in error the appointment was not cancelled. She is requesting that the no show be removed from the patient's record.

## 2022-07-09 DIAGNOSIS — R293 Abnormal posture: Secondary | ICD-10-CM | POA: Diagnosis not present

## 2022-07-09 DIAGNOSIS — R2681 Unsteadiness on feet: Secondary | ICD-10-CM | POA: Diagnosis not present

## 2022-07-09 DIAGNOSIS — R5381 Other malaise: Secondary | ICD-10-CM | POA: Diagnosis not present

## 2022-07-09 DIAGNOSIS — M6281 Muscle weakness (generalized): Secondary | ICD-10-CM | POA: Diagnosis not present

## 2022-07-11 DIAGNOSIS — R5381 Other malaise: Secondary | ICD-10-CM | POA: Diagnosis not present

## 2022-07-11 DIAGNOSIS — R2681 Unsteadiness on feet: Secondary | ICD-10-CM | POA: Diagnosis not present

## 2022-07-11 DIAGNOSIS — R293 Abnormal posture: Secondary | ICD-10-CM | POA: Diagnosis not present

## 2022-07-11 DIAGNOSIS — M6281 Muscle weakness (generalized): Secondary | ICD-10-CM | POA: Diagnosis not present

## 2022-07-21 DIAGNOSIS — R293 Abnormal posture: Secondary | ICD-10-CM | POA: Diagnosis not present

## 2022-07-21 DIAGNOSIS — R5381 Other malaise: Secondary | ICD-10-CM | POA: Diagnosis not present

## 2022-07-21 DIAGNOSIS — M6281 Muscle weakness (generalized): Secondary | ICD-10-CM | POA: Diagnosis not present

## 2022-07-21 DIAGNOSIS — R2681 Unsteadiness on feet: Secondary | ICD-10-CM | POA: Diagnosis not present

## 2022-07-23 DIAGNOSIS — M6281 Muscle weakness (generalized): Secondary | ICD-10-CM | POA: Diagnosis not present

## 2022-07-23 DIAGNOSIS — R293 Abnormal posture: Secondary | ICD-10-CM | POA: Diagnosis not present

## 2022-07-23 DIAGNOSIS — R5381 Other malaise: Secondary | ICD-10-CM | POA: Diagnosis not present

## 2022-07-23 DIAGNOSIS — R2681 Unsteadiness on feet: Secondary | ICD-10-CM | POA: Diagnosis not present

## 2022-07-25 DIAGNOSIS — R2681 Unsteadiness on feet: Secondary | ICD-10-CM | POA: Diagnosis not present

## 2022-07-25 DIAGNOSIS — R293 Abnormal posture: Secondary | ICD-10-CM | POA: Diagnosis not present

## 2022-07-25 DIAGNOSIS — M6281 Muscle weakness (generalized): Secondary | ICD-10-CM | POA: Diagnosis not present

## 2022-07-25 DIAGNOSIS — R5381 Other malaise: Secondary | ICD-10-CM | POA: Diagnosis not present

## 2022-07-28 DIAGNOSIS — R5381 Other malaise: Secondary | ICD-10-CM | POA: Diagnosis not present

## 2022-07-28 DIAGNOSIS — R2681 Unsteadiness on feet: Secondary | ICD-10-CM | POA: Diagnosis not present

## 2022-07-28 DIAGNOSIS — R293 Abnormal posture: Secondary | ICD-10-CM | POA: Diagnosis not present

## 2022-07-28 DIAGNOSIS — M6281 Muscle weakness (generalized): Secondary | ICD-10-CM | POA: Diagnosis not present

## 2022-07-30 DIAGNOSIS — R5381 Other malaise: Secondary | ICD-10-CM | POA: Diagnosis not present

## 2022-07-30 DIAGNOSIS — R293 Abnormal posture: Secondary | ICD-10-CM | POA: Diagnosis not present

## 2022-07-30 DIAGNOSIS — R2681 Unsteadiness on feet: Secondary | ICD-10-CM | POA: Diagnosis not present

## 2022-07-30 DIAGNOSIS — M6281 Muscle weakness (generalized): Secondary | ICD-10-CM | POA: Diagnosis not present

## 2022-08-01 NOTE — Progress Notes (Unsigned)
Cardiology Office Note:   Date:  08/04/2022  ID:  Martin Richardson, DOB 12-11-1925, MRN [id number]  History of Present Illness:   Martin Richardson is a 89 y.o. male with history of CVA, hypertension, hyperlipidemia, type 2 diabetes mellitus, sinus bradycardia, hypothyroidism, CKD stage III-IV, and anemia presents today for follow-up visit.   Patient presented to ER in 01/2022 with episode of sudden onset weakness, bilateral shoulder/neck pain and presyncope. Trop flat in 50s, BNP not elevated. Cr at baseline. ECG with NSR with profound first degree AVB (although this is chronic). CT head unremarkable. Has had similar episodes in the past where cardiac monitor revealed SB-NSR with average HR 62 with first degree AVB, no pauses and no Afib. 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 Overall, there is concern that his episode was related to bradycardic event in the setting of known significant first degree AVB and bradycardia. May also have some vagal element as patient did report urge to have BM prior to the event (episodes have occurred in the past without that sensation, however). Was recommended for 30day monitor which showed NSR, first degree AVB, no pauses or arrhythmias, rare ectopy.  Was last seen in clinic by Jari Favre in 03/2022 where he was doing well. Symptoms seemed to correlate with constipation and improved with initiation of stool softners.  Today, the patient overall feels okay. States he has not had any presyncopal spells since 01/2022. Reports that these episodes correlate with constipation but if he is more regular, he has less issues. Otherwise, doing well from a CV standpoint. No chest pain, SOB, palpitations, lightheadedness or dizziness.  Continues to live independently and ambulates with a cane. Maintains a very active  lifestyle.     Past Medical History:  Diagnosis Date   Arthritis    Blind left eye    Cancer of left kidney (HCC)    Cataract    Chronic kidney disease    Constipation    Diabetes mellitus without complication (HCC)    Glaucoma    Hypertension    Neuropathy    Stroke (HCC)      ROS: As per HPI  Studies Reviewed:    EKG:  No new tracing  Cardiac Studies & Procedures   CARDIAC CATHETERIZATION  CARDIAC CATHETERIZATION 12/16/2019  Narrative  Mid LM to Dist LM lesion is 20% stenosed.  Mid LAD lesion is 50% stenosed.  Dist LAD lesion is 80% stenosed.  2nd Diag lesion is 85% stenosed.  Mid Cx lesion is 70% stenosed.  LV end diastolic pressure is normal.  1. 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 Normal LVEDP  Plan: recommend medical management.  Findings Coronary Findings Diagnostic  Dominance: Right  Left Main Mid LM to Dist LM lesion is 20% stenosed.  Left Anterior Descending Mid LAD lesion is 50% stenosed. Dist LAD lesion is 80% stenosed.  Second Diagonal Branch 2nd Diag lesion is 85% stenosed.  Left Circumflex Mid Cx lesion is 70% stenosed.  Second Obtuse Marginal Branch Vessel is small in size.  Right Coronary Artery Vessel was injected. Vessel is large. The vessel exhibits minimal luminal irregularities.  Intervention  No interventions have been documented.     ECHOCARDIOGRAM  ECHOCARDIOGRAM LIMITED 02/09/2022  Narrative ECHOCARDIOGRAM LIMITED REPORT    Patient Name:   Martin Richardson Date of Exam: 02/09/2022 Medical Rec #:  [id number]         Height:       71.0 in Accession #:    [id number]        Weight:       228.0 lb Date of Birth:  December 02, 1925         BSA:          2.229 m Patient Age:    89 years          BP:           142/65 mmHg Patient Gender: M                 HR:           51 bpm. Exam Location:  Inpatient  Procedure: Limited Echo, Cardiac Doppler and Limited Color Doppler  Indications:     CVA;  History:        Patient has prior history of Echocardiogram examinations, most recent 10/07/2020. Stroke, Signs/Symptoms:Syncope and Chest Pain; Risk Factors:Diabetes, Hypertension, Dyslipidemia and Former Smoker.  Sonographer:    Aron Baba Referring Phys: [id number] JENNIFER YATES  IMPRESSIONS   1. Limited study. 2. Left ventricular ejection fraction, by estimation, is 60 to 65%. The left ventricle has normal function. The left ventricle has no regional wall motion abnormalities. There is mild left ventricular hypertrophy. Left ventricular diastolic function could not be evaluated. 3. Right ventricular systolic function is normal. The right ventricular size is normal. 4. The mitral valve is normal in structure. No evidence of mitral valve regurgitation. No evidence of mitral stenosis. 5. The aortic valve is tricuspid. Aortic valve regurgitation is trivial. Aortic valve sclerosis is present, with no evidence of aortic valve stenosis. 6. Aortic dilatation noted. There is mild dilatation of the aortic root, measuring 45 mm.  FINDINGS Left Ventricle: Left ventricular ejection fraction, by estimation, is 60 to 65%. The left ventricle has normal function. The left ventricle has no regional wall motion abnormalities. The left ventricular internal cavity size was normal in size. There is mild left ventricular hypertrophy. Left ventricular diastolic function could not be evaluated.  Right Ventricle: The right ventricular size is normal. Right ventricular systolic function is normal.  Left Atrium: Left atrial size was normal in size.  Right Atrium: Right atrial size was normal in size.  Pericardium: There is no evidence of pericardial effusion.  Mitral Valve: The mitral valve is normal in structure. No evidence of mitral valve stenosis.  Tricuspid Valve: The tricuspid valve is normal in structure. Tricuspid valve regurgitation is not demonstrated. No evidence of tricuspid  stenosis.  Aortic Valve: The aortic valve is tricuspid. Aortic valve regurgitation is trivial. Aortic valve sclerosis is present, with no evidence of aortic valve stenosis.  Pulmonic Valve: The pulmonic valve was normal in structure.  Aorta: Aortic dilatation noted. There is mild dilatation of the aortic root, measuring 45 mm.  Venous: The inferior vena cava was not well visualized.  IAS/Shunts: The interatrial septum was not well visualized.  Additional Comments: Limited study.  LEFT VENTRICLE PLAX 2D LVOT diam:     1.90 cm LV SV:         74 LV SV Index:   33 LVOT Area:     2.84 cm  LV Volumes (MOD) LV vol d, MOD A4C: 162.0 ml LV vol s, MOD A4C: 66.2 ml LV SV MOD A4C:     162.0 ml  AORTIC VALVE LVOT Vmax:   104.00 cm/s LVOT Vmean:  69.800 cm/s LVOT VTI:  0.260 m  MITRAL VALVE MV Area (PHT): 2.45 cm    SHUNTS MV Decel Time: 310 msec    Systemic VTI:  0.26 m MV E velocity: 53.80 cm/s  Systemic Diam: 1.90 cm MV A velocity: 66.90 cm/s MV E/A ratio:  0.80  Olga Millers MD Electronically signed by Olga Millers MD Signature Date/Time: 02/09/2022/10:52:40 AM    Final    MONITORS  CARDIAC EVENT MONITOR 03/20/2022  Narrative   Patient monitoring period was from 02/10/22-03/11/22   Predominant rhythm was NSR with first degree AVB with average HR 62bpm   There were occasional PVCs (1%), rare SVE (<1%)   No Afib, sustained arrhythmias or signifiant pauses  Laurance Flatten, MD            Risk Assessment/Calculations:              Physical Exam:   VS:  BP 118/76   Pulse 60   Ht 5' 11.5" (1.816 m)   Wt 231 lb 6.4 oz (105 kg)   SpO2 99%   BMI 31.82 kg/m    Wt Readings from Last 3 Encounters:  08/04/22 231 lb 6.4 oz (105 kg)  04/30/22 230 lb (104.3 kg)  04/11/22 232 lb (105.2 kg)     GEN: Well nourished, well developed in no acute distress NECK: No JVD; No carotid bruits CARDIAC: RRR, no murmurs, rubs, gallops RESPIRATORY:  Clear to  auscultation without rales, wheezing or rhonchi  ABDOMEN: Soft, non-tender, non-distended EXTREMITIES:  Trace edema, warm  ASSESSMENT AND PLAN:   #Bradycardia: #Presyncope: #First Degree AVB: -Patient with episodes of bradycardia/presyncope but seems to relate to constipation suggesting vagal element rather than high degree AVB -Cardiac monitor reassuring with no high degree block, pauses or arrhythmias -Currently doing well without presyncopal episodes -Not on nodal agents -Will continue wot monitor  #HTN: -Well controlled -Continue amlodipine 5mg  daily  #HLD: -Continue lipitor 80mg    #CKD IV: -Follows with Nephrology -Continue lipitor  #OSA: -On CPAP        Signed, Meriam Sprague, MD

## 2022-08-04 ENCOUNTER — Encounter: Payer: Self-pay | Admitting: Cardiology

## 2022-08-04 ENCOUNTER — Ambulatory Visit: Payer: Medicare Other | Attending: Cardiology | Admitting: Cardiology

## 2022-08-04 VITALS — BP 118/76 | HR 60 | Ht 71.5 in | Wt 231.4 lb

## 2022-08-04 DIAGNOSIS — I2510 Atherosclerotic heart disease of native coronary artery without angina pectoris: Secondary | ICD-10-CM

## 2022-08-04 DIAGNOSIS — I440 Atrioventricular block, first degree: Secondary | ICD-10-CM | POA: Diagnosis not present

## 2022-08-04 DIAGNOSIS — I10 Essential (primary) hypertension: Secondary | ICD-10-CM | POA: Diagnosis not present

## 2022-08-04 DIAGNOSIS — R001 Bradycardia, unspecified: Secondary | ICD-10-CM

## 2022-08-04 DIAGNOSIS — N184 Chronic kidney disease, stage 4 (severe): Secondary | ICD-10-CM

## 2022-08-04 NOTE — Patient Instructions (Signed)
Medication Instructions:   Your physician recommends that you continue on your current medications as directed. Please refer to the Current Medication list given to you today.  *If you need a refill on your cardiac medications before your next appointment, please call your pharmacy*    Follow-Up: At [facility], you and your health needs are our priority.  As part of our continuing mission to provide you with exceptional heart care, we have created designated Provider Care Teams.  These Care Teams include your primary Cardiologist (physician) and Advanced Practice Providers (APPs -  Physician Assistants and Nurse Practitioners) who all work together to provide you with the care you need, when you need it.  We recommend signing up for the patient portal called "MyChart".  Sign up information is provided on this After Visit Summary.  MyChart is used to connect with patients for Virtual Visits (Telemedicine).  Patients are able to view lab/test results, encounter notes, upcoming appointments, etc.  Non-urgent messages can be sent to your provider as well.   To learn more about what you can do with MyChart, go to [email or web address].    Your next appointment:   6 month(s)  Provider:   Dr. Mark Skains   

## 2022-08-11 DIAGNOSIS — R2681 Unsteadiness on feet: Secondary | ICD-10-CM | POA: Diagnosis not present

## 2022-08-11 DIAGNOSIS — M6281 Muscle weakness (generalized): Secondary | ICD-10-CM | POA: Diagnosis not present

## 2022-08-11 DIAGNOSIS — R293 Abnormal posture: Secondary | ICD-10-CM | POA: Diagnosis not present

## 2022-08-11 DIAGNOSIS — R5381 Other malaise: Secondary | ICD-10-CM | POA: Diagnosis not present

## 2022-08-13 DIAGNOSIS — R2681 Unsteadiness on feet: Secondary | ICD-10-CM | POA: Diagnosis not present

## 2022-08-13 DIAGNOSIS — M6281 Muscle weakness (generalized): Secondary | ICD-10-CM | POA: Diagnosis not present

## 2022-08-13 DIAGNOSIS — R293 Abnormal posture: Secondary | ICD-10-CM | POA: Diagnosis not present

## 2022-08-13 DIAGNOSIS — R5381 Other malaise: Secondary | ICD-10-CM | POA: Diagnosis not present

## 2022-08-15 DIAGNOSIS — R2681 Unsteadiness on feet: Secondary | ICD-10-CM | POA: Diagnosis not present

## 2022-08-15 DIAGNOSIS — R293 Abnormal posture: Secondary | ICD-10-CM | POA: Diagnosis not present

## 2022-08-15 DIAGNOSIS — M6281 Muscle weakness (generalized): Secondary | ICD-10-CM | POA: Diagnosis not present

## 2022-08-15 DIAGNOSIS — R5381 Other malaise: Secondary | ICD-10-CM | POA: Diagnosis not present

## 2022-08-18 DIAGNOSIS — R293 Abnormal posture: Secondary | ICD-10-CM | POA: Diagnosis not present

## 2022-08-18 DIAGNOSIS — R5381 Other malaise: Secondary | ICD-10-CM | POA: Diagnosis not present

## 2022-08-18 DIAGNOSIS — M6281 Muscle weakness (generalized): Secondary | ICD-10-CM | POA: Diagnosis not present

## 2022-08-18 DIAGNOSIS — R2681 Unsteadiness on feet: Secondary | ICD-10-CM | POA: Diagnosis not present

## 2022-08-20 ENCOUNTER — Ambulatory Visit: Payer: Medicare Other | Admitting: Neurology

## 2022-08-20 ENCOUNTER — Encounter: Payer: Self-pay | Admitting: Neurology

## 2022-08-20 VITALS — BP 139/66 | HR 76 | Ht 71.0 in | Wt 231.0 lb

## 2022-08-20 DIAGNOSIS — G4733 Obstructive sleep apnea (adult) (pediatric): Secondary | ICD-10-CM | POA: Diagnosis not present

## 2022-08-20 DIAGNOSIS — R269 Unspecified abnormalities of gait and mobility: Secondary | ICD-10-CM

## 2022-08-20 DIAGNOSIS — Z8673 Personal history of transient ischemic attack (TIA), and cerebral infarction without residual deficits: Secondary | ICD-10-CM

## 2022-08-20 NOTE — Progress Notes (Signed)
[facility] [address] [place]. [place] [address] [phone]       OFFICE FOLLOW UP NOTE  [name] Date of Birth:  07-May-1925 Medical Record Number:  [id number]    Primary neurologist: Dr. Pearlean Brownie Sleep neurologist: Dr. Vickey Richardson Reason for Referral: stroke follow-up    SUBJECTIVE:   CHIEF COMPLAINT:  Chief Complaint  Patient presents with   Follow-up    Rm 20, here with daughter Martin Richardson Pt is here for hospital follow up. States he has lightheadedness almost everyday. Pt  states he is having some imbalance/weakness. Still ding PT.  Daughter states he is more sedentary, states he uses a cane to get around.     HPI:  Update 08/20/2022 : She returns for follow-up today after last visit 6 months ago with Martin Richardson nurse practitioner.  Patient had 2 brief episodes in January as well as a month later 1 for 1 of which which went to the emergency room and for the second the EMS were called but she did not go for further evaluation.  She was found to be transiently confused in the setting of presyncopal event patient was concerned she was having a recurrent stroke but CT head showed no acute abnormality..  She was seen by cardiologist Dr. Shari Richardson recently who thought the episodes were presyncopal events related to constipation.  Echocardiogram on 02/24/2022 showed ejection fraction of 60 to 65% without significant valvular abnormalities.  She had external cardiac monitoring done which did not show significant arrhythmias.  Patient has recently started new constipation medication which seems to be helping.  She remains on aspirin which she is tolerating well without bleeding but does bruise easily.  Her blood pressure is doing better now in the 120-140 systolic range.  Her sugars are also improved postprandial than the 200 range.  Last lipid profile was 8 but significantly improved from before.  She is able to ambulate using a cane.  She does have mild balance  difficulties but she has had no falls.  She does need to use a walker occasionally.  Patient has sleep apnea and was recommended to use CPAP mask but she has not been using it recently as she finds that the mask is not fitting well. Update 12/11/2021 Martin Richardson: Patient returns for 91-month stroke follow-up accompanied by his daughter.  Overall stable without new stroke/TIA symptoms.  Daughter concerned regarding continued gait difficulty which she believes continues to decline, as well as decline of memory, generalized weakness and less stamina. Did work with PT since the last visit, completed PT after working with them for 2 months. Did see some improvement but plateaued per patient. Not routinely doing exercises at home as advised.  He tries to stay active in his community and with family but when not going some where or with family, he spends his time watching TV.  He continues to live alone but family does assist with some IADLs such as medication management.  Ambulates with a cane outdoors but will ambulate in house without cane. Denies any falls but feels unsteady.  He will occasionally feel dizziness which is not new.  He does mention difficulty tolerating BiPAP, was seen by Dr. Vickey Richardson last month for BiPAP follow-up, having difficulty tolerating mask and just recently obtained gecko skin patch for nasal bridge (had difficulty finding), he is motivated to continue to try and use.    History provided for reference purposes only Update 06/05/2021 Martin Richardson: Patient returns for initial CPAP follow-up visit as well  as stroke follow-up.  Stroke:  Overall stable without new stroke/TIA symptoms.  Cognition has been stable since prior visit.  Daughter believes word finding difficulty and hesitation has improved since prior visit. Daughter concerned regarding gradual decline of ambulation/balance and occasionally knees giving out. He does have neuropathy.  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  Was  working with [facility] back in December for BPPV, he will occasionally do Epley maneuver but feels this may worsen vertigo. he ambulates with a cane. Continues to maintain ADLs independently, daughters assist with IADLs.  Compliant on aspirin and atorvastatin, denies side effects.  Blood pressure today 118/60.    Sleep apnea:  Evaluated by Dr. Vickey Richardson 1/26 with prior history of sleep apnea.  Initially underwent split-night study on 2/5 but only reported 8.5 minutes of sleep therefore proceeded with HST.  HST completed 3/20 which showed calculated AHI 69.9/h consistent with severe sleep apnea in conjunction with hypoxia and recommend initiating AutoPap.  AutoPap initiated 3/28. Initially had difficulty tolerating full face mask, now using hybrid mask with improvement of tolerance.  Reports improvement of sleep quality and some increase energy during the day since starting.  Epworth Sleepiness Scale 20/24 (before start 20/24).  Fatigue severity scale 37/63 (before start 35/63).        Initial visit 11/26/2020 Martin Richardson: Patient being seen for hospital follow up accompanied by his daughter. Overall stable since discharge. She has since returned back to his own home living independently (stayed with family for 1-2 weeks after discharge). He has since returned back to all prior activities. Maintains ADLs independently - does have maid for more intense cleaning, eats out a lot but will cook simple meals, he reports paying his own bills (although per daughter, his other daughter recently started assisting with this), has not yet returned back to driving (he was driving locally prior). Denies new or reoccurring stroke/TIA symptoms. Compliant on aspirin and atorvastatin without side effects. Blood pressure today 122/68.    Daughter has multiple other concerns today:   Drooling - present prior to recent stroke but questions worsening after recent stroke. Questions possible difficulty swallowing but does have hx  of esophageal stricture needing stretching in the past  BUE Tremor present prior to stroke - not constant - usually occurs when he tries to write or hold an object. No tremors at rest.   Mild cognitive impairment - chronic. no worsening since stroke, short term memory loss main concern  Insomnia - chronic. Takes benadryl nightly (as advised by his granddaughter who is a Teacher, early years/pre); hx of sleep apnea intolerant to CPAP - no use over the past 2 years  Occasional word finding difficulty - chronic. daughter believes worsening since recent stroke  On ROS sheet which was completed by daughters: Fatigue, blurred vision, loss of vision, easy bruising, easy bleeding, palpitations, swelling of legs, snoring, feeling cold, ringing in ears, spinning sensation, trouble swallowing, constipation, joint pain, rash, urination problems, incontinence, incontinence, allergies, skin sensitivity, memory loss, weakness, dizziness, tremor, insomnia, sleepiness, snoring, depression, too much sleep, not enough sleep, decreased energy, disinterest in activities and racing thoughts   Stroke admission 10/07/2020 [name] is a 89 y.o. male with history of left eye blindness, cancer (left kidney), arthritis, cataract, CKD, DM, glaucoma, HTN, hx of vertigo, neuropathy and prior stroke who presented on 10/07/2020 from home with acute onset of expressive dysphasia (speaking and texting) that started around 8:30am initially resolved but en route to ED via EMS, reoccurrence  of expressive aphasia "could not get words out" in conjunction with right facial droop which both rapidly resolved.  Personally reviewed hospitalization pertinent progress notes, lab work and imaging.  Evaluated by Dr. Pearlean Brownie.  Stroke work-up revealed right precentral gyrus infarct embolic possibly secondary to cardioembolic source such as atrial fibrillation (hx documented on neuro note but not other mention of prior hx).  Baseline mild cognitive  impairment as well as fall risk with multiple falls and lives alone but medication noncompliance and deemed to not be a good AC candidate there further monitoring not indicated.  Carotid Dopplers no significant stenosis.  EF 50 to 55%.  LDL 13.  A1c 9.3.  On aspirin 81 mg PTA although missed doses frequently and advised to restart aspirin 81 mg daily as well as continuation of atorvastatin 80 mg daily.  HTN stable on amlodipine.  Therapy eval's recommended HH therapies (chronic dizziness/vertigo, with 24-hour supervision and d/c'd to stay with his daughter until further recovery.        PERTINENT IMAGING  MR BRAIN 10/07/2020 IMPRESSION: 1. Small acute infarcts in the right precentral gyrus and the anterior right frontal white matter. No significant edema or mass effect. 2. Remote infarcts in the right inferior cerebellum and right temporal lobe. 3. Mild for age chronic microvascular ischemic disease and atrophy.  MRA HEAD 10/07/2020 IMPRESSION: 1. Artifact rather than ELVO suspected at the Left MCA trifurcation, with MRI pending at this time. But if DWI is positive in the Left MCA territory then an LVO can not be excluded and should be considered. Follow-up CTA Head may be valuable in that scenario. 2. Otherwise negative for age intracranial MRA.  2D ECHO 10/07/2020 IMPRESSIONS   1. Left ventricular ejection fraction, by estimation, is 50 to 55%. The  left ventricle has low normal function. The left ventricle has no regional  wall motion abnormalities. Left ventricular diastolic parameters are  consistent with Grade I diastolic  dysfunction (impaired relaxation).   2. Right ventricular systolic function is normal. The right ventricular  size is normal.   3. Left atrial size was severely dilated.   4. The mitral valve is normal in structure. No evidence of mitral valve  regurgitation. No evidence of mitral stenosis.   5. The aortic valve is tricuspid. There is mild calcification of  the  aortic valve. There is mild thickening of the aortic valve. Aortic valve  regurgitation is trivial. Mild aortic valve sclerosis is present, with no  evidence of aortic valve stenosis.  Aortic valve area, by VTI measures 3.54 cm. Aortic valve mean gradient  measures 4.0 mmHg. Aortic valve Vmax measures 1.22 m/s.   6. Aortic dilatation noted. 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   7. The inferior vena cava is normal in size with greater than 50%  respiratory variability, suggesting right atrial pressure of 3 mmHg.        ROS:   14 system review of systems performed and negative with exception of those listed in HPI  PMH:  Past Medical History:  Diagnosis Date   Arthritis    Blind left eye    Cancer of left kidney (HCC)    Cataract    Chronic kidney disease    Constipation    Diabetes mellitus without complication (HCC)    Glaucoma    Hypertension    Neuropathy    Stroke (HCC)     PSH:  Past Surgical History:  Procedure Laterality Date   CHOLECYSTECTOMY     LEFT HEART CATH AND CORONARY ANGIOGRAPHY N/A 12/16/2019   Procedure: LEFT HEART CATH AND CORONARY ANGIOGRAPHY;  Surgeon: Swaziland, Peter M, MD;  Location: [facility] INVASIVE CV LAB;  Service: Cardiovascular;  Laterality: N/A;   ORIF ANKLE FRACTURE Right 08/13/2019   Procedure: OPEN REDUCTION INTERNAL FIXATION (ORIF) ANKLE FRACTURE;  Surgeon: Sheral Apley, MD;  Location: [facility] OR;  Service: Orthopedics;  Laterality: Right;   THYROID SURGERY     TONSILLECTOMY      Social History:  Social History   Socioeconomic History   Marital status: Widowed    Spouse name: Not on file   Number of children: Not on file   Years of education: Not on file   Highest education level: Not on file  Occupational History   Occupation: retired  Tobacco Use   Smoking status: Former    Types: Pipe    Quit date: 1983    Years since quitting: 41.5   Smokeless tobacco: Former   Substance and Sexual Activity   Alcohol use: Yes    Comment: social   Drug use: Never   Sexual activity: Not on file  Other Topics Concern   Not on file  Social History Narrative   Not on file   Social Determinants of Health   Financial Resource Strain: Not on file  Food Insecurity: No Food Insecurity (02/09/2022)   Hunger Vital Sign    Worried About Running Out of Food in the Last Year: Never true    Ran Out of Food in the Last Year: Never true  Transportation Needs: No Transportation Needs (02/09/2022)   PRAPARE - Administrator, Civil Service (Medical): No    Lack of Transportation (Non-Medical): No  Physical Activity: Not on file  Stress: Not on file  Social Connections: Not on file  Intimate Partner Violence: Not At Risk (02/09/2022)   Humiliation, Afraid, Rape, and Kick questionnaire    Fear of Current or Ex-Partner: No    Emotionally Abused: No    Physically Abused: No    Sexually Abused: No    Family History:  Family History  Problem Relation Age of Onset   Diabetes Mellitus II Mother     Medications:   Current Outpatient Medications on File Prior to Visit  Medication Sig Dispense Refill   acetaminophen (TYLENOL) 325 MG tablet Take 650 mg by mouth every 4 (four) hours as needed for mild pain or headache.     aspirin EC 81 MG EC tablet Take 1 tablet (81 mg total) by mouth daily. Swallow whole. 30 tablet 11   atorvastatin (LIPITOR) 80 MG tablet Take 80 mg by mouth daily.     BETIMOL 0.5 % ophthalmic solution Apply 1 drop to eye 2 (two) times daily.     cholecalciferol (VITAMIN D3) 25 MCG (1000 UNIT) tablet Take 1,000 Units by mouth daily.     docusate sodium (COLACE) 100 MG capsule Take 100 mg by mouth 2 (two) times daily as needed for mild constipation.     ENULOSE 10 GM/15ML SOLN Take 10 g by mouth as needed.     famotidine (PEPCID) 20 MG tablet Take 20 mg by mouth at bedtime as needed for heartburn or indigestion.     gabapentin (NEURONTIN) 100 MG  capsule Take 2 capsules (200 mg total) by mouth at bedtime. 60 capsule 0   Insulin Pen Needle 29G X MISC Use as directed 200 each 0  levothyroxine (SYNTHROID) 125 MCG tablet Take 125 mcg by mouth daily before breakfast.     ONETOUCH ULTRA test strip 1 each 2 (two) times daily.     pantoprazole (PROTONIX) 40 MG tablet Take 40 mg by mouth daily.     polyethylene glycol powder (MIRALAX) 17 GM/SCOOP powder 1 scoop mixed with 8 ounces of fluid Orally Once a day for 30 day(s)     Probiotic Product (ALIGN) 4 MG CAPS Take 4 mg by mouth daily.     tamsulosin (FLOMAX) 0.4 MG CAPS capsule Take 1 capsule (0.4 mg total) by mouth daily. (Patient taking differently: Take 0.8 mg by mouth at bedtime.) 30 capsule    terbinafine (LAMISIL) 1 % cream Apply 1 Application topically daily.     triamcinolone ointment (KENALOG) 0.1 % Apply 1 Application topically daily as needed (rash).     amLODipine (NORVASC) 5 MG tablet Take 5 mg by mouth daily as needed (SBP> 140 or DBP> 80). (Patient not taking: Reported on 08/20/2022)     insulin glargine (LANTUS) 100 UNIT/ML injection Inject 0.2 mLs (20 Units total) into the skin 2 (two) times daily. Lantus 45 units every morning, 40 units evening time, 20 mL 0   No current facility-administered medications on file prior to visit.    Allergies:   Allergies  Allergen Reactions   Other Other (See Comments)    Other reaction(s): rash   Tape Rash    Other reaction(s): rash      OBJECTIVE:  Physical Exam  Vitals:   08/20/22 1312  BP: 139/66  Pulse: 76  Weight: 231 lb (104.8 kg)  Height: 5\' 11"  (1.803 m)    Body mass index is 32.22 kg/m. No results found.   General: obese elderlyvery pleasant elderly Caucasian male, seated, in no evident distress Head: head normocephalic and atraumatic.   Neck: supple with no carotid or supraclavicular bruits Cardiovascular: regular rate and rhythm, no murmurs Musculoskeletal: no deformity Skin:  no  rash/petichiae Vascular:  Normal pulses all extremities   Neurologic Exam Mental Status: Awake and fully alert.  Fluent speech and language.  Oriented to place and time. Recent memory impaired and remote memory intact. Attention span, concentration and fund of knowledge mostly appropriate during visit. Mood and affect appropriate.  Cranial Nerves: Pupil briskly reactive to light in OD, OS pupil 1mm and nonreactive. OS severe visual loss at baseline.  Extraocular movements full without nystagmus. Visual fields intact in OD. Hearing intact. Facial sensation intact. Very slight left lower facial weakness (more noticeable when smiling).  Tongue, and palate moves normally and symmetrically.  Motor: Normal bulk and tone. Normal strength in all tested extremity muscles Sensory.: intact to touch , pinprick , position and vibratory sensation.  Coordination: Rapid alternating movements normal in all extremities. Finger-to-nose mild LUE incoordination and heel-to-shin performed accurately bilaterally. Mild postural tremor L>R UE.  No evidence of resting tremor or cogwheel rigidity Gait and Station: Arises from chair without difficulty. Stance is slightly hunched. Gait demonstrates decreased stride length and step height and mild imbalance with use of cane. Tandem walk and heel toe not attempted.  Appropriate arm swing (although needs cane for ambulation).  Some difficulty with turns. Reflexes: 1+ and symmetric. Toes downgoing.         ASSESSMENT: Coree Brame is a 89 y.o. year old male with right precentral gyrus stroke on 10/07/2020, embolic secondary to unknown source - documented possibly in setting of A. Fib but unable to verify prior history. Not  a AC candidate d/t high fall risk, cognitive impairment, lives alone and does not consistently take medications (d/t cognitive impairment) therefore no indication for prolonged cardiac monitoring for possible A. fib. Vascular risk factors include HTN, HLD,  DM with neuropathy, mild cognitive impairment and prior stroke history (R cerebellum and R temporal lobe).      PLAN: I had a long d/w patient and family member about his remote stroke, risk for recurrent stroke/TIAs, personally independently reviewed imaging studies and stroke evaluation results and answered questions.Continue aspirin 81 mg daily  for secondary stroke prevention and maintain strict control of hypertension with blood pressure goal below 130/90, diabetes with hemoglobin A1c goal below 6.5% and lipids with LDL cholesterol goal below 70 mg/dL. I also advised the patient to eat a healthy diet with plenty of whole grains, cereals, fruits and vegetables, exercise regularly and maintain ideal body weight.  Check screening carotid ultrasound study.  Patient was counseled to be compliant with his CPAP for his sleep apnea and to follow-up in the sleep clinic if he has trouble tolerating his CPAP mask.  He was advised to use his cane at all times and we discussed fall safety precautions.  Followup in the future with [facility] nurse practitioner in 6 months or call earlier if necessary.  Greater than 50% time during this 35-minute visit was spent on counseling and coordination of care about his stroke and answering questions Delia Heady, MD  [facility] [address] [place], [place] [address]  Phone [phone] Fax [phone] Note: This document was prepared with digital dictation and possible smart phrase technology. Any transcriptional errors that result from this process are unintentional.

## 2022-08-20 NOTE — Patient Instructions (Signed)
I had a long d/w patient and family member about his remote stroke, risk for recurrent stroke/TIAs, personally independently reviewed imaging studies and stroke evaluation results and answered questions.Continue aspirin 81 mg daily  for secondary stroke prevention and maintain strict control of hypertension with blood pressure goal below 130/90, diabetes with hemoglobin A1c goal below 6.5% and lipids with LDL cholesterol goal below 70 mg/dL. I also advised the patient to eat a healthy diet with plenty of whole grains, cereals, fruits and vegetables, exercise regularly and maintain ideal body weight.  Check screening carotid ultrasound study.  Patient was counseled to be compliant with his CPAP for his sleep apnea and to follow-up in the sleep clinic if he has trouble tolerating his CPAP mask.  He was advised to use his cane at all times and we discussed fall safety precautions.  Followup in the future with [facility] nurse practitioner in 6 months or call earlier if necessary.

## 2022-08-25 DIAGNOSIS — R2681 Unsteadiness on feet: Secondary | ICD-10-CM | POA: Diagnosis not present

## 2022-08-25 DIAGNOSIS — R5381 Other malaise: Secondary | ICD-10-CM | POA: Diagnosis not present

## 2022-08-25 DIAGNOSIS — M6281 Muscle weakness (generalized): Secondary | ICD-10-CM | POA: Diagnosis not present

## 2022-08-25 DIAGNOSIS — R293 Abnormal posture: Secondary | ICD-10-CM | POA: Diagnosis not present

## 2022-08-26 ENCOUNTER — Ambulatory Visit (HOSPITAL_COMMUNITY)
Admission: RE | Admit: 2022-08-26 | Discharge: 2022-08-26 | Disposition: A | Discharge status: Home / Self Care | Payer: Medicare Other | Source: Ambulatory Visit | Attending: Neurology | Admitting: Neurology

## 2022-08-26 DIAGNOSIS — Z8673 Personal history of transient ischemic attack (TIA), and cerebral infarction without residual deficits: Secondary | ICD-10-CM | POA: Diagnosis not present

## 2022-08-26 NOTE — Progress Notes (Signed)
Bilateral carotid ultrasound study completed.   Please see CV Procedures for preliminary results.  Christene Lye, RVT  4:37 PM 08/26/22

## 2022-08-27 ENCOUNTER — Encounter (HOSPITAL_COMMUNITY): Payer: Medicare Other

## 2022-08-29 DIAGNOSIS — M6281 Muscle weakness (generalized): Secondary | ICD-10-CM | POA: Diagnosis not present

## 2022-08-29 DIAGNOSIS — R293 Abnormal posture: Secondary | ICD-10-CM | POA: Diagnosis not present

## 2022-08-29 DIAGNOSIS — R2681 Unsteadiness on feet: Secondary | ICD-10-CM | POA: Diagnosis not present

## 2022-08-29 DIAGNOSIS — R5381 Other malaise: Secondary | ICD-10-CM | POA: Diagnosis not present

## 2022-09-01 DIAGNOSIS — R5381 Other malaise: Secondary | ICD-10-CM | POA: Diagnosis not present

## 2022-09-01 DIAGNOSIS — R293 Abnormal posture: Secondary | ICD-10-CM | POA: Diagnosis not present

## 2022-09-01 DIAGNOSIS — R2681 Unsteadiness on feet: Secondary | ICD-10-CM | POA: Diagnosis not present

## 2022-09-01 DIAGNOSIS — M6281 Muscle weakness (generalized): Secondary | ICD-10-CM | POA: Diagnosis not present

## 2022-09-03 ENCOUNTER — Telehealth: Payer: Self-pay

## 2022-09-03 NOTE — Progress Notes (Signed)
Kindly inform the patient that carotid ultrasound study does not show any significant blockages of either carotid artery in the neck

## 2022-09-03 NOTE — Telephone Encounter (Signed)
-----   Message from Delia Heady sent at 09/03/2022  1:47 PM EDT ----- Joneen Roach inform the patient that carotid ultrasound study does not show any significant blockages of either carotid artery in the neck

## 2022-09-03 NOTE — Telephone Encounter (Signed)
Called patient and spoke with pt sister to informed him of Dr. Mayra Neer results. Per Dr. Pearlean Brownie "Kindly inform the patient that carotid ultrasound study does not show any significant blockages of either carotid artery in the neck" Pt verbalized understanding. Pt had no questions at this time but was encouraged to call back if questions arise.

## 2022-09-05 DIAGNOSIS — R5381 Other malaise: Secondary | ICD-10-CM | POA: Diagnosis not present

## 2022-09-05 DIAGNOSIS — R293 Abnormal posture: Secondary | ICD-10-CM | POA: Diagnosis not present

## 2022-09-05 DIAGNOSIS — M6281 Muscle weakness (generalized): Secondary | ICD-10-CM | POA: Diagnosis not present

## 2022-09-05 DIAGNOSIS — R2681 Unsteadiness on feet: Secondary | ICD-10-CM | POA: Diagnosis not present

## 2022-09-08 DIAGNOSIS — M6281 Muscle weakness (generalized): Secondary | ICD-10-CM | POA: Diagnosis not present

## 2022-09-08 DIAGNOSIS — R293 Abnormal posture: Secondary | ICD-10-CM | POA: Diagnosis not present

## 2022-09-08 DIAGNOSIS — R5381 Other malaise: Secondary | ICD-10-CM | POA: Diagnosis not present

## 2022-09-08 DIAGNOSIS — R2681 Unsteadiness on feet: Secondary | ICD-10-CM | POA: Diagnosis not present

## 2022-09-12 DIAGNOSIS — R293 Abnormal posture: Secondary | ICD-10-CM | POA: Diagnosis not present

## 2022-09-12 DIAGNOSIS — R2681 Unsteadiness on feet: Secondary | ICD-10-CM | POA: Diagnosis not present

## 2022-09-12 DIAGNOSIS — M6281 Muscle weakness (generalized): Secondary | ICD-10-CM | POA: Diagnosis not present

## 2022-09-12 DIAGNOSIS — R5381 Other malaise: Secondary | ICD-10-CM | POA: Diagnosis not present

## 2022-09-15 DIAGNOSIS — M6281 Muscle weakness (generalized): Secondary | ICD-10-CM | POA: Diagnosis not present

## 2022-09-15 DIAGNOSIS — R5381 Other malaise: Secondary | ICD-10-CM | POA: Diagnosis not present

## 2022-09-15 DIAGNOSIS — R293 Abnormal posture: Secondary | ICD-10-CM | POA: Diagnosis not present

## 2022-09-15 DIAGNOSIS — R2681 Unsteadiness on feet: Secondary | ICD-10-CM | POA: Diagnosis not present

## 2022-09-16 ENCOUNTER — Institutional Professional Consult (permissible substitution): Payer: Medicare Other | Admitting: Neurology

## 2022-09-19 DIAGNOSIS — R293 Abnormal posture: Secondary | ICD-10-CM | POA: Diagnosis not present

## 2022-09-19 DIAGNOSIS — M6281 Muscle weakness (generalized): Secondary | ICD-10-CM | POA: Diagnosis not present

## 2022-09-19 DIAGNOSIS — R5381 Other malaise: Secondary | ICD-10-CM | POA: Diagnosis not present

## 2022-09-19 DIAGNOSIS — R2681 Unsteadiness on feet: Secondary | ICD-10-CM | POA: Diagnosis not present

## 2022-09-25 ENCOUNTER — Other Ambulatory Visit: Payer: Self-pay

## 2022-09-25 ENCOUNTER — Emergency Department (HOSPITAL_BASED_OUTPATIENT_CLINIC_OR_DEPARTMENT_OTHER)
Admission: EM | Admit: 2022-09-25 | Discharge: 2022-09-26 | Disposition: A | Discharge status: Home / Self Care | Payer: Medicare Other | Attending: Emergency Medicine | Admitting: Emergency Medicine

## 2022-09-25 ENCOUNTER — Encounter (HOSPITAL_BASED_OUTPATIENT_CLINIC_OR_DEPARTMENT_OTHER): Payer: Self-pay

## 2022-09-25 ENCOUNTER — Emergency Department (HOSPITAL_BASED_OUTPATIENT_CLINIC_OR_DEPARTMENT_OTHER): Payer: Medicare Other

## 2022-09-25 DIAGNOSIS — D1801 Hemangioma of skin and subcutaneous tissue: Secondary | ICD-10-CM | POA: Diagnosis not present

## 2022-09-25 DIAGNOSIS — Z79899 Other long term (current) drug therapy: Secondary | ICD-10-CM | POA: Diagnosis not present

## 2022-09-25 DIAGNOSIS — Z794 Long term (current) use of insulin: Secondary | ICD-10-CM | POA: Insufficient documentation

## 2022-09-25 DIAGNOSIS — U071 COVID-19: Secondary | ICD-10-CM | POA: Diagnosis not present

## 2022-09-25 DIAGNOSIS — I10 Essential (primary) hypertension: Secondary | ICD-10-CM | POA: Insufficient documentation

## 2022-09-25 DIAGNOSIS — Z7982 Long term (current) use of aspirin: Secondary | ICD-10-CM | POA: Diagnosis not present

## 2022-09-25 DIAGNOSIS — R0989 Other specified symptoms and signs involving the circulatory and respiratory systems: Secondary | ICD-10-CM | POA: Diagnosis not present

## 2022-09-25 DIAGNOSIS — L820 Inflamed seborrheic keratosis: Secondary | ICD-10-CM | POA: Diagnosis not present

## 2022-09-25 DIAGNOSIS — L570 Actinic keratosis: Secondary | ICD-10-CM | POA: Diagnosis not present

## 2022-09-25 DIAGNOSIS — L821 Other seborrheic keratosis: Secondary | ICD-10-CM | POA: Diagnosis not present

## 2022-09-25 DIAGNOSIS — R519 Headache, unspecified: Secondary | ICD-10-CM | POA: Diagnosis not present

## 2022-09-25 DIAGNOSIS — R509 Fever, unspecified: Secondary | ICD-10-CM | POA: Diagnosis not present

## 2022-09-25 DIAGNOSIS — R059 Cough, unspecified: Secondary | ICD-10-CM | POA: Diagnosis not present

## 2022-09-25 DIAGNOSIS — E119 Type 2 diabetes mellitus without complications: Secondary | ICD-10-CM | POA: Insufficient documentation

## 2022-09-25 LAB — COMPREHENSIVE METABOLIC PANEL
ALT: 13 U/L (ref 0–44)
AST: 13 U/L — ABNORMAL LOW (ref 15–41)
Albumin: 4.1 g/dL (ref 3.5–5.0)
Alkaline Phosphatase: 77 U/L (ref 38–126)
Anion gap: 8 (ref 5–15)
BUN: 39 mg/dL — ABNORMAL HIGH (ref 8–23)
CO2: 23 mmol/L (ref 22–32)
Calcium: 9.7 mg/dL (ref 8.9–10.3)
Chloride: 105 mmol/L (ref 98–111)
Creatinine, Ser: 2.81 mg/dL — ABNORMAL HIGH (ref 0.61–1.24)
GFR, Estimated: 20 mL/min — ABNORMAL LOW (ref >60)
Glucose, Bld: 233 mg/dL — ABNORMAL HIGH (ref 70–99)
Potassium: 5.1 mmol/L (ref 3.5–5.1)
Sodium: 136 mmol/L (ref 135–145)
Total Bilirubin: 0.6 mg/dL (ref 0.3–1.2)
Total Protein: 6.9 g/dL (ref 6.5–8.1)

## 2022-09-25 LAB — URINALYSIS, ROUTINE W REFLEX MICROSCOPIC
Bacteria, UA: NONE SEEN
Bilirubin Urine: NEGATIVE
Glucose, UA: 500 mg/dL — AB
Ketones, ur: NEGATIVE mg/dL
Leukocytes,Ua: NEGATIVE
Nitrite: NEGATIVE
Protein, ur: 100 mg/dL — AB
Specific Gravity, Urine: 1.016 (ref 1.005–1.030)
pH: 6.5 (ref 5.0–8.0)

## 2022-09-25 LAB — CBC WITH DIFFERENTIAL/PLATELET
Abs Immature Granulocytes: 0.08 10*3/uL — ABNORMAL HIGH (ref 0.00–0.07)
Basophils Absolute: 0.1 10*3/uL (ref 0.0–0.1)
Basophils Relative: 1 %
Eosinophils Absolute: 0.6 10*3/uL — ABNORMAL HIGH (ref 0.0–0.5)
Eosinophils Relative: 7 %
HCT: 37.3 % — ABNORMAL LOW (ref 39.0–52.0)
Hemoglobin: 12.6 g/dL — ABNORMAL LOW (ref 13.0–17.0)
Immature Granulocytes: 1 %
Lymphocytes Relative: 7 %
Lymphs Abs: 0.6 10*3/uL — ABNORMAL LOW (ref 0.7–4.0)
MCH: 28.9 pg (ref 26.0–34.0)
MCHC: 33.8 g/dL (ref 30.0–36.0)
MCV: 85.6 fL (ref 80.0–100.0)
Monocytes Absolute: 0.9 10*3/uL (ref 0.1–1.0)
Monocytes Relative: 11 %
Neutro Abs: 6.5 10*3/uL (ref 1.7–7.7)
Neutrophils Relative %: 73 %
Platelets: 158 10*3/uL (ref 150–400)
RBC: 4.36 MIL/uL (ref 4.22–5.81)
RDW: 13.4 % (ref 11.5–15.5)
WBC: 8.8 10*3/uL (ref 4.0–10.5)
nRBC: 0 % (ref 0.0–0.2)

## 2022-09-25 LAB — RESP PANEL BY RT-PCR (RSV, FLU A&B, COVID)  RVPGX2
Influenza A by PCR: NEGATIVE
Influenza B by PCR: NEGATIVE
Resp Syncytial Virus by PCR: NEGATIVE
SARS Coronavirus 2 by RT PCR: POSITIVE — AB

## 2022-09-25 LAB — LACTIC ACID, PLASMA: Lactic Acid, Venous: 1.1 mmol/L (ref 0.5–1.9)

## 2022-09-25 MED ORDER — ACETAMINOPHEN 325 MG PO TABS
650.0000 mg | ORAL_TABLET | Freq: Once | ORAL | Status: AC
  Administered 2022-09-25: 650 mg via ORAL
  Filled 2022-09-25: qty 2

## 2022-09-25 NOTE — ED Notes (Signed)
Pt's dtr later reports that pt has had some confusion x 2 hours and has been incontinent of urine. Pt placed in room. And orders initiated accordingly

## 2022-09-25 NOTE — ED Triage Notes (Signed)
Pt has cough, fever, congestion, headache, and sore throat x 1 day.

## 2022-09-26 LAB — LACTIC ACID, PLASMA: Lactic Acid, Venous: 1.3 mmol/L (ref 0.5–1.9)

## 2022-09-26 MED ORDER — MOLNUPIRAVIR EUA 200MG CAPSULE: 4.0000 | ORAL_CAPSULE | Freq: Two times a day (BID) | ORAL | 0 refills | Status: AC

## 2022-09-26 NOTE — ED Provider Notes (Signed)
[place] EMERGENCY DEPARTMENT AT [facility] Provider Note   CSN: [id number] Arrival date & time: 09/25/22  2111     History  Chief Complaint  Patient presents with   Cough    Martin Richardson is a 89 y.o. male.  Patient is a 89 year old with past medical history of chronic renal insufficiency, diabetes, hypertension, prior CVA, GERD.  Patient presenting today for evaluation of URI symptoms.  He describes runny nose, cough, and fever at home.  He denies any ill contacts.  No aggravating or alleviating factors.  The history is provided by the patient.       Home Medications Prior to Admission medications   Medication Sig Start Date End Date Taking? Authorizing Provider  acetaminophen (TYLENOL) 325 MG tablet Take 650 mg by mouth every 4 (four) hours as needed for mild pain or headache.    [provider]  amLODipine (NORVASC) 5 MG tablet Take 5 mg by mouth daily as needed (SBP> 140 or DBP> 80). Patient not taking: Reported on 08/20/2022    [provider]  aspirin EC 81 MG EC tablet Take 1 tablet (81 mg total) by mouth daily. Swallow whole. 10/09/20   Elgergawy, Leana Roe, MD  atorvastatin (LIPITOR) 80 MG tablet Take 80 mg by mouth daily. 06/13/20   [provider]  BETIMOL 0.5 % ophthalmic solution Apply 1 drop to eye 2 (two) times daily. 06/20/21   [provider]  cholecalciferol (VITAMIN D3) 25 MCG (1000 UNIT) tablet Take 1,000 Units by mouth daily.    [provider]  docusate sodium (COLACE) 100 MG capsule Take 100 mg by mouth 2 (two) times daily as needed for mild constipation.    [provider]  ENULOSE 10 GM/15ML SOLN Take 10 g by mouth as needed. 04/09/22   [provider]  famotidine (PEPCID) 20 MG tablet Take 20 mg by mouth at bedtime as needed for heartburn or indigestion.    [provider]  gabapentin (NEURONTIN) 100 MG capsule Take 2 capsules (200 mg total) by mouth at bedtime. 08/22/19    Drema Dallas, MD  insulin glargine (LANTUS) 100 UNIT/ML injection Inject 0.2 mLs (20 Units total) into the skin 2 (two) times daily. Lantus 45 units every morning, 40 units evening time, 02/10/22 04/11/22  Azucena Fallen, MD  Insulin Pen Needle 29G X MISC Use as directed 08/22/19   Drema Dallas, MD  levothyroxine (SYNTHROID) 125 MCG tablet Take 125 mcg by mouth daily before breakfast.    [provider]  [facility] ULTRA test strip 1 each 2 (two) times daily. 01/09/20   [provider]  pantoprazole (PROTONIX) 40 MG tablet Take 40 mg by mouth daily. 10/17/20   [provider]  polyethylene glycol powder (MIRALAX) 17 GM/SCOOP powder 1 scoop mixed with 8 ounces of fluid Orally Once a day for 30 day(s) 02/17/22   [provider]  Probiotic Product (ALIGN) 4 MG CAPS Take 4 mg by mouth daily.    [provider]  tamsulosin (FLOMAX) 0.4 MG CAPS capsule Take 1 capsule (0.4 mg total) by mouth daily. Patient taking differently: Take 0.8 mg by mouth at bedtime. 10/08/20   Elgergawy, Leana Roe, MD  terbinafine (LAMISIL) 1 % cream Apply 1 Application topically daily.    [provider]  triamcinolone ointment (KENALOG) 0.1 % Apply 1 Application topically daily as needed (rash). 10/15/21   [provider]      Allergies  Other and Tape    Review of Systems   Review of Systems  All other systems reviewed and are negative.   Physical Exam Updated Vital Signs BP 123/88   Pulse 64   Temp 99 F (37.2 C) (Oral)   Resp 11   Wt 103.9 kg   SpO2 92%   BMI 31.94 kg/m  Physical Exam Vitals and nursing note reviewed.  Constitutional:      General: He is not in acute distress.    Appearance: He is well-developed. He is not diaphoretic.  HENT:     Head: Normocephalic and atraumatic.  Cardiovascular:     Rate and Rhythm: Normal rate and regular rhythm.     Heart sounds: No murmur heard.    No friction rub.  Pulmonary:     Effort:  Pulmonary effort is normal. No respiratory distress.     Breath sounds: Normal breath sounds. No wheezing or rales.  Abdominal:     General: Bowel sounds are normal. There is no distension.     Palpations: Abdomen is soft.     Tenderness: There is no abdominal tenderness.  Musculoskeletal:        General: Normal range of motion.     Cervical back: Normal range of motion and neck supple.  Skin:    General: Skin is warm and dry.  Neurological:     Mental Status: He is alert and oriented to person, place, and time.     Coordination: Coordination normal.     ED Results / Procedures / Treatments   Labs (all labs ordered are listed, but only abnormal results are displayed) Labs Reviewed  RESP PANEL BY RT-PCR (RSV, FLU A&B, COVID)  RVPGX2 - Abnormal; Notable for the following components:      Result Value   SARS Coronavirus 2 by RT PCR POSITIVE (*)    All other components within normal limits  CBC WITH DIFFERENTIAL/PLATELET - Abnormal; Notable for the following components:   Hemoglobin 12.6 (*)    HCT 37.3 (*)    Lymphs Abs 0.6 (*)    Eosinophils Absolute 0.6 (*)    Abs Immature Granulocytes 0.08 (*)    All other components within normal limits  COMPREHENSIVE METABOLIC PANEL - Abnormal; Notable for the following components:   Glucose, Bld 233 (*)    BUN 39 (*)    Creatinine, Ser 2.81 (*)    AST 13 (*)    GFR, Estimated 20 (*)    All other components within normal limits  URINALYSIS, ROUTINE W REFLEX MICROSCOPIC - Abnormal; Notable for the following components:   Glucose, UA 500 (*)    Hgb urine dipstick SMALL (*)    Protein, ur 100 (*)    All other components within normal limits  LACTIC ACID, PLASMA  LACTIC ACID, PLASMA    EKG None  Radiology DG Chest Portable 1 View  Result Date: 09/25/2022 CLINICAL DATA:  Cough, fever, congestion, headache, and sore throat for 1 day. EXAM: PORTABLE CHEST 1 VIEW COMPARISON:  02/08/2022 FINDINGS: Shallow inspiration. Heart size and  pulmonary vascularity are normal for technique. Lungs are clear. No pleural effusions. No pneumothorax. Mediastinal contours appear intact. Calcified and tortuous aorta. Surgical clips in the base of the neck. Degenerative changes in the spine. IMPRESSION: No active disease. Electronically Signed   By: Burman Nieves M.D.   On: 09/25/2022 22:22    Procedures Procedures    Medications Ordered in ED Medications  acetaminophen (TYLENOL) tablet 650 mg (650  mg Oral Given 09/25/22 2201)    ED Course/ Medical Decision Making/ A&P  Patient presenting with URI symptoms as described in the HPI.  He arrives here with low-grade fever, but otherwise stable vital signs.  Physical examination basically unremarkable.  CBC and CMP unremarkable, but does show his chronic renal insufficiency with creatinine of 2.8.  Lactate normal.  COVID test unfortunately is positive.  Chest x-ray shows no acute process.  At this point, patient is clinically well-appearing, exhibits no respiratory distress, no hypoxia, or other physical or laboratory findings that would suggest he requires admission.  Patient and daughter requesting antiviral therapy.  I will prescribe molnupiravir and have patient continue over-the-counter medications.  Return as needed for difficulty breathing.  To quarantine for the next 5 days.  Final Clinical Impression(s) / ED Diagnoses Final diagnoses:  None    Rx / DC Orders ED Discharge Orders     None         Geoffery Lyons, MD 09/26/22 [phone]

## 2022-09-26 NOTE — Discharge Instructions (Signed)
Begin taking molnupiravir as prescribed.  Take over-the-counter medications as needed for relief of symptoms.  Isolate at home for the next 5 days or until no longer symptomatic.  Return to the emergency department if you develop chest pain, difficulty breathing, or for other new and concerning symptoms.

## 2022-10-03 DIAGNOSIS — M6281 Muscle weakness (generalized): Secondary | ICD-10-CM | POA: Diagnosis not present

## 2022-10-03 DIAGNOSIS — R2681 Unsteadiness on feet: Secondary | ICD-10-CM | POA: Diagnosis not present

## 2022-10-03 DIAGNOSIS — R293 Abnormal posture: Secondary | ICD-10-CM | POA: Diagnosis not present

## 2022-10-03 DIAGNOSIS — R5381 Other malaise: Secondary | ICD-10-CM | POA: Diagnosis not present

## 2022-10-07 DIAGNOSIS — R0781 Pleurodynia: Secondary | ICD-10-CM | POA: Diagnosis not present

## 2022-10-07 DIAGNOSIS — W19XXXA Unspecified fall, initial encounter: Secondary | ICD-10-CM | POA: Diagnosis not present

## 2022-10-07 DIAGNOSIS — M545 Low back pain, unspecified: Secondary | ICD-10-CM | POA: Diagnosis not present

## 2022-10-14 DIAGNOSIS — Z961 Presence of intraocular lens: Secondary | ICD-10-CM | POA: Diagnosis not present

## 2022-10-14 DIAGNOSIS — H04123 Dry eye syndrome of bilateral lacrimal glands: Secondary | ICD-10-CM | POA: Diagnosis not present

## 2022-10-14 DIAGNOSIS — E119 Type 2 diabetes mellitus without complications: Secondary | ICD-10-CM | POA: Diagnosis not present

## 2022-10-14 DIAGNOSIS — H1131 Conjunctival hemorrhage, right eye: Secondary | ICD-10-CM | POA: Diagnosis not present

## 2022-10-14 DIAGNOSIS — Z794 Long term (current) use of insulin: Secondary | ICD-10-CM | POA: Diagnosis not present

## 2022-10-14 DIAGNOSIS — H401131 Primary open-angle glaucoma, bilateral, mild stage: Secondary | ICD-10-CM | POA: Diagnosis not present

## 2022-10-20 DIAGNOSIS — R2681 Unsteadiness on feet: Secondary | ICD-10-CM | POA: Diagnosis not present

## 2022-10-20 DIAGNOSIS — M6281 Muscle weakness (generalized): Secondary | ICD-10-CM | POA: Diagnosis not present

## 2022-10-20 DIAGNOSIS — R5381 Other malaise: Secondary | ICD-10-CM | POA: Diagnosis not present

## 2022-10-20 DIAGNOSIS — R293 Abnormal posture: Secondary | ICD-10-CM | POA: Diagnosis not present

## 2022-10-21 DIAGNOSIS — R350 Frequency of micturition: Secondary | ICD-10-CM | POA: Diagnosis not present

## 2022-10-21 DIAGNOSIS — N1330 Unspecified hydronephrosis: Secondary | ICD-10-CM | POA: Diagnosis not present

## 2022-10-21 DIAGNOSIS — R3914 Feeling of incomplete bladder emptying: Secondary | ICD-10-CM | POA: Diagnosis not present

## 2022-11-06 DIAGNOSIS — E1122 Type 2 diabetes mellitus with diabetic chronic kidney disease: Secondary | ICD-10-CM | POA: Diagnosis not present

## 2022-11-06 DIAGNOSIS — N184 Chronic kidney disease, stage 4 (severe): Secondary | ICD-10-CM | POA: Diagnosis not present

## 2022-11-11 DIAGNOSIS — C642 Malignant neoplasm of left kidney, except renal pelvis: Secondary | ICD-10-CM | POA: Diagnosis not present

## 2022-11-11 DIAGNOSIS — D631 Anemia in chronic kidney disease: Secondary | ICD-10-CM | POA: Diagnosis not present

## 2022-11-11 DIAGNOSIS — N184 Chronic kidney disease, stage 4 (severe): Secondary | ICD-10-CM | POA: Diagnosis not present

## 2022-11-11 DIAGNOSIS — R338 Other retention of urine: Secondary | ICD-10-CM | POA: Diagnosis not present

## 2022-11-11 DIAGNOSIS — N2581 Secondary hyperparathyroidism of renal origin: Secondary | ICD-10-CM | POA: Diagnosis not present

## 2022-11-11 DIAGNOSIS — I129 Hypertensive chronic kidney disease with stage 1 through stage 4 chronic kidney disease, or unspecified chronic kidney disease: Secondary | ICD-10-CM | POA: Diagnosis not present

## 2022-11-24 DIAGNOSIS — Z23 Encounter for immunization: Secondary | ICD-10-CM | POA: Diagnosis not present

## 2022-12-30 ENCOUNTER — Ambulatory Visit: Payer: Medicare Other | Attending: Cardiology | Admitting: Cardiology

## 2022-12-30 ENCOUNTER — Encounter: Payer: Self-pay | Admitting: Cardiology

## 2022-12-30 VITALS — BP 162/88 | HR 64 | Resp 16 | Ht 71.0 in | Wt 233.6 lb

## 2022-12-30 DIAGNOSIS — R42 Dizziness and giddiness: Secondary | ICD-10-CM | POA: Diagnosis not present

## 2022-12-30 DIAGNOSIS — I2510 Atherosclerotic heart disease of native coronary artery without angina pectoris: Secondary | ICD-10-CM

## 2022-12-30 DIAGNOSIS — N184 Chronic kidney disease, stage 4 (severe): Secondary | ICD-10-CM | POA: Diagnosis not present

## 2022-12-30 DIAGNOSIS — R001 Bradycardia, unspecified: Secondary | ICD-10-CM | POA: Diagnosis not present

## 2022-12-30 DIAGNOSIS — I440 Atrioventricular block, first degree: Secondary | ICD-10-CM

## 2022-12-30 NOTE — Patient Instructions (Signed)
Medication Instructions:  Please discontinue your Amlodipine and Atorvastatin. Continue all other medications as listed.  *If you need a refill on your cardiac medications before your next appointment, please call your pharmacy*  Follow-Up: At [facility], you and your health needs are our priority.  As part of our continuing mission to provide you with exceptional heart care, we have created designated Provider Care Teams.  These Care Teams include your primary Cardiologist (physician) and Advanced Practice Providers (APPs -  Physician Assistants and Nurse Practitioners) who all work together to provide you with the care you need, when you need it.  We recommend signing up for the patient portal called "MyChart".  Sign up information is provided on this After Visit Summary.  MyChart is used to connect with patients for Virtual Visits (Telemedicine).  Patients are able to view lab/test results, encounter notes, upcoming appointments, etc.  Non-urgent messages can be sent to your provider as well.   To learn more about what you can do with MyChart, go to [email or web address].    Your next appointment:   Follow up as needed with Dr Anne Fu

## 2022-12-30 NOTE — Progress Notes (Signed)
Cardiology Office Note:  .   Date:  12/30/2022  ID:  Martin Richardson, DOB [birth year], MRN [id number] PCP: Soundra Pilon, FNP  [facility] Providers Cardiologist:  Meriam Sprague, MD (Inactive)     History of Present Illness: .   Martin Richardson is a 89 y.o. male Discussed with the use of AI scribe History of Present Illness   The patient, a 89 year old with a history of bradycardia, presyncope, first-degree AV block, hypertension, hyperlipidemia, CKD stage four, and obstructive sleep apnea, presents for follow-up. He has had episodes of bradycardia that seem to be related to vagal stimulation. Cardiac monitoring has shown no evidence of pauses, arrhythmias, or high-degree AV block, and there is no indication for a pacemaker. The patient reports no recent presyncope episodes.  His hypertension is well controlled with amlodipine 5mg  daily, and hyperlipidemia is managed with Lipitor 80mg  daily. A cardiac event monitor showed normal sinus rhythm with first-degree AV block, an average heart rate of 62 beats per minute, and no adverse arrhythmias. An echocardiogram showed an EF of 65% with mild dilation of the aortic root. A cardiac catheterization showed two-vessel obstructive CAD without any critical stenosis, and the plan was for medical management.  The patient has been experiencing increased difficulty with his legs, including weakness and difficulty getting up from a chair. He also reports choking more frequently. He has been taking Lipitor, which he will stop to see if it improves his leg symptoms. He also takes Tamulosin for Flomax, which may be causing dizziness. The patient has been living alone and remains as active as possible. He has recently experienced the loss of a close companion, which may be contributing to his symptoms.          Studies Reviewed: .        Results LABS Creatinine: 2.5 (11/06/2022) Hemoglobin: 11.2 (11/06/2022) A1c: 8  DIAGNOSTIC Cardiac  event monitor: Normal sinus rhythm with first-degree AV block, average heart rate of 62 bpm, no adverse arrhythmias (03/20/2022) Echocardiogram: EF 65%, mild dilation of the aortic root measuring 45 mm (02/09/2022) Cardiac catheterization: Two-vessel obstructive CAD without any critical stenosis (12/16/2019)  Risk Assessment/Calculations:           Physical Exam:   VS:  BP (!) 162/88 (BP Location: Left Arm, Patient Position: Sitting, Cuff Size: Normal)   Pulse 64   Resp 16   Ht 5\' 11"  (1.803 m)   Wt 233 lb 9.6 oz (106 kg)   SpO2 94%   BMI 32.58 kg/m    Wt Readings from Last 3 Encounters:  12/30/22 233 lb 9.6 oz (106 kg)  09/25/22 229 lb (103.9 kg)  08/20/22 231 lb (104.8 kg)    GEN: Well nourished, well developed in no acute distress NECK: No JVD; No carotid bruits CARDIAC: RRR, no murmurs, no rubs, no gallops RESPIRATORY:  Clear to auscultation without rales, wheezing or rhonchi  ABDOMEN: Soft, non-tender, non-distended EXTREMITIES:  No edema; No deformity   ASSESSMENT AND PLAN: .    Assessment and Plan    Bradycardia with Presyncope [age]-year-old with bradycardia and presyncope, currently asymptomatic. Previous cardiac monitoring showed no pauses, arrhythmias, or high-degree AV block. Symptoms likely related to vagal syncope. No pacemaker needed. - No pacemaker required  Coronary Artery Disease Two-vessel obstructive CAD without critical stenosis, managed medically. Echocardiogram shows EF of 65% with mild aortic root dilation. Current medical management is appropriate. - Continue current medical management  Hypertension Hypertension well controlled - Discontinue PRN amlodipine  Hyperlipidemia Hyperlipidemia managed with atorvastatin 80 mg daily. Considering discontinuation to assess for potential side effects on leg discomfort. Changes in leg symptoms should be noticeable within two weeks. - Discontinue atorvastatin for two weeks to assess for improvement in  leg symptoms  Chronic Kidney Disease Stage 4 CKD stage 4, followed by nephrology. Recent labs show creatinine 2.5 and hemoglobin 11.2. Recent intolerance to serum bicarbonate for acidosis. Nephrology will continue to monitor labs. - Continue nephrology follow-up - Monitor labs  Diabetes Mellitus Diabetes with A1c around 8, previously higher. Managed by primary care. Maintaining quality of life is important and current management is acceptable. - Continue diabetes management with primary care  Obstructive Sleep Apnea OSA managed with CPAP, but reports difficulty with mask fit. Importance of finding an optimal mask fit to improve compliance discussed. - Encourage continued use of CPAP with optimal mask fit  Benign Prostatic Hyperplasia BPH managed with tamsulosin 0.4 mg, currently taking two capsules at bedtime. Considering dose reduction due to dizziness. Discussed reducing the dose to one capsule at bedtime and consulting with urology. - Discuss with urology the possibility of reducing tamsulosin to one capsule at bedtime  General Health Maintenance General health maintenance discussed, including physical activity and range of motion exercises. Emphasized the importance of staying active to maintain strength. - Encourage continued physical activity and range of motion exercises  Follow-up - PRN follow-up with cardiology - Continue follow-up with primary care and other specialists as scheduled.               Signed, Donato Schultz, MD

## 2023-01-10 DIAGNOSIS — R410 Disorientation, unspecified: Secondary | ICD-10-CM | POA: Diagnosis not present

## 2023-01-10 DIAGNOSIS — R296 Repeated falls: Secondary | ICD-10-CM | POA: Diagnosis not present

## 2023-02-19 DIAGNOSIS — I10 Essential (primary) hypertension: Secondary | ICD-10-CM | POA: Diagnosis not present

## 2023-02-19 DIAGNOSIS — I2510 Atherosclerotic heart disease of native coronary artery without angina pectoris: Secondary | ICD-10-CM | POA: Diagnosis not present

## 2023-02-19 DIAGNOSIS — E039 Hypothyroidism, unspecified: Secondary | ICD-10-CM | POA: Diagnosis not present

## 2023-02-19 DIAGNOSIS — E785 Hyperlipidemia, unspecified: Secondary | ICD-10-CM | POA: Diagnosis not present

## 2023-02-19 DIAGNOSIS — E1169 Type 2 diabetes mellitus with other specified complication: Secondary | ICD-10-CM | POA: Diagnosis not present

## 2023-02-19 DIAGNOSIS — K219 Gastro-esophageal reflux disease without esophagitis: Secondary | ICD-10-CM | POA: Diagnosis not present

## 2023-02-19 DIAGNOSIS — E1165 Type 2 diabetes mellitus with hyperglycemia: Secondary | ICD-10-CM | POA: Diagnosis not present

## 2023-02-19 DIAGNOSIS — D649 Anemia, unspecified: Secondary | ICD-10-CM | POA: Diagnosis not present

## 2023-02-19 DIAGNOSIS — Z0000 Encounter for general adult medical examination without abnormal findings: Secondary | ICD-10-CM | POA: Diagnosis not present

## 2023-02-19 DIAGNOSIS — M8580 Other specified disorders of bone density and structure, unspecified site: Secondary | ICD-10-CM | POA: Diagnosis not present

## 2023-02-19 DIAGNOSIS — N184 Chronic kidney disease, stage 4 (severe): Secondary | ICD-10-CM | POA: Diagnosis not present

## 2023-02-19 DIAGNOSIS — N2581 Secondary hyperparathyroidism of renal origin: Secondary | ICD-10-CM | POA: Diagnosis not present

## 2023-02-19 DIAGNOSIS — E1142 Type 2 diabetes mellitus with diabetic polyneuropathy: Secondary | ICD-10-CM | POA: Diagnosis not present

## 2023-03-02 ENCOUNTER — Telehealth: Payer: Self-pay | Admitting: Neurology

## 2023-03-02 NOTE — Telephone Encounter (Signed)
 LVM and sent mychart msg informing pt of need to reschedule 05/06/23 appt - MD out

## 2023-03-09 ENCOUNTER — Telehealth: Payer: Self-pay | Admitting: Neurology

## 2023-03-09 NOTE — Telephone Encounter (Signed)
 Pt's daughter, Roddy Citizen called to check if patient still needs to come to appt. Ms.Alexander said is not wearing mask because can not find one that fits,  have rubbed sore on his nose. He is stubborn and will  not wear it. He still has the machine,

## 2023-03-09 NOTE — Telephone Encounter (Signed)
 Spoke with daughter (checked DPR) Per daughter  pt  is no longer wearing  cpap .Has changed mask several times pt states not able to tolerate mask at night . Daughter will discuss with Jessica,NP tomorrow at f/u appointment

## 2023-03-10 ENCOUNTER — Ambulatory Visit: Payer: Medicare Other | Admitting: Adult Health

## 2023-03-10 ENCOUNTER — Encounter: Payer: Self-pay | Admitting: Adult Health

## 2023-03-10 VITALS — BP 173/76 | HR 67 | Ht 71.0 in | Wt 237.0 lb

## 2023-03-10 DIAGNOSIS — Z789 Other specified health status: Secondary | ICD-10-CM | POA: Diagnosis not present

## 2023-03-10 DIAGNOSIS — R269 Unspecified abnormalities of gait and mobility: Secondary | ICD-10-CM | POA: Diagnosis not present

## 2023-03-10 DIAGNOSIS — Z8673 Personal history of transient ischemic attack (TIA), and cerebral infarction without residual deficits: Secondary | ICD-10-CM

## 2023-03-10 DIAGNOSIS — G4739 Other sleep apnea: Secondary | ICD-10-CM | POA: Diagnosis not present

## 2023-03-10 NOTE — Patient Instructions (Addendum)
Continue aspirin 81 mg daily for secondary stroke prevention  Continue to follow up with PCP regarding blood pressure and cholesterol management  Maintain strict control of hypertension with blood pressure goal below 130/90 and cholesterol with LDL cholesterol (bad cholesterol) goal below 70 mg/dL.   Signs of a Stroke? Follow the BEFAST method:  Balance Watch for a sudden loss of balance, trouble with coordination or vertigo Eyes Is there a sudden loss of vision in one or both eyes? Or double vision?  Face: Ask the person to smile. Does one side of the face droop or is it numb?  Arms: Ask the person to raise both arms. Does one arm drift downward? Is there weakness or numbness of a leg? Speech: Ask the person to repeat a simple phrase. Does the speech sound slurred/strange? Is the person confused ? Time: If you observe any of these signs, call 911.  Please schedule follow-up visit with Dr. Vickey Huger to discuss continued difficulty with BiPAP treatment       Thank you for coming to see [place] at [facility]. I hope we have been able to provide you high quality care today.  You may receive a patient satisfaction survey over the next few weeks. We would appreciate your feedback and comments so that we may continue to improve ourselves and the health of our patients.

## 2023-03-10 NOTE — Progress Notes (Signed)
[facility] [address] [place]. [place] [address] [phone]       OFFICE FOLLOW UP NOTE  Mr. Antario Yasuda Gamino Date of Birth:  10-17-1925 Medical Record Number:  [id number]    Primary neurologist: Dr. Pearlean Brownie Sleep neurologist: Dr. Vickey Huger Reason for Referral: stroke follow-up    SUBJECTIVE:   CHIEF COMPLAINT:  Chief Complaint  Patient presents with   Follow-up    Pt in 3 with daughter Pt here fir stroke f/u  Daughter states short-term memory loss pt no longer wears pap machine     HPI:   Update 03/10/2023 JM: Patient returns for stroke follow-up visit after prior visit with Dr. Pearlean Brownie 7 months ago.  He is accompanied by his daughter.  Stable from a stroke standpoint without new stroke/TIA symptoms.  Continues to have difficulty with short-term memory loss such as forgetting how to use his phone or remote, occasionally forgetting appointments or forgetting what he recently ate.  He does live in his own home but has private pay aide assistance 3 days weekly for 4 hours and has 2 daughters that rotate caring for him.  He is able to maintain ADLs independently.  He was previously having issues with increased falls and confusion back in December, prior to that he was prescribed sodium bicarb by nephrology but he discontinued due to difficulty swallowing large tablet, once sodium bicarb was restarted confusion and gait improved.  He did have a fall last week after trying to pick something up on the floor and leaned forward too far, he fell on both of his knees, still having left knee pain, denies hitting his head.  Use of rollator walker at all times.  Compliant on aspirin without side effects.  Atorvastatin discontinued by cardiology back in December due to joint pain which improved after stopping.  Cholesterol levels routinely monitored by PCP which has been satisfactory per daughter, unable to view via epic.  Patient has not been compliant with BiPAP therapy due to  difficulty tolerating despite trial of multiple different masks.  He did note improvement with use of CPAP when able to tolerate. He was scheduled to see Dr. Vickey Huger in April but visit was canceled and not yet rescheduled.       History provided for reference purposes only Update 08/20/2022 Dr. Pearlean Brownie: She returns for follow-up today after last visit 6 months ago with Shanda Bumps nurse practitioner.  Patient had 2 brief episodes in January as well as a month later 1 for 1 of which which went to the emergency room and for the second the EMS were called but she did not go for further evaluation.  She was found to be transiently confused in the setting of presyncopal event patient was concerned she was having a recurrent stroke but CT head showed no acute abnormality..  She was seen by cardiologist Dr. Shari Prows recently who thought the episodes were presyncopal events related to constipation.  Echocardiogram on 02/24/2022 showed ejection fraction of 60 to 65% without significant valvular abnormalities.  She had external cardiac monitoring done which did not show significant arrhythmias.  Patient has recently started new constipation medication which seems to be helping.  She remains on aspirin which she is tolerating well without bleeding but does bruise easily.  Her blood pressure is doing better now in the 120-140 systolic range.  Her sugars are also improved postprandial than the 200 range.  Last lipid profile was 8 but significantly improved from before.  She is able to ambulate  using a cane.  She does have mild balance difficulties but she has had no falls.  She does need to use a walker occasionally.  Patient has sleep apnea and was recommended to use CPAP mask but she has not been using it recently as she finds that the mask is not fitting well.   Update 12/11/2021 JM: 88-month stroke follow-up accompanied by his daughter.  Overall stable without new stroke/TIA symptoms.  Daughter concerned  regarding continued gait difficulty which she believes continues to decline, as well as decline of memory, generalized weakness and less stamina. Did work with PT since the last visit, completed PT after working with them for 2 months. Did see some improvement but plateaued per patient. Not routinely doing exercises at home as advised.  He tries to stay active in his community and with family but when not going some where or with family, he spends his time watching TV.  He continues to live alone but family does assist with some IADLs such as medication management.  Ambulates with a cane outdoors but will ambulate in house without cane. Denies any falls but feels unsteady.  He will occasionally feel dizziness which is not new.  He does mention difficulty tolerating BiPAP, was seen by Dr. Vickey Huger last month for BiPAP follow-up, having difficulty tolerating mask and just recently obtained gecko skin patch for nasal bridge (had difficulty finding), he is motivated to continue to try and use.  Update 06/05/2021 JM: Patient returns for initial CPAP follow-up visit as well as stroke follow-up.  Stroke:  Overall stable without new stroke/TIA symptoms.  Cognition has been stable since prior visit.  Daughter believes word finding difficulty and hesitation has improved since prior visit. Daughter concerned regarding gradual decline of ambulation/balance and occasionally knees giving out. He does have neuropathy.  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  Was working with [facility] back in December for BPPV, he will occasionally do Epley maneuver but feels this may worsen vertigo. he ambulates with a cane. Continues to maintain ADLs independently, daughters assist with IADLs.  Compliant on aspirin and atorvastatin, denies side effects.  Blood pressure today 118/60.   Sleep apnea:  Evaluated by Dr. Vickey Huger 1/26 with prior history of sleep apnea.  Initially underwent split-night study on 2/5 but  only reported 8.5 minutes of sleep therefore proceeded with HST.  HST completed 3/20 which showed calculated AHI 69.9/h consistent with severe sleep apnea in conjunction with hypoxia and recommend initiating AutoPap.  AutoPap initiated 3/28. Initially had difficulty tolerating full face mask, now using hybrid mask with improvement of tolerance.  Reports improvement of sleep quality and some increase energy during the day since starting.  Epworth Sleepiness Scale 20/24 (before start 20/24).  Fatigue severity scale 37/63 (before start 35/63).        Initial visit 11/26/2020 JM: Patient being seen for hospital follow up accompanied by his daughter. Overall stable since discharge. She has since returned back to his own home living independently (stayed with family for 1-2 weeks after discharge). He has since returned back to all prior activities. Maintains ADLs independently - does have maid for more intense cleaning, eats out a lot but will cook simple meals, he reports paying his own bills (although per daughter, his other daughter recently started assisting with this), has not yet returned back to driving (he was driving locally prior). Denies new or reoccurring stroke/TIA symptoms. Compliant on aspirin and atorvastatin without side effects. Blood  pressure today 122/68.    Daughter has multiple other concerns today:   Drooling - present prior to recent stroke but questions worsening after recent stroke. Questions possible difficulty swallowing but does have hx of esophageal stricture needing stretching in the past  BUE Tremor present prior to stroke - not constant - usually occurs when he tries to write or hold an object. No tremors at rest.   Mild cognitive impairment - chronic. no worsening since stroke, short term memory loss main concern  Insomnia - chronic. Takes benadryl nightly (as advised by his granddaughter who is a Teacher, early years/pre); hx of sleep apnea intolerant to CPAP - no use over the past  2 years  Occasional word finding difficulty - chronic. daughter believes worsening since recent stroke  On ROS sheet which was completed by daughters: Fatigue, blurred vision, loss of vision, easy bruising, easy bleeding, palpitations, swelling of legs, snoring, feeling cold, ringing in ears, spinning sensation, trouble swallowing, constipation, joint pain, rash, urination problems, incontinence, incontinence, allergies, skin sensitivity, memory loss, weakness, dizziness, tremor, insomnia, sleepiness, snoring, depression, too much sleep, not enough sleep, decreased energy, disinterest in activities and racing thoughts   Stroke admission 10/07/2020 Mr. Anden Bartolo is a 89 y.o. male with history of left eye blindness, cancer (left kidney), arthritis, cataract, CKD, DM, glaucoma, HTN, hx of vertigo, neuropathy and prior stroke who presented on 10/07/2020 from home with acute onset of expressive dysphasia (speaking and texting) that started around 8:30am initially resolved but en route to ED via EMS, reoccurrence of expressive aphasia "could not get words out" in conjunction with right facial droop which both rapidly resolved.  Personally reviewed hospitalization pertinent progress notes, lab work and imaging.  Evaluated by Dr. Pearlean Brownie.  Stroke work-up revealed right precentral gyrus infarct embolic possibly secondary to cardioembolic source such as atrial fibrillation (hx documented on neuro note but not other mention of prior hx).  Baseline mild cognitive impairment as well as fall risk with multiple falls and lives alone but medication noncompliance and deemed to not be a good AC candidate there further monitoring not indicated.  Carotid Dopplers no significant stenosis.  EF 50 to 55%.  LDL 13.  A1c 9.3.  On aspirin 81 mg PTA although missed doses frequently and advised to restart aspirin 81 mg daily as well as continuation of atorvastatin 80 mg daily.  HTN stable on amlodipine.  Therapy eval's recommended  HH therapies (chronic dizziness/vertigo, with 24-hour supervision and d/c'd to stay with his daughter until further recovery.        PERTINENT IMAGING  MR BRAIN 10/07/2020 IMPRESSION: 1. Small acute infarcts in the right precentral gyrus and the anterior right frontal white matter. No significant edema or mass effect. 2. Remote infarcts in the right inferior cerebellum and right temporal lobe. 3. Mild for age chronic microvascular ischemic disease and atrophy.  MRA HEAD 10/07/2020 IMPRESSION: 1. Artifact rather than ELVO suspected at the Left MCA trifurcation, with MRI pending at this time. But if DWI is positive in the Left MCA territory then an LVO can not be excluded and should be considered. Follow-up CTA Head may be valuable in that scenario. 2. Otherwise negative for age intracranial MRA.  2D ECHO 10/07/2020 IMPRESSIONS   1. Left ventricular ejection fraction, by estimation, is 50 to 55%. The  left ventricle has low normal function. The left ventricle has no regional  wall motion abnormalities. Left ventricular diastolic parameters are  consistent with Grade I diastolic  dysfunction (impaired relaxation).  2. Right ventricular systolic function is normal. The right ventricular  size is normal.   3. Left atrial size was severely dilated.   4. The mitral valve is normal in structure. No evidence of mitral valve  regurgitation. No evidence of mitral stenosis.   5. The aortic valve is tricuspid. There is mild calcification of the  aortic valve. There is mild thickening of the aortic valve. Aortic valve  regurgitation is trivial. Mild aortic valve sclerosis is present, with no  evidence of aortic valve stenosis.  Aortic valve area, by VTI measures 3.54 cm. Aortic valve mean gradient  measures 4.0 mmHg. Aortic valve Vmax measures 1.22 m/s.   6. Aortic dilatation noted. 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   7. The  inferior vena cava is normal in size with greater than 50%  respiratory variability, suggesting right atrial pressure of 3 mmHg.        ROS:   14 system review of systems performed and negative with exception of those listed in HPI  PMH:  Past Medical History:  Diagnosis Date   Arthritis    Blind left eye    Cancer of left kidney (HCC)    Cataract    Chronic kidney disease    Constipation    Diabetes mellitus without complication (HCC)    Glaucoma    Hypertension    Neuropathy    Stroke (HCC)     PSH:  Past Surgical History:  Procedure Laterality Date   CHOLECYSTECTOMY     LEFT HEART CATH AND CORONARY ANGIOGRAPHY N/A 12/16/2019   Procedure: LEFT HEART CATH AND CORONARY ANGIOGRAPHY;  Surgeon: Swazil[place], Peter M, MD;  Location: [facility] INVASIVE CV LAB;  Service: Cardiovascular;  Laterality: N/A;   ORIF ANKLE FRACTURE Right 08/13/2019   Procedure: OPEN REDUCTION INTERNAL FIXATION (ORIF) ANKLE FRACTURE;  Surgeon: Sheral Apley, MD;  Location: [facility] OR;  Service: Orthopedics;  Laterality: Right;   THYROID SURGERY     TONSILLECTOMY      Social History:  Social History   Socioeconomic History   Marital status: Widowed    Spouse name: Not on file   Number of children: Not on file   Years of education: Not on file   Highest education level: Not on file  Occupational History   Occupation: retired  Tobacco Use   Smoking status: Former    Types: Pipe    Quit date: 1983    Years since quitting: 42.1   Smokeless tobacco: Former  Substance and Sexual Activity   Alcohol use: Yes    Comment: social   Drug use: Never   Sexual activity: Not on file  Other Topics Concern   Not on file  Social History Narrative   Pt lives alone    Retired    Social Drivers of Corporate investment banker Strain: Not on file  Food Insecurity: No Food Insecurity (02/09/2022)   Hunger Vital Sign    Worried About Running Out of Food in the Last Year: Never true    Ran Out of Food in the Last  Year: Never true  Transportation Needs: No Transportation Needs (02/09/2022)   PRAPARE - Administrator, Civil Service (Medical): No    Lack of Transportation (Non-Medical): No  Physical Activity: Not on file  Stress: Not on file  Social Connections: Not on file  Intimate Partner Violence: Not At Risk (02/09/2022)   Humiliation, Afraid,  Rape, and Kick questionnaire    Fear of Current or Ex-Partner: No    Emotionally Abused: No    Physically Abused: No    Sexually Abused: No    Family History:  Family History  Problem Relation Age of Onset   Diabetes Mellitus II Mother    Sleep apnea Neg Hx     Medications:   Current Outpatient Medications on File Prior to Visit  Medication Sig Dispense Refill   acetaminophen (TYLENOL) 325 MG tablet Take 650 mg by mouth every 4 (four) hours as needed for mild pain or headache.     aspirin EC 81 MG EC tablet Take 1 tablet (81 mg total) by mouth daily. Swallow whole. 30 tablet 11   BETIMOL 0.5 % ophthalmic solution Apply 1 drop to eye 2 (two) times daily.     cholecalciferol (VITAMIN D3) 25 MCG (1000 UNIT) tablet Take 1,000 Units by mouth daily.     docusate sodium (COLACE) 100 MG capsule Take 100 mg by mouth 2 (two) times daily as needed for mild constipation.     ENULOSE 10 GM/15ML SOLN Take 10 g by mouth as needed.     famotidine (PEPCID) 20 MG tablet Take 20 mg by mouth at bedtime as needed for heartburn or indigestion.     gabapentin (NEURONTIN) 100 MG capsule Take 2 capsules (200 mg total) by mouth at bedtime. (Patient taking differently: Take 100 mg by mouth at bedtime.) 60 capsule 0   Insulin Pen Needle 29G X MISC Use as directed 200 each 0   levothyroxine (SYNTHROID) 125 MCG tablet Take 125 mcg by mouth daily before breakfast.     ONETOUCH ULTRA test strip 1 each 2 (two) times daily.     pantoprazole (PROTONIX) 40 MG tablet Take 40 mg by mouth daily.     polyethylene glycol powder (MIRALAX) 17 GM/SCOOP powder 1 scoop mixed  with 8 ounces of fluid Orally Once a day for 30 day(s)     Probiotic Product (ALIGN) 4 MG CAPS Take 4 mg by mouth daily.     sodium bicarbonate 650 MG tablet Take 650 mg by mouth 2 (two) times daily.     tamsulosin (FLOMAX) 0.4 MG CAPS capsule Take 1 capsule (0.4 mg total) by mouth daily. (Patient taking differently: Take 0.8 mg by mouth at bedtime.) 30 capsule    terbinafine (LAMISIL) 1 % cream Apply 1 Application topically daily.     triamcinolone ointment (KENALOG) 0.1 % Apply 1 Application topically daily as needed (rash).     insulin glargine (LANTUS) 100 UNIT/ML injection Inject 0.2 mLs (20 Units total) into the skin 2 (two) times daily. Lantus 45 units every morning, 40 units evening time, (Patient taking differently: Inject 20 Units into the skin 2 (two) times daily. Lantus 40 units every morning, 30 units evening time) 20 mL 0   No current facility-administered medications on file prior to visit.    Allergies:   Allergies  Allergen Reactions   Other Other (See Comments)    Other reaction(s): rash   Tape Rash    Other reaction(s): rash      OBJECTIVE:  Physical Exam  Vitals:   03/10/23 1326  BP: (!) 173/76  Pulse: 67  Weight: 237 lb (107.5 kg)  Height: 5\' 11"  (1.803 m)   Body mass index is 33.05 kg/m. No results found.  General: well developed, well nourished, very pleasant elderly Caucasian male, seated, in no evident distress Head: head normocephalic and atraumatic.  Neck: supple with no carotid or supraclavicular bruits Cardiovascular: regular rate and rhythm, no murmurs Musculoskeletal: no deformity Skin:  no rash/petichiae Vascular:  Normal pulses all extremities   Neurologic Exam Mental Status: Awake and fully alert.  Fluent speech and language.  Oriented to place and time. Recent memory impaired and remote memory intact. Attention span, concentration and fund of knowledge mostly appropriate during visit. Mood and affect appropriate.  Cranial Nerves:  Pupil briskly reactive to light in OD, OS pupil 1mm and nonreactive. OS severe visual loss at baseline.  Extraocular movements full without nystagmus. Visual fields intact in OD. Hearing intact. Facial sensation intact. Very slight left lower facial weakness (more noticeable when smiling).  Tongue, and palate moves normally and symmetrically.  Motor: Normal bulk and tone. Normal strength in all tested extremity muscles Sensory.: intact to touch , pinprick , position and vibratory sensation.  Coordination: Rapid alternating movements normal in all extremities. Finger-to-nose mild LUE incoordination and heel-to-shin performed accurately bilaterally. Mild postural tremor L>R UE.  No evidence of resting tremor or cogwheel rigidity Gait and Station: Arises from chair without difficulty. Stance is slightly hunched. Gait demonstrates decreased stride length and step height and mild imbalance with use of rollator walker.   Reflexes: 1+ and symmetric. Toes downgoing.         ASSESSMENT: Jatavis Malek is a 89 y.o. year old male with right precentral gyrus stroke on 10/07/2020, embolic secondary to unknown source - documented possibly in setting of A. Fib but unable to verify prior history. Not a AC candidate d/t high fall risk, cognitive impairment, lives alone and does not consistently take medications (d/t cognitive impairment) therefore no indication for prolonged cardiac monitoring for possible A. fib. Vascular risk factors include HTN, HLD, DM with neuropathy, mild cognitive impairment and prior stroke history (R cerebellum and R temporal lobe).      PLAN:  Recurrent strokes:  continue aspirin 81 mg daily for secondary stroke prevention.  Atorvastatin discontinued by cardiology due to joint pain, daughter reports satisfactory lipid panel completed by PCP (unable to view via epic).  Okay to hold off on restarting statin as long as LDL remains below goal in setting of advanced age but would  recommend restarting if indicated Discussed secondary stroke prevention measures and importance of close PCP follow up for aggressive stroke risk factor management including BP goal<130/90, HLD with LDL goal<70 and DM with A1c.<7.  I have gone over the pathophysiology of stroke, warning signs and symptoms, risk factors and their management in some detail with instructions to go to the closest emergency room for symptoms of concern.  Severe sleep apnea: HST 03/2021 severe sleep apnea with hypoxia.  CPAP titration 06/2021 transition to BiPAP.  Has been noncompliant with BiPAP use due to difficulty tolerating, he was encouraged to schedule follow-up visit with Dr. Vickey Huger for further discussion/treatment options.  Discussed importance of nightly usage as this is likely contributing to decreased energy levels as well as memory difficulties  Gait impairment: Likely multifactorial with history of prior strokes, arthritis in multiple joints, neuropathy, age and sedentary.  Limited benefit previously with PT. Discussed use of rollator walker at all times.     Doing well from stroke standpoint and risk factors are managed by PCP. He may follow up PRN, as usual for our patients who are strictly being followed for stroke. If any new neurological issues should arise, request PCP place referral for evaluation by one of our neurologists. Thank you.  CC:  PCP: Soundra Pilon, FNP    I spent 40 minutes of face-to-face and non-face-to-face time with patient and daughter.  This included previsit chart review, lab review, study review, order entry, electronic health record documentation, patient and daughter education and discussion regarding above diagnoses and treatment plan and answered all other questions to patient and daughter satisfaction  Ihor Austin, [facility]  [facility] [address] [place], [place] [address]  Phone [phone] Fax [phone] Note: This  document was prepared with digital dictation and possible smart phrase technology. Any transcriptional errors that result from this process are unintentional.

## 2023-03-30 DIAGNOSIS — L570 Actinic keratosis: Secondary | ICD-10-CM | POA: Diagnosis not present

## 2023-03-30 DIAGNOSIS — D692 Other nonthrombocytopenic purpura: Secondary | ICD-10-CM | POA: Diagnosis not present

## 2023-03-30 DIAGNOSIS — L820 Inflamed seborrheic keratosis: Secondary | ICD-10-CM | POA: Diagnosis not present

## 2023-03-30 DIAGNOSIS — D1801 Hemangioma of skin and subcutaneous tissue: Secondary | ICD-10-CM | POA: Diagnosis not present

## 2023-03-31 DIAGNOSIS — E1142 Type 2 diabetes mellitus with diabetic polyneuropathy: Secondary | ICD-10-CM | POA: Diagnosis not present

## 2023-03-31 DIAGNOSIS — R5381 Other malaise: Secondary | ICD-10-CM | POA: Diagnosis not present

## 2023-03-31 DIAGNOSIS — N184 Chronic kidney disease, stage 4 (severe): Secondary | ICD-10-CM | POA: Diagnosis not present

## 2023-03-31 DIAGNOSIS — I2510 Atherosclerotic heart disease of native coronary artery without angina pectoris: Secondary | ICD-10-CM | POA: Diagnosis not present

## 2023-03-31 DIAGNOSIS — H401130 Primary open-angle glaucoma, bilateral, stage unspecified: Secondary | ICD-10-CM | POA: Diagnosis not present

## 2023-03-31 DIAGNOSIS — I10 Essential (primary) hypertension: Secondary | ICD-10-CM | POA: Diagnosis not present

## 2023-04-02 DIAGNOSIS — N2581 Secondary hyperparathyroidism of renal origin: Secondary | ICD-10-CM | POA: Diagnosis not present

## 2023-04-02 DIAGNOSIS — E1122 Type 2 diabetes mellitus with diabetic chronic kidney disease: Secondary | ICD-10-CM | POA: Diagnosis not present

## 2023-04-02 DIAGNOSIS — H401130 Primary open-angle glaucoma, bilateral, stage unspecified: Secondary | ICD-10-CM | POA: Diagnosis not present

## 2023-04-02 DIAGNOSIS — Z9049 Acquired absence of other specified parts of digestive tract: Secondary | ICD-10-CM | POA: Diagnosis not present

## 2023-04-02 DIAGNOSIS — I440 Atrioventricular block, first degree: Secondary | ICD-10-CM | POA: Diagnosis not present

## 2023-04-02 DIAGNOSIS — Z87891 Personal history of nicotine dependence: Secondary | ICD-10-CM | POA: Diagnosis not present

## 2023-04-02 DIAGNOSIS — I495 Sick sinus syndrome: Secondary | ICD-10-CM | POA: Diagnosis not present

## 2023-04-02 DIAGNOSIS — H8110 Benign paroxysmal vertigo, unspecified ear: Secondary | ICD-10-CM | POA: Diagnosis not present

## 2023-04-02 DIAGNOSIS — Z7982 Long term (current) use of aspirin: Secondary | ICD-10-CM | POA: Diagnosis not present

## 2023-04-02 DIAGNOSIS — D631 Anemia in chronic kidney disease: Secondary | ICD-10-CM | POA: Diagnosis not present

## 2023-04-02 DIAGNOSIS — N184 Chronic kidney disease, stage 4 (severe): Secondary | ICD-10-CM | POA: Diagnosis not present

## 2023-04-02 DIAGNOSIS — I480 Paroxysmal atrial fibrillation: Secondary | ICD-10-CM | POA: Diagnosis not present

## 2023-04-02 DIAGNOSIS — K5909 Other constipation: Secondary | ICD-10-CM | POA: Diagnosis not present

## 2023-04-02 DIAGNOSIS — K219 Gastro-esophageal reflux disease without esophagitis: Secondary | ICD-10-CM | POA: Diagnosis not present

## 2023-04-02 DIAGNOSIS — M1990 Unspecified osteoarthritis, unspecified site: Secondary | ICD-10-CM | POA: Diagnosis not present

## 2023-04-02 DIAGNOSIS — M8580 Other specified disorders of bone density and structure, unspecified site: Secondary | ICD-10-CM | POA: Diagnosis not present

## 2023-04-02 DIAGNOSIS — I129 Hypertensive chronic kidney disease with stage 1 through stage 4 chronic kidney disease, or unspecified chronic kidney disease: Secondary | ICD-10-CM | POA: Diagnosis not present

## 2023-04-02 DIAGNOSIS — E1142 Type 2 diabetes mellitus with diabetic polyneuropathy: Secondary | ICD-10-CM | POA: Diagnosis not present

## 2023-04-02 DIAGNOSIS — I2510 Atherosclerotic heart disease of native coronary artery without angina pectoris: Secondary | ICD-10-CM | POA: Diagnosis not present

## 2023-04-02 DIAGNOSIS — Z794 Long term (current) use of insulin: Secondary | ICD-10-CM | POA: Diagnosis not present

## 2023-04-02 DIAGNOSIS — Z8673 Personal history of transient ischemic attack (TIA), and cerebral infarction without residual deficits: Secondary | ICD-10-CM | POA: Diagnosis not present

## 2023-04-02 DIAGNOSIS — G4733 Obstructive sleep apnea (adult) (pediatric): Secondary | ICD-10-CM | POA: Diagnosis not present

## 2023-04-02 DIAGNOSIS — E785 Hyperlipidemia, unspecified: Secondary | ICD-10-CM | POA: Diagnosis not present

## 2023-04-07 DIAGNOSIS — I480 Paroxysmal atrial fibrillation: Secondary | ICD-10-CM | POA: Diagnosis not present

## 2023-04-07 DIAGNOSIS — Z794 Long term (current) use of insulin: Secondary | ICD-10-CM | POA: Diagnosis not present

## 2023-04-07 DIAGNOSIS — H401130 Primary open-angle glaucoma, bilateral, stage unspecified: Secondary | ICD-10-CM | POA: Diagnosis not present

## 2023-04-07 DIAGNOSIS — I129 Hypertensive chronic kidney disease with stage 1 through stage 4 chronic kidney disease, or unspecified chronic kidney disease: Secondary | ICD-10-CM | POA: Diagnosis not present

## 2023-04-07 DIAGNOSIS — D631 Anemia in chronic kidney disease: Secondary | ICD-10-CM | POA: Diagnosis not present

## 2023-04-07 DIAGNOSIS — M8580 Other specified disorders of bone density and structure, unspecified site: Secondary | ICD-10-CM | POA: Diagnosis not present

## 2023-04-07 DIAGNOSIS — I440 Atrioventricular block, first degree: Secondary | ICD-10-CM | POA: Diagnosis not present

## 2023-04-07 DIAGNOSIS — I2510 Atherosclerotic heart disease of native coronary artery without angina pectoris: Secondary | ICD-10-CM | POA: Diagnosis not present

## 2023-04-07 DIAGNOSIS — I495 Sick sinus syndrome: Secondary | ICD-10-CM | POA: Diagnosis not present

## 2023-04-07 DIAGNOSIS — N184 Chronic kidney disease, stage 4 (severe): Secondary | ICD-10-CM | POA: Diagnosis not present

## 2023-04-07 DIAGNOSIS — Z9049 Acquired absence of other specified parts of digestive tract: Secondary | ICD-10-CM | POA: Diagnosis not present

## 2023-04-07 DIAGNOSIS — K5909 Other constipation: Secondary | ICD-10-CM | POA: Diagnosis not present

## 2023-04-07 DIAGNOSIS — N2581 Secondary hyperparathyroidism of renal origin: Secondary | ICD-10-CM | POA: Diagnosis not present

## 2023-04-07 DIAGNOSIS — Z8673 Personal history of transient ischemic attack (TIA), and cerebral infarction without residual deficits: Secondary | ICD-10-CM | POA: Diagnosis not present

## 2023-04-07 DIAGNOSIS — H8110 Benign paroxysmal vertigo, unspecified ear: Secondary | ICD-10-CM | POA: Diagnosis not present

## 2023-04-07 DIAGNOSIS — E785 Hyperlipidemia, unspecified: Secondary | ICD-10-CM | POA: Diagnosis not present

## 2023-04-07 DIAGNOSIS — Z87891 Personal history of nicotine dependence: Secondary | ICD-10-CM | POA: Diagnosis not present

## 2023-04-07 DIAGNOSIS — Z7982 Long term (current) use of aspirin: Secondary | ICD-10-CM | POA: Diagnosis not present

## 2023-04-07 DIAGNOSIS — E1122 Type 2 diabetes mellitus with diabetic chronic kidney disease: Secondary | ICD-10-CM | POA: Diagnosis not present

## 2023-04-07 DIAGNOSIS — M1990 Unspecified osteoarthritis, unspecified site: Secondary | ICD-10-CM | POA: Diagnosis not present

## 2023-04-07 DIAGNOSIS — G4733 Obstructive sleep apnea (adult) (pediatric): Secondary | ICD-10-CM | POA: Diagnosis not present

## 2023-04-07 DIAGNOSIS — K219 Gastro-esophageal reflux disease without esophagitis: Secondary | ICD-10-CM | POA: Diagnosis not present

## 2023-04-07 DIAGNOSIS — E1142 Type 2 diabetes mellitus with diabetic polyneuropathy: Secondary | ICD-10-CM | POA: Diagnosis not present

## 2023-04-14 DIAGNOSIS — H401130 Primary open-angle glaucoma, bilateral, stage unspecified: Secondary | ICD-10-CM | POA: Diagnosis not present

## 2023-05-06 ENCOUNTER — Ambulatory Visit: Payer: Medicare Other | Admitting: Neurology

## 2023-05-18 DIAGNOSIS — N184 Chronic kidney disease, stage 4 (severe): Secondary | ICD-10-CM | POA: Diagnosis not present

## 2023-05-28 DIAGNOSIS — E872 Acidosis, unspecified: Secondary | ICD-10-CM | POA: Diagnosis not present

## 2023-05-28 DIAGNOSIS — C642 Malignant neoplasm of left kidney, except renal pelvis: Secondary | ICD-10-CM | POA: Diagnosis not present

## 2023-05-28 DIAGNOSIS — I129 Hypertensive chronic kidney disease with stage 1 through stage 4 chronic kidney disease, or unspecified chronic kidney disease: Secondary | ICD-10-CM | POA: Diagnosis not present

## 2023-05-28 DIAGNOSIS — N2581 Secondary hyperparathyroidism of renal origin: Secondary | ICD-10-CM | POA: Diagnosis not present

## 2023-05-28 DIAGNOSIS — D631 Anemia in chronic kidney disease: Secondary | ICD-10-CM | POA: Diagnosis not present

## 2023-05-28 DIAGNOSIS — N184 Chronic kidney disease, stage 4 (severe): Secondary | ICD-10-CM | POA: Diagnosis not present

## 2024-02-28 DEATH — deceased
